# Patient Record
Sex: Female | Born: 1970 | Race: White | Hispanic: No | Smoking: Former smoker
Health system: Southern US, Community
[De-identification: ages and names within clinical notes are randomized; demographics above are authoritative.]

## PROBLEM LIST (undated history)

## (undated) DIAGNOSIS — E559 Vitamin D deficiency, unspecified: Secondary | ICD-10-CM

## (undated) DIAGNOSIS — L4050 Arthropathic psoriasis, unspecified: Secondary | ICD-10-CM

## (undated) DIAGNOSIS — M797 Fibromyalgia: Secondary | ICD-10-CM

## (undated) DIAGNOSIS — M7989 Other specified soft tissue disorders: Secondary | ICD-10-CM

## (undated) DIAGNOSIS — K449 Diaphragmatic hernia without obstruction or gangrene: Secondary | ICD-10-CM

## (undated) DIAGNOSIS — N809 Endometriosis, unspecified: Secondary | ICD-10-CM

## (undated) DIAGNOSIS — E785 Hyperlipidemia, unspecified: Secondary | ICD-10-CM

## (undated) DIAGNOSIS — I10 Essential (primary) hypertension: Secondary | ICD-10-CM

## (undated) DIAGNOSIS — K589 Irritable bowel syndrome without diarrhea: Secondary | ICD-10-CM

## (undated) DIAGNOSIS — M1990 Unspecified osteoarthritis, unspecified site: Secondary | ICD-10-CM

## (undated) DIAGNOSIS — K5190 Ulcerative colitis, unspecified, without complications: Secondary | ICD-10-CM

## (undated) DIAGNOSIS — M549 Dorsalgia, unspecified: Secondary | ICD-10-CM

## (undated) DIAGNOSIS — K219 Gastro-esophageal reflux disease without esophagitis: Secondary | ICD-10-CM

## (undated) DIAGNOSIS — J302 Other seasonal allergic rhinitis: Secondary | ICD-10-CM

## (undated) DIAGNOSIS — M2550 Pain in unspecified joint: Secondary | ICD-10-CM

## (undated) DIAGNOSIS — R112 Nausea with vomiting, unspecified: Secondary | ICD-10-CM

## (undated) DIAGNOSIS — M3500 Sicca syndrome, unspecified: Secondary | ICD-10-CM

## (undated) DIAGNOSIS — F4310 Post-traumatic stress disorder, unspecified: Secondary | ICD-10-CM

## (undated) DIAGNOSIS — H3530 Unspecified macular degeneration: Secondary | ICD-10-CM

## (undated) DIAGNOSIS — F329 Major depressive disorder, single episode, unspecified: Secondary | ICD-10-CM

## (undated) DIAGNOSIS — H269 Unspecified cataract: Secondary | ICD-10-CM

## (undated) DIAGNOSIS — M329 Systemic lupus erythematosus, unspecified: Secondary | ICD-10-CM

## (undated) DIAGNOSIS — F419 Anxiety disorder, unspecified: Secondary | ICD-10-CM

## (undated) DIAGNOSIS — T7840XA Allergy, unspecified, initial encounter: Secondary | ICD-10-CM

## (undated) DIAGNOSIS — F32A Depression, unspecified: Secondary | ICD-10-CM

## (undated) DIAGNOSIS — IMO0002 Reserved for concepts with insufficient information to code with codable children: Secondary | ICD-10-CM

## (undated) DIAGNOSIS — Z9889 Other specified postprocedural states: Secondary | ICD-10-CM

## (undated) DIAGNOSIS — D649 Anemia, unspecified: Secondary | ICD-10-CM

## (undated) DIAGNOSIS — K760 Fatty (change of) liver, not elsewhere classified: Secondary | ICD-10-CM

## (undated) HISTORY — DX: Depression, unspecified: F32.A

## (undated) HISTORY — DX: Anxiety disorder, unspecified: F41.9

## (undated) HISTORY — DX: Other specified soft tissue disorders: M79.89

## (undated) HISTORY — DX: Major depressive disorder, single episode, unspecified: F32.9

## (undated) HISTORY — DX: Other specified postprocedural states: Z98.890

## (undated) HISTORY — DX: Essential (primary) hypertension: I10

## (undated) HISTORY — DX: Fatty (change of) liver, not elsewhere classified: K76.0

## (undated) HISTORY — DX: Unspecified cataract: H26.9

## (undated) HISTORY — DX: Post-traumatic stress disorder, unspecified: F43.10

## (undated) HISTORY — DX: Vitamin D deficiency, unspecified: E55.9

## (undated) HISTORY — DX: Arthropathic psoriasis, unspecified: L40.50

## (undated) HISTORY — DX: Ulcerative colitis, unspecified, without complications: K51.90

## (undated) HISTORY — PX: MOLE REMOVAL: SHX2046

## (undated) HISTORY — DX: Other seasonal allergic rhinitis: J30.2

## (undated) HISTORY — DX: Hyperlipidemia, unspecified: E78.5

## (undated) HISTORY — DX: Dorsalgia, unspecified: M54.9

## (undated) HISTORY — DX: Diaphragmatic hernia without obstruction or gangrene: K44.9

## (undated) HISTORY — DX: Sjogren syndrome, unspecified: M35.00

## (undated) HISTORY — PX: ABDOMINAL HYSTERECTOMY: SHX81

## (undated) HISTORY — DX: Gastro-esophageal reflux disease without esophagitis: K21.9

## (undated) HISTORY — DX: Unspecified osteoarthritis, unspecified site: M19.90

## (undated) HISTORY — DX: Unspecified macular degeneration: H35.30

## (undated) HISTORY — DX: Fibromyalgia: M79.7

## (undated) HISTORY — DX: Anemia, unspecified: D64.9

## (undated) HISTORY — DX: Irritable bowel syndrome, unspecified: K58.9

## (undated) HISTORY — PX: OOPHORECTOMY: SHX86

## (undated) HISTORY — DX: Allergy, unspecified, initial encounter: T78.40XA

## (undated) HISTORY — DX: Pain in unspecified joint: M25.50

## (undated) HISTORY — DX: Nausea with vomiting, unspecified: R11.2

---

## 1998-02-17 ENCOUNTER — Ambulatory Visit (HOSPITAL_COMMUNITY): Admission: RE | Admit: 1998-02-17 | Discharge: 1998-02-17 | Payer: Self-pay | Admitting: *Deleted

## 1998-02-17 ENCOUNTER — Encounter: Payer: Self-pay | Admitting: *Deleted

## 1998-03-17 ENCOUNTER — Ambulatory Visit (HOSPITAL_COMMUNITY): Admission: RE | Admit: 1998-03-17 | Discharge: 1998-03-17 | Payer: Self-pay | Admitting: *Deleted

## 1998-03-17 ENCOUNTER — Encounter: Payer: Self-pay | Admitting: *Deleted

## 1998-05-18 ENCOUNTER — Other Ambulatory Visit: Admission: RE | Admit: 1998-05-18 | Discharge: 1998-05-18 | Payer: Self-pay | Admitting: Obstetrics and Gynecology

## 1999-06-21 ENCOUNTER — Other Ambulatory Visit: Admission: RE | Admit: 1999-06-21 | Discharge: 1999-06-21 | Payer: Self-pay | Admitting: Obstetrics and Gynecology

## 1999-08-19 ENCOUNTER — Encounter: Payer: Self-pay | Admitting: Internal Medicine

## 1999-08-19 ENCOUNTER — Emergency Department (HOSPITAL_COMMUNITY): Admission: EM | Admit: 1999-08-19 | Discharge: 1999-08-19 | Payer: Self-pay | Admitting: Emergency Medicine

## 1999-09-02 ENCOUNTER — Ambulatory Visit (HOSPITAL_COMMUNITY): Admission: RE | Admit: 1999-09-02 | Discharge: 1999-09-02 | Payer: Self-pay | Admitting: Family Medicine

## 1999-09-02 ENCOUNTER — Encounter: Payer: Self-pay | Admitting: Family Medicine

## 2000-01-22 ENCOUNTER — Other Ambulatory Visit: Admission: RE | Admit: 2000-01-22 | Discharge: 2000-01-22 | Payer: Self-pay | Admitting: Obstetrics and Gynecology

## 2000-06-30 ENCOUNTER — Emergency Department (HOSPITAL_COMMUNITY): Admission: EM | Admit: 2000-06-30 | Discharge: 2000-07-01 | Payer: Self-pay | Admitting: Internal Medicine

## 2000-11-17 ENCOUNTER — Other Ambulatory Visit: Admission: RE | Admit: 2000-11-17 | Discharge: 2000-11-17 | Payer: Self-pay | Admitting: Obstetrics and Gynecology

## 2001-12-18 ENCOUNTER — Other Ambulatory Visit: Admission: RE | Admit: 2001-12-18 | Discharge: 2001-12-18 | Payer: Self-pay | Admitting: Obstetrics and Gynecology

## 2002-12-24 ENCOUNTER — Other Ambulatory Visit: Admission: RE | Admit: 2002-12-24 | Discharge: 2002-12-24 | Payer: Self-pay | Admitting: Obstetrics and Gynecology

## 2004-01-17 ENCOUNTER — Other Ambulatory Visit: Admission: RE | Admit: 2004-01-17 | Discharge: 2004-01-17 | Payer: Self-pay | Admitting: Obstetrics and Gynecology

## 2004-11-23 ENCOUNTER — Other Ambulatory Visit: Admission: RE | Admit: 2004-11-23 | Discharge: 2004-11-23 | Payer: Self-pay | Admitting: Obstetrics and Gynecology

## 2005-05-17 ENCOUNTER — Other Ambulatory Visit: Admission: RE | Admit: 2005-05-17 | Discharge: 2005-05-17 | Payer: Self-pay | Admitting: Obstetrics and Gynecology

## 2007-04-16 HISTORY — PX: TONSILLECTOMY: SUR1361

## 2007-04-16 HISTORY — PX: TEMPOROMANDIBULAR JOINT SURGERY: SHX35

## 2008-04-14 ENCOUNTER — Ambulatory Visit (HOSPITAL_COMMUNITY): Admission: RE | Admit: 2008-04-14 | Discharge: 2008-04-14 | Payer: Self-pay | Admitting: Obstetrics and Gynecology

## 2008-04-14 ENCOUNTER — Encounter: Admission: RE | Admit: 2008-04-14 | Discharge: 2008-04-14 | Payer: Self-pay | Admitting: Obstetrics and Gynecology

## 2008-08-16 ENCOUNTER — Other Ambulatory Visit: Payer: Self-pay | Admitting: Obstetrics and Gynecology

## 2009-03-03 ENCOUNTER — Ambulatory Visit (HOSPITAL_COMMUNITY): Admission: RE | Admit: 2009-03-03 | Discharge: 2009-03-03 | Payer: Self-pay | Admitting: Internal Medicine

## 2009-07-14 HISTORY — PX: PARTIAL HYSTERECTOMY: SHX80

## 2009-07-25 ENCOUNTER — Encounter (INDEPENDENT_AMBULATORY_CARE_PROVIDER_SITE_OTHER): Payer: Self-pay | Admitting: Obstetrics and Gynecology

## 2009-07-25 ENCOUNTER — Ambulatory Visit (HOSPITAL_COMMUNITY): Admission: RE | Admit: 2009-07-25 | Discharge: 2009-07-26 | Payer: Self-pay | Admitting: Obstetrics and Gynecology

## 2009-08-12 ENCOUNTER — Observation Stay (HOSPITAL_COMMUNITY)
Admission: AD | Admit: 2009-08-12 | Discharge: 2009-08-13 | Payer: Self-pay | Source: Home / Self Care | Admitting: Obstetrics and Gynecology

## 2009-12-31 ENCOUNTER — Inpatient Hospital Stay (HOSPITAL_COMMUNITY)
Admission: AD | Admit: 2009-12-31 | Discharge: 2009-12-31 | Payer: Self-pay | Source: Home / Self Care | Admitting: Obstetrics and Gynecology

## 2010-04-13 ENCOUNTER — Encounter (INDEPENDENT_AMBULATORY_CARE_PROVIDER_SITE_OTHER): Payer: Self-pay | Admitting: Obstetrics and Gynecology

## 2010-04-13 ENCOUNTER — Ambulatory Visit (HOSPITAL_COMMUNITY)
Admission: RE | Admit: 2010-04-13 | Discharge: 2010-04-13 | Payer: Self-pay | Source: Home / Self Care | Attending: Obstetrics and Gynecology | Admitting: Obstetrics and Gynecology

## 2010-04-22 ENCOUNTER — Inpatient Hospital Stay (HOSPITAL_COMMUNITY)
Admission: AD | Admit: 2010-04-22 | Discharge: 2010-04-22 | Payer: Self-pay | Source: Home / Self Care | Attending: Obstetrics and Gynecology | Admitting: Obstetrics and Gynecology

## 2010-04-30 LAB — COMPREHENSIVE METABOLIC PANEL
ALT: 16 U/L (ref 0–35)
AST: 16 U/L (ref 0–37)
Albumin: 3.9 g/dL (ref 3.5–5.2)
Alkaline Phosphatase: 49 U/L (ref 39–117)
BUN: 8 mg/dL (ref 6–23)
CO2: 25 mEq/L (ref 19–32)
Calcium: 9.3 mg/dL (ref 8.4–10.5)
Chloride: 106 mEq/L (ref 96–112)
Creatinine, Ser: 0.5 mg/dL (ref 0.4–1.2)
GFR calc Af Amer: >60 mL/min (ref >60)
GFR calc non Af Amer: >60 mL/min (ref >60)
Glucose, Bld: 81 mg/dL (ref 70–99)
Potassium: 4.1 mEq/L (ref 3.5–5.1)
Sodium: 137 mEq/L (ref 135–145)
Total Bilirubin: 0.6 mg/dL (ref 0.3–1.2)
Total Protein: 6.5 g/dL (ref 6.0–8.3)

## 2010-04-30 LAB — URINE CULTURE
Colony Count: 15000
Culture  Setup Time: 201201082210

## 2010-04-30 LAB — URINALYSIS, ROUTINE W REFLEX MICROSCOPIC
Bilirubin Urine: NEGATIVE
Ketones, ur: NEGATIVE mg/dL
Nitrite: NEGATIVE
Protein, ur: NEGATIVE mg/dL
Specific Gravity, Urine: 1.025 (ref 1.005–1.030)
Urine Glucose, Fasting: NEGATIVE mg/dL
Urobilinogen, UA: 0.2 mg/dL (ref 0.0–1.0)
pH: 6 (ref 5.0–8.0)

## 2010-04-30 LAB — DIFFERENTIAL
Basophils Absolute: 0.2 10*3/uL — ABNORMAL HIGH (ref 0.0–0.1)
Basophils Relative: 2 % — ABNORMAL HIGH (ref 0–1)
Eosinophils Absolute: 0.3 10*3/uL (ref 0.0–0.7)
Eosinophils Relative: 3 % (ref 0–5)
Lymphocytes Relative: 22 % (ref 12–46)
Lymphs Abs: 2 10*3/uL (ref 0.7–4.0)
Monocytes Absolute: 0.5 10*3/uL (ref 0.1–1.0)
Monocytes Relative: 5 % (ref 3–12)
Neutro Abs: 6 10*3/uL (ref 1.7–7.7)
Neutrophils Relative %: 68 % (ref 43–77)

## 2010-04-30 LAB — CBC
HCT: 42.3 % (ref 36.0–46.0)
Hemoglobin: 14 g/dL (ref 12.0–15.0)
MCH: 27.5 pg (ref 26.0–34.0)
MCHC: 33.1 g/dL (ref 30.0–36.0)
MCV: 83.1 fL (ref 78.0–100.0)
Platelets: 174 10*3/uL (ref 150–400)
RBC: 5.09 MIL/uL (ref 3.87–5.11)
RDW: 13.6 % (ref 11.5–15.5)
WBC: 8.8 10*3/uL (ref 4.0–10.5)

## 2010-04-30 LAB — AMYLASE: Amylase: 89 U/L (ref 0–105)

## 2010-04-30 LAB — URINE MICROSCOPIC-ADD ON

## 2010-04-30 LAB — LIPASE, BLOOD: Lipase: 35 U/L (ref 11–59)

## 2010-06-22 ENCOUNTER — Encounter (HOSPITAL_COMMUNITY)
Admission: RE | Admit: 2010-06-22 | Discharge: 2010-06-22 | Disposition: A | Discharge status: Home / Self Care | Payer: PRIVATE HEALTH INSURANCE | Source: Ambulatory Visit | Attending: Obstetrics and Gynecology | Admitting: Obstetrics and Gynecology

## 2010-06-22 DIAGNOSIS — Z01812 Encounter for preprocedural laboratory examination: Secondary | ICD-10-CM | POA: Insufficient documentation

## 2010-06-22 LAB — BASIC METABOLIC PANEL
BUN: 10 mg/dL (ref 6–23)
CO2: 29 mEq/L (ref 19–32)
Calcium: 9.4 mg/dL (ref 8.4–10.5)
Chloride: 103 mEq/L (ref 96–112)
Creatinine, Ser: 0.72 mg/dL (ref 0.4–1.2)
GFR calc Af Amer: >60 mL/min (ref >60)
GFR calc non Af Amer: >60 mL/min (ref >60)
Glucose, Bld: 77 mg/dL (ref 70–99)
Potassium: 3.9 mEq/L (ref 3.5–5.1)
Sodium: 137 mEq/L (ref 135–145)

## 2010-06-22 LAB — CBC
HCT: 43.4 % (ref 36.0–46.0)
Hemoglobin: 14.2 g/dL (ref 12.0–15.0)
MCH: 27.7 pg (ref 26.0–34.0)
MCHC: 32.7 g/dL (ref 30.0–36.0)
MCV: 84.8 fL (ref 78.0–100.0)
Platelets: 129 10*3/uL — ABNORMAL LOW (ref 150–400)
RBC: 5.12 MIL/uL — ABNORMAL HIGH (ref 3.87–5.11)
RDW: 14 % (ref 11.5–15.5)
WBC: 6.6 10*3/uL (ref 4.0–10.5)

## 2010-06-22 LAB — SURGICAL PCR SCREEN
MRSA, PCR: NEGATIVE
Staphylococcus aureus: NEGATIVE

## 2010-06-25 ENCOUNTER — Ambulatory Visit (HOSPITAL_COMMUNITY)
Admission: RE | Admit: 2010-06-25 | Discharge: 2010-06-25 | Disposition: A | Discharge status: Home / Self Care | Payer: PRIVATE HEALTH INSURANCE | Source: Ambulatory Visit | Attending: Obstetrics and Gynecology | Admitting: Obstetrics and Gynecology

## 2010-06-25 ENCOUNTER — Other Ambulatory Visit: Payer: Self-pay | Admitting: Obstetrics and Gynecology

## 2010-06-25 DIAGNOSIS — N750 Cyst of Bartholin's gland: Secondary | ICD-10-CM | POA: Insufficient documentation

## 2010-06-25 DIAGNOSIS — Z90710 Acquired absence of both cervix and uterus: Secondary | ICD-10-CM | POA: Insufficient documentation

## 2010-06-25 DIAGNOSIS — N949 Unspecified condition associated with female genital organs and menstrual cycle: Secondary | ICD-10-CM | POA: Insufficient documentation

## 2010-06-25 DIAGNOSIS — N803 Endometriosis of pelvic peritoneum, unspecified: Secondary | ICD-10-CM | POA: Insufficient documentation

## 2010-06-25 DIAGNOSIS — N8300 Follicular cyst of ovary, unspecified side: Secondary | ICD-10-CM | POA: Insufficient documentation

## 2010-06-25 LAB — CBC
MCH: 27.2 pg (ref 26.0–34.0)
MCHC: 32.2 g/dL (ref 30.0–36.0)
Platelets: 149 10*3/uL — ABNORMAL LOW (ref 150–400)
RBC: 4.72 MIL/uL (ref 3.87–5.11)
RDW: 13.5 % (ref 11.5–15.5)
RDW: 13.6 % (ref 11.5–15.5)
WBC: 9.2 10*3/uL (ref 4.0–10.5)

## 2010-06-25 LAB — SURGICAL PCR SCREEN
MRSA, PCR: NEGATIVE
Staphylococcus aureus: NEGATIVE

## 2010-06-25 NOTE — H&P (Addendum)
NAME:  Melinda Porter, Melinda Porter                ACCOUNT NO.:  [id number]  MEDICAL RECORD NO.:  [id number]           PATIENT TYPE:  O  LOCATION:  [facility]                           FACILITY:  [facility]  PHYSICIAN:  Ralene Bathe. Matthew Saras, M.D.DATE OF BIRTH:  [birth year]  DATE OF ADMISSION:  06/22/2010 DATE OF DISCHARGE:  06/22/2010                             HISTORY & PHYSICAL   CHIEF COMPLAINT:  Cyst and chronic pelvic pain.  HISTORY OF PRESENT ILLNESS:  A 40 year old, G1, P1.  The patient underwent LAVH on July 25, 2009, for menorrhagia and dysmenorrhea with a history of fibroids.  She did well initially after surgery, but began to experience ongoing significant pelvic pain and followup evaluation by ultrasound in an attempt at pain management.  She underwent a repeat laparoscopic exam on April 13, 2010, that showed bilaterally adnexal incision, new pelvic endometriosis.  She underwent RSO and coagulation of circumferential vaginal cuff endometriosis.  Initially, she did make her improvement but had substantial narcotic requirements after that surgery and continued to have substantial pelvic pain.  We got her approved for Lupron.  During the course of the Lupron treatment, she continued to have substantial pelvic pain with inability to manage her pain with nonsteroidal medicines alone, again requiring significant narcotics for pain management.  Within this past month due to the severity of her pain, she underwent followup CT scan and has had normal findings.  We discussed several options with her including followup laparoscopy with removal of remaining ovary, continuing on with Lupron, referral to pain clinic for alternative pain management and to wean off narcotics.  She prefers to have removal of her remaining ovary, although she understands she may still require other treatments of pelvic pain if it persists other than narcotic management.  In the interim, she has persistent Bartholin cyst and wants  to have marsupialization of the Bartholin cyst at this time.  PAST MEDICAL HISTORY:  ALLERGIES:  CODEINE, SULFA, ASPIRIN, PENICILLIN, MORPHINE, PERCOCET.  CURRENT MEDICATIONS: 1. Tylox p.r.n. pain. 2. Motrin 800 mg 1 p.o. q.12 h. p.r.n. pain. 3. Wellbutrin. 4. Klonopin.  SURGICAL HISTORY:  Tonsillectomy, LAVH, RSO with laparoscopy and Cesarean section.  REVIEW OF SYSTEMS:  Significant for history of thyroid disease, IBS, and prior history of LEEPs.  FAMILY HISTORY:  Notable for hypertension, diabetes, arthritis, osteoarthrosis, anemia, hiatal hernia, asthma, heart disease, and migraine headache.  PHYSICAL EXAMINATION:  VITAL SIGNS:  Temperature 98.2, blood pressure 120/78. HEENT:  Unremarkable. NECK:  Supple without masses. LUNGS:  Clear. CARDIOVASCULAR:  Regular rate and rhythm without murmurs, rubs, gallops. No AFib. CHEST:  Without masses. ABDOMEN:  Soft, flat, nondistended. PELVIC:  Normal external genitalia.  Vaginal cuff clear.  On bimanual exam, no significant tenderness or masses noted. EXTREMITIES:  Unremarkable. NEUROLOGIC:  Unremarkable.  IMPRESSION: 1. Persistent pelvic pain, prior history of laparoscopic-assisted     vaginal hysterectomy and right salpingo-oophorectomy on separate     occasions with findings of endometriosis and adnexal adhesions. 2. Recurrent Bartholin cyst, on the right.  PLAN:  Marsupialization of right Bartholin cyst, laparoscopy with BSO, remaining with left ovary.  This procedure including risk  related to bleeding, infection, adjacent organ injury, the possible need to complete the surgery with open technique or persistence of pelvic pain necessitating other management strategies have all been reviewed with her.  All of her questions were answered.     Evani Shrider M. Matthew Saras, [name]     RMH/MEDQ  D:  06/23/2010  T:  06/23/2010  Job:  [id number]  Electronically Signed by Molli Posey [name] on 07/02/2010 10:16:16 PM

## 2010-06-28 LAB — URINE MICROSCOPIC-ADD ON

## 2010-06-28 LAB — URINALYSIS, ROUTINE W REFLEX MICROSCOPIC
Bilirubin Urine: NEGATIVE
Ketones, ur: NEGATIVE mg/dL
Nitrite: NEGATIVE
Specific Gravity, Urine: 1.025 (ref 1.005–1.030)
Urobilinogen, UA: 0.2 mg/dL (ref 0.0–1.0)
pH: 6 (ref 5.0–8.0)

## 2010-06-28 LAB — CBC
Hemoglobin: 13.2 g/dL (ref 12.0–15.0)
MCH: 29.3 pg (ref 26.0–34.0)
MCHC: 33.8 g/dL (ref 30.0–36.0)
MCV: 86.8 fL (ref 78.0–100.0)
RBC: 4.5 MIL/uL (ref 3.87–5.11)

## 2010-06-28 LAB — URINE CULTURE: Colony Count: 15000

## 2010-07-01 ENCOUNTER — Observation Stay (HOSPITAL_COMMUNITY)
Admission: AD | Admit: 2010-07-01 | Discharge: 2010-07-01 | Disposition: A | Discharge status: Home / Self Care | Payer: PRIVATE HEALTH INSURANCE | Source: Ambulatory Visit | Attending: Obstetrics and Gynecology | Admitting: Obstetrics and Gynecology

## 2010-07-01 ENCOUNTER — Inpatient Hospital Stay (HOSPITAL_COMMUNITY): Payer: PRIVATE HEALTH INSURANCE

## 2010-07-01 DIAGNOSIS — K5900 Constipation, unspecified: Secondary | ICD-10-CM

## 2010-07-01 DIAGNOSIS — Z90710 Acquired absence of both cervix and uterus: Secondary | ICD-10-CM | POA: Insufficient documentation

## 2010-07-01 DIAGNOSIS — R109 Unspecified abdominal pain: Secondary | ICD-10-CM

## 2010-07-01 DIAGNOSIS — N949 Unspecified condition associated with female genital organs and menstrual cycle: Secondary | ICD-10-CM

## 2010-07-01 LAB — URINE MICROSCOPIC-ADD ON

## 2010-07-01 LAB — COMPREHENSIVE METABOLIC PANEL
AST: 27 U/L (ref 0–37)
BUN: 17 mg/dL (ref 6–23)
CO2: 23 mEq/L (ref 19–32)
Calcium: 9.7 mg/dL (ref 8.4–10.5)
Chloride: 108 mEq/L (ref 96–112)
Creatinine, Ser: 0.62 mg/dL (ref 0.4–1.2)
GFR calc non Af Amer: >60 mL/min (ref >60)
Glucose, Bld: 137 mg/dL — ABNORMAL HIGH (ref 70–99)
Total Bilirubin: 0.7 mg/dL (ref 0.3–1.2)

## 2010-07-01 LAB — URINALYSIS, ROUTINE W REFLEX MICROSCOPIC
Nitrite: NEGATIVE
Protein, ur: NEGATIVE mg/dL
Specific Gravity, Urine: 1.025 (ref 1.005–1.030)
Urobilinogen, UA: 1 mg/dL (ref 0.0–1.0)

## 2010-07-01 LAB — CBC
Hemoglobin: 14.3 g/dL (ref 12.0–15.0)
MCHC: 33.3 g/dL (ref 30.0–36.0)
RBC: 5.09 MIL/uL (ref 3.87–5.11)
WBC: 13.2 10*3/uL — ABNORMAL HIGH (ref 4.0–10.5)

## 2010-07-02 NOTE — Op Note (Addendum)
NAME:  Melinda Porter, BUTTERBAUGH                ACCOUNT NO.:  [id number]  MEDICAL RECORD NO.:  [id number]           PATIENT TYPE:  O  LOCATION:  [facility]                           FACILITY:  [facility]  PHYSICIAN:  Ralene Bathe. Matthew Saras, M.D.DATE OF BIRTH:  1970/06/01  DATE OF PROCEDURE: DATE OF DISCHARGE:  06/22/2010                              OPERATIVE REPORT   PREOPERATIVE DIAGNOSES: 1. Chronic right Bartholin cyst/abscess. 2. Acute on chronic pelvic pain. 3. Pelvic endometriosis.  POSTOPERATIVE DIAGNOSES: 1. Chronic right Bartholin cyst/abscess. 2. Acute on chronic pelvic pain. 3. Pelvic endometriosis.  PROCEDURES: 1. Marsupialization, right Bartholin cyst/abscess. 2. Diagnostic laparoscopy with left salpingo-oophorectomy.  SURGEON:  Ralene Bathe. Matthew Saras, [name]  ANESTHESIA:  General endotracheal.  COMPLICATIONS:  None.  DRAINS:  Foley catheter.  BLOOD LOSS:  Minimal.  PROCEDURE AND FINDINGS:  The patient was taken to the operating room.After an adequate level of general endotracheal anesthesia was obtained with the patient's legs in stirrups, the abdomen, perineum, and vagina were prepped and draped in usual manner for laparoscopy.  The bladder was drained, UA carried out, and uterus surgically absent.  The right ovary was surgically absent.  I could not palpate the left ovary.  There was a 3-cm enlarged right Bartholin cyst noted.  The Bartholin cyst was infiltrated with Xylocaine.  A 11-blade was used to make an incision, drainage of mucopurulent material.  The cyst wall was explored.  There were no loculations.  Marsupialization was carried out then by suturing the cyst wall to the surrounding skin in a circumferential manner with 2- 0 Vicryl sutures.  Attention directed to the abdomen where the subumbilical area was infiltrated with 0.25% Marcaine plain.  A small incision was made, and the Veress needle was introduced without difficulty.  It's intra-abdominal position was verified by  pressure and water testing.  After a 3 L pneumoperitoneum was created, laparoscopic trocar and sleeve were then introduced without difficulty.  Three fingerbreadths above the symphysis in the midline, a 10/11 trocar was then inserted under direct visualization.  The patient was then placed in Trendelenburg and pelvic findings as follows.  Upper abdomen unremarkable.  The uterus and right ovary were surgically absent.  There were some brownish implants of endometriosis along the vaginal cuff area as noted on prior laparoscopy.  There was a clear walled cyst on the left side, but no other significant findings involving the left tube and ovary.  A grasper was then used to place the left tube and ovary on traction to the midline.  The course of the left pelvic ureter was noted to be well below.  The EnSeal device was then used to coagulate and divide the infundibulopelvic ligament close to the ovary until it was released.  This area was hemostatic.  This was morcellated into two to three pieces for removal through the lower incision.  The operative site was inspected and noted to be hemostatic. Prior to closure, sponge, needle, and instrument counts reported as correct x2.  The lower fascia was closed with 2-0 Vicryl running suture and a subcuticular 4-0 on the skin; 4-0 subcuticular on the subumbilical incision.  She tolerated this well, went to recovery room in good condition.     Katrece Roediger M. Matthew Saras, [name]     RMH/MEDQ  D:  06/25/2010  T:  06/26/2010  Job:  [id number]  Electronically Signed by Molli Posey [name] on 07/02/2010 10:16:22 PM

## 2010-07-03 LAB — CBC
HCT: 30.3 % — ABNORMAL LOW (ref 36.0–46.0)
Hemoglobin: 10.4 g/dL — ABNORMAL LOW (ref 12.0–15.0)
MCHC: 33.8 g/dL (ref 30.0–36.0)
MCV: 84 fL (ref 78.0–100.0)
MCV: 83.2 fL (ref 78.0–100.0)
Platelets: 266 10*3/uL (ref 150–400)
RBC: 3.69 MIL/uL — ABNORMAL LOW (ref 3.87–5.11)
RBC: 3.64 MIL/uL — ABNORMAL LOW (ref 3.87–5.11)
WBC: 8.4 10*3/uL (ref 4.0–10.5)
WBC: 8.5 10*3/uL (ref 4.0–10.5)

## 2010-07-04 LAB — COMPREHENSIVE METABOLIC PANEL
BUN: 9 mg/dL (ref 6–23)
Calcium: 9.6 mg/dL (ref 8.4–10.5)
Glucose, Bld: 85 mg/dL (ref 70–99)
Total Protein: 7.1 g/dL (ref 6.0–8.3)

## 2010-07-04 LAB — CBC
HCT: 44 % (ref 36.0–46.0)
Hemoglobin: 14.5 g/dL (ref 12.0–15.0)
MCHC: 33 g/dL (ref 30.0–36.0)
Platelets: 160 10*3/uL (ref 150–400)
RBC: 3.19 MIL/uL — ABNORMAL LOW (ref 3.87–5.11)
RDW: 13.9 % (ref 11.5–15.5)
WBC: 11.6 10*3/uL — ABNORMAL HIGH (ref 4.0–10.5)

## 2010-07-04 LAB — TYPE AND SCREEN
ABO/RH(D): O POS
Antibody Screen: NEGATIVE

## 2010-07-24 LAB — COMPREHENSIVE METABOLIC PANEL
ALT: 15 U/L (ref 0–35)
Alkaline Phosphatase: 43 U/L (ref 39–117)
CO2: 28 mEq/L (ref 19–32)
Glucose, Bld: 92 mg/dL (ref 70–99)
Potassium: 3.9 mEq/L (ref 3.5–5.1)
Sodium: 137 mEq/L (ref 135–145)
Total Protein: 6.3 g/dL (ref 6.0–8.3)

## 2010-07-24 LAB — CBC
Hemoglobin: 14.8 g/dL (ref 12.0–15.0)
RBC: 5.01 MIL/uL (ref 3.87–5.11)
RDW: 13.7 % (ref 11.5–15.5)
WBC: 7.6 10*3/uL (ref 4.0–10.5)

## 2010-07-24 LAB — ABO/RH: ABO/RH(D): O POS

## 2010-07-24 LAB — PREGNANCY, URINE: Preg Test, Ur: NEGATIVE

## 2010-07-24 LAB — TYPE AND SCREEN: ABO/RH(D): O POS

## 2010-08-31 NOTE — H&P (Signed)
NAME:  Melinda Porter, THAXTON                ACCOUNT NO.:  [id number]   MEDICAL RECORD NO.:  [id number]          PATIENT TYPE:  AMB   LOCATION:                                [facility]ILITY:  [facility]   PHYSICIAN:  Ralene Bathe. Matthew Saras, M.D.DATE OF BIRTH:  30-Sep-1970   DATE OF ADMISSION:  08/17/2008  DATE OF DISCHARGE:                              HISTORY & PHYSICAL   DATE OF SCHEDULED SURGERY:  Aug 17, 2008.   MEDICAL DOCTOR:  Emeline General. White, MD   CHIEF COMPLAINT:  Menorrhagia, dysmenorrhea.   HISTORY OF PRESENT ILLNESS:  A 40 year old G1, P1 using condoms for  contraception who has a 6-49-monthhistory of menorrhagia and worsening  dysmenorrhea.  Ultrasound evaluation in our office December 2009 showed  a fundal fibroid 3.5 x 4 x 3.5 cm.  Due to continued problems with pain  and bleeding, she presents at this time for LAVH.  This procedure  including risks related to bleeding, infection, adjacent organ injury,  the possible need for open additional surgery all reviewed with her  which she understands and accepts.   Last Pap April 2010 was normal.   PAST MEDICAL HISTORY:   ALLERGIES:  CODEINE, SULFA, ASPIRIN, PENICILLIN, MORPHINE, and PERCOCET.   CURRENT MEDICATIONS:  Klonopin 0.5 mg p.r.n.   PRIOR SURGERIES:  Tonsillectomy, TMJ surgery, cesarean section.   REVIEW OF SYSTEMS:  Significant for history of thyroid disease, IBS,  prior history of abnormal Pap smear treated by a LEEP in 2006.   FAMILY HISTORY:  Significant for history of hypertension, diabetes,  arthritis, osteoporosis, anemia, hiatal hernia, asthma, heart disease,  and migraine headache.   SOCIAL HISTORY:  Denies drug or cigarette use.  She does state a  positive alcohol use 1-2 drinks per week.   PHYSICAL EXAMINATION:  VITAL SIGNS:  Temp 98.2, BP 118/98.  HEENT:  Unremarkable.  NECK:  Supple without masses.  LUNGS:  Clear.  CARDIOVASCULAR:  Regular rate and rhythm without murmurs, rubs, or  gallops.  No AFib.  BREASTS:   Without masses.  ABDOMEN:  Soft, flat, and nontender.  PELVIC:  Normal external genitalia.  Vagina swabs clear.  Uterus  midposition, upper limit of normal size.  Adnexa negative.  EXTREMITIES:  Unremarkable.  NEUROLOGIC:  Unremarkable.   IMPRESSION:  1. Dysmenorrhea with menorrhagia.  2. Leiomyoma.  3. History of loop electrocautery excision procedure 2006, subsequent      normal Pap smears.   PLAN:  LAVH procedure and risks reviewed as above.      [name] [name]  Electronically Signed     RMH/MEDQ  D:  08/10/2008  T:  08/11/2008  Job:  [id number]

## 2011-01-15 ENCOUNTER — Other Ambulatory Visit: Payer: Self-pay | Admitting: Family Medicine

## 2011-01-15 DIAGNOSIS — R51 Headache: Secondary | ICD-10-CM

## 2011-01-17 ENCOUNTER — Other Ambulatory Visit: Payer: PRIVATE HEALTH INSURANCE

## 2011-09-06 ENCOUNTER — Other Ambulatory Visit: Payer: Self-pay | Admitting: Obstetrics and Gynecology

## 2011-09-06 DIAGNOSIS — N6320 Unspecified lump in the left breast, unspecified quadrant: Secondary | ICD-10-CM

## 2011-09-10 ENCOUNTER — Ambulatory Visit (INDEPENDENT_AMBULATORY_CARE_PROVIDER_SITE_OTHER): Payer: Self-pay | Admitting: *Deleted

## 2011-09-10 VITALS — BP 135/92 | HR 96 | Temp 97.0°F | Ht 64.0 in | Wt 211.5 lb

## 2011-09-10 DIAGNOSIS — Z1239 Encounter for other screening for malignant neoplasm of breast: Secondary | ICD-10-CM

## 2011-09-10 DIAGNOSIS — N63 Unspecified lump in unspecified breast: Secondary | ICD-10-CM

## 2011-09-10 DIAGNOSIS — N6325 Unspecified lump in the left breast, overlapping quadrants: Secondary | ICD-10-CM

## 2011-09-10 DIAGNOSIS — N6324 Unspecified lump in the left breast, lower inner quadrant: Secondary | ICD-10-CM

## 2011-09-10 NOTE — Patient Instructions (Signed)
Taught patient how to perform BSE. Patient did not need a Pap smear today due to last Pap smear was May 2012 per patient and since patient has a history of a hysterectomy. Let patient know she will not need any further Pap smears since has a history of a hysterectomy. Patient referred to the [facility] for Diagnostic Mammogram and possible left breast ultrasound. Appointment scheduled for Monday, September 16, 2011 at 1330.  Patient aware of appointment and will be there. Let patient know will follow up with her within the next couple weeks with results. Patient verbalized understanding.

## 2011-09-10 NOTE — Progress Notes (Signed)
Complaints of left breast lumps that are painful with touch x 2 months  Pap Smear:    Pap smear not performed today. Per patient last Pap smear was May 2012 and normal. Per patient she has had one abnormal Pap smear in 2006 that required a LEEP. Patient has a history of a partial hysterectomy in April 2011, removal of 1 ovary in December 2011 and removal of the other ovary April 2012 due to fibroids and endometriosis. Patient stated she has had normal Pap smears since LEEP. No Pap smear results in EPIC.  Physical exam: Breasts Right breast larger than left breast normal per patient normal for her. No skin abnormalities bilateral breasts. No nipple retraction bilateral breasts. No nipple discharge bilateral breasts. No lymphadenopathy. No lumps palpated right breast. Palpated two small moveable lumps in the left breast at 6 o'clock 3 cm from the areola and at 7 o'clock 3.5 cm from the areola. Patient complained of tenderness when palpated lumps. Patient referred to the [facility] for Diagnostic Mammogram and possible left breast ultrasound. Appointment scheduled for Monday, September 16, 2011 at 1330.        Pelvic/Bimanual No Pap smear completed today since last Pap smear was May 2012 and patient has a history of a complete hysterectomy. Pap smear not indicated per BCCCP guidelines.

## 2011-09-16 ENCOUNTER — Ambulatory Visit
Admission: RE | Admit: 2011-09-16 | Discharge: 2011-09-16 | Disposition: A | Discharge status: Home / Self Care | Payer: No Typology Code available for payment source | Source: Ambulatory Visit | Attending: Obstetrics and Gynecology | Admitting: Obstetrics and Gynecology

## 2011-09-16 DIAGNOSIS — N6320 Unspecified lump in the left breast, unspecified quadrant: Secondary | ICD-10-CM

## 2011-11-28 ENCOUNTER — Emergency Department (HOSPITAL_BASED_OUTPATIENT_CLINIC_OR_DEPARTMENT_OTHER): Payer: Self-pay

## 2011-11-28 ENCOUNTER — Encounter (HOSPITAL_BASED_OUTPATIENT_CLINIC_OR_DEPARTMENT_OTHER): Payer: Self-pay | Admitting: *Deleted

## 2011-11-28 ENCOUNTER — Emergency Department (HOSPITAL_BASED_OUTPATIENT_CLINIC_OR_DEPARTMENT_OTHER)
Admission: EM | Admit: 2011-11-28 | Discharge: 2011-11-28 | Disposition: A | Discharge status: Home / Self Care | Payer: Self-pay | Attending: Emergency Medicine | Admitting: Emergency Medicine

## 2011-11-28 DIAGNOSIS — F329 Major depressive disorder, single episode, unspecified: Secondary | ICD-10-CM | POA: Insufficient documentation

## 2011-11-28 DIAGNOSIS — Z79899 Other long term (current) drug therapy: Secondary | ICD-10-CM | POA: Insufficient documentation

## 2011-11-28 DIAGNOSIS — R109 Unspecified abdominal pain: Secondary | ICD-10-CM

## 2011-11-28 DIAGNOSIS — M549 Dorsalgia, unspecified: Secondary | ICD-10-CM | POA: Insufficient documentation

## 2011-11-28 DIAGNOSIS — F3289 Other specified depressive episodes: Secondary | ICD-10-CM | POA: Insufficient documentation

## 2011-11-28 DIAGNOSIS — I10 Essential (primary) hypertension: Secondary | ICD-10-CM | POA: Insufficient documentation

## 2011-11-28 DIAGNOSIS — K219 Gastro-esophageal reflux disease without esophagitis: Secondary | ICD-10-CM

## 2011-11-28 DIAGNOSIS — R079 Chest pain, unspecified: Secondary | ICD-10-CM | POA: Insufficient documentation

## 2011-11-28 HISTORY — DX: Endometriosis, unspecified: N80.9

## 2011-11-28 LAB — COMPREHENSIVE METABOLIC PANEL
AST: 15 U/L (ref 0–37)
BUN: 8 mg/dL (ref 6–23)
CO2: 28 mEq/L (ref 19–32)
Chloride: 103 mEq/L (ref 96–112)
Creatinine, Ser: 0.6 mg/dL (ref 0.50–1.10)
GFR calc Af Amer: >90 mL/min (ref >90)
GFR calc non Af Amer: >90 mL/min (ref >90)
Glucose, Bld: 92 mg/dL (ref 70–99)
Total Bilirubin: 0.4 mg/dL (ref 0.3–1.2)

## 2011-11-28 LAB — CBC WITH DIFFERENTIAL/PLATELET
Basophils Absolute: 0.2 10*3/uL — ABNORMAL HIGH (ref 0.0–0.1)
HCT: 37.8 % (ref 36.0–46.0)
Hemoglobin: 12.5 g/dL (ref 12.0–15.0)
Lymphocytes Relative: 22 % (ref 12–46)
Lymphs Abs: 2.3 10*3/uL (ref 0.7–4.0)
MCV: 79.9 fL (ref 78.0–100.0)
Monocytes Absolute: 0.6 10*3/uL (ref 0.1–1.0)
Monocytes Relative: 6 % (ref 3–12)
Neutro Abs: 6.8 10*3/uL (ref 1.7–7.7)
RBC: 4.73 MIL/uL (ref 3.87–5.11)
WBC: 10.1 10*3/uL (ref 4.0–10.5)

## 2011-11-28 LAB — URINALYSIS, ROUTINE W REFLEX MICROSCOPIC
Glucose, UA: NEGATIVE mg/dL
Hgb urine dipstick: NEGATIVE
Ketones, ur: NEGATIVE mg/dL
Leukocytes, UA: NEGATIVE
Protein, ur: NEGATIVE mg/dL

## 2011-11-28 MED ORDER — GI COCKTAIL ~~LOC~~
30.0000 mL | Freq: Once | ORAL | Status: AC
  Administered 2011-11-28: 30 mL via ORAL
  Filled 2011-11-28: qty 30

## 2011-11-28 MED ORDER — OXYCODONE-ACETAMINOPHEN 5-325 MG PO TABS
2.0000 | ORAL_TABLET | Freq: Once | ORAL | Status: AC
  Administered 2011-11-28: 2 via ORAL
  Filled 2011-11-28 (×2): qty 2

## 2011-11-28 MED ORDER — SODIUM CHLORIDE 0.9 % IV BOLUS (SEPSIS)
1000.0000 mL | Freq: Once | INTRAVENOUS | Status: AC
  Administered 2011-11-28: 1000 mL via INTRAVENOUS

## 2011-11-28 MED ORDER — ONDANSETRON 8 MG PO TBDP: 8.0000 mg | ORAL_TABLET | Freq: Three times a day (TID) | ORAL | Status: AC | PRN

## 2011-11-28 MED ORDER — OXYCODONE-ACETAMINOPHEN 5-325 MG PO TABS: ORAL_TABLET | ORAL | Status: DC

## 2011-11-28 MED ORDER — HYDROMORPHONE HCL PF 1 MG/ML IJ SOLN
1.0000 mg | Freq: Once | INTRAMUSCULAR | Status: AC
  Administered 2011-11-28: 1 mg via INTRAVENOUS
  Filled 2011-11-28: qty 1

## 2011-11-28 MED ORDER — ONDANSETRON HCL 4 MG/2ML IJ SOLN
4.0000 mg | Freq: Once | INTRAMUSCULAR | Status: AC
  Administered 2011-11-28: 4 mg via INTRAVENOUS
  Filled 2011-11-28: qty 2

## 2011-11-28 NOTE — ED Notes (Signed)
Patient states she has had gallbladder attack off and on since Aug 27, 3011.  States she has had an increase in weight, pain under her right breast radiating around chest, nausea and diarrhea.

## 2011-11-28 NOTE — ED Provider Notes (Signed)
History     CSN: [id number]  Arrival date & time 11/28/11  1249   First MD Initiated Contact with Patient 11/28/11 1301      Chief Complaint  Patient presents with  . Abdominal Pain    (Consider location/radiation/quality/duration/timing/severity/associated sxs/prior treatment) HPI Melinda Porter is a 41 year old female who presents today complaining of several months of pain in her right upper quadrant of the abdomen that now radiates all the way around her back. Patient reports that she has not seen a physician for this given that she has no insurance currently. She endorses nausea but denies any vomiting. Patient also endorses having loose bowel movements up to 8 or 9 times a day. She denies any blood in her stools. Patient has history of problems with her gallbladder in the past and reports that this feels similarly. Past surgical history has included cesarean sections as well as total abdominal hysterectomy. Patient has not had a cholecystectomy. She reports that following initial partial hysterectomy patient had oophorrectomy for increasing frequency of large ovarian cysts at that time was discovered to have signs of endometriosis. She reports that her pain is not consistent with prior episodes of endometriosis. Patient also has history of small bowel obstruction secondary to ileus her medications but this was managed medically. Patient denies any urinary symptoms or vaginal discharge at this time. She denies any back pain aside from radiation from the right upper quadrant. She reports she has history of elevated liver enzymes but denies known definite diagnosis of liver disease or pancreatitis. Patient also noted that she had had some chest pain this mainly appeared to be radiating from her epigastrium and burning in character. She did note occasional sharp sensation as well the. There are no other associated or modifying factors. Past Medical History  Diagnosis Date  . PTSD (post-traumatic  stress disorder)   . Hypertension   . Depression   . Hyperlipemia   . Endometriosis     Past Surgical History  Procedure Date  . Partial hysterectomy   . Tonsillectomy   . Cesarean section   . Temporomandibular joint surgery     right side  . Oophorectomy     bilateral     Family History  Problem Relation Age of Onset  . Diabetes Maternal Grandfather   . Heart disease Maternal Grandfather   . Hypertension Maternal Grandfather     History  Substance Use Topics  . Smoking status: Never Smoker   . Smokeless tobacco: Never Used  . Alcohol Use: No    OB History    Grav Para Term Preterm Abortions TAB SAB Ect Mult Living   2 1   1 1    1       Review of Systems  Constitutional: Positive for appetite change.  HENT: Negative.   Eyes: Negative.   Respiratory: Negative.   Cardiovascular: Positive for chest pain.  Gastrointestinal: Positive for nausea, abdominal pain and diarrhea.  Genitourinary: Negative.   Musculoskeletal: Negative.   Skin: Negative.   Neurological: Negative.   Hematological: Negative.   Psychiatric/Behavioral: Negative.   All other systems reviewed and are negative.    Allergies  Codeine; Morphine and related; and Sulfa antibiotics  Home Medications   Current Outpatient Rx  Name Route Sig Dispense Refill  . CLONAZEPAM 1 MG PO TABS Oral Take 1 mg by mouth 2 (two) times daily as needed.    . DULOXETINE HCL 60 MG PO CPEP Oral Take 60 mg by mouth daily.    [name]  ESTRADIOL 2 MG PO TABS Oral Take 2 mg by mouth 2 (two) times daily.    [name] LAMOTRIGINE 200 MG PO TABS Oral Take 200 mg by mouth 2 (two) times daily.    [name] OMEPRAZOLE 20 MG PO CPDR Oral Take 20 mg by mouth every morning.    [name] PROPRANOLOL HCL 10 MG PO TABS Oral Take 10 mg by mouth as needed.    [name] QUETIAPINE FUMARATE 50 MG PO TABS Oral Take 50 mg by mouth at bedtime.      BP 127/88  Pulse 74  Temp 98.7 F (37.1 C) (Oral)  Resp 18  Ht 5' 4"  (1.626 m)  Wt 225 lb (102.059 kg)  BMI 38.62  kg/m2  SpO2 97%  LMP 06/23/2009  Physical Exam  Nursing note and vitals reviewed. GEN: Well-developed, well-nourished female in no distress HEENT: Atraumatic, normocephalic. Oropharynx clear without erythema EYES: PERRLA BL, no scleral icterus. NECK: Trachea midline, no meningismus CV: regular rate and rhythm. No murmurs, rubs, or gallops PULM: No respiratory distress.  No crackles, wheezes, or rales. GI: soft, tender to palpation in the epigastrium and right upper quadrant. Patient is also tender to palpation the left lower quadrant but reports she is always tender here since her oophorectomy. No guarding, rebound. + bowel sounds  GU: deferred Neuro: cranial nerves grossly 2-12 intact, no abnormalities of strength or sensation, A and O x 3 MSK: Patient moves all 4 extremities symmetrically, no deformity, edema, or injury noted Skin: No rashes petechiae, purpura, or jaundice Psych: no abnormality of mood   ED Course  Procedures (including critical care time)  Indication: RUQ pain Please note this EKG was reviewed extemporaneously by myself.   Date: 11/28/2011  Rate: 67  Rhythm: normal sinus rhythm  QRS Axis: normal  Intervals: normal  ST/T Wave abnormalities: normal  Conduction Disutrbances:none  Narrative Interpretation:   Old EKG Reviewed: unchanged      Labs Reviewed  CBC WITH DIFFERENTIAL - Abnormal; Notable for the following:    RDW 15.6 (*)     Basophils Relative 2 (*)     Basophils Absolute 0.2 (*)     All other components within normal limits  COMPREHENSIVE METABOLIC PANEL - Abnormal; Notable for the following:    Albumin 3.2 (*)     All other components within normal limits  URINALYSIS, ROUTINE W REFLEX MICROSCOPIC - Abnormal; Notable for the following:    APPearance CLOUDY (*)     All other components within normal limits  LIPASE, BLOOD  LACTIC ACID, PLASMA  HELICOBACTER PYLORI ABS-IGG+IGA, BLD   Dg Chest 2 View  11/28/2011  *RADIOLOGY REPORT*   Clinical Data: 42 year old female with chest pain.  CHEST - 2 VIEW  Comparison: 03/03/2009 chest CT  Findings: The cardiomediastinal silhouette is unremarkable. The lungs are clear. There is no evidence of focal airspace disease, pulmonary edema, suspicious pulmonary nodule/mass, pleural effusion, or pneumothorax. No acute bony abnormalities are identified.  IMPRESSION: No evidence of active cardiopulmonary disease.  Original Report Authenticated By: Lura Em, [name]   US Abdomen Complete  11/28/2011  *RADIOLOGY REPORT*  Clinical Data:  Abdominal pain.  COMPLETE ABDOMINAL ULTRASOUND  Comparison:  CT abdomen and pelvis 04/22/2010.  Findings:  Gallbladder:  No gallstones, gallbladder wall thickening, or pericholecystic fluid.  Common bile duct:  Measures 0.2 cm.  Liver:  No focal lesion identified.  Within normal limits in parenchymal echogenicity.  IVC:  Appears normal.  Pancreas:  No focal abnormality seen.  Spleen:  Measures 7.2 cm and appears normal.  Right Kidney:  Measures 9.9 cm and appears normal without stone, mass or hydronephrosis.  Left Kidney:  Measures 11.0 cm and appears normal without stone, mass or hydronephrosis.  Abdominal aorta:  No aneurysm identified.  IMPRESSION: Negative abdominal ultrasound.  Original Report Authenticated By: Arvid Right. D'ALESSIO, [name]     1. Abdominal pain   2. GERD (gastroesophageal reflux disease)       MDM  Patient was evaluated for myself. She had complete laboratory workup for her, pain as well as appropriate workup for chest pain given her age and risk factors. Chest x-ray and EKG were both unremarkable. Patient had normal CBC, CMP, lipase, urinalysis, lactate. Patient also had a completely normal abdominal ultrasound with no evidence of gallbladder disease whatsoever. Workup today essentially ruled out causes of abdominal pain including cholecystitis, urinary tract infection, biliary colic, pancreatitis, and gastroenteritis. Gastritis versus peptic  ulcer disease still seem the likely cause of the patient's symptoms. H. pylori testing was sent today. Patient is already on medications for reflux on daily basis. Patient has not seen a gastroenterologist and was given referral information to the on-call physician. She was also told that since she is followed by [facility] she can contact them for follow-up as well. Patient was discharged with pain and nausea meds in good condition.         Chauncy Passy, MD 11/28/11 1606

## 2011-12-03 ENCOUNTER — Other Ambulatory Visit (HOSPITAL_COMMUNITY): Payer: Self-pay | Admitting: Family Medicine

## 2011-12-03 DIAGNOSIS — R1011 Right upper quadrant pain: Secondary | ICD-10-CM

## 2011-12-06 ENCOUNTER — Encounter (HOSPITAL_COMMUNITY)
Admission: RE | Admit: 2011-12-06 | Discharge: 2011-12-06 | Disposition: A | Discharge status: Home / Self Care | Payer: Self-pay | Source: Ambulatory Visit | Attending: Family Medicine | Admitting: Family Medicine

## 2011-12-06 ENCOUNTER — Encounter (HOSPITAL_COMMUNITY): Payer: Self-pay

## 2011-12-06 DIAGNOSIS — R1011 Right upper quadrant pain: Secondary | ICD-10-CM | POA: Insufficient documentation

## 2011-12-06 MED ORDER — TECHNETIUM TC 99M MEBROFENIN IV KIT
5.5000 | PACK | Freq: Once | INTRAVENOUS | Status: AC | PRN
Start: 2011-12-06 — End: 2011-12-06
  Administered 2011-12-06: 5.5 via INTRAVENOUS

## 2012-02-27 ENCOUNTER — Ambulatory Visit (HOSPITAL_COMMUNITY)
Admission: RE | Admit: 2012-02-27 | Discharge: 2012-02-27 | Disposition: A | Discharge status: Home / Self Care | Payer: Self-pay | Source: Ambulatory Visit | Attending: Family Medicine | Admitting: Family Medicine

## 2012-02-27 ENCOUNTER — Other Ambulatory Visit (HOSPITAL_COMMUNITY): Payer: Self-pay | Admitting: Family Medicine

## 2012-02-27 DIAGNOSIS — R609 Edema, unspecified: Secondary | ICD-10-CM

## 2012-02-27 DIAGNOSIS — R0602 Shortness of breath: Secondary | ICD-10-CM | POA: Insufficient documentation

## 2012-04-01 ENCOUNTER — Encounter (HOSPITAL_COMMUNITY): Payer: Self-pay | Admitting: Emergency Medicine

## 2012-04-01 ENCOUNTER — Emergency Department (HOSPITAL_COMMUNITY)
Admission: EM | Admit: 2012-04-01 | Discharge: 2012-04-01 | Disposition: A | Discharge status: Home / Self Care | Payer: Self-pay | Attending: Emergency Medicine | Admitting: Emergency Medicine

## 2012-04-01 DIAGNOSIS — Z79899 Other long term (current) drug therapy: Secondary | ICD-10-CM | POA: Insufficient documentation

## 2012-04-01 DIAGNOSIS — E785 Hyperlipidemia, unspecified: Secondary | ICD-10-CM | POA: Insufficient documentation

## 2012-04-01 DIAGNOSIS — I10 Essential (primary) hypertension: Secondary | ICD-10-CM | POA: Insufficient documentation

## 2012-04-01 DIAGNOSIS — G8929 Other chronic pain: Secondary | ICD-10-CM | POA: Insufficient documentation

## 2012-04-01 DIAGNOSIS — F3289 Other specified depressive episodes: Secondary | ICD-10-CM | POA: Insufficient documentation

## 2012-04-01 DIAGNOSIS — F329 Major depressive disorder, single episode, unspecified: Secondary | ICD-10-CM | POA: Insufficient documentation

## 2012-04-01 DIAGNOSIS — Z8742 Personal history of other diseases of the female genital tract: Secondary | ICD-10-CM | POA: Insufficient documentation

## 2012-04-01 DIAGNOSIS — Z8659 Personal history of other mental and behavioral disorders: Secondary | ICD-10-CM | POA: Insufficient documentation

## 2012-04-01 DIAGNOSIS — M79609 Pain in unspecified limb: Secondary | ICD-10-CM | POA: Insufficient documentation

## 2012-04-01 LAB — COMPREHENSIVE METABOLIC PANEL
ALT: 31 U/L (ref 0–35)
AST: 27 U/L (ref 0–37)
Albumin: 3.9 g/dL (ref 3.5–5.2)
Alkaline Phosphatase: 83 U/L (ref 39–117)
BUN: 15 mg/dL (ref 6–23)
CO2: 23 mEq/L (ref 19–32)
Calcium: 10.1 mg/dL (ref 8.4–10.5)
Chloride: 101 mEq/L (ref 96–112)
Creatinine, Ser: 0.69 mg/dL (ref 0.50–1.10)
GFR calc Af Amer: >90 mL/min (ref >90)
GFR calc non Af Amer: >90 mL/min (ref >90)
Glucose, Bld: 91 mg/dL (ref 70–99)
Potassium: 3.2 mEq/L — ABNORMAL LOW (ref 3.5–5.1)
Sodium: 138 mEq/L (ref 135–145)
Total Bilirubin: 0.3 mg/dL (ref 0.3–1.2)
Total Protein: 7 g/dL (ref 6.0–8.3)

## 2012-04-01 LAB — CBC
HCT: 38.4 % (ref 36.0–46.0)
Hemoglobin: 12.4 g/dL (ref 12.0–15.0)
MCH: 25.4 pg — ABNORMAL LOW (ref 26.0–34.0)
MCHC: 32.3 g/dL (ref 30.0–36.0)
MCV: 78.5 fL (ref 78.0–100.0)
Platelets: 185 10*3/uL (ref 150–400)
RBC: 4.89 MIL/uL (ref 3.87–5.11)
RDW: 15.3 % (ref 11.5–15.5)
WBC: 9.1 10*3/uL (ref 4.0–10.5)

## 2012-04-01 MED ORDER — HYDROCODONE-ACETAMINOPHEN 5-325 MG PO TABS
1.0000 | ORAL_TABLET | Freq: Once | ORAL | Status: AC
  Administered 2012-04-01: 1 via ORAL
  Filled 2012-04-01: qty 1

## 2012-04-01 MED ORDER — OXYCODONE-ACETAMINOPHEN 5-325 MG PO TABS: 1.0000 | ORAL_TABLET | Freq: Four times a day (QID) | ORAL | Status: DC | PRN

## 2012-04-01 MED ORDER — PREDNISONE 50 MG PO TABS: 50.0000 mg | ORAL_TABLET | Freq: Every day | ORAL | Status: DC

## 2012-04-01 NOTE — ED Notes (Signed)
Has been checked for rheumatod arthritis in nov it was neg she has seen a dr for all this and was sent to other dr

## 2012-04-01 NOTE — ED Provider Notes (Signed)
History     CSN: [id number]  Arrival date & time 04/01/12  1457   First MD Initiated Contact with Patient 04/01/12 2014      No chief complaint on file.   (Consider location/radiation/quality/duration/timing/severity/associated sxs/prior treatment) HPI The patient presents to the ED with chronic pain.  She reports bilateral LE and bilateral UE pain.  She denies trauma to the area.  Denies joint pain and states it is "from my toes to my hips".  She reports an appointment today with Dr. Lenna Gilford, a rheumatologist,  But her appointment was canceled due to her lack of insurance and the assumptions she did not want to pay for encounter and testing.  Her mother stated she wanted answers now and she brought her to the ED.[name]  Patient denies fevers, chest pain, shortness breath, nausea, vomiting, diarrhea, headache, weakness, or visual changes.  Patient does state that she does get some tingling in her hands and feet.  Past Medical History  Diagnosis Date  . PTSD (post-traumatic stress disorder)   . Hypertension   . Depression   . Hyperlipemia   . Endometriosis     Past Surgical History  Procedure Date  . Partial hysterectomy   . Tonsillectomy   . Cesarean section   . Temporomandibular joint surgery     right side  . Oophorectomy     bilateral     Family History  Problem Relation Age of Onset  . Diabetes Maternal Grandfather   . Heart disease Maternal Grandfather   . Hypertension Maternal Grandfather     History  Substance Use Topics  . Smoking status: Never Smoker   . Smokeless tobacco: Never Used  . Alcohol Use: No    OB History    Grav Para Term Preterm Abortions TAB SAB Ect Mult Living   2 1   1 1    1       Review of Systems All other systems negative except as documented in the HPI. All pertinent positives and negatives as reviewed in the HPI.   Allergies  Codeine; Morphine and related; and Sulfa antibiotics  Home Medications   Current Outpatient Rx  Name   Route  Sig  Dispense  Refill  . CLONAZEPAM 1 MG PO TABS   Oral   Take 1 mg by mouth 2 (two) times daily as needed.         . DULOXETINE HCL 60 MG PO CPEP   Oral   Take 60 mg by mouth daily.         [name] ESTRADIOL 2 MG PO TABS   Oral   Take 2 mg by mouth 2 (two) times daily.         [name] LAMOTRIGINE 200 MG PO TABS   Oral   Take 200 mg by mouth 2 (two) times daily.         [name] OMEPRAZOLE 20 MG PO CPDR   Oral   Take 20 mg by mouth every morning.         . OXYCODONE-ACETAMINOPHEN 5-325 MG PO TABS      Take 1-2 tabs by mouth every 6 hours when necessary pain.   30 tablet   0   . PROPRANOLOL HCL 10 MG PO TABS   Oral   Take 10 mg by mouth as needed.         [name] QUETIAPINE FUMARATE 50 MG PO TABS   Oral   Take 50 mg by mouth at bedtime.  BP 129/94  Pulse 94  Temp 98.1 F (36.7 C) (Oral)  SpO2 99%  LMP 06/23/2009  Physical Exam  Constitutional: She is oriented to person, place, and time. She appears well-developed and well-nourished. She is cooperative. She does not appear ill.  HENT:  Head: Normocephalic and atraumatic.  Mouth/Throat: Oropharynx is clear and moist.  Eyes: Pupils are equal, round, and reactive to light.  Neck: Normal range of motion. Neck supple.  Cardiovascular: Normal rate and regular rhythm.  Exam reveals distant heart sounds.   Pulmonary/Chest: Effort normal and breath sounds normal. She has no wheezes. She has no rhonchi. She has no rales.  Abdominal: Soft. Normal appearance and bowel sounds are normal. There is tenderness in the epigastric area.  Musculoskeletal:       No appreciable swelling.  No bony tenderness. Normal UE strength and LE strength.   Neurological: She is alert and oriented to person, place, and time. She has normal strength and normal reflexes. She displays normal reflexes. She exhibits normal muscle tone. Coordination normal.  Skin: Skin is warm and dry. No rash noted.     ED Course  Procedures (including  critical care time)   Patient, is advised of her testing results, and she's also advised to followup with her doctor for further recheck and evaluation.  I advised her to call her doctor about rescheduling the rheumatological appointment.  Patient, states that she agrees with the plan and voice understanding and apologized for the delay in her care due to laboratory error.   [place], PA-C 04/01/12 2257

## 2012-04-01 NOTE — ED Notes (Signed)
Bilateral leg pain and tingling x 2 months also has slurred speech for  2 months also  Had a dr appointment today but they changed it w/out telling her so she came here. Pt able to walk well into triage w/o assist

## 2012-04-02 NOTE — ED Provider Notes (Signed)
Medical screening examination/treatment/procedure(s) were performed by non-physician practitioner and as supervising physician I was immediately available for consultation/collaboration.   Charles B. Karle Starch, MD 04/02/12 1505

## 2012-10-25 ENCOUNTER — Emergency Department (HOSPITAL_COMMUNITY)
Admission: EM | Admit: 2012-10-25 | Discharge: 2012-10-25 | Disposition: A | Discharge status: Home / Self Care | Payer: No Typology Code available for payment source | Attending: Emergency Medicine | Admitting: Emergency Medicine

## 2012-10-25 ENCOUNTER — Emergency Department (HOSPITAL_COMMUNITY): Payer: No Typology Code available for payment source

## 2012-10-25 DIAGNOSIS — Y9269 Other specified industrial and construction area as the place of occurrence of the external cause: Secondary | ICD-10-CM | POA: Insufficient documentation

## 2012-10-25 DIAGNOSIS — Y939 Activity, unspecified: Secondary | ICD-10-CM | POA: Insufficient documentation

## 2012-10-25 DIAGNOSIS — M546 Pain in thoracic spine: Secondary | ICD-10-CM

## 2012-10-25 DIAGNOSIS — Z8742 Personal history of other diseases of the female genital tract: Secondary | ICD-10-CM | POA: Insufficient documentation

## 2012-10-25 DIAGNOSIS — Z8659 Personal history of other mental and behavioral disorders: Secondary | ICD-10-CM | POA: Insufficient documentation

## 2012-10-25 DIAGNOSIS — IMO0002 Reserved for concepts with insufficient information to code with codable children: Secondary | ICD-10-CM | POA: Insufficient documentation

## 2012-10-25 DIAGNOSIS — Z862 Personal history of diseases of the blood and blood-forming organs and certain disorders involving the immune mechanism: Secondary | ICD-10-CM | POA: Insufficient documentation

## 2012-10-25 DIAGNOSIS — Z79899 Other long term (current) drug therapy: Secondary | ICD-10-CM | POA: Insufficient documentation

## 2012-10-25 DIAGNOSIS — F3289 Other specified depressive episodes: Secondary | ICD-10-CM | POA: Insufficient documentation

## 2012-10-25 DIAGNOSIS — S0990XA Unspecified injury of head, initial encounter: Secondary | ICD-10-CM | POA: Insufficient documentation

## 2012-10-25 DIAGNOSIS — Z8639 Personal history of other endocrine, nutritional and metabolic disease: Secondary | ICD-10-CM | POA: Insufficient documentation

## 2012-10-25 DIAGNOSIS — M545 Low back pain, unspecified: Secondary | ICD-10-CM

## 2012-10-25 DIAGNOSIS — Z880 Allergy status to penicillin: Secondary | ICD-10-CM | POA: Insufficient documentation

## 2012-10-25 DIAGNOSIS — I10 Essential (primary) hypertension: Secondary | ICD-10-CM | POA: Insufficient documentation

## 2012-10-25 DIAGNOSIS — S298XXA Other specified injuries of thorax, initial encounter: Secondary | ICD-10-CM | POA: Insufficient documentation

## 2012-10-25 DIAGNOSIS — F329 Major depressive disorder, single episode, unspecified: Secondary | ICD-10-CM | POA: Insufficient documentation

## 2012-10-25 MED ORDER — HYDROMORPHONE HCL PF 1 MG/ML IJ SOLN
1.0000 mg | Freq: Once | INTRAMUSCULAR | Status: AC
  Administered 2012-10-25: 1 mg via INTRAVENOUS
  Filled 2012-10-25: qty 1

## 2012-10-25 MED ORDER — METAXALONE 800 MG PO TABS: 800.0000 mg | ORAL_TABLET | Freq: Three times a day (TID) | ORAL | Status: DC

## 2012-10-25 MED ORDER — SODIUM CHLORIDE 0.9 % IV SOLN: Freq: Once | INTRAVENOUS | Status: DC

## 2012-10-25 MED ORDER — ONDANSETRON HCL 4 MG/2ML IJ SOLN: 4.0000 mg | Freq: Once | INTRAMUSCULAR | Status: DC

## 2012-10-25 MED ORDER — KETOROLAC TROMETHAMINE 30 MG/ML IJ SOLN
30.0000 mg | Freq: Once | INTRAMUSCULAR | Status: AC
  Administered 2012-10-25: 30 mg via INTRAVENOUS
  Filled 2012-10-25: qty 1

## 2012-10-25 MED ORDER — OXYCODONE-ACETAMINOPHEN 5-325 MG PO TABS: 2.0000 | ORAL_TABLET | Freq: Four times a day (QID) | ORAL | Status: DC | PRN

## 2012-10-25 MED ORDER — ONDANSETRON 8 MG PO TBDP
8.0000 mg | ORAL_TABLET | Freq: Once | ORAL | Status: AC
  Administered 2012-10-25: 8 mg via ORAL
  Filled 2012-10-25: qty 1

## 2012-10-25 MED ORDER — DIAZEPAM 5 MG/ML IJ SOLN
5.0000 mg | Freq: Once | INTRAMUSCULAR | Status: AC
  Administered 2012-10-25: 5 mg via INTRAVENOUS
  Filled 2012-10-25: qty 2

## 2012-10-25 MED ORDER — OXYCODONE-ACETAMINOPHEN 5-325 MG PO TABS
2.0000 | ORAL_TABLET | Freq: Once | ORAL | Status: AC
  Administered 2012-10-25: 2 via ORAL
  Filled 2012-10-25: qty 2

## 2012-10-25 NOTE — ED Notes (Signed)
Pt is able to ambulate without difficulty. Pt states she is ready to go home
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
## 2012-10-25 NOTE — ED Notes (Signed)
Pt cleared from back board.

## 2012-10-25 NOTE — ED Notes (Signed)
Pt on phone
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
## 2012-10-25 NOTE — ED Notes (Signed)
Pt. Ambulated down hall and back to her room without difficulty. Pt. Gait steady on her feet while ambulating.Pt. States she is dizzy and her back hurts.

## 2012-10-28 NOTE — ED Provider Notes (Signed)
Medical screening examination/treatment/procedure(s) were performed by non-physician practitioner and as supervising physician I was immediately available for consultation/collaboration.   Wandra Arthurs, MD 10/28/12 [phone]

## 2014-02-14 ENCOUNTER — Encounter (HOSPITAL_COMMUNITY): Payer: Self-pay | Admitting: Emergency Medicine

## 2014-02-22 ENCOUNTER — Encounter: Payer: Self-pay | Admitting: Gastroenterology

## 2014-03-02 ENCOUNTER — Ambulatory Visit: Payer: Self-pay | Admitting: Gastroenterology

## 2014-03-08 ENCOUNTER — Ambulatory Visit (INDEPENDENT_AMBULATORY_CARE_PROVIDER_SITE_OTHER): Payer: Medicaid Other | Admitting: Gastroenterology

## 2014-03-08 ENCOUNTER — Encounter: Payer: Self-pay | Admitting: Gastroenterology

## 2014-03-08 VITALS — BP 120/80 | HR 104 | Ht 63.5 in | Wt 227.0 lb

## 2014-03-08 DIAGNOSIS — R142 Eructation: Secondary | ICD-10-CM

## 2014-03-08 DIAGNOSIS — R1013 Epigastric pain: Secondary | ICD-10-CM

## 2014-03-08 DIAGNOSIS — R110 Nausea: Secondary | ICD-10-CM

## 2014-03-08 NOTE — Progress Notes (Signed)
Agree with initial assessment and plans

## 2014-03-08 NOTE — Patient Instructions (Signed)
You have been scheduled for an endoscopy. Please follow written instructions given to you at your visit today. If you use inhalers (even only as needed), please bring them with you on the day of your procedure.

## 2014-03-08 NOTE — Progress Notes (Signed)
03/08/2014 Melinda Porter [id number] 10/14/1970   HISTORY OF PRESENT ILLNESS:  This is a 43 year old female who is new to our practice and was referred by her PCP for evaluation of epigastric abdominal pain and belching.  Patient says that these symptoms have been present for years.  Has pain in her epigastrium all the time, but says that when she bends over she feels like something "pops out" or "flips" in her upper abdomen and then she has to "push it back in".  She says that it hurts when this happens and scares her.  She is concerned about a hiatal hernia.  Also has nausea, belching, and upper abdominal bloating.  Has been on prilosec for years, but says that it wasn't helping any of her symptoms.  Was diagnosed with Hpylori by her PCP and is currently being treated with Prevpak, which she will complete tomorrow.  Says that she's had some diarrhea for the past couple of days, which she contributes to a side effect of the antibiotics.  She was given a prescription for Nexium to start taking once she finishes her Prevpak as well.  She admits to taking 2 Ibuprofen daily for several years.  Recent CBC and CMP unremarkable.     Past Medical History  Diagnosis Date  . PTSD (post-traumatic stress disorder)   . Hypertension   . Depression   . Hyperlipemia   . Endometriosis   . Anxiety   . DM (diabetes mellitus)    Past Surgical History  Procedure Laterality Date  . Partial hysterectomy  07/2009  . Tonsillectomy  2009  . Cesarean section  1997  . Temporomandibular joint surgery  2009    right side  . Oophorectomy Bilateral     reports that she quit smoking about 15 years ago. Her smoking use included Cigarettes. She smoked 0.00 packs per day. She has never used smokeless tobacco. She reports that she does not drink alcohol or use illicit drugs. family history includes Colon cancer in her maternal uncle; Colon polyps in her maternal grandmother; Diabetes in her maternal grandfather; Heart  disease in her maternal grandfather; Hypertension in her maternal grandfather; Liver disease in her father. Allergies  Allergen Reactions  . Penicillins   . Codeine Rash  . Morphine And Related Rash  . Sulfa Antibiotics Rash      Outpatient Encounter Prescriptions as of 03/08/2014  Medication Sig  . amoxicillin-clarithromycin-lansoprazole (PREVPAC) combo pack Take by mouth 2 (two) times daily. Follow package directions.  . clonazePAM (KLONOPIN) 1 MG tablet Take 1 mg by mouth 4 (four) times daily as needed. For anxiety  . escitalopram (LEXAPRO) 20 MG tablet Take 20 mg by mouth at bedtime.  . gabapentin (NEURONTIN) 100 MG capsule Take 100 mg by mouth 3 (three) times daily.  . hydrocortisone cream 1 % Apply 1 application topically 2 (two) times daily as needed. For itching  . ibuprofen (ADVIL,MOTRIN) 800 MG tablet Take 800 mg by mouth 2 (two) times daily.  [name] triamterene-hydrochlorothiazide (MAXZIDE-25) 37.5-25 MG per tablet Take 1 tablet by mouth daily.  . [DISCONTINUED] metaxalone (SKELAXIN) 800 MG tablet Take 1 tablet (800 mg total) by mouth 3 (three) times daily.  . [DISCONTINUED] naproxen sodium (ANAPROX) 220 MG tablet Take 220 mg by mouth 2 (two) times daily as needed. For pain  . [DISCONTINUED] oxyCODONE-acetaminophen (PERCOCET/ROXICET) 5-325 MG per tablet Take 2 tablets by mouth every 6 (six) hours as needed for pain.  . [DISCONTINUED] PROPRANOLOL HCL PO  Take by mouth.     REVIEW OF SYSTEMS  : All other systems reviewed and negative except where noted in the History of Present Illness.   PHYSICAL EXAM: BP 120/80 mmHg  Pulse 104  Ht 5' 3.5" (1.613 m)  Wt 227 lb (102.967 kg)  BMI 39.58 kg/m2  LMP 06/23/2009 General: Well developed white female in no acute distress Head: Normocephalic and atraumatic Eyes:  Sclerae anicteric, conjunctiva pink. Ears: Normal auditory acuity Lungs: Clear throughout to auscultation Heart: Regular rate and rhythm Abdomen: Soft, non-distended.   Normal bowel sounds.  Epigastric TTP without R/R/G. Musculoskeletal: Symmetrical with no gross deformities  Skin: No lesions on visible extremities Extremities: No edema  Neurological: Alert oriented x 4, grossly non-focal Psychological:  Alert and cooperative. Normal mood and affect  ASSESSMENT AND PLAN: -43 year old female with complaints of epigastric abdominal pain, belching, upper abdominal bloating, nausea, and recent diagnosis of Hpylori.  She will complete her Hpylori treatment of Prevpak, which she will finish tomorrow, 11/25.  Then she will start the Nexium that she was prescribed by her PCP.  We will schedule for EGD for further evaluation to rule out ulcer disease, etc.  She is at risk for ulcer disease with daily NSAID use and the Hpylori infection.  The risks, benefits, and alternatives were discussed with the patient and she consents to proceed.

## 2014-03-22 ENCOUNTER — Encounter: Payer: Self-pay | Admitting: Gastroenterology

## 2014-04-13 ENCOUNTER — Ambulatory Visit (INDEPENDENT_AMBULATORY_CARE_PROVIDER_SITE_OTHER): Payer: Medicaid Other | Admitting: Internal Medicine

## 2014-04-13 VITALS — BP 124/68 | HR 99 | Temp 98.2°F | Resp 12 | Ht 64.0 in | Wt 232.0 lb

## 2014-04-13 DIAGNOSIS — E162 Hypoglycemia, unspecified: Secondary | ICD-10-CM

## 2014-04-13 NOTE — Progress Notes (Signed)
Patient ID: Melinda Porter, female   DOB: Feb 13, 1971, 43 y.o.   MRN: [id number]  HPI: Melinda Porter is a 43 y.o.-year-old female, referred by Katheran Awe, NP, in consultation for hypoglycemia.  She has had hypoglycemic sxs for the last 15 years. They were happening during the night >> she tells me she now knows how to avoid them >> eats PB before bedtime. Avoids sodas. The episodes were happening every day, now she tells me she knows how to avoid them >> more seldom. - + vision - blurry - + tremors - + palpitations - + sensation of passing out - has HAs after the episodes - + anxiety  Lowest CBG reading: 20 (26 per PCP note)! 1 mo ago (in the evening). She could climb stairs to her kitchen to get something sweet after this reading. Other than this episode: CBG 40-60 lowest. In am: 60's lowest.  Has glucose tablets with her all the time >> usually took 1 tab >> she was told to take 4 at the time.  She now eats little meals, frequently.  No fasting hypoglycemia but starts to feel hypoglycemic <2h hours after she eats. Usually b/w B and L or right after lunch.  She was on Prednisone for a long time for Achilles tendonitis and bone spurs >> 2 steroid injections >> then Prednisone - all in 01/23/2014 >> gained 15 lbs since then.  Last HbA1c: 02/18/2014: HbA1c 5.9%  - all glucoses reviewed are normal: 01/14/2014: Glu 94 07/03/2013: Glu 97 Lab Results  Component Value Date   GLUCOSE 91 04/01/2012   GLUCOSE 92 11/28/2011   GLUCOSE 137* 07/01/2010   GLUCOSE 77 06/22/2010   GLUCOSE 81 04/22/2010   GLUCOSE 85 07/24/2009   GLUCOSE 92 08/16/2008   No meds with hypoglycemia side effects on her med list.  Pt's meals are: - Breakfast: 1 cup of coffee with creamer, no sugar; grits or boiled eggs. No juices. - Lunch: sandwich or leftovers - Dinner: lean meat with vegetables - Snacks: 2  - apples or PB  She has not seen nutrition in the past.  She used to exercise every day >> had a  smoothie right after this >> no lows.  - no CKD, last BUN/creatinine:  Lab Results  Component Value Date   BUN 15 04/01/2012   CREATININE 0.69 04/01/2012   Other labs: 01/14/2014: TSH 2.098; Lipids: 213/383/35/101; BUN/Cr 13/0.82; AST/ALT 19/18; Glu 94 03/15/2014,  4 pm: cortisol 3.9  I reviewed her chart and she also has a history of hysterectomy and BSO. She has HAs every day >> recent CT head negative. R eye vision poor >> saw ophthalmologist. 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 Testosterone was high in 06/2013.  + DM in GF.  She also has a h/o HTN, HL. She also  has fibromyalgia.   ROS: Constitutional: + weight gain, + fatigue, + subjective hyperthermia Eyes: + blurry vision, no xerophthalmia ENT: no sore throat, no nodules palpated in throat, no dysphagia/odynophagia, no hoarseness Cardiovascular: no CP/SOB/palpitations/+ hands and feet swelling Respiratory: no cough/SOB Gastrointestinal: no N/V/D/C/+ acid reflux Musculoskeletal: + both: muscle/joint aches Skin: + rash, easy bruising, stretch markes Neurological: no tremors/numbness/tingling/dizziness, + HA Psychiatric: + depression/+ anxiety + low libido  Past Medical History  Diagnosis Date  . PTSD (post-traumatic stress disorder)   . Hypertension   . Depression   . Hyperlipemia   . Endometriosis   .  Anxiety   . DM (diabetes mellitus)    Past Surgical History  Procedure Laterality Date  . Partial hysterectomy  07/2009  . Tonsillectomy  2009  . Cesarean section  1997  . Temporomandibular joint surgery  2009    right side  . Oophorectomy Bilateral    History   Social History  . Marital Status: Legally Separated    Spouse Name: N/A    Number of Children: 1   Occupational History  . work from home [employer] Topics  . Smoking status: Former Smoker    Types: Cigarettes    Quit date: 1999  . Smokeless  tobacco: Never Used  . Alcohol Use: No  . Drug Use: No  . Sexual Activity:    Partners: Male    Birth Control/ Protection: Surgical   Other Topics Concern  . Not on file   Social History Narrative   Current Outpatient Prescriptions on File Prior to Visit  Medication Sig Dispense Refill  . clonazePAM (KLONOPIN) 1 MG tablet Take 1 mg by mouth 4 (four) times daily as needed. For anxiety    . escitalopram (LEXAPRO) 20 MG tablet Take 20 mg by mouth at bedtime.    . gabapentin (NEURONTIN) 100 MG capsule Take 100 mg by mouth 3 (three) times daily.    . hydrocortisone cream 1 % Apply 1 application topically 2 (two) times daily as needed. For itching    . ibuprofen (ADVIL,MOTRIN) 800 MG tablet Take 800 mg by mouth 3 (three) times daily.     [name] triamterene-hydrochlorothiazide (MAXZIDE-25) 37.5-25 MG per tablet Take 1 tablet by mouth daily.    [name] amoxicillin-clarithromycin-lansoprazole (PREVPAC) combo pack Take by mouth 2 (two) times daily. Follow package directions.     No current facility-administered medications on file prior to visit.   Allergies  Allergen Reactions  . Penicillins   . Codeine Rash  . Morphine And Related Rash  . Sulfa Antibiotics Rash   Family History  Problem Relation Age of Onset  . Diabetes Maternal Grandfather   . Heart disease Maternal Grandfather   . Hypertension Maternal Grandfather   . Colon cancer Maternal Uncle     great uncle  . Colon polyps Maternal Grandmother   . Liver disease Father     agent orange   PE: BP 124/68 mmHg  Pulse 99  Temp(Src) 98.2 F (36.8 C) (Oral)  Resp 12  Ht 5' 4"  (1.626 m)  Wt 232 lb (105.235 kg)  BMI 39.80 kg/m2  SpO2 96%  LMP 06/23/2009 Wt Readings from Last 3 Encounters:  04/13/14 232 lb (105.235 kg)  03/08/14 227 lb (102.967 kg)  10/25/12 213 lb (96.616 kg)   Constitutional: obese, in NAD Eyes: PERRLA, EOMI, no exophthalmos ENT: moist mucous membranes, no thyromegaly, no cervical  lymphadenopathy Cardiovascular: RRR, No MRG Respiratory: CTA B Gastrointestinal: abdomen soft, NT, ND, BS+ Musculoskeletal: no deformities, strength intact in all 4 Skin: moist, warm, no rashes Neurological: no tremor with outstretched hands, DTR normal in all 4  ASSESSMENT: 1. Postprandial Hypoglycemia  PLAN:  1. Patient with history of what appears as postprandial) hypoglycemia, with a very low CBG (20 or 26! Per her meter read >> however, she was able to go up stairs and get something sweet to eat, which makes me think her actual sugar may have been higher) and CBG levels out of proportion compared with her symptoms. She describes hypoglycemic sxs even in the 60s. She has a sensation that  she would pass out, and also has anxiety, tremors, headaches associated with the episodes. She changed her diet and eats more frequently and smaller meals, omits sodas or tea; has glucose tablets with her; has fast carbs on her nightstand; so intuitively she is doing exactly what is recommended in these situations. - I had a long discussion with the patient about what reactive hypoglycemia means, and the fact that in patients without diabetes, the CBG threshold of 70 does not apply. Therefore, a sugar in the 60s is perfectly normal, and it can be even lower (40s) than that especially in young women and athletes.  - We discussed about what the notion of "sugar crash" means and that it can be a normal reaction to a high glycemic index food.  - In pts with insulin resistance, for example prediabetic pts (and her HbA1c was 5.9%, in the prediabetic range), there can be a mismatch between the sugar increase after a meal and pancreatic insulin production. Insulin is secreted in the circulation to late and in too large quantities >> hypoglycemia.  - we discussed at length about diet (including examples) and the importance of avoiding high Glycemic Index foods - given web link for reference.  - I advised her to continue  to work with nutrition to improve her diet to avoid reactive hypoglycemia; I will refer her to nutrition for further diet help - I also advised her to go to the nearest lab (have somebody drive her) if sugars are 50 or below with the following lab orders to be done if her sugars are low: Orders Placed This Encounter  . Glucose, Random  . Proinsulin/insulin ratio  . Insulin, random  . C-peptide  . Beta-hydroxybutyric acid  . Sulfonylurea Hypoglycemics Panel, blood  - since she was on steroids and recently stopped >> will check for adrenal insufficiency: cosyntropin stim test >> she will come back for this  - will see her back in 4 mo >> if she does not feel better after more changes in her diet >> may need Acarbose before b'fast and lunch, since these are the problematic meals.  - she will continue to check her sugars 3x a day until then  Patient Instructions   Please come back at 8 am, fasting, for a cortisol stimulation test >> plan to be here for 1 hour.  Please review the following website - stay with the lower glycemic load foods: Www.glycemicindex.com  Have 3 meals a day + 2-3 snacks.  Eat as much fruit and veggies as you can. Berries, pears, apples, peaches, apricots - are the best.  Do not drink liquids with a meal, separate them by at least 30 min.   Start the meal with protein and fat and end with carbs.  Continue not to drink Gatorade/regular sodas/regular tea as they have a lot of sugar.  Carry a snack with you everywhere. Best - to contain ~15 g of carbs.  I will refer you to nutrition.   Please schedule a return appt in 4 mo.  - time spent with the patient: 1 hour, of which >50% was spent in obtaining information about her symptoms, reviewing her previous labs, evaluations, and treatments, counseling her about her condition (please see the discussed topics above), and developing a plan to further investigate it. She had a number of questions which I  addressed.  Component     Latest Ref Rng 04/22/2014 04/22/2014         7:58 AM 10:04 AM  Cortisol, Plasma      9.8 26.5  No sign of adrenal insufficiency.

## 2014-04-13 NOTE — Patient Instructions (Addendum)
  Please come back at 8 am, fasting, for a cortisol stimulation test >> plan to be here for 1 hour.  Please review the following website - stay with the lower glycemic load foods: Www.glycemicindex.com  Have 3 meals a day + 2-3 snacks.  Eat as much fruit and veggies as you can. Berries, pears, apples, peaches, apricots - are the best.  Do not drink liquids with a meal, separate them by at least 30 min.   Start the meal with protein and fat and end with carbs.  Continue not to drink Gatorade/regular sodas/regular tea as they have a lot of sugar.  Carry a snack with you everywhere. Best - to contain ~15 g of carbs.  I will refer you to nutrition.   Please schedule a return appt in 4 mo

## 2014-04-15 DIAGNOSIS — I639 Cerebral infarction, unspecified: Secondary | ICD-10-CM

## 2014-04-15 HISTORY — DX: Cerebral infarction, unspecified: I63.9

## 2014-04-22 ENCOUNTER — Other Ambulatory Visit (INDEPENDENT_AMBULATORY_CARE_PROVIDER_SITE_OTHER): Payer: Medicaid Other

## 2014-04-22 ENCOUNTER — Other Ambulatory Visit (INDEPENDENT_AMBULATORY_CARE_PROVIDER_SITE_OTHER): Payer: Medicaid Other | Admitting: *Deleted

## 2014-04-22 DIAGNOSIS — E162 Hypoglycemia, unspecified: Secondary | ICD-10-CM

## 2014-04-22 LAB — CORTISOL
CORTISOL PLASMA: 9.8 ug/dL
Cortisol, Plasma: 26.5 ug/dL

## 2014-04-22 MED ORDER — COSYNTROPIN 0.25 MG IJ SOLR
0.2500 mg | Freq: Once | INTRAMUSCULAR | Status: AC
  Administered 2014-04-22: 0.25 mg via INTRAMUSCULAR

## 2014-04-25 ENCOUNTER — Encounter: Payer: Self-pay | Admitting: Internal Medicine

## 2014-05-10 ENCOUNTER — Other Ambulatory Visit: Payer: Self-pay

## 2014-05-10 ENCOUNTER — Telehealth: Payer: Self-pay | Admitting: Internal Medicine

## 2014-05-10 ENCOUNTER — Ambulatory Visit (AMBULATORY_SURGERY_CENTER): Payer: Medicaid Other | Admitting: Internal Medicine

## 2014-05-10 ENCOUNTER — Encounter: Payer: Self-pay | Admitting: Internal Medicine

## 2014-05-10 VITALS — BP 119/72 | HR 76 | Temp 97.6°F | Resp 14 | Ht 64.0 in | Wt 232.0 lb

## 2014-05-10 DIAGNOSIS — R1084 Generalized abdominal pain: Secondary | ICD-10-CM

## 2014-05-10 DIAGNOSIS — R1013 Epigastric pain: Secondary | ICD-10-CM

## 2014-05-10 MED ORDER — SODIUM CHLORIDE 0.9 % IV SOLN: 500.0000 mL | INTRAVENOUS | Status: DC

## 2014-05-10 NOTE — Progress Notes (Signed)
Called to room to assist during endoscopic procedure.  Patient ID and intended procedure confirmed with present staff. Received instructions for my participation in the procedure from the performing physician.  

## 2014-05-10 NOTE — Op Note (Signed)
[facility]  [address] [place], [address]   ENDOSCOPY PROCEDURE REPORT  PATIENT: Melinda Porter, Melinda Porter  MR#: [id number] BIRTHDATE: 1970/08/22 , 43  yrs. old GENDER: female ENDOSCOPIST: Eustace Quail, MD REFERRED BY:  Heide Scales , NP PROCEDURE DATE:  05/10/2014 PROCEDURE:  EGD w/ biopsy ASA CLASS:     Class II INDICATIONS:  abdominal pain. MEDICATIONS: Monitored anesthesia care and Propofol 100 mg IV TOPICAL ANESTHETIC: none  DESCRIPTION OF PROCEDURE: After the risks benefits and alternatives of the procedure were thoroughly explained, informed consent was obtained.  The LB [id number] endoscope was introduced through the mouth and advanced to the second portion of the duodenum , Without limitations.  The instrument was slowly withdrawn as the mucosa was fully examined.      EXAM: The esophagus and gastroesophageal junction were completely normal in appearance.  The stomach was entered and closely examined.The antrum, angularis, and lesser curvature were well visualized, including a retroflexed view of the cardia and fundus. The stomach wall was normally distensable.  The scope passed easily through the pylorus into the duodenum.  Retroflexed views revealed no abnormalities.     The scope was then withdrawn from the patient and the procedure completed.  COMPLICATIONS: There were no immediate complications.  ENDOSCOPIC IMPRESSION: 1. Normal EGD 2. Previous treatment for H. pylori. Status post CLO biopsy  RECOMMENDATIONS: 1. My office up an Abdominal Ultrasound   "abdominal discomfort upper abdomen, bloating. Evaluate"  REPEAT EXAM:  eSigned:  Eustace Quail, MD 05/10/2014 9:14 AM    CC:The Patient  ; Heide Scales, NP

## 2014-05-10 NOTE — Patient Instructions (Signed)
YOU HAD AN ENDOSCOPIC PROCEDURE TODAY AT [facility]: Refer to the procedure report that was given to you for any specific questions about what was found during the examination.  If the procedure report does not answer your questions, please call your gastroenterologist to clarify.  If you requested that your care partner not be given the details of your procedure findings, then the procedure report has been included in a sealed envelope for you to review at your convenience later.  YOU SHOULD EXPECT: Some feelings of bloating in the abdomen. Passage of more gas than usual.  Walking can help get rid of the air that was put into your GI tract during the procedure and reduce the bloating. If you had a lower endoscopy (such as a colonoscopy or flexible sigmoidoscopy) you may notice spotting of blood in your stool or on the toilet paper. If you underwent a bowel prep for your procedure, then you may not have a normal bowel movement for a few days.  DIET: Your first meal following the procedure should be a light meal and then it is ok to progress to your normal diet.  A half-sandwich or bowl of soup is an example of a good first meal.  Heavy or fried foods are harder to digest and may make you feel nauseous or bloated.  Likewise meals heavy in dairy and vegetables can cause extra gas to form and this can also increase the bloating.  Drink plenty of fluids but you should avoid alcoholic beverages for 24 hours.  ACTIVITY: Your care partner should take you home directly after the procedure.  You should plan to take it easy, moving slowly for the rest of the day.  You can resume normal activity the day after the procedure however you should NOT DRIVE or use heavy machinery for 24 hours (because of the sedation medicines used during the test).    SYMPTOMS TO REPORT IMMEDIATELY: A gastroenterologist can be reached at any hour.  During normal business hours, 8:30 AM to 5:00 PM Monday through Friday,  call [phone].  After hours and on weekends, please call the GI answering service at [phone] who will take a message and have the physician on call contact you.     Following upper endoscopy (EGD)  Vomiting of blood or coffee ground material  New chest pain or pain under the shoulder blades  Painful or persistently difficult swallowing  New shortness of breath  Fever of 100F or higher  Black, tarry-looking stools  FOLLOW UP: If any biopsies were taken you will be contacted by phone or by letter within the next 1-3 weeks.  Call your gastroenterologist if you have not heard about the biopsies in 3 weeks.  Our staff will call the home number listed on your records the next business day following your procedure to check on you and address any questions or concerns that you may have at that time regarding the information given to you following your procedure. This is a courtesy call and so if there is no answer at the home number and we have not heard from you through the emergency physician on call, we will assume that you have returned to your regular daily activities without incident.  Await biopsy results -H-pylori  Dr. Blanch Media office to schedule abdominal ultrasound- they will contact you  SIGNATURES/CONFIDENTIALITY: You and/or your care partner have signed paperwork which will be entered into your electronic medical record.  These signatures attest to the fact  that that the information above on your After Visit Summary has been reviewed and is understood.  Full responsibility of the confidentiality of this discharge information lies with you and/or your care-partner.

## 2014-05-10 NOTE — Progress Notes (Signed)
Patient awakening, vss report to rn

## 2014-05-11 ENCOUNTER — Telehealth: Payer: Self-pay

## 2014-05-11 ENCOUNTER — Telehealth: Payer: Self-pay | Admitting: *Deleted

## 2014-05-11 LAB — HELICOBACTER PYLORI SCREEN-BIOPSY: UREASE: NEGATIVE

## 2014-05-11 NOTE — Telephone Encounter (Signed)
  Follow up Call-  Call back number 05/10/2014  Post procedure Call Back phone  # [phone]  Permission to leave phone message Yes     Patient questions:  Do you have a fever, pain , or abdominal swelling? No. Pain Score  0 *  Have you tolerated food without any problems? Yes.    Have you been able to return to your normal activities? Yes.    Do you have any questions about your discharge instructions: Diet   No. Medications  No. Follow up visit  No.  Do you have questions or concerns about your Care? No.  Actions: * If pain score is 4 or above: No action needed, pain <4.

## 2014-05-11 NOTE — Telephone Encounter (Signed)
Pt scheduled for [place] of abdomen at [facility] 05/13/14@10am , pt to arrive there at 9:45am. Pt to be NPO after midnight. Pt aware of appt.

## 2014-05-13 ENCOUNTER — Ambulatory Visit (HOSPITAL_COMMUNITY): Payer: Medicaid Other

## 2014-05-13 ENCOUNTER — Ambulatory Visit: Payer: Medicaid Other | Admitting: Dietician

## 2014-05-18 NOTE — Telephone Encounter (Signed)
Work excuse faxed for [employer]

## 2014-05-20 ENCOUNTER — Ambulatory Visit (HOSPITAL_COMMUNITY)
Admission: RE | Admit: 2014-05-20 | Discharge: 2014-05-20 | Disposition: A | Discharge status: Home / Self Care | Payer: Medicaid Other | Source: Ambulatory Visit | Attending: Internal Medicine | Admitting: Internal Medicine

## 2014-05-20 DIAGNOSIS — E119 Type 2 diabetes mellitus without complications: Secondary | ICD-10-CM | POA: Diagnosis not present

## 2014-05-20 DIAGNOSIS — R1084 Generalized abdominal pain: Secondary | ICD-10-CM | POA: Diagnosis not present

## 2014-05-20 DIAGNOSIS — R140 Abdominal distension (gaseous): Secondary | ICD-10-CM | POA: Insufficient documentation

## 2014-07-26 ENCOUNTER — Other Ambulatory Visit: Payer: Self-pay

## 2014-07-26 DIAGNOSIS — Z1231 Encounter for screening mammogram for malignant neoplasm of breast: Secondary | ICD-10-CM

## 2014-07-27 ENCOUNTER — Ambulatory Visit
Admission: RE | Admit: 2014-07-27 | Discharge: 2014-07-27 | Disposition: A | Discharge status: Home / Self Care | Payer: 59 | Source: Ambulatory Visit

## 2014-07-27 DIAGNOSIS — Z1231 Encounter for screening mammogram for malignant neoplasm of breast: Secondary | ICD-10-CM

## 2014-08-12 ENCOUNTER — Ambulatory Visit: Payer: Medicaid Other | Admitting: Internal Medicine

## 2014-10-21 ENCOUNTER — Other Ambulatory Visit: Payer: Self-pay

## 2014-10-21 DIAGNOSIS — R609 Edema, unspecified: Secondary | ICD-10-CM

## 2014-11-04 ENCOUNTER — Inpatient Hospital Stay (HOSPITAL_COMMUNITY)
Admission: EM | Admit: 2014-11-04 | Discharge: 2014-11-06 | DRG: 866 | Disposition: A | Discharge status: Home / Self Care | Payer: 59 | Attending: Family Medicine | Admitting: Family Medicine

## 2014-11-04 ENCOUNTER — Encounter (HOSPITAL_COMMUNITY): Payer: Self-pay | Admitting: Emergency Medicine

## 2014-11-04 ENCOUNTER — Ambulatory Visit: Payer: 59 | Admitting: Internal Medicine

## 2014-11-04 ENCOUNTER — Emergency Department (HOSPITAL_COMMUNITY): Payer: 59

## 2014-11-04 DIAGNOSIS — R51 Headache: Secondary | ICD-10-CM | POA: Diagnosis present

## 2014-11-04 DIAGNOSIS — E119 Type 2 diabetes mellitus without complications: Secondary | ICD-10-CM

## 2014-11-04 DIAGNOSIS — R531 Weakness: Secondary | ICD-10-CM | POA: Diagnosis present

## 2014-11-04 DIAGNOSIS — Z87891 Personal history of nicotine dependence: Secondary | ICD-10-CM

## 2014-11-04 DIAGNOSIS — R519 Headache, unspecified: Secondary | ICD-10-CM

## 2014-11-04 DIAGNOSIS — L4050 Arthropathic psoriasis, unspecified: Secondary | ICD-10-CM | POA: Diagnosis present

## 2014-11-04 DIAGNOSIS — H538 Other visual disturbances: Secondary | ICD-10-CM | POA: Diagnosis present

## 2014-11-04 DIAGNOSIS — R4182 Altered mental status, unspecified: Secondary | ICD-10-CM

## 2014-11-04 DIAGNOSIS — Z79899 Other long term (current) drug therapy: Secondary | ICD-10-CM

## 2014-11-04 DIAGNOSIS — E669 Obesity, unspecified: Secondary | ICD-10-CM | POA: Diagnosis present

## 2014-11-04 DIAGNOSIS — F4310 Post-traumatic stress disorder, unspecified: Secondary | ICD-10-CM | POA: Diagnosis present

## 2014-11-04 DIAGNOSIS — Z6839 Body mass index (BMI) 39.0-39.9, adult: Secondary | ICD-10-CM

## 2014-11-04 DIAGNOSIS — R29810 Facial weakness: Secondary | ICD-10-CM | POA: Diagnosis present

## 2014-11-04 DIAGNOSIS — B349 Viral infection, unspecified: Principal | ICD-10-CM | POA: Diagnosis present

## 2014-11-04 DIAGNOSIS — E876 Hypokalemia: Secondary | ICD-10-CM | POA: Diagnosis present

## 2014-11-04 DIAGNOSIS — B359 Dermatophytosis, unspecified: Secondary | ICD-10-CM | POA: Diagnosis present

## 2014-11-04 DIAGNOSIS — Z801 Family history of malignant neoplasm of trachea, bronchus and lung: Secondary | ICD-10-CM

## 2014-11-04 DIAGNOSIS — I10 Essential (primary) hypertension: Secondary | ICD-10-CM | POA: Diagnosis present

## 2014-11-04 DIAGNOSIS — R55 Syncope and collapse: Secondary | ICD-10-CM | POA: Diagnosis present

## 2014-11-04 DIAGNOSIS — Z8249 Family history of ischemic heart disease and other diseases of the circulatory system: Secondary | ICD-10-CM

## 2014-11-04 DIAGNOSIS — G4459 Other complicated headache syndrome: Secondary | ICD-10-CM

## 2014-11-04 DIAGNOSIS — E785 Hyperlipidemia, unspecified: Secondary | ICD-10-CM | POA: Diagnosis present

## 2014-11-04 HISTORY — DX: Reserved for concepts with insufficient information to code with codable children: IMO0002

## 2014-11-04 HISTORY — DX: Systemic lupus erythematosus, unspecified: M32.9

## 2014-11-04 LAB — COMPREHENSIVE METABOLIC PANEL
ALK PHOS: 71 U/L (ref 38–126)
ALT: 53 U/L (ref 14–54)
ANION GAP: 13 (ref 5–15)
AST: 45 U/L — AB (ref 15–41)
Albumin: 4.3 g/dL (ref 3.5–5.0)
BUN: 19 mg/dL (ref 6–20)
CALCIUM: 9.5 mg/dL (ref 8.9–10.3)
CO2: 25 mmol/L (ref 22–32)
Chloride: 100 mmol/L — ABNORMAL LOW (ref 101–111)
Creatinine, Ser: 0.82 mg/dL (ref 0.44–1.00)
GFR calc Af Amer: >60 mL/min (ref >60)
GFR calc non Af Amer: >60 mL/min (ref >60)
GLUCOSE: 131 mg/dL — AB (ref 65–99)
Potassium: 3.4 mmol/L — ABNORMAL LOW (ref 3.5–5.1)
Sodium: 138 mmol/L (ref 135–145)
Total Bilirubin: 0.6 mg/dL (ref 0.3–1.2)
Total Protein: 7.4 g/dL (ref 6.5–8.1)

## 2014-11-04 LAB — CBC
HEMATOCRIT: 44.2 % (ref 36.0–46.0)
HEMOGLOBIN: 14.1 g/dL (ref 12.0–15.0)
MCH: 24.9 pg — ABNORMAL LOW (ref 26.0–34.0)
MCHC: 31.9 g/dL (ref 30.0–36.0)
MCV: 78.1 fL (ref 78.0–100.0)
Platelets: 137 10*3/uL — ABNORMAL LOW (ref 150–400)
RBC: 5.66 MIL/uL — AB (ref 3.87–5.11)
RDW: 15.9 % — ABNORMAL HIGH (ref 11.5–15.5)
WBC: 10.8 10*3/uL — ABNORMAL HIGH (ref 4.0–10.5)

## 2014-11-04 LAB — URINALYSIS, ROUTINE W REFLEX MICROSCOPIC
Bilirubin Urine: NEGATIVE
Glucose, UA: NEGATIVE mg/dL
Hgb urine dipstick: NEGATIVE
KETONES UR: NEGATIVE mg/dL
Nitrite: NEGATIVE
PH: 5.5 (ref 5.0–8.0)
Protein, ur: NEGATIVE mg/dL
Specific Gravity, Urine: 1.015 (ref 1.005–1.030)
Urobilinogen, UA: 0.2 mg/dL (ref 0.0–1.0)

## 2014-11-04 LAB — RAPID URINE DRUG SCREEN, HOSP PERFORMED
Amphetamines: NOT DETECTED
BARBITURATES: NOT DETECTED
BENZODIAZEPINES: NOT DETECTED
COCAINE: NOT DETECTED
Opiates: NOT DETECTED
TETRAHYDROCANNABINOL: NOT DETECTED

## 2014-11-04 LAB — SEDIMENTATION RATE: SED RATE: 3 mm/h (ref 0–22)

## 2014-11-04 LAB — URINE MICROSCOPIC-ADD ON

## 2014-11-04 LAB — C-REACTIVE PROTEIN: CRP: 2.3 mg/dL — AB (ref <1.0)

## 2014-11-04 LAB — PREGNANCY, URINE: PREG TEST UR: NEGATIVE

## 2014-11-04 LAB — CBG MONITORING, ED: Glucose-Capillary: 134 mg/dL — ABNORMAL HIGH (ref 65–99)

## 2014-11-04 MED ORDER — HYDROMORPHONE HCL 1 MG/ML IJ SOLN
1.0000 mg | Freq: Once | INTRAMUSCULAR | Status: AC
  Administered 2014-11-05: 1 mg via INTRAVENOUS
  Filled 2014-11-04 (×2): qty 1

## 2014-11-04 MED ORDER — DIPHENHYDRAMINE HCL 50 MG/ML IJ SOLN
25.0000 mg | INTRAMUSCULAR | Status: AC
  Administered 2014-11-04: 25 mg via INTRAVENOUS
  Filled 2014-11-04: qty 1

## 2014-11-04 MED ORDER — METOCLOPRAMIDE HCL 5 MG/ML IJ SOLN
10.0000 mg | INTRAMUSCULAR | Status: AC
  Administered 2014-11-04: 10 mg via INTRAVENOUS
  Filled 2014-11-04: qty 2

## 2014-11-04 MED ORDER — GADOBENATE DIMEGLUMINE 529 MG/ML IV SOLN
20.0000 mL | Freq: Once | INTRAVENOUS | Status: AC | PRN
  Administered 2014-11-04: 20 mL via INTRAVENOUS

## 2014-11-04 MED ORDER — SODIUM CHLORIDE 0.9 % IV BOLUS (SEPSIS)
1000.0000 mL | Freq: Once | INTRAVENOUS | Status: AC
  Administered 2014-11-04: 1000 mL via INTRAVENOUS

## 2014-11-04 NOTE — ED Provider Notes (Addendum)
CSN: [id number]     Arrival date & time 11/04/14  1553 History   First MD Initiated Contact with Patient 11/04/14 1559     Chief Complaint  Patient presents with  . Altered Mental Status  . Loss of Consciousness  . Loss of Vision     (Consider location/radiation/quality/duration/timing/severity/associated sxs/prior Treatment) HPI  Pt presenting with c/o feeling generalized weakness with headache over the past week. She states she has hx of lupus, psoriatic arthritis- has been on a prednisone taper for the past 12 days last dose today.  She was seen by her doctor today and there was concern for right eyelid drooping and she was referred to [facility].  She had a CT scan there this morning but then left because she states she didn't want to stay at that hospital.  She was driven here by a friend and in the car had a syncopal episode.  During my evaluation she is awake, alert, tired appearing.  No fever/chills.  Headache was gradual in onset and has been constant for the past week.  She states her vision is blurry and has been for one week.  She states after she left [place] her doctor called her and told her the CT scan of her head was normal but advised that she should still be seen for further testing.  No vomiting.  No changes in speech, no focal weakness.  She has been on prednisone taper due to her butterfly rash worsening.  Has chronic joint pain but denies any increase in pain.  Has chronic hand and feet and abodminal swelling, states her LFTs have been elevated and diagnosed with fatty liver but otherwise her doctors are unsure why LFTs are elevated, this is more chronic in nature.  There are no other associated systemic symptoms, there are no other alleviating or modifying factors.   Past Medical History  Diagnosis Date  . PTSD (post-traumatic stress disorder)   . Hypertension   . Depression   . Hyperlipemia   . Endometriosis   . Anxiety   . DM (diabetes mellitus)   . Arthritis    . Hiatal hernia   . Lupus    Past Surgical History  Procedure Laterality Date  . Partial hysterectomy  07/2009  . Tonsillectomy  2009  . Cesarean section  1997  . Temporomandibular joint surgery  2009    right side  . Oophorectomy Bilateral    Family History  Problem Relation Age of Onset  . Diabetes Maternal Grandfather   . Heart disease Maternal Grandfather   . Hypertension Maternal Grandfather   . Colon cancer Maternal Uncle     great uncle  . Colon polyps Maternal Grandmother   . Lung cancer Maternal Grandmother   . Liver disease Father     agent orange   History  Substance Use Topics  . Smoking status: Former Smoker    Types: Cigarettes    Quit date: 03/09/1999  . Smokeless tobacco: Never Used  . Alcohol Use: No   OB History    Gravida Para Term Preterm AB TAB SAB Ectopic Multiple Living   2 1   1 1    1      Review of Systems  ROS reviewed and all otherwise negative except for mentioned in HPI    Allergies  Nsaids; Penicillins; Codeine; Morphine and related; and Sulfa antibiotics  Home Medications   Prior to Admission medications   Medication Sig Start Date End Date Taking? Authorizing Provider  celecoxib (  CELEBREX) 200 MG capsule Take 200 mg by mouth daily. 10/07/14  Yes Historical Provider, MD  clonazePAM (KLONOPIN) 1 MG tablet Take 1 mg by mouth 4 (four) times daily as needed. For anxiety   Yes Historical Provider, MD  DULoxetine (CYMBALTA) 30 MG capsule Take 1 capsule by mouth daily. 08/05/14  Yes Historical Provider, MD  esomeprazole (NEXIUM) 40 MG capsule Take 40 mg by mouth daily at 12 noon.   Yes Historical Provider, MD  gabapentin (NEURONTIN) 100 MG capsule Take 100 mg by mouth 3 (three) times daily.   Yes Historical Provider, MD  hydrocortisone cream 1 % Apply 1 application topically 2 (two) times daily as needed. For itching   Yes Historical Provider, MD  triamterene-hydrochlorothiazide (MAXZIDE-25) 37.5-25 MG per tablet Take 1 tablet by mouth  daily.   Yes Historical Provider, MD  nystatin (MYCOSTATIN/NYSTOP) 100000 UNIT/GM POWD For use as recommended per package insert to affected areas (under breast) 11/06/14   Velvet Bathe, MD  traMADol (ULTRAM) 50 MG tablet Take 1 tablet (50 mg total) by mouth every 6 (six) hours as needed for moderate pain. 11/06/14   Velvet Bathe, MD   BP 134/81 mmHg  Pulse 101  Temp(Src) 98.9 F (37.2 C) (Oral)  Resp 18  Ht 5' 4"  (1.626 m)  Wt 231 lb 9.6 oz (105.053 kg)  BMI 39.73 kg/m2  SpO2 96%  LMP 06/23/2009  Vitals reviewed Physical Exam  Physical Examination: General appearance - alert, well appearing, and in no distress Mental status - alert, oriented to person, place, and time Eyes - pupils equal and reactive, extraocular eye movements intact Mouth - mucous membranes moist, pharynx normal without lesions Chest - clear to auscultation, no wheezes, rales or rhonchi, symmetric air entry Heart - normal rate, regular rhythm, normal S1, S2, no murmurs, rubs, clicks or gallops Abdomen - soft, nontender, nondistended, no masses or organomegaly Neurological - alert, oriented x 3, mild right sided facial droop- more apparent to family than to me on exam, otherwise cranial nerves intact, strength 5/5 in extremities x 4, sensation intact Extremities - peripheral pulses normal, no pedal edema, no clubbing or cyanosis Skin - normal coloration and turgor, no rashes  ED Course  LUMBAR PUNCTURE Date/Time: 11/08/2014 10:21 AM Performed by: Alfonzo Beers Authorized by: Alfonzo Beers Consent: The procedure was performed in an emergent situation. Verbal consent obtained. Risks and benefits: risks, benefits and alternatives were discussed Consent given by: patient Patient understanding: patient states understanding of the procedure being performed Patient consent: the patient's understanding of the procedure matches consent given Procedure consent: procedure consent matches procedure scheduled Relevant  documents: relevant documents present and verified Test results: test results available and properly labeled Imaging studies: imaging studies available Patient identity confirmed: verbally with patient and arm band Time out: Immediately prior to procedure a "time out" was called to verify the correct patient, procedure, equipment, support staff and site/side marked as required. Indications: evaluation for infection and evaluation for altered mental status Anesthesia: local infiltration Local anesthetic: lidocaine 1% with epinephrine Anesthetic total: 5 ml Patient sedated: no Preparation: Patient was prepped and draped in the usual sterile fashion. Lumbar space: L4-L5 interspace Patient's position: left lateral decubitus Needle gauge: 20 Needle type: spinal needle - Quincke tip Number of attempts: 1 Post-procedure: site cleaned and adhesive bandage applied Patient tolerance: Patient tolerated the procedure well with no immediate complications Comments: No fluid obtained   (including critical care time) Labs Review Labs Reviewed  COMPREHENSIVE METABOLIC PANEL - Abnormal; Notable  for the following:    Potassium 3.4 (*)    Chloride 100 (*)    Glucose, Bld 131 (*)    AST 45 (*)    All other components within normal limits  CBC - Abnormal; Notable for the following:    WBC 10.8 (*)    RBC 5.66 (*)    MCH 24.9 (*)    RDW 15.9 (*)    Platelets 137 (*)    All other components within normal limits  C-REACTIVE PROTEIN - Abnormal; Notable for the following:    CRP 2.3 (*)    All other components within normal limits  URINALYSIS, ROUTINE W REFLEX MICROSCOPIC (NOT AT [facility]) - Abnormal; Notable for the following:    APPearance CLOUDY (*)    Leukocytes, UA SMALL (*)    All other components within normal limits  URINE MICROSCOPIC-ADD ON - Abnormal; Notable for the following:    Bacteria, UA MANY (*)    All other components within normal limits  BASIC METABOLIC PANEL - Abnormal; Notable  for the following:    Potassium 3.3 (*)    BUN 21 (*)    All other components within normal limits  CBC - Abnormal; Notable for the following:    RBC 5.22 (*)    MCH 24.5 (*)    RDW 16.1 (*)    All other components within normal limits  PROTEIN, CSF - Abnormal; Notable for the following:    Total  Protein, CSF 50 (*)    All other components within normal limits  CSF CELL COUNT WITH DIFFERENTIAL - Abnormal; Notable for the following:    RBC Count, CSF 53 (*)    All other components within normal limits  CBG MONITORING, ED - Abnormal; Notable for the following:    Glucose-Capillary 134 (*)    All other components within normal limits  CSF CULTURE  CULTURE, FUNGUS WITHOUT SMEAR  FUNGAL STAIN  CSF CULTURE  AFB CULTURE WITH SMEAR  FUNGUS CULTURE W SMEAR  SEDIMENTATION RATE  PREGNANCY, URINE  URINE RAPID DRUG SCREEN, HOSP PERFORMED  MAGNESIUM  GLUCOSE, CSF  HERPES SIMPLEX VIRUS(HSV) DNA BY PCR    Imaging Review No results found.   EKG Interpretation   Date/Time:  Friday November 04 2014 15:57:39 EDT Ventricular Rate:  93 PR Interval:  177 QRS Duration: 79 QT Interval:  350 QTC Calculation: 435 R Axis:   72 Text Interpretation:  Sinus rhythm Low voltage, precordial leads  Borderline T abnormalities, anterior leads No significant change since  last tracing Confirmed by [facility]  MD, Danne Scardina [phone]) on 11/04/2014 5:09:04  PM      MDM   Final diagnoses:  Headache      7:15 PM d/w neurology, Dr. Janann Colonel, he recommends MR brain with and without contrast to further evaluate for lupus optic neuropathy, also MRV to further evaluate for venous sinus thrombosis.  These have been ordered.    8:09 PM neurology has seen patient, will touch base again once MRI has been obtained.   12:38 AM d/w neurology again, MR results reassuring.  He recommends admission to hospitalist service and LP.  I have attempted LP and was unable to obtain CSF. Dr. Janann Colonel states this is all right to be  done by IR tomorrow- does not need to be obtained emergently tonight.  He has ordered the CSF studies that he recommends.  Paging hosptialist service now.   Alfonzo Beers, MD 11/08/14 [place], MD 11/08/14 1022

## 2014-11-04 NOTE — ED Notes (Addendum)
Pt states she's been very sick over the last week. States she began having a horrible headache that started a couple days ago. Went to her Doctors office today, MD noticed right eye drooping, and was referred to get a scan of her brain. After getting that was called and told by her MD to go straight to the ER. Upon arrival to ER pt lost conciousness in the car. Arrousable, lethargic, pupils dilated to 4

## 2014-11-04 NOTE — ED Notes (Signed)
CBG 134 mg/dl

## 2014-11-04 NOTE — ED Notes (Signed)
Patient transported to CT 

## 2014-11-04 NOTE — Consult Note (Addendum)
Consult Reason for Consult: Referring Physician: Dr Bridgett Larsson ED  CC: headache and fatigue  HPI: Melinda Porter is an 44 y.o. female hx of lupus, HTN presenting with c/o feeling generalized weakness with headache over the past week. Describes as a generalized headache, pounding type pain. + associated nausea and phonophobia. + Blurred vision in her right eye. Family also notes question of droop in right eye. Notes weakness and heavy sensation in her RUE and some paresthesias/numbness in RUE. Family reports she seems more lethargic and confused today. Note some slurred speech. Had episode of loss of consciousness today while driving to the ED. No seizure activity/abnormal movements noted. Unclear of duration of unconsciousness.   Originally diagnosed with psoriatic arthritis, was started on Humira which was stopped around 1 month ago and her diagnosis was changed to Lupus. Recently given 12 day course of prednisone taper (finished today).   Had CT scan, imaging reviewed, which was unremarkable. Labs pertinent for elevated CRP, elevated AST and elevated WBC (though recently finished prednisone taper).    Past Medical History  Diagnosis Date  . PTSD (post-traumatic stress disorder)   . Hypertension   . Depression   . Hyperlipemia   . Endometriosis   . Anxiety   . DM (diabetes mellitus)   . Arthritis   . Hiatal hernia   . Lupus     Past Surgical History  Procedure Laterality Date  . Partial hysterectomy  07/2009  . Tonsillectomy  2009  . Cesarean section  1997  . Temporomandibular joint surgery  2009    right side  . Oophorectomy Bilateral     Family History  Problem Relation Age of Onset  . Diabetes Maternal Grandfather   . Heart disease Maternal Grandfather   . Hypertension Maternal Grandfather   . Colon cancer Maternal Uncle     great uncle  . Colon polyps Maternal Grandmother   . Lung cancer Maternal Grandmother   . Liver disease Father     agent orange    Social  History:  reports that she quit smoking about 15 years ago. Her smoking use included Cigarettes. She has never used smokeless tobacco. She reports that she does not drink alcohol or use illicit drugs.  Allergies  Allergen Reactions  . Nsaids Other (See Comments)    Liver problems- can't take tylenol, ibuprofen etc.  . Penicillins   . Codeine Rash  . Morphine And Related Rash  . Sulfa Antibiotics Rash    Medications: Scheduled:  ROS: Out of a complete 14 system review, the patient complains of only the following symptoms, and all other reviewed systems are negative. + headache, weakness  Physical Examination: Filed Vitals:   11/04/14 2005  BP: 108/65  Pulse: 83  Temp:   Resp: 19   Physical Exam  Constitutional: He appears well-developed and well-nourished.  Psych: Affect appropriate to situation Eyes: No scleral injection HENT: No OP obstrucion Head: Normocephalic.  Cardiovascular: Normal rate and regular rhythm.  Respiratory: Effort normal and breath sounds normal.  GI: Soft. Bowel sounds are normal. No distension. There is no tenderness.  Skin: WDI  Neurologic Examination Mental Status: Alert, oriented, thought content appropriate.  Speech fluent without evidence of aphasia. No dysarthria. Able to follow 3 step commands without difficulty. Cranial Nerves: II: unable to fully visualize optic discs due to patients light sensitivity, visual fields grossly normal, pupils equal, round, reactive to light and accommodation III,IV, VI: ptosis not present, extra-ocular motions intact bilaterally V,VII: decreased activation on  the right (though question full effort), facial light touch sensation decreased on the right (V1-V3) VIII: hearing normal bilaterally IX,X: gag reflex present XI: trapezius strength/neck flexion strength normal bilaterally XII: tongue strength normal  Motor: Right : Upper extremity    Left:     Upper extremity 5-/5 deltoid       5/5 deltoid 5-/5  biceps      5/5 biceps  5-/5 triceps      5/5 triceps 4+/5 hand grip      5/5 hand grip *appears to be pain related weakness  Lower extremity     Lower extremity 5/5 hip flexor      5/5 hip flexor 5/5 quadricep      5/5 quadriceps  5/5 hamstrings     5/5 hamstrings 5/5 plantar flexion       5/5 plantar flexion 5/5 plantar extension     5/5 plantar extension Tone and bulk:normal tone throughout; no atrophy noted Sensory: decreased PP and LT RUE and RLE Deep Tendon Reflexes: 2+ and symmetric throughout Plantars: Right: downgoing   Left: downgoing Cerebellar: normal finger-to-nose,  and normal heel-to-shin test Gait: deferred  Laboratory Studies:   Basic Metabolic Panel:  Recent Labs Lab 11/04/14 1632  NA 138  K 3.4*  CL 100*  CO2 25  GLUCOSE 131*  BUN 19  CREATININE 0.82  CALCIUM 9.5    Liver Function Tests:  Recent Labs Lab 11/04/14 1632  AST 45*  ALT 53  ALKPHOS 71  BILITOT 0.6  PROT 7.4  ALBUMIN 4.3   No results for input(s): LIPASE, AMYLASE in the last 168 hours. No results for input(s): AMMONIA in the last 168 hours.  CBC:  Recent Labs Lab 11/04/14 1632  WBC 10.8*  HGB 14.1  HCT 44.2  MCV 78.1  PLT 137*    Cardiac Enzymes: No results for input(s): CKTOTAL, CKMB, CKMBINDEX, TROPONINI in the last 168 hours.  BNP: Invalid input(s): POCBNP  CBG:  Recent Labs Lab 11/04/14 1628  GLUCAP 134*    Microbiology: Results for orders placed or performed in visit on 33/29/51  Helicobacter pylori screen-biopsy     Status: None   Collection Time: 05/10/14  9:03 AM  Result Value Ref Range Status   UREASE Negative Negative Final    Coagulation Studies: No results for input(s): LABPROT, INR in the last 72 hours.  Urinalysis:  Recent Labs Lab 11/04/14 1714  COLORURINE YELLOW  LABSPEC 1.015  PHURINE 5.5  GLUCOSEU NEGATIVE  HGBUR NEGATIVE  BILIRUBINUR NEGATIVE  KETONESUR NEGATIVE  PROTEINUR NEGATIVE  UROBILINOGEN 0.2  NITRITE NEGATIVE   LEUKOCYTESUR SMALL*    Lipid Panel:  No results found for: CHOL, TRIG, HDL, CHOLHDL, VLDL, LDLCALC  HgbA1C: No results found for: HGBA1C  Urine Drug Screen:     Component Value Date/Time   LABOPIA NONE DETECTED 11/04/2014 1714   COCAINSCRNUR NONE DETECTED 11/04/2014 1714   LABBENZ NONE DETECTED 11/04/2014 1714   AMPHETMU NONE DETECTED 11/04/2014 1714   THCU NONE DETECTED 11/04/2014 1714   LABBARB NONE DETECTED 11/04/2014 1714    Alcohol Level: No results for input(s): ETH in the last 168 hours.  Other results:  Imaging: Ct Head Wo Contrast  11/04/2014   CLINICAL DATA:  Headache, blurry vision  EXAM: CT HEAD WITHOUT CONTRAST  TECHNIQUE: Contiguous axial images were obtained from the base of the skull through the vertex without intravenous contrast.  COMPARISON:  [facility] head CT dated 11/04/2014 at 1429 hours.  FINDINGS: No evidence of parenchymal hemorrhage  or extra-axial fluid collection. No mass lesion, mass effect, or midline shift.  No CT evidence of acute infarction.  Cerebral volume is within normal limits.  No ventriculomegaly.  The visualized paranasal sinuses are essentially clear. The mastoid air cells are unopacified.  No evidence of calvarial fracture.  IMPRESSION: Normal head CT.  No interval change from head CT earlier today.   Electronically Signed   By: Julian Hy [name]   On: 11/04/2014 16:47     Assessment/Plan:  44y/o woman hx of HTN and Lupus presenting with one week of headache, blurred vision and right sided deficits. Broad differential includes lupus flare up/autoimmune disorder vs ischemic infarct vs dural sinus thrombosis. Mild leukocytosis (likely due to steroid usage) but afebrile and no nuchal rigidity making infectious etiology less likely.   -MRI brain with and without and MRV brain -depending on MRI results may need to consider lumbar puncture -suggest migraine cocktail for headache relief -further workup and plan pending above  results   Addendum: MRI and MRV reviewed and were unremarkable. Discussed findings with ED attending, Dr Canary Brim. Based on unclear nature of presentation would suggest lumbar puncture to rule out infectious etiology and check opening pressure for possible idiopathic intracranial hypertension. For LP will need to check opening pressure.   Jim Like, DO [facility] [phone]  If 7pm- 7am, please page neurology on call as listed in [place]. 11/04/2014, 8:27 PM

## 2014-11-05 ENCOUNTER — Observation Stay (HOSPITAL_COMMUNITY): Payer: 59

## 2014-11-05 DIAGNOSIS — E119 Type 2 diabetes mellitus without complications: Secondary | ICD-10-CM

## 2014-11-05 DIAGNOSIS — H538 Other visual disturbances: Secondary | ICD-10-CM | POA: Diagnosis present

## 2014-11-05 DIAGNOSIS — R55 Syncope and collapse: Secondary | ICD-10-CM | POA: Diagnosis present

## 2014-11-05 DIAGNOSIS — B349 Viral infection, unspecified: Secondary | ICD-10-CM | POA: Diagnosis present

## 2014-11-05 DIAGNOSIS — R51 Headache: Secondary | ICD-10-CM | POA: Diagnosis present

## 2014-11-05 DIAGNOSIS — E118 Type 2 diabetes mellitus with unspecified complications: Secondary | ICD-10-CM

## 2014-11-05 DIAGNOSIS — E876 Hypokalemia: Secondary | ICD-10-CM

## 2014-11-05 DIAGNOSIS — R531 Weakness: Secondary | ICD-10-CM | POA: Diagnosis present

## 2014-11-05 DIAGNOSIS — R29810 Facial weakness: Secondary | ICD-10-CM | POA: Diagnosis present

## 2014-11-05 DIAGNOSIS — Z79899 Other long term (current) drug therapy: Secondary | ICD-10-CM | POA: Diagnosis not present

## 2014-11-05 DIAGNOSIS — R519 Headache, unspecified: Secondary | ICD-10-CM | POA: Insufficient documentation

## 2014-11-05 DIAGNOSIS — B359 Dermatophytosis, unspecified: Secondary | ICD-10-CM | POA: Diagnosis present

## 2014-11-05 DIAGNOSIS — L4050 Arthropathic psoriasis, unspecified: Secondary | ICD-10-CM | POA: Diagnosis present

## 2014-11-05 DIAGNOSIS — Z8249 Family history of ischemic heart disease and other diseases of the circulatory system: Secondary | ICD-10-CM | POA: Diagnosis not present

## 2014-11-05 DIAGNOSIS — Z6839 Body mass index (BMI) 39.0-39.9, adult: Secondary | ICD-10-CM | POA: Diagnosis not present

## 2014-11-05 DIAGNOSIS — Z87891 Personal history of nicotine dependence: Secondary | ICD-10-CM | POA: Diagnosis not present

## 2014-11-05 DIAGNOSIS — I10 Essential (primary) hypertension: Secondary | ICD-10-CM | POA: Diagnosis present

## 2014-11-05 DIAGNOSIS — E785 Hyperlipidemia, unspecified: Secondary | ICD-10-CM

## 2014-11-05 DIAGNOSIS — E669 Obesity, unspecified: Secondary | ICD-10-CM | POA: Diagnosis present

## 2014-11-05 DIAGNOSIS — F4310 Post-traumatic stress disorder, unspecified: Secondary | ICD-10-CM | POA: Diagnosis present

## 2014-11-05 DIAGNOSIS — Z801 Family history of malignant neoplasm of trachea, bronchus and lung: Secondary | ICD-10-CM | POA: Diagnosis not present

## 2014-11-05 LAB — CSF CELL COUNT WITH DIFFERENTIAL
RBC Count, CSF: 53 /mm3 — ABNORMAL HIGH
Tube #: 4
WBC CSF: 0 /mm3 (ref 0–5)

## 2014-11-05 LAB — PROTEIN, CSF: TOTAL PROTEIN, CSF: 50 mg/dL — AB (ref 15–45)

## 2014-11-05 LAB — GLUCOSE, CSF: Glucose, CSF: 64 mg/dL (ref 40–70)

## 2014-11-05 LAB — BASIC METABOLIC PANEL
ANION GAP: 9 (ref 5–15)
BUN: 21 mg/dL — AB (ref 6–20)
CO2: 29 mmol/L (ref 22–32)
CREATININE: 0.77 mg/dL (ref 0.44–1.00)
Calcium: 8.9 mg/dL (ref 8.9–10.3)
Chloride: 104 mmol/L (ref 101–111)
GFR calc Af Amer: >60 mL/min (ref >60)
GFR calc non Af Amer: >60 mL/min (ref >60)
Glucose, Bld: 96 mg/dL (ref 65–99)
POTASSIUM: 3.3 mmol/L — AB (ref 3.5–5.1)
Sodium: 142 mmol/L (ref 135–145)

## 2014-11-05 LAB — CBC
HCT: 41.5 % (ref 36.0–46.0)
HEMOGLOBIN: 12.8 g/dL (ref 12.0–15.0)
MCH: 24.5 pg — ABNORMAL LOW (ref 26.0–34.0)
MCHC: 30.8 g/dL (ref 30.0–36.0)
MCV: 79.5 fL (ref 78.0–100.0)
PLATELETS: 158 10*3/uL (ref 150–400)
RBC: 5.22 MIL/uL — AB (ref 3.87–5.11)
RDW: 16.1 % — ABNORMAL HIGH (ref 11.5–15.5)
WBC: 9.6 10*3/uL (ref 4.0–10.5)

## 2014-11-05 LAB — MAGNESIUM: Magnesium: 2.2 mg/dL (ref 1.7–2.4)

## 2014-11-05 MED ORDER — ACETAMINOPHEN 650 MG RE SUPP: 650.0000 mg | Freq: Four times a day (QID) | RECTAL | Status: DC | PRN

## 2014-11-05 MED ORDER — PROCHLORPERAZINE EDISYLATE 5 MG/ML IJ SOLN
10.0000 mg | Freq: Once | INTRAMUSCULAR | Status: AC
  Administered 2014-11-05: 10 mg via INTRAVENOUS
  Filled 2014-11-05: qty 2

## 2014-11-05 MED ORDER — DULOXETINE HCL 30 MG PO CPEP
30.0000 mg | ORAL_CAPSULE | Freq: Every day | ORAL | Status: DC
  Administered 2014-11-05 – 2014-11-06 (×2): 30 mg via ORAL
  Filled 2014-11-05 (×2): qty 1

## 2014-11-05 MED ORDER — ONDANSETRON HCL 4 MG PO TABS: 4.0000 mg | ORAL_TABLET | Freq: Four times a day (QID) | ORAL | Status: DC | PRN

## 2014-11-05 MED ORDER — NYSTATIN 100000 UNIT/GM EX POWD
Freq: Three times a day (TID) | CUTANEOUS | Status: DC
  Administered 2014-11-05 – 2014-11-06 (×3): via TOPICAL
  Filled 2014-11-05: qty 15

## 2014-11-05 MED ORDER — SODIUM CHLORIDE 0.9 % IV SOLN
INTRAVENOUS | Status: DC
  Administered 2014-11-05 – 2014-11-06 (×3): via INTRAVENOUS

## 2014-11-05 MED ORDER — HYDROMORPHONE HCL 1 MG/ML IJ SOLN
0.5000 mg | INTRAMUSCULAR | Status: DC | PRN
  Administered 2014-11-05 – 2014-11-06 (×6): 1 mg via INTRAVENOUS
  Filled 2014-11-05 (×6): qty 1

## 2014-11-05 MED ORDER — ONDANSETRON HCL 4 MG/2ML IJ SOLN
4.0000 mg | Freq: Once | INTRAMUSCULAR | Status: AC
  Administered 2014-11-05: 4 mg via INTRAVENOUS
  Filled 2014-11-05: qty 2

## 2014-11-05 MED ORDER — MAGNESIUM SULFATE 2 GM/50ML IV SOLN
2.0000 g | Freq: Once | INTRAVENOUS | Status: AC
  Administered 2014-11-05: 2 g via INTRAVENOUS
  Filled 2014-11-05: qty 50

## 2014-11-05 MED ORDER — PANTOPRAZOLE SODIUM 40 MG PO TBEC
40.0000 mg | DELAYED_RELEASE_TABLET | Freq: Every day | ORAL | Status: DC
  Administered 2014-11-05 – 2014-11-06 (×2): 40 mg via ORAL
  Filled 2014-11-05 (×2): qty 1

## 2014-11-05 MED ORDER — PNEUMOCOCCAL VAC POLYVALENT 25 MCG/0.5ML IJ INJ
0.5000 mL | INJECTION | INTRAMUSCULAR | Status: DC
  Filled 2014-11-05 (×2): qty 0.5

## 2014-11-05 MED ORDER — CETYLPYRIDINIUM CHLORIDE 0.05 % MT LIQD
7.0000 mL | Freq: Two times a day (BID) | OROMUCOSAL | Status: DC
  Administered 2014-11-05 – 2014-11-06 (×3): 7 mL via OROMUCOSAL

## 2014-11-05 MED ORDER — ALUM & MAG HYDROXIDE-SIMETH 200-200-20 MG/5ML PO SUSP: 30.0000 mL | Freq: Four times a day (QID) | ORAL | Status: DC | PRN

## 2014-11-05 MED ORDER — LORCASERIN HCL 10 MG PO TABS: 10.0000 mg | ORAL_TABLET | Freq: Two times a day (BID) | ORAL | Status: DC

## 2014-11-05 MED ORDER — ACETAMINOPHEN 325 MG PO TABS: 650.0000 mg | ORAL_TABLET | Freq: Four times a day (QID) | ORAL | Status: DC | PRN

## 2014-11-05 MED ORDER — ONDANSETRON HCL 4 MG/2ML IJ SOLN
4.0000 mg | Freq: Four times a day (QID) | INTRAMUSCULAR | Status: DC | PRN
Start: 2014-11-05 — End: 2014-11-06

## 2014-11-05 MED ORDER — LORCASERIN HCL 10 MG PO TABS
10.0000 mg | ORAL_TABLET | Freq: Two times a day (BID) | ORAL | Status: DC
  Filled 2014-11-05 (×2): qty 1

## 2014-11-05 MED ORDER — CLONAZEPAM 1 MG PO TABS: 1.0000 mg | ORAL_TABLET | Freq: Four times a day (QID) | ORAL | Status: DC | PRN

## 2014-11-05 MED ORDER — PREDNISONE 5 MG PO TABS: 5.0000 mg | ORAL_TABLET | ORAL | Status: DC

## 2014-11-05 MED ORDER — GABAPENTIN 100 MG PO CAPS
100.0000 mg | ORAL_CAPSULE | Freq: Three times a day (TID) | ORAL | Status: DC
Start: 2014-11-05 — End: 2014-11-06
  Administered 2014-11-05 – 2014-11-06 (×4): 100 mg via ORAL
  Filled 2014-11-05 (×4): qty 1

## 2014-11-05 MED ORDER — POTASSIUM CHLORIDE 10 MEQ/100ML IV SOLN
10.0000 meq | INTRAVENOUS | Status: AC
  Administered 2014-11-05 (×3): 10 meq via INTRAVENOUS
  Filled 2014-11-05: qty 100

## 2014-11-05 MED ORDER — SODIUM CHLORIDE 0.9 % IJ SOLN
3.0000 mL | Freq: Two times a day (BID) | INTRAMUSCULAR | Status: DC
  Administered 2014-11-05: 3 mL via INTRAVENOUS

## 2014-11-05 NOTE — Progress Notes (Signed)
Patient seen and evaluated earlier this AM by my associate. Please refer to H and P for details regarding assessment and plan.  Neurology on board and LP performed. Currently awaiting work up results   General: patient in nad, alert and awake CV: no cyanosis or clubbing Pulm: breathing comfortably on room air  Will reassess next am.  Velvet Bathe

## 2014-11-05 NOTE — ED Notes (Signed)
Dr. Jenkins at bedside. 

## 2014-11-05 NOTE — H&P (Signed)
[facility] Admission History and Physical       Melinda Porter RKY:[id number] DOB: 20-Feb-1971 DOA: 11/04/2014  Referring physician:  PCP: Melinda Riches, NP  Specialists:   Chief Complaint:   HPI: Melinda Porter is a 44 y.o. 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   She states that she has felt sick all week.   She saw her PCP and was found to have the Right eye lid drooping and was referred to the [facility] ED where a CT scan of the head was performed and was negative for acute findings.  She did not want to stay at the [facility] and she had her friend drive her to the [place] ED where she reportedly had a syncopal episode on route.     She denies any history of Migraine headaches.   She reports having low grade fever.  An LP was attempted without success by the EDP.   Neurology was consulted and she was evaluated by Neurology Dr Janann Colonel, and an MRI of the brain was performed and was negative for acute findings.   A Lumbar Puncture by IR was recommended.     Review of Systems:  Constitutional: No Weight Loss, No Weight Gain, Night Sweats, +/-Fevers, Chills, Dizziness, Light Headedness, Fatigue, or Generalized Weakness HEENT: +Headaches, Difficulty Swallowing,Tooth/Dental Problems,Sore Throat,  No Sneezing, Rhinitis, Ear Ache, Nasal Congestion, or Post Nasal Drip,  Cardio-vascular:  No Chest pain, Orthopnea, PND, Edema in Lower Extremities, Anasarca, Dizziness, Palpitations  Resp: No Dyspnea, No DOE, No Productive Cough, No Non-Productive Cough, No Hemoptysis, No Wheezing.    GI: No Heartburn, Indigestion, Abdominal Pain, Nausea, Vomiting, Diarrhea, Constipation, Hematemesis, Hematochezia, Melena, Change in Bowel Habits,  Loss of Appetite    GU: No Dysuria, No Change in Color of Urine, No Urgency or Urinary Frequency, No Flank pain.  Musculoskeletal: No Joint Pain or Swelling, No Decreased Range of Motion, No Back Pain.  Neurologic: +Syncope, No Seizures, Muscle Weakness, Paresthesia, Vision Disturbance or Loss, No Diplopia, No Vertigo, No Difficulty Walking,  Skin: No Rash or Lesions. Psych: No Change in Mood or Affect, No Depression or Anxiety, No Memory loss, No Confusion, or Hallucinations   Past Medical History  Diagnosis Date  . PTSD (post-traumatic stress disorder)   . Hypertension   . Depression   . Hyperlipemia   . Endometriosis   . Anxiety   . DM (diabetes mellitus)   . Arthritis   . Hiatal hernia   . Lupus      Past Surgical History  Procedure Laterality Date  . Partial hysterectomy  07/2009  . Tonsillectomy  2009  . Cesarean section  1997  . Temporomandibular joint surgery  2009    right side  . Oophorectomy Bilateral       Prior to Admission medications   Medication Sig Start Date End Date Taking? Authorizing Provider  BELVIQ 10 MG TABS Take 1 tablet by mouth 2 (two) times daily. 10/07/14  Yes Historical Provider, MD  celecoxib (CELEBREX) 200 MG capsule Take 200 mg by mouth daily. 10/07/14  Yes Historical Provider, MD  clonazePAM (KLONOPIN) 1 MG tablet Take 1 mg by mouth 4 (four) times daily as needed. For anxiety   Yes Historical Provider, MD  DULoxetine (CYMBALTA) 30 MG  capsule Take 1 capsule by mouth daily. 08/05/14  Yes Historical Provider, MD  esomeprazole (NEXIUM) 40 MG capsule Take 40 mg by mouth daily at 12 noon.   Yes Historical Provider, MD  gabapentin (NEURONTIN) 100 MG capsule Take 100 mg by mouth 3 (three) times daily.   Yes Historical Provider, MD  hydrocortisone cream 1 % Apply 1 application topically 2 (two) times daily as needed. For itching   Yes Historical Provider, MD  sertraline (ZOLOFT) 100 MG tablet Take 1 tablet by mouth at bedtime. 11/04/14  Yes Historical Provider, MD   triamterene-hydrochlorothiazide (MAXZIDE-25) 37.5-25 MG per tablet Take 1 tablet by mouth daily.   Yes Historical Provider, MD  ciprofloxacin (CIPRO) 500 MG tablet Take 1 tablet by mouth 2 (two) times daily. 7 day course 11/04/14   Historical Provider, MD  predniSONE (DELTASONE) 5 MG tablet Take 1-4 tablets by mouth as directed. 12 day dose pack. Took last pill 11/04/14 10/25/14   Historical Provider, MD     Allergies  Allergen Reactions  . Nsaids Other (See Comments)    Liver problems- can't take tylenol, ibuprofen etc.  . Penicillins   . Codeine Rash  . Morphine And Related Rash  . Sulfa Antibiotics Rash    Social History:  reports that she quit smoking about 15 years ago. Her smoking use included Cigarettes. She has never used smokeless tobacco. She reports that she does not drink alcohol or use illicit drugs.    Family History  Problem Relation Age of Onset  . Diabetes Maternal Grandfather   . Heart disease Maternal Grandfather   . Hypertension Maternal Grandfather   . Colon cancer Maternal Uncle     great uncle  . Colon polyps Maternal Grandmother   . Lung cancer Maternal Grandmother   . Liver disease Father     agent orange       Physical Exam:  GEN:  Pleasant Obese  44 y.o. Caucasian female examined and in no acute distress; cooperative with exam Filed Vitals:   11/04/14 2303 11/05/14 0035 11/05/14 0255 11/05/14 0529  BP: 115/71 128/91 99/45 119/49  Pulse: 83 70 76 73  Temp:   98 F (36.7 C) 97.6 F (36.4 C)  TempSrc:   Oral Oral  Resp: 13 23 18 18   Height:   5' 4"  (1.626 m)   Weight:   105.053 kg (231 lb 9.6 oz)   SpO2: 96% 95% 96% 94%   Blood pressure 119/49, pulse 73, temperature 97.6 F (36.4 C), temperature source Oral, resp. rate 18, height 5' 4"  (1.626 m), weight 105.053 kg (231 lb 9.6 oz), last menstrual period 06/23/2009, SpO2 94 %. PSYCH: She is alert and oriented x4; does not appear anxious does not appear depressed; affect is normal HEENT:  Normocephalic and Atraumatic, Mucous membranes pink; PERRLA; EOM intact; Fundi:  Benign;  No scleral icterus, Nares: Patent, Oropharynx: Clear,  Fair Dentition,    Neck:  FROM, No Cervical Lymphadenopathy nor Thyromegaly or Carotid Bruit; No JVD; Breasts:: Not examined CHEST WALL: No tenderness CHEST: Normal respiration, clear to auscultation bilaterally HEART: Regular rate and rhythm; no murmurs rubs or gallops BACK: No kyphosis or scoliosis; No CVA tenderness ABDOMEN: Positive Bowel Sounds, Scaphoid, Obese, Soft Non-Tender, No Rebound or Guarding; No Masses, No Organomegaly, No Pannus; No Intertriginous candida. Rectal Exam: Not done EXTREMITIES: No  Cyanosis, Clubbing, or Edema; No Ulcerations. Genitalia: not examined PULSES: 2+ and symmetric SKIN: Normal hydration no rash or ulceration  CNS:  Alert and Oriented x  4, No Focal Deficits  Mental Status:  Alert, Oriented, Thought Content Appropriate. Speech Fluent without evidence of Aphasia. Able to follow 3 step commands without difficulty.  In No obvious pain.   Cranial Nerves:  II: Discs flat bilaterally; Visual fields Intact, No Decreased peripheral vision to the left or right. Pupils equal and reactive.    III,IV, VI: Extra-ocular motions intact bilaterally    V,VII: smile symmetric, facial light touch sensation normal bilaterally    VIII: hearing intact bilaterally    IX,X: gag reflex present    XI: bilateral shoulder shrug    XII: midline tongue extension   Motor:  Right:  Upper extremity 5-/5     Left:  Upper extremity 5/5     Right:  Lower extremity 5/5    Left:  Lower extremity 5/5     Tone and Bulk:  normal tone throughout; no atrophy noted   Sensory:  Pinprick and light touch intact throughout, bilaterally   Deep Tendon Reflexes: 2+ and symmetric throughout   Plantars/ Babinski:  Right: normal Left: normal    Cerebellar:  Finger to nose without difficulty.   Gait: deferred    Vascular: pulses palpable throughout     Labs on Admission:  Basic Metabolic Panel:  Recent Labs Lab 11/04/14 1632 11/05/14 0510  NA 138 142  K 3.4* 3.3*  CL 100* 104  CO2 25 29  GLUCOSE 131* 96  BUN 19 21*  CREATININE 0.82 0.77  CALCIUM 9.5 8.9   Liver Function Tests:  Recent Labs Lab 11/04/14 1632  AST 45*  ALT 53  ALKPHOS 71  BILITOT 0.6  PROT 7.4  ALBUMIN 4.3   No results for input(s): LIPASE, AMYLASE in the last 168 hours. No results for input(s): AMMONIA in the last 168 hours. CBC:  Recent Labs Lab 11/04/14 1632 11/05/14 0510  WBC 10.8* 9.6  HGB 14.1 12.8  HCT 44.2 41.5  MCV 78.1 79.5  PLT 137* 158   Cardiac Enzymes: No results for input(s): CKTOTAL, CKMB, CKMBINDEX, TROPONINI in the last 168 hours.  BNP (last 3 results) No results for input(s): BNP in the last 8760 hours.  ProBNP (last 3 results) No results for input(s): PROBNP in the last 8760 hours.  CBG:  Recent Labs Lab 11/04/14 1628  GLUCAP 134*    Radiological Exams on Admission: Ct Head Wo Contrast  11/04/2014   CLINICAL DATA:  Headache, blurry vision  EXAM: CT HEAD WITHOUT CONTRAST  TECHNIQUE: Contiguous axial images were obtained from the base of the skull through the vertex without intravenous contrast.  COMPARISON:  [place] head CT dated 11/04/2014 at 1429 hours.  FINDINGS: No evidence of parenchymal hemorrhage or extra-axial fluid collection. No mass lesion, mass effect, or midline shift.  No CT evidence of acute infarction.  Cerebral volume is within normal limits.  No ventriculomegaly.  The visualized paranasal sinuses are essentially clear. The mastoid air cells are unopacified.  No evidence of calvarial fracture.  IMPRESSION: Normal head CT.  No interval change from head CT earlier today.   Electronically Signed   By: Julian Hy [name]   On: 11/04/2014 16:47   Mr Jeri Cos YQ Contrast  11/04/2014   CLINICAL DATA:  Initial evaluation for headache. History of lupus, hypertension.  EXAM: MRI HEAD WITHOUT AND WITH  CONTRAST  MRV HEAD WITHOUT AND WITH CONTRAST  TECHNIQUE: Multiplanar, multiecho pulse sequences of the brain and surrounding structures were obtained without and with intravenous contrast. Angiographic images of the intracranial  venous structures were obtained using MRV technique without intravenous contrast.  CONTRAST:  51m MULTIHANCE GADOBENATE DIMEGLUMINE 529 MG/ML IV SOLN  COMPARISON:  Prior CT from earlier the same day.  FINDINGS: MRI HEAD FINDINGS:  Cerebral volume within normal limits for patient age. A few tiny scattered subcentimeter T2/FLAIR hyperintense foci noted within the periventricular and deep white matter of the right frontal lobe, nonspecific, but felt to be within normal limits for patient age.  No evidence for acute intracranial infarct. Gray-white matter differentiation is maintained. Normal intravascular flow voids are preserved. No acute or chronic intracranial hemorrhage.  No mass lesion, mass effect, or midline shift. No hydrocephalus. No extra-axial fluid collection. No abnormal enhancement.  Craniocervical junction within normal limits. Visualized upper cervical spine unremarkable. Pituitary gland within normal limits. No acute abnormality about the orbits.  Paranasal sinuses are well pneumatized and free of fluid. No mastoid effusion. Inner ear structures within normal limits.  Bone marrow signal intensity is normal. No scalp soft tissue abnormality.  MRV HEAD FINDINGS: Dedicated MRV images demonstrate no evidence for venous sinus thrombosis. The superior sagittal, transverse, and sigmoid sinuses are patent. Proximal internal jugular veins patent. No cortical vein thrombosis. Left transverse sinus is hypoplastic. Deep cerebral veins grossly patent.  IMPRESSION: Normal MRI/ MRV of the brain.  No acute finding.   Electronically Signed   By: BJeannine BogaM.D.   On: 11/04/2014 23:29   Mr Mrv HMiguel DibbleWo Cm  11/04/2014   CLINICAL DATA:  Initial evaluation for headache. History of  lupus, hypertension.  EXAM: MRI HEAD WITHOUT AND WITH CONTRAST  MRV HEAD WITHOUT AND WITH CONTRAST  TECHNIQUE: Multiplanar, multiecho pulse sequences of the brain and surrounding structures were obtained without and with intravenous contrast. Angiographic images of the intracranial venous structures were obtained using MRV technique without intravenous contrast.  CONTRAST:  244mMULTIHANCE GADOBENATE DIMEGLUMINE 529 MG/ML IV SOLN  COMPARISON:  Prior CT from earlier the same day.  FINDINGS: MRI HEAD FINDINGS:  Cerebral volume within normal limits for patient age. A few tiny scattered subcentimeter T2/FLAIR hyperintense foci noted within the periventricular and deep white matter of the right frontal lobe, nonspecific, but felt to be within normal limits for patient age.  No evidence for acute intracranial infarct. Gray-white matter differentiation is maintained. Normal intravascular flow voids are preserved. No acute or chronic intracranial hemorrhage.  No mass lesion, mass effect, or midline shift. No hydrocephalus. No extra-axial fluid collection. No abnormal enhancement.  Craniocervical junction within normal limits. Visualized upper cervical spine unremarkable. Pituitary gland within normal limits. No acute abnormality about the orbits.  Paranasal sinuses are well pneumatized and free of fluid. No mastoid effusion. Inner ear structures within normal limits.  Bone marrow signal intensity is normal. No scalp soft tissue abnormality.  MRV HEAD FINDINGS: Dedicated MRV images demonstrate no evidence for venous sinus thrombosis. The superior sagittal, transverse, and sigmoid sinuses are patent. Proximal internal jugular veins patent. No cortical vein thrombosis. Left transverse sinus is hypoplastic. Deep cerebral veins grossly patent.  IMPRESSION: Normal MRI/ MRV of the brain.  No acute finding.   Electronically Signed   By: BeJeannine Boga.D.   On: 11/04/2014 23:29     EKG: Independently reviewed. Normal  sinus Rhythm Rate = 93   Assessment/Plan:     [age].o. female with  Principal Problem:   1.    Persistent headaches   LP ordered to be done by Fluoroscopy or by Interventional Radiology  Active Problems:  2.    Facial droop- rule out Bells Palsy   LP today     3.    Syncope- Fainting, versus Hypoglycemic event   Neuro checks   Orthostatic vitals   Telemetry Monitoring     4.    Hypokalemia   Replace K+   Check Magnesium Level Replace PRN     5.    DM (diabetes mellitus)   SSI coverage PRN     6.    Hypertension   Monitor BPs     7.    Hyperlipemia   Not on meds currently       8.    Psoriatic arthritis   No Longer on Humira Rx due to development of Lupus    On Prednisone Rx    9.    DVT Prophylaxis   SCDs      Code Status:     FULL CODE       Family Communication:   Family at Bedside   Disposition Plan:    Inpatient Status        Time spent:  [facility] Pager [phone]   If [place] Please Contact the Day Rounding Team MD for [facility]  If 7PM-7AM, Please Contact Night-Floor Coverage  [email or web address] Password [email or web address] 11/05/2014, 7:17 AM     ADDENDUM:   Patient was seen and examined on 11/05/2014

## 2014-11-05 NOTE — ED Notes (Signed)
Spoke with Chole, RN on 4W who will confirm with unit charge nurse to determine if room assignment needs to be changed due to new order rec'd for Droplet precautions.

## 2014-11-05 NOTE — ED Notes (Signed)
Report called to [place] on 5E.

## 2014-11-05 NOTE — Progress Notes (Signed)
Subjective: Continues to have headache  Exam: Filed Vitals:   11/05/14 0529  BP: 119/49  Pulse: 73  Temp: 97.6 F (36.4 C)  Resp: 18   Gen: In bed, NAD Resp: non-labored breathing, no acute distress Abd: soft, nt  Has fever blister  Neuro: MS: Awake, alert TY:YPEJ Motor: MAEW  Pertinent Labs: OP 10 cm H2O  Impression: 44 yo with headache. LP results pending. One other possibility is that this is a side effect of belviq as this commonly causes headache. If her fever blisters are HSV2, this commonly causes a meningitis and therefore I have added HSV PCR to her CSF  Recommendations: 1) Await LP studies.  2) Agree with holding belviq 3) will try compazine and magnesium for headache.   Roland Rack, MD [facility] [phone]  If 7pm- 7am, please page neurology on call as listed in [place].

## 2014-11-05 NOTE — Procedures (Signed)
Successful FLUORO LP NO COMP STABLE Full report in PACS

## 2014-11-06 LAB — FUNGAL STAIN
Fungal Smear: NONE SEEN
Special Requests: NORMAL

## 2014-11-06 MED ORDER — NYSTATIN 100000 UNIT/GM EX POWD: CUTANEOUS | Status: DC

## 2014-11-06 MED ORDER — TRAMADOL HCL 50 MG PO TABS: 50.0000 mg | ORAL_TABLET | Freq: Four times a day (QID) | ORAL | Status: DC | PRN

## 2014-11-06 NOTE — Discharge Summary (Signed)
Physician Discharge Summary  Melinda Porter XBD:[id number] DOB: 21-Nov-1970 DOA: 11/04/2014  PCP: Imagene Riches, NP  Admit date: 11/04/2014 Discharge date: 11/06/2014  Time spent: > 35  minutes  Recommendations for Outpatient Follow-up:  1.  Please follow up with HSV PCR of CSF 2. Neurology recommended no further work up in house if symptoms persist please refer to Neurologist for further outpatient evaluation  Discharge Diagnoses:  Principal Problem:   Persistent headaches Active Problems:   Facial droop   DM (diabetes mellitus)   Hypokalemia   Syncope   Hypertension   Hyperlipemia   Psoriatic arthritis   Discharge Condition: stable  Diet recommendation: Carb modified diet  Filed Weights   11/05/14 0255  Weight: 105.053 kg (231 lb 9.6 oz)    History of present illness:  44 y.o. 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 Pt also complained of some right sided weakness and blurred vision  Hospital Course:  Headache - Neurology consulted and Recommending no further neurological work up. CSF reported no growth on CSF culture.  - MRI of head reported normal with no acute finding - CT Scan of head reported normal head CT - Neurologist currently suspect viral etiology which may have caused symptoms and recommends analgesics for HA. - Holding Belviq on d/c  Tinea Corpis - provide script for topical antifungal  DM - Carb modified diet. - no hypoglycemic agents on MAR  Procedures:  Please refer to EMR for details  LP  Consultations:  Neurology  Discharge Exam: Filed Vitals:   11/06/14 0154  BP: 134/81  Pulse: 101  Temp: 98.9 F (37.2 C)  Resp: 18    General: Pt in nad, alert and awake Cardiovascular: no cyanosis, no edema Respiratory: no increased wob, no wheezes, equal chest rise.  Discharge  Instructions   Discharge Instructions    Diet - low sodium heart healthy    Complete by:  As directed      Discharge instructions    Complete by:  As directed   Follow up with your pcp in 1-2 weeks or sooner should any new concerns arise.     Increase activity slowly    Complete by:  As directed           Current Discharge Medication List    START taking these medications   Details  nystatin (MYCOSTATIN/NYSTOP) 100000 UNIT/GM POWD For use as recommended per package insert to affected areas (under breast) Qty: 15 g, Refills: 0    traMADol (ULTRAM) 50 MG tablet Take 1 tablet (50 mg total) by mouth every 6 (six) hours as needed for moderate pain. Qty: 20 tablet, Refills: 0      CONTINUE these medications which have NOT CHANGED   Details  celecoxib (CELEBREX) 200 MG capsule Take 200 mg by mouth daily. Refills: 1    clonazePAM (KLONOPIN) 1 MG tablet Take 1 mg by mouth 4 (four) times daily as needed. For anxiety    DULoxetine (CYMBALTA) 30 MG capsule Take 1 capsule by mouth daily.    esomeprazole (NEXIUM) 40 MG capsule Take 40 mg by mouth daily at 12 noon.    gabapentin (NEURONTIN) 100 MG capsule Take 100 mg by mouth 3 (three) times daily.    hydrocortisone cream 1 % Apply 1 application topically 2 (two) times daily as needed. For itching    triamterene-hydrochlorothiazide (MAXZIDE-25) 37.5-25  MG per tablet Take 1 tablet by mouth daily.      STOP taking these medications     BELVIQ 10 MG TABS      sertraline (ZOLOFT) 100 MG tablet      ciprofloxacin (CIPRO) 500 MG tablet      predniSONE (DELTASONE) 5 MG tablet        Allergies  Allergen Reactions  . Nsaids Other (See Comments)    Liver problems- can't take tylenol, ibuprofen etc.  . Penicillins   . Codeine Rash  . Morphine And Related Rash  . Sulfa Antibiotics Rash      The results of significant diagnostics from this hospitalization (including imaging, microbiology, ancillary and laboratory) are listed  below for reference.    Significant Diagnostic Studies: Ct Head Wo Contrast  11/04/2014   CLINICAL DATA:  Headache, blurry vision  EXAM: CT HEAD WITHOUT CONTRAST  TECHNIQUE: Contiguous axial images were obtained from the base of the skull through the vertex without intravenous contrast.  COMPARISON:  [place] head CT dated 11/04/2014 at 1429 hours.  FINDINGS: No evidence of parenchymal hemorrhage or extra-axial fluid collection. No mass lesion, mass effect, or midline shift.  No CT evidence of acute infarction.  Cerebral volume is within normal limits.  No ventriculomegaly.  The visualized paranasal sinuses are essentially clear. The mastoid air cells are unopacified.  No evidence of calvarial fracture.  IMPRESSION: Normal head CT.  No interval change from head CT earlier today.   Electronically Signed   By: Julian Hy [name]   On: 11/04/2014 16:47   Mr Jeri Cos OF Contrast  11/04/2014   CLINICAL DATA:  Initial evaluation for headache. History of lupus, hypertension.  EXAM: MRI HEAD WITHOUT AND WITH CONTRAST  MRV HEAD WITHOUT AND WITH CONTRAST  TECHNIQUE: Multiplanar, multiecho pulse sequences of the brain and surrounding structures were obtained without and with intravenous contrast. Angiographic images of the intracranial venous structures were obtained using MRV technique without intravenous contrast.  CONTRAST:  70m MULTIHANCE GADOBENATE DIMEGLUMINE 529 MG/ML IV SOLN  COMPARISON:  Prior CT from earlier the same day.  FINDINGS: MRI HEAD FINDINGS:  Cerebral volume within normal limits for patient age. A few tiny scattered subcentimeter T2/FLAIR hyperintense foci noted within the periventricular and deep white matter of the right frontal lobe, nonspecific, but felt to be within normal limits for patient age.  No evidence for acute intracranial infarct. Gray-white matter differentiation is maintained. Normal intravascular flow voids are preserved. No acute or chronic intracranial hemorrhage.  No mass  lesion, mass effect, or midline shift. No hydrocephalus. No extra-axial fluid collection. No abnormal enhancement.  Craniocervical junction within normal limits. Visualized upper cervical spine unremarkable. Pituitary gland within normal limits. No acute abnormality about the orbits.  Paranasal sinuses are well pneumatized and free of fluid. No mastoid effusion. Inner ear structures within normal limits.  Bone marrow signal intensity is normal. No scalp soft tissue abnormality.  MRV HEAD FINDINGS: Dedicated MRV images demonstrate no evidence for venous sinus thrombosis. The superior sagittal, transverse, and sigmoid sinuses are patent. Proximal internal jugular veins patent. No cortical vein thrombosis. Left transverse sinus is hypoplastic. Deep cerebral veins grossly patent.  IMPRESSION: Normal MRI/ MRV of the brain.  No acute finding.   Electronically Signed   By: BJeannine BogaM.D.   On: 11/04/2014 23:29   Mr Mrv HMiguel DibbleWo Cm  11/04/2014   CLINICAL DATA:  Initial evaluation for headache. History of lupus, hypertension.  EXAM: MRI HEAD  WITHOUT AND WITH CONTRAST  MRV HEAD WITHOUT AND WITH CONTRAST  TECHNIQUE: Multiplanar, multiecho pulse sequences of the brain and surrounding structures were obtained without and with intravenous contrast. Angiographic images of the intracranial venous structures were obtained using MRV technique without intravenous contrast.  CONTRAST:  1m MULTIHANCE GADOBENATE DIMEGLUMINE 529 MG/ML IV SOLN  COMPARISON:  Prior CT from earlier the same day.  FINDINGS: MRI HEAD FINDINGS:  Cerebral volume within normal limits for patient age. A few tiny scattered subcentimeter T2/FLAIR hyperintense foci noted within the periventricular and deep white matter of the right frontal lobe, nonspecific, but felt to be within normal limits for patient age.  No evidence for acute intracranial infarct. Gray-white matter differentiation is maintained. Normal intravascular flow voids are preserved. No  acute or chronic intracranial hemorrhage.  No mass lesion, mass effect, or midline shift. No hydrocephalus. No extra-axial fluid collection. No abnormal enhancement.  Craniocervical junction within normal limits. Visualized upper cervical spine unremarkable. Pituitary gland within normal limits. No acute abnormality about the orbits.  Paranasal sinuses are well pneumatized and free of fluid. No mastoid effusion. Inner ear structures within normal limits.  Bone marrow signal intensity is normal. No scalp soft tissue abnormality.  MRV HEAD FINDINGS: Dedicated MRV images demonstrate no evidence for venous sinus thrombosis. The superior sagittal, transverse, and sigmoid sinuses are patent. Proximal internal jugular veins patent. No cortical vein thrombosis. Left transverse sinus is hypoplastic. Deep cerebral veins grossly patent.  IMPRESSION: Normal MRI/ MRV of the brain.  No acute finding.   Electronically Signed   By: BJeannine BogaM.D.   On: 11/04/2014 23:29   Dg Fluoro Guide Lumbar Puncture  11/05/2014   CLINICAL DATA:  Headaches, altered mental status  EXAM: DIAGNOSTIC LUMBAR PUNCTURE UNDER FLUOROSCOPIC GUIDANCE  FLUOROSCOPY TIME:  If the device does not provide the exposure index:  Fluoroscopy Time (in minutes and seconds):  12 seconds  Number of Acquired Images:  1.  PROCEDURE: Informed consent was obtained from the patient prior to the procedure, including potential complications of headache, allergy, and pain. With the patient prone, the lower back was prepped with Betadine. 1% Lidocaine was used for local anesthesia. Lumbar puncture was performed at the L5-S1 level using a 21 gauge needle with return of clear CSF with an opening pressure of 10 cm water. 10 Ml of CSF were obtained for laboratory studies. The patient tolerated the procedure well and there were no apparent complications.  IMPRESSION: Successful fluoroscopic L5-S1 lumbar puncture. Normal pressure. Clear CSF.   Electronically Signed   By:  MJerilynn Mages  Shick [name]   On: 11/05/2014 10:48    Microbiology: Recent Results (from the past 240 hour(s))  CSF culture     Status: None (Preliminary result)   Collection Time: 11/05/14 10:30 AM  Result Value Ref Range Status   Specimen Description CSF  Final   Special Requests NONE  Final   Gram Stain   Final    NO ORGANISMS SEEN NO WBC SEEN CYTOSPIN Gram Stain Report Called to,Read Back By and Verified With: T BLAINE AT 1144 ON 07.23.2016 BY NBROOKS    Culture   Final    NO GROWTH < 24 HOURS [facility]   Report Status PENDING  Incomplete     Labs: Basic Metabolic Panel:  Recent Labs Lab 11/04/14 1632 11/05/14 0510  NA 138 142  K 3.4* 3.3*  CL 100* 104  CO2 25 29  GLUCOSE 131* 96  BUN 19 21*  CREATININE 0.82 0.77  CALCIUM 9.5 8.9  MG  --  2.2   Liver Function Tests:  Recent Labs Lab 11/04/14 1632  AST 45*  ALT 53  ALKPHOS 71  BILITOT 0.6  PROT 7.4  ALBUMIN 4.3   No results for input(s): LIPASE, AMYLASE in the last 168 hours. No results for input(s): AMMONIA in the last 168 hours. CBC:  Recent Labs Lab 11/04/14 1632 11/05/14 0510  WBC 10.8* 9.6  HGB 14.1 12.8  HCT 44.2 41.5  MCV 78.1 79.5  PLT 137* 158   Cardiac Enzymes: No results for input(s): CKTOTAL, CKMB, CKMBINDEX, TROPONINI in the last 168 hours. BNP: BNP (last 3 results) No results for input(s): BNP in the last 8760 hours.  ProBNP (last 3 results) No results for input(s): PROBNP in the last 8760 hours.  CBG:  Recent Labs Lab 11/04/14 1628  GLUCAP 134*    Signed:  Velvet Bathe  [facility] 11/06/2014, 12:27 PM

## 2014-11-08 LAB — HERPES SIMPLEX VIRUS(HSV) DNA BY PCR
HSV 1 DNA: NEGATIVE
HSV 2 DNA: NEGATIVE

## 2014-11-08 LAB — CSF CULTURE W GRAM STAIN: Gram Stain: NONE SEEN

## 2014-11-08 LAB — CSF CULTURE: CULTURE: NO GROWTH

## 2014-12-01 ENCOUNTER — Encounter: Payer: Self-pay | Admitting: Vascular Surgery

## 2014-12-02 ENCOUNTER — Encounter: Payer: 59 | Admitting: Vascular Surgery

## 2014-12-02 ENCOUNTER — Encounter (HOSPITAL_COMMUNITY): Payer: 59

## 2014-12-02 LAB — CULTURE, FUNGUS WITHOUT SMEAR

## 2015-02-23 ENCOUNTER — Ambulatory Visit: Payer: 59 | Admitting: Physician Assistant

## 2015-07-27 ENCOUNTER — Other Ambulatory Visit: Payer: Self-pay

## 2015-07-27 DIAGNOSIS — Z1231 Encounter for screening mammogram for malignant neoplasm of breast: Secondary | ICD-10-CM

## 2015-08-04 ENCOUNTER — Other Ambulatory Visit (HOSPITAL_COMMUNITY): Payer: Self-pay | Admitting: Specialist

## 2015-08-04 ENCOUNTER — Ambulatory Visit (HOSPITAL_COMMUNITY)
Admission: RE | Admit: 2015-08-04 | Discharge: 2015-08-04 | Disposition: A | Discharge status: Home / Self Care | Payer: 59 | Source: Ambulatory Visit | Attending: Specialist | Admitting: Specialist

## 2015-08-04 DIAGNOSIS — R1084 Generalized abdominal pain: Secondary | ICD-10-CM | POA: Diagnosis present

## 2015-08-04 DIAGNOSIS — R938 Abnormal findings on diagnostic imaging of other specified body structures: Secondary | ICD-10-CM | POA: Diagnosis not present

## 2015-08-04 DIAGNOSIS — R195 Other fecal abnormalities: Secondary | ICD-10-CM | POA: Insufficient documentation

## 2015-08-11 ENCOUNTER — Emergency Department (HOSPITAL_COMMUNITY): Payer: 59

## 2015-08-11 ENCOUNTER — Emergency Department (HOSPITAL_COMMUNITY)
Admission: EM | Admit: 2015-08-11 | Discharge: 2015-08-11 | Disposition: A | Discharge status: Home / Self Care | Payer: 59 | Attending: Emergency Medicine | Admitting: Emergency Medicine

## 2015-08-11 ENCOUNTER — Encounter (HOSPITAL_COMMUNITY): Payer: Self-pay

## 2015-08-11 DIAGNOSIS — I10 Essential (primary) hypertension: Secondary | ICD-10-CM | POA: Insufficient documentation

## 2015-08-11 DIAGNOSIS — Z880 Allergy status to penicillin: Secondary | ICD-10-CM | POA: Insufficient documentation

## 2015-08-11 DIAGNOSIS — F4310 Post-traumatic stress disorder, unspecified: Secondary | ICD-10-CM | POA: Insufficient documentation

## 2015-08-11 DIAGNOSIS — E119 Type 2 diabetes mellitus without complications: Secondary | ICD-10-CM | POA: Insufficient documentation

## 2015-08-11 DIAGNOSIS — Z792 Long term (current) use of antibiotics: Secondary | ICD-10-CM | POA: Diagnosis not present

## 2015-08-11 DIAGNOSIS — Z9889 Other specified postprocedural states: Secondary | ICD-10-CM | POA: Diagnosis not present

## 2015-08-11 DIAGNOSIS — F419 Anxiety disorder, unspecified: Secondary | ICD-10-CM | POA: Diagnosis not present

## 2015-08-11 DIAGNOSIS — R1012 Left upper quadrant pain: Secondary | ICD-10-CM | POA: Diagnosis not present

## 2015-08-11 DIAGNOSIS — G8929 Other chronic pain: Secondary | ICD-10-CM | POA: Diagnosis not present

## 2015-08-11 DIAGNOSIS — Z90710 Acquired absence of both cervix and uterus: Secondary | ICD-10-CM | POA: Insufficient documentation

## 2015-08-11 DIAGNOSIS — M1990 Unspecified osteoarthritis, unspecified site: Secondary | ICD-10-CM | POA: Insufficient documentation

## 2015-08-11 DIAGNOSIS — F329 Major depressive disorder, single episode, unspecified: Secondary | ICD-10-CM | POA: Diagnosis not present

## 2015-08-11 DIAGNOSIS — Z79899 Other long term (current) drug therapy: Secondary | ICD-10-CM | POA: Diagnosis not present

## 2015-08-11 DIAGNOSIS — Z8742 Personal history of other diseases of the female genital tract: Secondary | ICD-10-CM | POA: Diagnosis not present

## 2015-08-11 DIAGNOSIS — Z87891 Personal history of nicotine dependence: Secondary | ICD-10-CM | POA: Insufficient documentation

## 2015-08-11 DIAGNOSIS — Z8719 Personal history of other diseases of the digestive system: Secondary | ICD-10-CM | POA: Insufficient documentation

## 2015-08-11 DIAGNOSIS — R110 Nausea: Secondary | ICD-10-CM | POA: Insufficient documentation

## 2015-08-11 LAB — LIPASE, BLOOD: Lipase: 50 U/L (ref 11–51)

## 2015-08-11 LAB — URINALYSIS, ROUTINE W REFLEX MICROSCOPIC
BILIRUBIN URINE: NEGATIVE
Glucose, UA: NEGATIVE mg/dL
Hgb urine dipstick: NEGATIVE
Ketones, ur: NEGATIVE mg/dL
LEUKOCYTES UA: NEGATIVE
NITRITE: NEGATIVE
PH: 5 (ref 5.0–8.0)
Protein, ur: NEGATIVE mg/dL
SPECIFIC GRAVITY, URINE: 1.022 (ref 1.005–1.030)

## 2015-08-11 LAB — COMPREHENSIVE METABOLIC PANEL
ALBUMIN: 4.1 g/dL (ref 3.5–5.0)
ALT: 29 U/L (ref 14–54)
ANION GAP: 13 (ref 5–15)
AST: 28 U/L (ref 15–41)
Alkaline Phosphatase: 70 U/L (ref 38–126)
BILIRUBIN TOTAL: 0.6 mg/dL (ref 0.3–1.2)
BUN: 17 mg/dL (ref 6–20)
CO2: 21 mmol/L — ABNORMAL LOW (ref 22–32)
Calcium: 9.3 mg/dL (ref 8.9–10.3)
Chloride: 106 mmol/L (ref 101–111)
Creatinine, Ser: 1.01 mg/dL — ABNORMAL HIGH (ref 0.44–1.00)
GFR calc non Af Amer: >60 mL/min (ref >60)
GLUCOSE: 102 mg/dL — AB (ref 65–99)
POTASSIUM: 3.1 mmol/L — AB (ref 3.5–5.1)
SODIUM: 140 mmol/L (ref 135–145)
TOTAL PROTEIN: 7 g/dL (ref 6.5–8.1)

## 2015-08-11 LAB — CBC
HEMATOCRIT: 39.2 % (ref 36.0–46.0)
HEMOGLOBIN: 12.4 g/dL (ref 12.0–15.0)
MCH: 24.9 pg — ABNORMAL LOW (ref 26.0–34.0)
MCHC: 31.6 g/dL (ref 30.0–36.0)
MCV: 78.7 fL (ref 78.0–100.0)
Platelets: 179 10*3/uL (ref 150–400)
RBC: 4.98 MIL/uL (ref 3.87–5.11)
RDW: 15.6 % — ABNORMAL HIGH (ref 11.5–15.5)
WBC: 8.8 10*3/uL (ref 4.0–10.5)

## 2015-08-11 MED ORDER — HYDROMORPHONE HCL 1 MG/ML IJ SOLN
1.0000 mg | Freq: Once | INTRAMUSCULAR | Status: AC
  Administered 2015-08-11: 1 mg via INTRAVENOUS
  Filled 2015-08-11: qty 1

## 2015-08-11 MED ORDER — ONDANSETRON HCL 4 MG/2ML IJ SOLN
4.0000 mg | Freq: Once | INTRAMUSCULAR | Status: AC
  Administered 2015-08-11: 4 mg via INTRAVENOUS
  Filled 2015-08-11: qty 2

## 2015-08-11 MED ORDER — SODIUM CHLORIDE 0.9 % IV BOLUS (SEPSIS)
1000.0000 mL | Freq: Once | INTRAVENOUS | Status: AC
  Administered 2015-08-11: 1000 mL via INTRAVENOUS

## 2015-08-11 MED ORDER — IOPAMIDOL (ISOVUE-300) INJECTION 61%
INTRAVENOUS | Status: AC
  Administered 2015-08-11: 100 mL
  Filled 2015-08-11: qty 100

## 2015-08-11 NOTE — ED Notes (Signed)
Pt had abdominal xray on 4/21 and sent here by PCP to have CT scan done.

## 2015-08-11 NOTE — Discharge Instructions (Signed)
You have been seen today for abdominal pain with suspected bowel obstruction. Your imaging and lab tests showed no acute abnormalities. Follow up with PCP as needed should symptoms continue. Return to ED should symptoms worsen.

## 2015-08-11 NOTE — ED Provider Notes (Signed)
CSN: [id number]     Arrival date & time 08/11/15  1554 History   First MD Initiated Contact with Patient 08/11/15 1706     Chief Complaint  Patient presents with  . Bowel Obstruction      (Consider location/radiation/quality/duration/timing/severity/associated sxs/prior Treatment) HPI   Melinda Porter is a 45 y.o. female, with a history of hypertension, anxiety, fibromyalgia, Sjogren's, lupus, and DM, presenting to the ED with acute on chronic left upper quadrant abdominal pain that increased over the last week. Patient was evaluated by her PCP on April 21, obtained an abdominal x-ray, and received the results today. Patient's PCP called her and told her to proceed to the ED because she had what looked to be a bowel obstruction. Patient rates the pain at 7 out of 10, gives a very vague description, nonradiating. Patient states, "I tend to have pain all the time everywhere. I have 6 autoimmune diseases." Patient states that her most recent bowel movement was today and there were no abnormalities with it. Patient is still readily passing gas. Patient denies fever/chills, N/V/C/D, abnormal vaginal discharge or bleeding, urinary issues, or any other complaints.    Past Medical History  Diagnosis Date  . PTSD (post-traumatic stress disorder)   . Hypertension   . Depression   . Hyperlipemia   . Endometriosis   . Anxiety   . DM (diabetes mellitus) ([place])   . Arthritis   . Hiatal hernia   . Lupus [facility])    Past Surgical History  Procedure Laterality Date  . Partial hysterectomy  07/2009  . Tonsillectomy  2009  . Cesarean section  1997  . Temporomandibular joint surgery  2009    right side  . Oophorectomy Bilateral    Family History  Problem Relation Age of Onset  . Diabetes Maternal Grandfather   . Heart disease Maternal Grandfather   . Hypertension Maternal Grandfather   . Colon cancer Maternal Uncle     great uncle  . Colon polyps Maternal Grandmother   . Lung cancer Maternal  Grandmother   . Liver disease Father     agent orange   Social History  Substance Use Topics  . Smoking status: Former Smoker    Types: Cigarettes    Quit date: 03/09/1999  . Smokeless tobacco: Never Used  . Alcohol Use: No   OB History    Gravida Para Term Preterm AB TAB SAB Ectopic Multiple Living   2 1   1 1    1      Review of Systems  Constitutional: Negative for fever, chills and diaphoresis.  Gastrointestinal: Positive for nausea and abdominal pain. Negative for vomiting, diarrhea, constipation and blood in stool.  Genitourinary: Positive for flank pain (left). Negative for dysuria and hematuria.  All other systems reviewed and are negative.     Allergies  Nsaids; Penicillins; Codeine; Morphine and related; and Sulfa antibiotics  Home Medications   Prior to Admission medications   Medication Sig Start Date End Date Taking? Authorizing Provider  amoxicillin-clavulanate (AUGMENTIN) 875-125 MG tablet Take 1 tablet by mouth 2 (two) times daily.   Yes Historical Provider, MD  Apremilast (OTEZLA) 30 MG TABS Take 1 tablet by mouth daily.   Yes Historical Provider, MD  clonazePAM (KLONOPIN) 1 MG tablet Take 1 mg by mouth 4 (four) times daily as needed. For anxiety   Yes Historical Provider, MD  esomeprazole (NEXIUM) 40 MG capsule Take 40 mg by mouth daily at 12 noon.   Yes Historical Provider,  MD  hydrocortisone cream 1 % Apply 1 application topically 2 (two) times daily as needed. For itching   Yes Historical Provider, MD  hydroxychloroquine (PLAQUENIL) 200 MG tablet Take 200 mg by mouth 2 (two) times daily.   Yes Historical Provider, MD  sertraline (ZOLOFT) 100 MG tablet Take 100 mg by mouth daily.   Yes Historical Provider, MD  topiramate (TOPAMAX) 50 MG tablet Take 50 mg by mouth 2 (two) times daily.   Yes Historical Provider, MD  triamterene-hydrochlorothiazide (MAXZIDE-25) 37.5-25 MG per tablet Take 1 tablet by mouth daily.   Yes Historical Provider, MD  Vitamin D,  Ergocalciferol, (DRISDOL) 50000 units CAPS capsule Take 50,000 Units by mouth every 7 (seven) days. Take on Mondays   Yes Historical Provider, MD  nystatin (MYCOSTATIN/NYSTOP) 100000 UNIT/GM POWD For use as recommended per package insert to affected areas (under breast) Patient not taking: Reported on 08/11/2015 11/06/14   Velvet Bathe, MD  traMADol (ULTRAM) 50 MG tablet Take 1 tablet (50 mg total) by mouth every 6 (six) hours as needed for moderate pain. Patient not taking: Reported on 08/11/2015 11/06/14   Velvet Bathe, MD   BP 101/57 mmHg  Pulse 75  Temp(Src) 98.6 F (37 C) (Oral)  Resp 22  Ht 5' 4"  (1.626 m)  Wt 102.513 kg  BMI 38.77 kg/m2  SpO2 90%  LMP 06/23/2009 Physical Exam  Constitutional: She appears well-developed and well-nourished. No distress.  HENT:  Head: Normocephalic and atraumatic.  Eyes: Conjunctivae are normal. Pupils are equal, round, and reactive to light.  Neck: Neck supple.  Cardiovascular: Normal rate, regular rhythm, normal heart sounds and intact distal pulses.   Pulmonary/Chest: Effort normal and breath sounds normal. No respiratory distress.  Abdominal: Soft. Normal appearance and bowel sounds are normal. There is tenderness in the left upper quadrant. There is no guarding and no CVA tenderness.  Musculoskeletal: She exhibits no edema or tenderness.  Lymphadenopathy:    She has no cervical adenopathy.  Neurological: She is alert.  Skin: Skin is warm and dry. She is not diaphoretic.  Psychiatric: She has a normal mood and affect. Her behavior is normal.  Nursing note and vitals reviewed.   ED Course  Procedures (including critical care time) Labs Review Labs Reviewed  COMPREHENSIVE METABOLIC PANEL - Abnormal; Notable for the following:    Potassium 3.1 (*)    CO2 21 (*)    Glucose, Bld 102 (*)    Creatinine, Ser 1.01 (*)    All other components within normal limits  CBC - Abnormal; Notable for the following:    MCH 24.9 (*)    RDW 15.6 (*)     All other components within normal limits  LIPASE, BLOOD  URINALYSIS, ROUTINE W REFLEX MICROSCOPIC (NOT AT [facility])    Imaging Review Ct Abdomen Pelvis W Contrast  08/11/2015  CLINICAL DATA:  Suspect bowel obstruction, left-sided abdominal pain for 1-1/2 weeks, history of C-section and partial hysterectomy EXAM: CT ABDOMEN AND PELVIS WITH CONTRAST TECHNIQUE: Multidetector CT imaging of the abdomen and pelvis was performed using the standard protocol following bolus administration of intravenous contrast. CONTRAST:  100 mL Isovue-300 COMPARISON:  April 22, 2010 CT, 08/04/2015 abdominal radiographs FINDINGS: Lower chest:  Negative Hepatobiliary: Significant diffuse hepatic steatosis. Gallbladder normal. Pancreas: Pancreas is normal. Spleen: Spleen is normal. Adrenals/Urinary Tract: Adrenal glands are normal. Kidneys are normal. No hydronephrosis. Bladder is normal. Stomach/Bowel: Stomach is normal. Small bowel is normal. The previously observed dilated loop of bowel on 08/04/2015 radiograph is  no longer present. Large bowel is normal. There is a nonobstructive bowel gas pattern. Vascular/Lymphatic: Mild atherosclerotic aortic calcification. No significant adenopathy. Reproductive: The uterus appears to be surgically absent. There are no pelvic masses. Other: There is no free fluid in the abdomen or pelvis. Musculoskeletal: There are no acute musculoskeletal findings. IMPRESSION: Significant hepatic steatosis. Nonobstructive bowel gas pattern. No acute findings. Electronically Signed   By: Skipper Cliche [name]   On: 08/11/2015 18:35   I have personally reviewed and evaluated these images and lab results as part of my medical decision-making.  Dg Abd Acute W/chest  08/04/2015  CLINICAL DATA:  Left lateral abdominal pain with nausea for 3 days. Hematuria. EXAM: DG ABDOMEN ACUTE W/ 1V CHEST COMPARISON:  04/11/2015 chest radiograph. 07/01/2010 abdominal radiographs. FINDINGS: Stable cardiomediastinal silhouette  with normal heart size. No pneumothorax. No pleural effusion. Lungs appear clear, with no acute consolidative airspace disease and no pulmonary edema. Mildly dilated small bowel loop in the central abdomen measuring 3.5 cm diameter. No appreciable air-fluid levels. Mild stool throughout the colon. No evidence of pneumatosis or pneumoperitoneum. No pathologic soft tissue calcifications. Mild degenerative changes in the visualized thoracolumbar spine. IMPRESSION: 1. No active disease in the chest. 2. Mildly dilated small bowel loop in the central abdomen without air-fluid levels. Mild stool throughout the colon. Bowel gas pattern is nonspecific, possibly a mild adynamic ileus, cannot entirely exclude a partial distal small bowel obstruction. Correlate with CT abdomen/pelvis with IV and oral contrast as clinically warranted. Electronically Signed   By: Ilona Sorrel [name]   On: 08/04/2015 14:10     EKG Interpretation None      MDM   Final diagnoses:  Left upper quadrant pain    Maylee S Robinette presents with left upper quadrant abdominal pain and nausea recurrent over the last week.  Findings and plan of care discussed with Elnora Morrison, MD. Dr. Reather Converse personally evaluated and examined this patient.  This patient was sent to the ED by her PCP for a CT scan due to the x-ray findings of dilated small bowel loops. The patient's bowel history does not support a complete obstruction, however, it does not prove that a partial obstruction does not exist. CT scan is clear from obstruction or any other acute abnormalities. Patient to follow up with PCP should symptoms continue. Return precautions discussed. Patient voices understanding of these instructions, accepts the plan, and is comfortable with discharge.   Filed Vitals:   08/11/15 1600 08/11/15 1930  BP: 118/87 101/57  Pulse: 94 75  Temp: 98.6 F (37 C)   TempSrc: Oral   Resp: 22   Height: 5' 4"  (1.626 m)   Weight: 102.513 kg   SpO2: 99% 90%      Lorayne Bender, PA-C 08/11/15 1940  Elnora Morrison, MD 08/12/15 0201

## 2015-08-11 NOTE — ED Notes (Signed)
Pt verbalized understanding of discharge instructions and follow-up care. Vital signs stable at discharge.

## 2015-08-11 NOTE — ED Notes (Signed)
Pt reports her PCP told her today she has an obstruction in her colon. She reports LLQ abdominal pain, onset last week. Denies V/D but reports nausea but states she always feels nauseous.

## 2015-08-18 ENCOUNTER — Ambulatory Visit
Admission: RE | Admit: 2015-08-18 | Discharge: 2015-08-18 | Disposition: A | Discharge status: Home / Self Care | Payer: 59 | Source: Ambulatory Visit

## 2015-08-18 DIAGNOSIS — Z1231 Encounter for screening mammogram for malignant neoplasm of breast: Secondary | ICD-10-CM

## 2015-11-27 ENCOUNTER — Encounter (HOSPITAL_COMMUNITY): Payer: Self-pay | Admitting: Emergency Medicine

## 2015-11-27 ENCOUNTER — Emergency Department (HOSPITAL_COMMUNITY)
Admission: EM | Admit: 2015-11-27 | Discharge: 2015-11-27 | Disposition: A | Discharge status: Home / Self Care | Payer: 59 | Attending: Emergency Medicine | Admitting: Emergency Medicine

## 2015-11-27 ENCOUNTER — Emergency Department (HOSPITAL_COMMUNITY): Payer: 59

## 2015-11-27 DIAGNOSIS — Z87891 Personal history of nicotine dependence: Secondary | ICD-10-CM | POA: Diagnosis not present

## 2015-11-27 DIAGNOSIS — I10 Essential (primary) hypertension: Secondary | ICD-10-CM | POA: Insufficient documentation

## 2015-11-27 DIAGNOSIS — E119 Type 2 diabetes mellitus without complications: Secondary | ICD-10-CM | POA: Insufficient documentation

## 2015-11-27 DIAGNOSIS — J069 Acute upper respiratory infection, unspecified: Secondary | ICD-10-CM | POA: Insufficient documentation

## 2015-11-27 DIAGNOSIS — R05 Cough: Secondary | ICD-10-CM | POA: Diagnosis present

## 2015-11-27 MED ORDER — ALBUTEROL SULFATE HFA 108 (90 BASE) MCG/ACT IN AERS: 2.0000 | INHALATION_SPRAY | RESPIRATORY_TRACT | 0 refills | Status: DC | PRN

## 2015-11-27 MED ORDER — BENZONATATE 100 MG PO CAPS: 100.0000 mg | ORAL_CAPSULE | Freq: Three times a day (TID) | ORAL | 0 refills | Status: DC | PRN

## 2015-11-27 NOTE — ED Notes (Signed)
Patient called in waiting room and sub waiting room x 2 with no answer.

## 2015-11-27 NOTE — ED Provider Notes (Signed)
[facility] DEPT Provider Note   CSN: [id number] Arrival date & time: 11/27/15  1517  By signing my name below, I, Royce Macadamia, attest that this documentation has been prepared under the direction and in the presence of  Gloriann Loan, PA-C. Electronically Signed: Royce Macadamia, ED Scribe. 11/27/15. 4:52 PM.    History   Chief Complaint Chief Complaint  Patient presents with  . Cough   The history is provided by the patient. No language interpreter was used.     HPI Comments:  Melinda Porter is a 45 y.o. female who presents to the Emergency Department complaining of gradually worsening chest congestion beginning 5 days ago.  Pt reports associated cough productive occasionally of pink sputum, wheezing, chills, and subjective fever.  She also notes sore throat, SOB when coughing, and chest pain described as burning due to coughing.  Pt was prescribed keflex, but states her cough got worse after taking it.  She is also taking alkaseltzer plus.  Pt currently has a UTI.  Pt denies chest pain and SOB independent of coughing episode.  No hx of DVT/PE.  Past Medical History:  Diagnosis Date  . Anxiety   . Arthritis   . Depression   . DM (diabetes mellitus) ([place])   . Endometriosis   . Hiatal hernia   . Hyperlipemia   . Hypertension   . Lupus ([place])   . PTSD (post-traumatic stress disorder)     Patient Active Problem List   Diagnosis Date Noted  . Headache 11/05/2014  . Persistent headaches 11/05/2014  . Facial droop 11/05/2014  . DM (diabetes mellitus) ([place]) 11/05/2014  . Hypokalemia 11/05/2014  . Syncope 11/05/2014  . Hypertension 11/05/2014  . Hyperlipemia 11/05/2014  . Psoriatic arthritis ([place]) 11/05/2014  . Hypoglycemia 04/13/2014  . Abdominal pain, epigastric 03/08/2014  . Belching 03/08/2014  . Nausea without vomiting 03/08/2014    Past Surgical History:  Procedure Laterality Date  . CESAREAN SECTION  1997  . OOPHORECTOMY Bilateral   . PARTIAL  HYSTERECTOMY  07/2009  . TEMPOROMANDIBULAR JOINT SURGERY  2009   right side  . TONSILLECTOMY  2009    OB History    Gravida Para Term Preterm AB Living   2 1     1 1    SAB TAB Ectopic Multiple Live Births     1             Home Medications    Prior to Admission medications   Medication Sig Start Date End Date Taking? Authorizing Provider  albuterol (PROVENTIL HFA;VENTOLIN HFA) 108 (90 Base) MCG/ACT inhaler Inhale 2 puffs into the lungs every 4 (four) hours as needed for wheezing or shortness of breath. 11/27/15   Gloriann Loan, PA-C  amoxicillin-clavulanate (AUGMENTIN) 875-125 MG tablet Take 1 tablet by mouth 2 (two) times daily.    Historical Provider, MD  Apremilast (OTEZLA) 30 MG TABS Take 1 tablet by mouth daily.    Historical Provider, MD  benzonatate (TESSALON) 100 MG capsule Take 1 capsule (100 mg total) by mouth 3 (three) times daily as needed for cough. 11/27/15   Gloriann Loan, PA-C  clonazePAM (KLONOPIN) 1 MG tablet Take 1 mg by mouth 4 (four) times daily as needed. For anxiety    Historical Provider, MD  esomeprazole (NEXIUM) 40 MG capsule Take 40 mg by mouth daily at 12 noon.    Historical Provider, MD  hydrocortisone cream 1 % Apply 1 application topically 2 (two) times daily as needed. For itching  Historical Provider, MD  hydroxychloroquine (PLAQUENIL) 200 MG tablet Take 200 mg by mouth 2 (two) times daily.    Historical Provider, MD  nystatin (MYCOSTATIN/NYSTOP) 100000 UNIT/GM POWD For use as recommended per package insert to affected areas (under breast) Patient not taking: Reported on 08/11/2015 11/06/14   Velvet Bathe, MD  sertraline (ZOLOFT) 100 MG tablet Take 100 mg by mouth daily.    Historical Provider, MD  topiramate (TOPAMAX) 50 MG tablet Take 50 mg by mouth 2 (two) times daily.    Historical Provider, MD  traMADol (ULTRAM) 50 MG tablet Take 1 tablet (50 mg total) by mouth every 6 (six) hours as needed for moderate pain. Patient not taking: Reported on 08/11/2015  11/06/14   Velvet Bathe, MD  triamterene-hydrochlorothiazide (MAXZIDE-25) 37.5-25 MG per tablet Take 1 tablet by mouth daily.    Historical Provider, MD  Vitamin D, Ergocalciferol, (DRISDOL) 50000 units CAPS capsule Take 50,000 Units by mouth every 7 (seven) days. Take on Mondays    Historical Provider, MD    Family History Family History  Problem Relation Age of Onset  . Colon polyps Maternal Grandmother   . Lung cancer Maternal Grandmother   . Liver disease Father     agent orange  . Diabetes Maternal Grandfather   . Heart disease Maternal Grandfather   . Hypertension Maternal Grandfather   . Colon cancer Maternal Uncle     great uncle    Social History Social History  Substance Use Topics  . Smoking status: Former Smoker    Types: Cigarettes    Quit date: 03/09/1999  . Smokeless tobacco: Never Used  . Alcohol use No     Allergies   Ciprofloxacin; Gabapentin; Nsaids; Penicillins; Codeine; Morphine and related; and Sulfa antibiotics   Review of Systems Review of Systems  Constitutional: Positive for fever (subjective).  HENT: Positive for congestion.   Respiratory: Positive for cough (productive of pink sputum ) and wheezing.      Physical Exam Updated Vital Signs BP 113/66 (BP Location: Left Arm)   Pulse 94   Temp 99.4 F (37.4 C) (Oral)   Resp 16   LMP 06/23/2009   SpO2 97%   Physical Exam  Constitutional: She is oriented to person, place, and time. She appears well-developed and well-nourished.  Non-toxic appearance. She does not have a sickly appearance. She does not appear ill.  HENT:  Head: Normocephalic and atraumatic.  Right Ear: External ear normal.  Left Ear: External ear normal.  Mouth/Throat: Oropharynx is clear and moist.  Eyes: Conjunctivae are normal. Pupils are equal, round, and reactive to light. No scleral icterus.  Neck: Normal range of motion. Neck supple. No tracheal deviation present.  Cardiovascular: Normal rate and regular rhythm.     No unilateral lower extremity edema.   Pulmonary/Chest: Effort normal and breath sounds normal. No accessory muscle usage or stridor. No respiratory distress. She has no wheezes. She has no rhonchi. She has no rales.  Abdominal: Soft. Bowel sounds are normal. She exhibits no distension. There is no tenderness.  Musculoskeletal: Normal range of motion.  Lymphadenopathy:    She has no cervical adenopathy.  Neurological: She is alert and oriented to person, place, and time.  Speech clear without dysarthria.  Skin: Skin is warm and dry.  Psychiatric: She has a normal mood and affect. Her behavior is normal.     ED Treatments / Results   DIAGNOSTIC STUDIES:  Oxygen Saturation is 97% on RA, normal by my interpretation.  COORDINATION OF CARE:  4:52 PM Pt was advised Discussed treatment plan with pt at bedside and pt agreed to plan.  Labs (all labs ordered are listed, but only abnormal results are displayed) Labs Reviewed - No data to display  EKG  EKG Interpretation None       Radiology Dg Chest 2 View  Result Date: 11/27/2015 CLINICAL DATA:  Cough, possible pneumonia, shortness of breath EXAM: CHEST  2 VIEW COMPARISON:  08/04/2015 FINDINGS: Cardiomediastinal silhouette is stable. No acute infiltrate or pleural effusion. No pulmonary edema. Bony thorax is unremarkable. IMPRESSION: No active cardiopulmonary disease. Electronically Signed   By: Lahoma Crocker [name]   On: 11/27/2015 16:07    Procedures Procedures (including critical care time)  Medications Ordered in ED Medications - No data to display   Initial Impression / Assessment and Plan / ED Course  I have reviewed the triage vital signs and the nursing notes.  Pertinent labs & imaging results that were available during my care of the patient were reviewed by me and considered in my medical decision making (see chart for details).  Clinical Course   Pt symptoms consistent with URI. CXR negative for acute infiltrate.  Low risk PE using Well's criteria, clinically presentation consistent with bronchitis.  Pt will be discharged with symptomatic treatment.  Discussed strict return precautions including worsenign shortness of breath or sudden worsening chest pain.  Pt is hemodynamically stable & in NAD prior to discharge.  Final Clinical Impressions(s) / ED Diagnoses   Final diagnoses:  URI (upper respiratory infection)    New Prescriptions New Prescriptions   ALBUTEROL (PROVENTIL HFA;VENTOLIN HFA) 108 (90 BASE) MCG/ACT INHALER    Inhale 2 puffs into the lungs every 4 (four) hours as needed for wheezing or shortness of breath.   BENZONATATE (TESSALON) 100 MG CAPSULE    Take 1 capsule (100 mg total) by mouth 3 (three) times daily as needed for cough.   I personally performed the services described in this documentation, which was scribed in my presence. The recorded information has been reviewed and is accurate.     Gloriann Loan, PA-C 11/27/15 [place], MD 11/28/15 [phone]

## 2015-11-27 NOTE — ED Triage Notes (Signed)
Cough since wed is no better , saw her dr and he wants her to have a Cxr

## 2015-11-27 NOTE — ED Notes (Signed)
Declined W/C at D/C and was escorted to lobby by RN. 

## 2016-03-01 ENCOUNTER — Other Ambulatory Visit (HOSPITAL_COMMUNITY): Payer: Self-pay | Admitting: Nurse Practitioner

## 2016-03-01 DIAGNOSIS — R1010 Upper abdominal pain, unspecified: Secondary | ICD-10-CM

## 2016-03-06 ENCOUNTER — Ambulatory Visit (HOSPITAL_COMMUNITY)
Admission: RE | Admit: 2016-03-06 | Discharge: 2016-03-06 | Disposition: A | Discharge status: Home / Self Care | Payer: 59 | Source: Ambulatory Visit | Attending: Nurse Practitioner | Admitting: Nurse Practitioner

## 2016-03-06 DIAGNOSIS — R1010 Upper abdominal pain, unspecified: Secondary | ICD-10-CM | POA: Diagnosis present

## 2016-03-06 DIAGNOSIS — K760 Fatty (change of) liver, not elsewhere classified: Secondary | ICD-10-CM | POA: Insufficient documentation

## 2016-03-14 ENCOUNTER — Ambulatory Visit: Payer: 59 | Admitting: Physician Assistant

## 2016-03-18 ENCOUNTER — Ambulatory Visit (HOSPITAL_COMMUNITY): Payer: 59

## 2016-04-13 ENCOUNTER — Emergency Department (HOSPITAL_COMMUNITY): Payer: 59

## 2016-04-13 ENCOUNTER — Encounter (HOSPITAL_COMMUNITY): Payer: Self-pay | Admitting: Emergency Medicine

## 2016-04-13 ENCOUNTER — Emergency Department (HOSPITAL_COMMUNITY)
Admission: EM | Admit: 2016-04-13 | Discharge: 2016-04-13 | Disposition: A | Discharge status: Home / Self Care | Payer: 59 | Attending: Emergency Medicine | Admitting: Emergency Medicine

## 2016-04-13 DIAGNOSIS — Z87891 Personal history of nicotine dependence: Secondary | ICD-10-CM | POA: Insufficient documentation

## 2016-04-13 DIAGNOSIS — I10 Essential (primary) hypertension: Secondary | ICD-10-CM | POA: Insufficient documentation

## 2016-04-13 DIAGNOSIS — Z79899 Other long term (current) drug therapy: Secondary | ICD-10-CM | POA: Diagnosis not present

## 2016-04-13 DIAGNOSIS — M546 Pain in thoracic spine: Secondary | ICD-10-CM | POA: Diagnosis not present

## 2016-04-13 DIAGNOSIS — E119 Type 2 diabetes mellitus without complications: Secondary | ICD-10-CM | POA: Diagnosis not present

## 2016-04-13 LAB — BASIC METABOLIC PANEL
ANION GAP: 10 (ref 5–15)
BUN: 15 mg/dL (ref 6–20)
CALCIUM: 9.4 mg/dL (ref 8.9–10.3)
CO2: 23 mmol/L (ref 22–32)
Chloride: 105 mmol/L (ref 101–111)
Creatinine, Ser: 0.72 mg/dL (ref 0.44–1.00)
GFR calc Af Amer: >60 mL/min (ref >60)
GLUCOSE: 103 mg/dL — AB (ref 65–99)
POTASSIUM: 3.1 mmol/L — AB (ref 3.5–5.1)
SODIUM: 138 mmol/L (ref 135–145)

## 2016-04-13 LAB — CBC WITH DIFFERENTIAL/PLATELET
BASOS ABS: 0.1 10*3/uL (ref 0.0–0.1)
BASOS PCT: 1 %
EOS PCT: 6 %
Eosinophils Absolute: 0.5 10*3/uL (ref 0.0–0.7)
HCT: 40.7 % (ref 36.0–46.0)
Hemoglobin: 13 g/dL (ref 12.0–15.0)
Lymphocytes Relative: 25 %
Lymphs Abs: 2.1 10*3/uL (ref 0.7–4.0)
MCH: 24.3 pg — ABNORMAL LOW (ref 26.0–34.0)
MCHC: 31.9 g/dL (ref 30.0–36.0)
MCV: 76.1 fL — ABNORMAL LOW (ref 78.0–100.0)
MONO ABS: 0.4 10*3/uL (ref 0.1–1.0)
Monocytes Relative: 5 %
Neutro Abs: 5.2 10*3/uL (ref 1.7–7.7)
Neutrophils Relative %: 63 %
PLATELETS: 159 10*3/uL (ref 150–400)
RBC: 5.35 MIL/uL — AB (ref 3.87–5.11)
RDW: 16.1 % — AB (ref 11.5–15.5)
WBC: 8.2 10*3/uL (ref 4.0–10.5)

## 2016-04-13 LAB — URINALYSIS, ROUTINE W REFLEX MICROSCOPIC
Bilirubin Urine: NEGATIVE
GLUCOSE, UA: NEGATIVE mg/dL
HGB URINE DIPSTICK: NEGATIVE
KETONES UR: NEGATIVE mg/dL
LEUKOCYTES UA: NEGATIVE
Nitrite: NEGATIVE
PROTEIN: NEGATIVE mg/dL
Specific Gravity, Urine: 1.016 (ref 1.005–1.030)
pH: 6 (ref 5.0–8.0)

## 2016-04-13 LAB — D-DIMER, QUANTITATIVE (NOT AT ARMC): D DIMER QUANT: 0.34 ug{FEU}/mL (ref 0.00–0.50)

## 2016-04-13 MED ORDER — DICLOFENAC 18 MG PO CAPS: 18.0000 mg | ORAL_CAPSULE | Freq: Two times a day (BID) | ORAL | 0 refills | Status: DC

## 2016-04-13 MED ORDER — CYCLOBENZAPRINE HCL 10 MG PO TABS
10.0000 mg | ORAL_TABLET | Freq: Once | ORAL | Status: AC
  Administered 2016-04-13: 10 mg via ORAL
  Filled 2016-04-13: qty 1

## 2016-04-13 MED ORDER — POTASSIUM CHLORIDE CRYS ER 20 MEQ PO TBCR
40.0000 meq | EXTENDED_RELEASE_TABLET | Freq: Once | ORAL | Status: AC
  Administered 2016-04-13: 40 meq via ORAL
  Filled 2016-04-13: qty 2

## 2016-04-13 MED ORDER — CYCLOBENZAPRINE HCL 10 MG PO TABS: 10.0000 mg | ORAL_TABLET | Freq: Two times a day (BID) | ORAL | 0 refills | Status: DC | PRN

## 2016-04-13 NOTE — ED Triage Notes (Signed)
Pt. Stated, My middle part of my back hurts for 2 weeks, no injury.

## 2016-04-13 NOTE — ED Provider Notes (Signed)
[facility] DEPT Provider Note   CSN: [id number] Arrival date & time: 04/13/16  1245  By signing my name below, I, Delton Prairie, attest that this documentation has been prepared under the direction and in the presence of  Gloriann Loan, PA-C. Electronically Signed: Delton Pra[place], ED Scribe. 04/13/16. 1:36 PM.  History   Chief Complaint Chief Complaint  Patient presents with  . Back Pain   The history is provided by the patient. No language interpreter was used.   HPI Comments:  Melinda Porter is a 45 y.o. female, with a hx of fibromyalgia and lupus, who presents to the Emergency Department complaining of sudden onset, worsening, constant right sided back pain x 2 weeks. She also reports muscle spasms, right rib pain and mild SOB. Her SOB is worse when laying flat and her pain is worse with movement. She is ambulatory. Pt has taken naproxen and robaxin with no relief. She also notes weakness in her bilateral lower extremities which is her baseline and that she currently has an UTI for which she is taking Keflex. Pt denies radiation of her pain, any injury to her back, fevers, any other associated symptoms and any other modifying factors at this time. No hx of DVT/PE.   Past Medical History:  Diagnosis Date  . Anxiety   . Arthritis   . Depression   . DM (diabetes mellitus) ([place])   . Endometriosis   . Hiatal hernia   . Hyperlipemia   . Hypertension   . Lupus   . PTSD (post-traumatic stress disorder)     Patient Active Problem List   Diagnosis Date Noted  . Headache 11/05/2014  . Persistent headaches 11/05/2014  . Facial droop 11/05/2014  . DM (diabetes mellitus) ([place]) 11/05/2014  . Hypokalemia 11/05/2014  . Syncope 11/05/2014  . Hypertension 11/05/2014  . Hyperlipemia 11/05/2014  . Psoriatic arthritis ([place]) 11/05/2014  . Hypoglycemia 04/13/2014  . Abdominal pain, epigastric 03/08/2014  . Belching 03/08/2014  . Nausea without vomiting 03/08/2014    Past Surgical History:   Procedure Laterality Date  . CESAREAN SECTION  1997  . OOPHORECTOMY Bilateral   . PARTIAL HYSTERECTOMY  07/2009  . TEMPOROMANDIBULAR JOINT SURGERY  2009   right side  . TONSILLECTOMY  2009    OB History    Gravida Para Term Preterm AB Living   2 1     1 1    SAB TAB Ectopic Multiple Live Births     1             Home Medications    Prior to Admission medications   Medication Sig Start Date End Date Taking? Authorizing Provider  albuterol (PROVENTIL HFA;VENTOLIN HFA) 108 (90 Base) MCG/ACT inhaler Inhale 2 puffs into the lungs every 4 (four) hours as needed for wheezing or shortness of breath. 11/27/15   Gloriann Loan, PA-C  amoxicillin-clavulanate (AUGMENTIN) 875-125 MG tablet Take 1 tablet by mouth 2 (two) times daily.    Historical Provider, MD  Apremilast (OTEZLA) 30 MG TABS Take 1 tablet by mouth daily.    Historical Provider, MD  benzonatate (TESSALON) 100 MG capsule Take 1 capsule (100 mg total) by mouth 3 (three) times daily as needed for cough. 11/27/15   Gloriann Loan, PA-C  clonazePAM (KLONOPIN) 1 MG tablet Take 1 mg by mouth 4 (four) times daily as needed. For anxiety    Historical Provider, MD  cyclobenzaprine (FLEXERIL) 10 MG tablet Take 1 tablet (10 mg total) by mouth 2 (two) times daily as  needed for muscle spasms. 04/13/16   Gloriann Loan, PA-C  Diclofenac 18 MG CAPS Take 18 mg by mouth 2 (two) times daily. 04/13/16   Gloriann Loan, PA-C  esomeprazole (NEXIUM) 40 MG capsule Take 40 mg by mouth daily at 12 noon.    Historical Provider, MD  hydrocortisone cream 1 % Apply 1 application topically 2 (two) times daily as needed. For itching    Historical Provider, MD  hydroxychloroquine (PLAQUENIL) 200 MG tablet Take 200 mg by mouth 2 (two) times daily.    Historical Provider, MD  nystatin (MYCOSTATIN/NYSTOP) 100000 UNIT/GM POWD For use as recommended per package insert to affected areas (under breast) Patient not taking: Reported on 08/11/2015 11/06/14   Velvet Bathe, MD  sertraline  (ZOLOFT) 100 MG tablet Take 100 mg by mouth daily.    Historical Provider, MD  topiramate (TOPAMAX) 50 MG tablet Take 50 mg by mouth 2 (two) times daily.    Historical Provider, MD  traMADol (ULTRAM) 50 MG tablet Take 1 tablet (50 mg total) by mouth every 6 (six) hours as needed for moderate pain. Patient not taking: Reported on 08/11/2015 11/06/14   Velvet Bathe, MD  triamterene-hydrochlorothiazide (MAXZIDE-25) 37.5-25 MG per tablet Take 1 tablet by mouth daily.    Historical Provider, MD  Vitamin D, Ergocalciferol, (DRISDOL) 50000 units CAPS capsule Take 50,000 Units by mouth every 7 (seven) days. Take on Mondays    Historical Provider, MD    Family History Family History  Problem Relation Age of Onset  . Colon polyps Maternal Grandmother   . Lung cancer Maternal Grandmother   . Liver disease Father     agent orange  . Diabetes Maternal Grandfather   . Heart disease Maternal Grandfather   . Hypertension Maternal Grandfather   . Colon cancer Maternal Uncle     great uncle    Social History Social History  Substance Use Topics  . Smoking status: Former Smoker    Types: Cigarettes    Quit date: 03/09/1999  . Smokeless tobacco: Never Used  . Alcohol use No     Allergies   Ciprofloxacin; Gabapentin; Nsaids; Penicillins; Codeine; Morphine and related; and Sulfa antibiotics   Review of Systems Review of Systems 10 systems reviewed and all are negative for acute change except as noted in the HPI.  Physical Exam Updated Vital Signs BP 134/93 (BP Location: Left Arm)   Pulse 106   Temp 98.8 F (37.1 C) (Oral)   Resp 17   Ht 5' 4"  (1.626 m)   Wt 109.5 kg   LMP 06/23/2009   SpO2 97%   BMI 41.42 kg/m   Physical Exam  Constitutional: She is oriented to person, place, and time. She appears well-developed and well-nourished.  HENT:  Head: Atraumatic.  Eyes: Conjunctivae are normal.  Cardiovascular: Normal rate, regular rhythm, normal heart sounds and intact distal pulses.     Pulses:      Dorsalis pedis pulses are 2+ on the right side, and 2+ on the left side.  Pulmonary/Chest: Effort normal and breath sounds normal. She exhibits tenderness.  Abdominal: Soft. Bowel sounds are normal. She exhibits no distension. There is no tenderness.  No CVA tenderness   Musculoskeletal: She exhibits tenderness.  No spinous process tenderness. No step offs. No crepitus. Right posterolateral lower ribs tender to palpation without crepitus. Mild thoracic midline tenderness.   Neurological: She is alert and oriented to person, place, and time.  No saddle anesthesia. Strength and sensation intact bilaterally throughout lower extremities.  Slow gait  Skin: Skin is warm and dry.  Psychiatric: She has a normal mood and affect. Her behavior is normal.     ED Treatments / Results  DIAGNOSTIC STUDIES:  Oxygen Saturation is 97% on RA, normal by my interpretation.    COORDINATION OF CARE:  1:23 PM Discussed treatment plan with pt at bedside and pt agreed to plan.  Labs (all labs ordered are listed, but only abnormal results are displayed) Labs Reviewed  BASIC METABOLIC PANEL - Abnormal; Notable for the following:       Result Value   Potassium 3.1 (*)    Glucose, Bld 103 (*)    All other components within normal limits  CBC WITH DIFFERENTIAL/PLATELET - Abnormal; Notable for the following:    RBC 5.35 (*)    MCV 76.1 (*)    MCH 24.3 (*)    RDW 16.1 (*)    All other components within normal limits  URINALYSIS, ROUTINE W REFLEX MICROSCOPIC  D-DIMER, QUANTITATIVE (NOT AT [facility])    EKG  EKG Interpretation None       Radiology Dg Chest 2 View  Result Date: 04/13/2016 CLINICAL DATA:  Right sided posterior tenderness and spasm, which has been worsening over the past 3 weeks; Hx of fibromyalgia, lupus, arthritic, lumbar spinal stenosis; No trauma EXAM: CHEST  2 VIEW COMPARISON:  11/27/2015 FINDINGS: The heart size and mediastinal contours are within normal limits. Both  lungs are clear. No pleural effusion or pneumothorax. The visualized skeletal structures are unremarkable. IMPRESSION: No active cardiopulmonary disease. Electronically Signed   By: Lajean Manes [name]   On: 04/13/2016 13:51    Procedures Procedures (including critical care time)  Medications Ordered in ED Medications  potassium chloride SA (K-DUR,KLOR-CON) CR tablet 40 mEq (not administered)  cyclobenzaprine (FLEXERIL) tablet 10 mg (10 mg Oral Given 04/13/16 1430)     Initial Impression / Assessment and Plan / ED Course  I have reviewed the triage vital signs and the nursing notes.  Pertinent labs & imaging results that were available during my care of the patient were reviewed by me and considered in my medical decision making (see chart for details).  Clinical Course    Patient with PMH of Lupus, PsA, and FM presents with right sided thoracic back pain.  Right posterolateral lower ribs TTP and reproduces pain.  No CVA tenderness.  Lungs CTAB.  Consider pyelonephritis with recent UTI and keflex, but UA without infection. Consider PE, but dimer negative.  CXR clear.  Labs without acute abnormalities aside from hypokalemia, which was repleted orally in the ED.  Likely musculoskeletal in that its reproducible, worse with movement, and workup reassuring.  Plan to discharge home with short course diclofenac and flexeril.  Discussed close PCP and/or rheumatology follow up.  Return precautions discussed.  Stable for discharge.   Case has been discussed with and seen by Dr. Ashok Cordia who agrees with the above plan for discharge.    Final Clinical Impressions(s) / ED Diagnoses   Final diagnoses:  Acute right-sided thoracic back pain    New Prescriptions New Prescriptions   CYCLOBENZAPRINE (FLEXERIL) 10 MG TABLET    Take 1 tablet (10 mg total) by mouth 2 (two) times daily as needed for muscle spasms.   DICLOFENAC 18 MG CAPS    Take 18 mg by mouth 2 (two) times daily.   I personally performed  the services described in this documentation, which was scribed in my presence. The recorded information has been reviewed and  is accurate.     Gloriann Loan, PA-C 04/13/16 1631    Lajean Saver, MD 04/14/16 0730

## 2016-04-13 NOTE — Discharge Instructions (Signed)
Start taking Dicofenac and Flexeril for pain.  Follow up with your rheumatologist or primary care doctor.  Return to the ED for any new or worsening symptoms.

## 2016-04-13 NOTE — ED Notes (Signed)
Tried to get blood from patient pt hard stick will call for somebody else to get blood work

## 2016-04-19 ENCOUNTER — Other Ambulatory Visit (HOSPITAL_COMMUNITY): Payer: Self-pay | Admitting: Nurse Practitioner

## 2016-04-19 DIAGNOSIS — G8929 Other chronic pain: Secondary | ICD-10-CM

## 2016-04-19 DIAGNOSIS — M545 Low back pain, unspecified: Secondary | ICD-10-CM

## 2016-04-24 ENCOUNTER — Ambulatory Visit (HOSPITAL_COMMUNITY)
Admission: RE | Admit: 2016-04-24 | Discharge: 2016-04-24 | Disposition: A | Discharge status: Home / Self Care | Payer: 59 | Source: Ambulatory Visit | Attending: Nurse Practitioner | Admitting: Nurse Practitioner

## 2016-04-24 ENCOUNTER — Encounter (HOSPITAL_COMMUNITY): Payer: Self-pay | Admitting: Radiology

## 2016-04-24 DIAGNOSIS — M5126 Other intervertebral disc displacement, lumbar region: Secondary | ICD-10-CM | POA: Insufficient documentation

## 2016-04-24 DIAGNOSIS — G8929 Other chronic pain: Secondary | ICD-10-CM

## 2016-04-24 DIAGNOSIS — M545 Low back pain, unspecified: Secondary | ICD-10-CM

## 2016-05-08 DIAGNOSIS — M47816 Spondylosis without myelopathy or radiculopathy, lumbar region: Secondary | ICD-10-CM | POA: Diagnosis not present

## 2016-05-13 ENCOUNTER — Ambulatory Visit: Payer: 59 | Admitting: Family Medicine

## 2016-05-17 ENCOUNTER — Ambulatory Visit: Payer: 59 | Admitting: Family Medicine

## 2016-05-24 DIAGNOSIS — L4050 Arthropathic psoriasis, unspecified: Secondary | ICD-10-CM | POA: Diagnosis not present

## 2016-05-24 DIAGNOSIS — M797 Fibromyalgia: Secondary | ICD-10-CM | POA: Diagnosis not present

## 2016-05-24 DIAGNOSIS — R768 Other specified abnormal immunological findings in serum: Secondary | ICD-10-CM | POA: Diagnosis not present

## 2016-05-24 LAB — CBC AND DIFFERENTIAL
HCT: 42 % (ref 36–46)
Hemoglobin: 13.7 g/dL (ref 12.0–16.0)
NEUTROS ABS: 68 /uL
PLATELETS: 164 10*3/uL (ref 150–399)
WBC: 8.3 10*3/mL

## 2016-05-24 LAB — ANA: ANA Direct: POSITIVE

## 2016-05-31 ENCOUNTER — Ambulatory Visit: Payer: 59 | Admitting: Family Medicine

## 2016-06-14 ENCOUNTER — Encounter: Payer: Self-pay | Admitting: Family Medicine

## 2016-06-14 ENCOUNTER — Ambulatory Visit (INDEPENDENT_AMBULATORY_CARE_PROVIDER_SITE_OTHER): Payer: 59 | Admitting: Family Medicine

## 2016-06-14 VITALS — BP 118/80 | HR 82 | Temp 98.1°F | Ht 61.61 in | Wt 238.8 lb

## 2016-06-14 DIAGNOSIS — Z7689 Persons encountering health services in other specified circumstances: Secondary | ICD-10-CM

## 2016-06-14 DIAGNOSIS — M797 Fibromyalgia: Secondary | ICD-10-CM | POA: Diagnosis not present

## 2016-06-14 DIAGNOSIS — L4050 Arthropathic psoriasis, unspecified: Secondary | ICD-10-CM | POA: Diagnosis not present

## 2016-06-14 DIAGNOSIS — M79672 Pain in left foot: Secondary | ICD-10-CM

## 2016-06-14 DIAGNOSIS — M79671 Pain in right foot: Secondary | ICD-10-CM | POA: Diagnosis not present

## 2016-06-14 DIAGNOSIS — F419 Anxiety disorder, unspecified: Secondary | ICD-10-CM

## 2016-06-14 DIAGNOSIS — I10 Essential (primary) hypertension: Secondary | ICD-10-CM | POA: Diagnosis not present

## 2016-06-14 MED ORDER — CLONAZEPAM 1 MG PO TABS: 1.0000 mg | ORAL_TABLET | Freq: Four times a day (QID) | ORAL | 1 refills | Status: DC | PRN

## 2016-06-14 MED ORDER — CYCLOBENZAPRINE HCL ER 15 MG PO CP24: 15.0000 mg | ORAL_CAPSULE | Freq: Every evening | ORAL | 1 refills | Status: DC | PRN

## 2016-06-14 NOTE — Progress Notes (Signed)
Pre visit review using our clinic review tool, if applicable. No additional management support is needed unless otherwise documented below in the visit note. 

## 2016-06-14 NOTE — Patient Instructions (Signed)
Please increase your sleep  Try to ice your feet at night before bed Follow up in 85month

## 2016-06-14 NOTE — Progress Notes (Signed)
Subjective:    Patient ID: Melinda Porter, female    DOB: Aug 16, 1970, 46 y.o.   MRN: [id number]  HPI This is a 46 yo female who presents today to establish care. Was seeing PCP in [place] but has moved to [place] and this is more convenient for her.   Was healthy until 2011 when she had a partial hysterectomy, ovarian cysts. She then developed endometriosis and had one ovary removed. She eventually had to have her other ovary removed.   She sees Dr Amil Amen (rheumatology) for lupus, psoriatic arthritis, sjogrens and fibromyaglgia. Last saw him 2 weeks ago, had labs, "normal" per patient.  Has been previously seen at rheumatology clinic in [facility], last visit in record 11/07/15 stated the following: " Polyarthralgia/history of Sjogren's: no clinical evidence of active inflammatory arthritis on exam or tenosynovitis. Diffuse pain is likely multifactorial, and recent [facility] ultrasound findings to evaluate for subclinical synovitis without evidence of active synovitis. 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 Her serologies are notable for +ANA and +SSA and she does have sicca symptoms suggesting possible sjogren's syndrome, however would not warrant escalation in DMARD therapy in the absence of inflammatory arthritis or other internal organ involvement. I believe that her polyarthralgia is mostly NON-inflammatory in nature (as discussed below). Additional support is that her pain has not been steroid responsive in the past." Patient reports  terrible stiffness, swelling and fatigue. Constant leg pain from knees to feet. Feet go numb, feels like muscle is being pulled from the bone. Has bone spurs on heels. Sleeps well but does not feel rested. Sees Dr. Amil Amen, currently out of work due to pain. Works from home, unable to sit in her chair for long periods of time. Has tried lyrica but had allergic reaction. Unable to tolerate gabapentin, "makes me out of my head."  Has two dogs that she  walks. Otherwise no exercise. Had PT for bone spurs in feet, did not help.  She is currently taking care of her grandmother who has end stage lung cancer and is under Hospice care, reports that she is incredibly stressed and pain is making her more anxious and short tempered. Has been on clonazepam for many years, is currently requiring 3-4 per day during this stressful time.  Has seen Dr. Letta Median previously for episodes of hypoglycemia, Hgba1C <6 %. Has learned how to eat to avoid drops in blood sugar.  Past Medical History:  Diagnosis Date  . Anxiety   . Arthritis   . Depression   . DM (diabetes mellitus) ([place])   . Endometriosis   . Hiatal hernia   . Hyperlipemia   . Hypertension   . Lupus   . PTSD (post-traumatic stress disorder)    Past Surgical History:  Procedure Laterality Date  . CESAREAN SECTION  1997  . OOPHORECTOMY Bilateral   . PARTIAL HYSTERECTOMY  07/2009  . TEMPOROMANDIBULAR JOINT SURGERY  2009   right side  . TONSILLECTOMY  2009   Family History  Problem Relation Age of Onset  . Colon polyps Maternal Grandmother   . Lung cancer Maternal Grandmother   . Liver disease Father     agent orange  . Diabetes Maternal Grandfather   . Heart disease Maternal Grandfather   . Hypertension Maternal Grandfather   . Colon cancer Maternal Uncle     great uncle   Social History  Substance Use Topics  . Smoking status: Former Smoker    Types: Cigarettes    Quit date:  03/09/1999  . Smokeless tobacco: Never Used  . Alcohol use No      Review of Systems Per HPI    Objective:   Physical Exam  Constitutional: She is oriented to person, place, and time. She appears well-developed and well-nourished. No distress.  Obese.   HENT:  Head: Normocephalic and atraumatic.  Right Ear: External ear normal.  Left Ear: External ear normal.  Nose: Nose normal.  Mouth/Throat: Oropharynx is clear and moist.  Eyes: Conjunctivae are normal.  Neck: Normal range of motion. Neck  supple.  Cardiovascular: Normal rate, regular rhythm and normal heart sounds.   Pulmonary/Chest: Effort normal and breath sounds normal.  Lymphadenopathy:    She has no cervical adenopathy.  Neurological: She is alert and oriented to person, place, and time.  Skin: Skin is warm and dry. Rash (scaling of scalp behind ears. ) noted. She is not diaphoretic.  Psychiatric: She has a normal mood and affect. Her behavior is normal. Judgment and thought content normal.  Vitals reviewed.    BP 118/80 (BP Location: Left Arm, Patient Position: Sitting, Cuff Size: Large)   Pulse 82   Temp 98.1 F (36.7 C) (Oral)   Ht 5' 1.61" (1.565 m)   Wt 238 lb 12.8 oz (108.3 kg)   LMP 06/23/2009   SpO2 97%   BMI 44.23 kg/m   Wt Readings from Last 3 Encounters:  04/13/16 241 lb 5 oz (109.5 kg)  08/11/15 226 lb (102.5 kg)  11/05/14 231 lb 9.6 oz (105.1 kg)       Assessment & Plan:  1. Encounter to establish care - Discussed and encouraged healthy lifestyle choices- adequate sleep, regular exercise, stress management and healthy food choices.  - she had labs done at rheumatology 2 weeks ago, will request records  2. Essential hypertension - well controlled on Maxide, continue  3. Psoriatic arthritis ([place]) - continue current meds and rheumatology follow up  4. Pain in both feet - will try long acting cyclobenzaprine for pain - cyclobenzaprine (AMRIX) 15 MG 24 hr capsule; Take 1 capsule (15 mg total) by mouth at bedtime as needed for muscle spasms.  Dispense: 30 capsule; Refill: 1 - suggested icing feet at end of day  5. Anxiety - discussed potential for dependence with benzos, she agreed to obtain controlled substances from single provider and urine drug screening as requested.  - increased stress and anxiety with grandmother's terminal illness, will work on decreasing clonazepam in near future - clonazePAM (KLONOPIN) 1 MG tablet; Take 1 tablet (1 mg total) by mouth 4 (four) times daily as  needed. For anxiety  Dispense: 120 tablet; Refill: 1  6. Fibromyalgia - encouraged increased sleep, increased walking and stretching as tolerated - cyclobenzaprine (AMRIX) 15 MG 24 hr capsule; Take 1 capsule (15 mg total) by mouth at bedtime as needed for muscle spasms.  Dispense: 30 capsule; Refill: 1  - 1 month follow up  Clarene Reamer, FNP-BC  [facility] [place]  06/16/2016 7:10 PM

## 2016-06-17 ENCOUNTER — Telehealth: Payer: Self-pay

## 2016-06-17 NOTE — Telephone Encounter (Signed)
Pt was seen on 06/14/16, RX for Amrix ER 69m capsule was prescribed with denial from insurance company. Spoke with pharmacy--pt has not picked up script--is there an alternate we can send in for patient?

## 2016-06-17 NOTE — Telephone Encounter (Signed)
error 

## 2016-06-21 ENCOUNTER — Encounter: Payer: Self-pay | Admitting: Family Medicine

## 2016-07-01 ENCOUNTER — Telehealth: Payer: Self-pay | Admitting: Family Medicine

## 2016-07-01 ENCOUNTER — Other Ambulatory Visit: Payer: Self-pay | Admitting: Family Medicine

## 2016-07-01 MED ORDER — TRIAMTERENE-HCTZ 37.5-25 MG PO TABS: 1.0000 | ORAL_TABLET | Freq: Every day | ORAL | 1 refills | Status: DC

## 2016-07-01 MED ORDER — SERTRALINE HCL 100 MG PO TABS: 100.0000 mg | ORAL_TABLET | Freq: Every day | ORAL | 1 refills | Status: DC

## 2016-07-01 NOTE — Telephone Encounter (Signed)
Okay to fill prescription.

## 2016-07-01 NOTE — Telephone Encounter (Signed)
Patient is requesting a prescription for her bp medicine and zoloft. She was prescribed these from prior PCP.  Also her Flexoril extended release is not covered by her insurance,  Thank you,  -LL

## 2016-07-01 NOTE — Telephone Encounter (Signed)
I have sent in prescriptions for Zoloft and maxzide to pharmacy. We will talk about additional treatments for her fibro at her follow up visit.

## 2016-07-02 NOTE — Telephone Encounter (Signed)
Called and spoke with pt informing her of her medications being called into pharmacy and Jackelyn Poling would discuss additional treatments at her follow up appointment. Pt verbalized understanding nothing further needed at this time.

## 2016-07-05 ENCOUNTER — Other Ambulatory Visit: Payer: Self-pay | Admitting: Family Medicine

## 2016-07-13 ENCOUNTER — Other Ambulatory Visit: Payer: Self-pay | Admitting: Family Medicine

## 2016-07-16 ENCOUNTER — Other Ambulatory Visit: Payer: Self-pay | Admitting: Family Medicine

## 2016-07-16 DIAGNOSIS — Z1231 Encounter for screening mammogram for malignant neoplasm of breast: Secondary | ICD-10-CM

## 2016-07-26 ENCOUNTER — Ambulatory Visit: Payer: 59 | Admitting: Family Medicine

## 2016-07-30 ENCOUNTER — Telehealth: Payer: Self-pay | Admitting: Family Medicine

## 2016-07-30 NOTE — Telephone Encounter (Signed)
ROI faxed to [facility] refaxed 07/30/2016.

## 2016-07-30 NOTE — Telephone Encounter (Signed)
ROI faxed to [facility] refaxed 07/30/2016.mwc

## 2016-08-02 ENCOUNTER — Ambulatory Visit (INDEPENDENT_AMBULATORY_CARE_PROVIDER_SITE_OTHER): Payer: 59 | Admitting: Family Medicine

## 2016-08-02 VITALS — BP 122/84 | HR 83 | Temp 97.5°F | Wt 240.2 lb

## 2016-08-02 DIAGNOSIS — R300 Dysuria: Secondary | ICD-10-CM | POA: Diagnosis not present

## 2016-08-02 DIAGNOSIS — N3090 Cystitis, unspecified without hematuria: Secondary | ICD-10-CM

## 2016-08-02 DIAGNOSIS — M797 Fibromyalgia: Secondary | ICD-10-CM | POA: Diagnosis not present

## 2016-08-02 LAB — POCT URINALYSIS DIPSTICK
Bilirubin, UA: NEGATIVE
Glucose, UA: NEGATIVE
KETONES UA: NEGATIVE
Nitrite, UA: NEGATIVE
PH UA: 6 (ref 5.0–8.0)
PROTEIN UA: NEGATIVE
RBC UA: NEGATIVE
Urobilinogen, UA: 0.2 E.U./dL

## 2016-08-02 MED ORDER — CYCLOBENZAPRINE HCL 10 MG PO TABS: 10.0000 mg | ORAL_TABLET | Freq: Three times a day (TID) | ORAL | 1 refills | Status: DC | PRN

## 2016-08-02 MED ORDER — CEPHALEXIN 500 MG PO CAPS: 500.0000 mg | ORAL_CAPSULE | Freq: Three times a day (TID) | ORAL | 0 refills | Status: DC

## 2016-08-02 NOTE — Patient Instructions (Signed)

## 2016-08-02 NOTE — Progress Notes (Signed)
Pre visit review using our clinic review tool, if applicable. No additional management support is needed unless otherwise documented below in the visit note. 

## 2016-08-02 NOTE — Progress Notes (Signed)
Subjective:    Patient ID: Melinda Porter, female    DOB: [birth year], 46 y.o.   MRN: [id number]  HPI This is a 46 yo female who presents today for follow up of fibromyalgia. Is having to go back to work May 1. 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 Her short term disability has been denied and she needs to get a paycheck. She continues to take care of her grandmother who is dying of lung cancer. Unable to take lyrica or gabapentin- allergic reaction. Was on duloxetine previously without relief. Was seen by me last month and prescribed Amrix to help with spasm/sleep. Was not covered by insurance and PA denied. She would like to resume cyclobenzaprine. She sees Dr. Amil Amen of rheumatology and has seen neurosurgeon in past. According to the patient, she has been told she is "in a bad flare of fibromyalgia." She had MRI lumbar spine 04/24/2016 which showed stable disc protrusions at L3-4, L4-5, L5-S1. She has been contemplating breast reduction surgery in the future.   Has had mild dysuria x 1 week. No hematuria, no fever, no frequency, no urgency, no nausea/vomiting, no increased back pain. Reports good water intake.    Past Medical History:  Diagnosis Date  . Anxiety   . Arthritis   . Depression   . DM (diabetes mellitus) ([place])   . Endometriosis   . Hiatal hernia   . Hyperlipemia   . Hypertension   . Lupus   . PTSD (post-traumatic stress disorder)    Past Surgical History:  Procedure Laterality Date  . CESAREAN SECTION  1997  . OOPHORECTOMY Bilateral   . PARTIAL HYSTERECTOMY  07/2009  . TEMPOROMANDIBULAR JOINT SURGERY  2009   right side  . TONSILLECTOMY  2009   Family History  Problem Relation Age of Onset  . Colon polyps Maternal Grandmother   . Lung cancer Maternal Grandmother   . Liver disease Father     agent orange  . Diabetes Maternal Grandfather   . Heart disease Maternal Grandfather   . Hypertension Maternal Grandfather   . Colon cancer Maternal  Uncle     great uncle   Social History  Substance Use Topics  . Smoking status: Former Smoker    Types: Cigarettes    Quit date: 03/09/1999  . Smokeless tobacco: Never Used  . Alcohol use No      Review of Systems Per HPI    Objective:   Physical Exam Physical Exam  Vitals reviewed. Constitutional: Oriented to person, place, and time. Appears well-developed and well-nourished.  HENT:  Head: Normocephalic and atraumatic.  Eyes: Conjunctivae are normal.  Cardiovascular: Normal rate.   Pulmonary/Chest: Effort normal.   Neurological: Alert and oriented to person, place, and time.  Skin: Skin is warm and dry.  Psychiatric: Normal mood and affect. Behavior is normal. Judgment and thought content normal.      BP 122/84 (BP Location: Right Arm, Patient Position: Sitting, Cuff Size: Normal)   Pulse 83   Temp 97.5 F (36.4 C) (Oral)   Wt 240 lb 3.2 oz (109 kg)   LMP 06/23/2009   SpO2 97%   BMI 44.49 kg/m  Wt Readings from Last 3 Encounters:  08/02/16 240 lb 3.2 oz (109 kg)  06/14/16 238 lb 12.8 oz (108.3 kg)  04/13/16 241 lb 5 oz (109.5 kg)   Results for orders placed or performed in visit on 08/02/16  Urine culture  Result Value Ref Range   Organism  ID, Bacteria      Multiple organisms present,each less than 10,000 CFU/mL. These organisms,commonly found on external and internal genitalia,are considered colonizers. No further testing performed.   POCT urinalysis dipstick  Result Value Ref Range   Color, UA Yellow    Clarity, UA Clear    Glucose, UA Neg    Bilirubin, UA Neg    Ketones, UA Neg    Spec Grav, UA >=1.030 (A) 1.010 - 1.025   Blood, UA Neg    pH, UA 6.0 5.0 - 8.0   Protein, UA Neg    Urobilinogen, UA 0.2 0.2 or 1.0 E.U./dL   Nitrite, UA Neg    Leukocytes, UA Trace (A) Negative       Assessment & Plan:  1. Dysuria - POCT urinalysis dipstick  2. Cystitis - Provided written and verbal information regarding diagnosis and treatment. -  cephALEXin (KEFLEX) 500 MG capsule; Take 1 capsule (500 mg total) by mouth 3 (three) times daily.  Dispense: 21 capsule; Refill: 0 - Urine culture  3. Fibromyalgia - unable to get long acting cyclobenzaprine, will go back to regular - cyclobenzaprine (FLEXERIL) 10 MG tablet; Take 1 tablet (10 mg total) by mouth 3 (three) times daily as needed for muscle spasms.  Dispense: 60 tablet; Refill: 1 - discussed exploring additional therapies including acupuncture, PT, encouraged increased activity as tolerated  - follow up in 3 months Melinda Reamer, FNP-BC  [facility] [place] [place]  08/06/2016 9:40 PM

## 2016-08-03 ENCOUNTER — Other Ambulatory Visit: Payer: Self-pay | Admitting: Family Medicine

## 2016-08-04 LAB — URINE CULTURE

## 2016-08-07 ENCOUNTER — Encounter: Payer: Self-pay | Admitting: Family Medicine

## 2016-08-13 ENCOUNTER — Encounter: Payer: Self-pay | Admitting: Family Medicine

## 2016-08-18 ENCOUNTER — Other Ambulatory Visit: Payer: Self-pay | Admitting: Family Medicine

## 2016-08-18 DIAGNOSIS — F419 Anxiety disorder, unspecified: Secondary | ICD-10-CM

## 2016-08-19 ENCOUNTER — Ambulatory Visit: Payer: 59

## 2016-08-21 ENCOUNTER — Encounter: Payer: Self-pay | Admitting: Family Medicine

## 2016-08-21 ENCOUNTER — Other Ambulatory Visit: Payer: Self-pay | Admitting: Family Medicine

## 2016-08-21 ENCOUNTER — Telehealth: Payer: Self-pay | Admitting: Family Medicine

## 2016-08-21 DIAGNOSIS — F419 Anxiety disorder, unspecified: Secondary | ICD-10-CM

## 2016-08-21 MED ORDER — CLONAZEPAM 1 MG PO TABS: 1.0000 mg | ORAL_TABLET | Freq: Three times a day (TID) | ORAL | 0 refills | Status: DC | PRN

## 2016-08-21 NOTE — Telephone Encounter (Signed)
Okay to refill? 

## 2016-08-21 NOTE — Telephone Encounter (Signed)
Ordered and routed to you.

## 2016-08-21 NOTE — Telephone Encounter (Signed)
Phoned into pharmacy.

## 2016-08-21 NOTE — Telephone Encounter (Signed)
**  Remind patient they can make refill requests via MyChart**  Medication refill request (Name & Dosage): clonazePAM (KLONOPIN) 1 MG tablet   Preferred pharmacy (Name & Address): [facility] [place] [address] [phone] (Phone) [phone] (Fax)       Other comments (if applicable):

## 2016-08-21 NOTE — Progress Notes (Signed)
Phoned into pharmacy.

## 2016-08-30 ENCOUNTER — Ambulatory Visit: Payer: 59 | Admitting: Physician Assistant

## 2016-09-27 ENCOUNTER — Ambulatory Visit (INDEPENDENT_AMBULATORY_CARE_PROVIDER_SITE_OTHER): Payer: 59 | Admitting: Family Medicine

## 2016-09-27 VITALS — BP 124/80 | HR 79 | Temp 98.1°F | Wt 236.8 lb

## 2016-09-27 DIAGNOSIS — F419 Anxiety disorder, unspecified: Secondary | ICD-10-CM

## 2016-09-27 DIAGNOSIS — R531 Weakness: Secondary | ICD-10-CM

## 2016-09-27 DIAGNOSIS — E118 Type 2 diabetes mellitus with unspecified complications: Secondary | ICD-10-CM | POA: Diagnosis not present

## 2016-09-27 DIAGNOSIS — R5383 Other fatigue: Secondary | ICD-10-CM

## 2016-09-27 DIAGNOSIS — L4050 Arthropathic psoriasis, unspecified: Secondary | ICD-10-CM

## 2016-09-27 LAB — COMPREHENSIVE METABOLIC PANEL
ALBUMIN: 4.5 g/dL (ref 3.6–5.1)
ALK PHOS: 79 U/L (ref 33–115)
ALT: 27 U/L (ref 6–29)
AST: 20 U/L (ref 10–30)
BILIRUBIN TOTAL: 0.4 mg/dL (ref 0.2–1.2)
BUN: 15 mg/dL (ref 7–25)
CALCIUM: 9.4 mg/dL (ref 8.6–10.2)
CO2: 26 mmol/L (ref 20–31)
Chloride: 105 mmol/L (ref 98–110)
Creat: 0.96 mg/dL (ref 0.50–1.10)
Glucose, Bld: 102 mg/dL — ABNORMAL HIGH (ref 65–99)
Potassium: 3.4 mmol/L — ABNORMAL LOW (ref 3.5–5.3)
Sodium: 143 mmol/L (ref 135–146)
Total Protein: 6.6 g/dL (ref 6.1–8.1)

## 2016-09-27 LAB — CBC WITH DIFFERENTIAL/PLATELET
BASOS PCT: 2 %
Basophils Absolute: 188 cells/uL (ref 0–200)
EOS ABS: 470 {cells}/uL (ref 15–500)
Eosinophils Relative: 5 %
HEMATOCRIT: 40.9 % (ref 35.0–45.0)
Hemoglobin: 12.9 g/dL (ref 11.7–15.5)
LYMPHS PCT: 26 %
Lymphs Abs: 2444 cells/uL (ref 850–3900)
MCH: 23.8 pg — ABNORMAL LOW (ref 27.0–33.0)
MCHC: 31.5 g/dL — ABNORMAL LOW (ref 32.0–36.0)
MCV: 75.5 fL — AB (ref 80.0–100.0)
MONO ABS: 470 {cells}/uL (ref 200–950)
MPV: 10.6 fL (ref 7.5–12.5)
Monocytes Relative: 5 %
Neutro Abs: 5828 cells/uL (ref 1500–7800)
Neutrophils Relative %: 62 %
Platelets: 198 10*3/uL (ref 140–400)
RBC: 5.42 MIL/uL — ABNORMAL HIGH (ref 3.80–5.10)
RDW: 17.7 % — ABNORMAL HIGH (ref 11.0–15.0)
WBC: 9.4 10*3/uL (ref 3.8–10.8)

## 2016-09-27 LAB — TSH: TSH: 2.06 m[IU]/L

## 2016-09-27 MED ORDER — CLONAZEPAM 1 MG PO TABS: 1.0000 mg | ORAL_TABLET | Freq: Two times a day (BID) | ORAL | 1 refills | Status: DC | PRN

## 2016-09-27 NOTE — Progress Notes (Signed)
Subjective:    Patient ID: Melinda Porter, female    DOB: 1970-04-30, 46 y.o.   MRN: [id number]  HPI This is a 46 yo female who presents today with fatigue x 1 month. She's been back to work since the beginning of 2022-09-26. Her grandmother passed away 2016-10-07. Patient helped directly in her care for months. She has had difficulty sleeping, falls asleep easily but awakens at [place] to go back to sleep, but is exhausted.   Feels like vision has gotten worse, has appointment in 2 weeks with Dr. Herbert Deaner. Felt weak, lightheaded last weekend during grandmother's service. This resolved spontaneously. Not sure if she is having a flare of her autoimmune disorder. No fevers, no rash. Has been using CBD oil with improvement of pain.   Past Medical History:  Diagnosis Date  . Anxiety   . Arthritis   . Depression   . DM (diabetes mellitus) ([place])   . Endometriosis   . Hiatal hernia   . Hyperlipemia   . Hypertension   . Lupus   . PTSD (post-traumatic stress disorder)    Past Surgical History:  Procedure Laterality Date  . CESAREAN SECTION  1997  . OOPHORECTOMY Bilateral   . PARTIAL HYSTERECTOMY  07/2009  . TEMPOROMANDIBULAR JOINT SURGERY  2009   right side  . TONSILLECTOMY  2009   Family History  Problem Relation Age of Onset  . Colon polyps Maternal Grandmother   . Lung cancer Maternal Grandmother   . Liver disease Father        agent orange  . Diabetes Maternal Grandfather   . Heart disease Maternal Grandfather   . Hypertension Maternal Grandfather   . Colon cancer Maternal Uncle        great uncle   Social History  Substance Use Topics  . Smoking status: Former Smoker    Types: Cigarettes    Quit date: 03/09/1999  . Smokeless tobacco: Never Used  . Alcohol use No      Review of Systems Per HPI    Objective:   Physical Exam  Constitutional: She is oriented to person, place, and time. She appears well-developed and well-nourished. No distress.  Obese.   HENT:  Head:  Normocephalic and atraumatic.  Eyes: Conjunctivae are normal.  Cardiovascular: Normal rate.   Pulmonary/Chest: Effort normal.  Neurological: She is alert and oriented to person, place, and time.  Skin: Skin is warm and dry. She is not diaphoretic.  Psychiatric: She has a normal mood and affect. Her behavior is normal. Judgment and thought content normal.  Vitals reviewed.     BP 124/80 (BP Location: Right Arm, Patient Position: Sitting, Cuff Size: Normal)   Pulse 79   Temp 98.1 F (36.7 C) (Oral)   Wt 236 lb 12.8 oz (107.4 kg)   LMP 06/23/2009   SpO2 97%   BMI 43.86 kg/m  Wt Readings from Last 3 Encounters:  09/27/16 236 lb 12.8 oz (107.4 kg)  08/02/16 240 lb 3.2 oz (109 kg)  06/14/16 238 lb 12.8 oz (108.3 kg)       Assessment & Plan:  1. Fatigue, unspecified type - likely related to many months of taking care of her recently deceased grandmother on top of her chronic autoimmune disorder.  - CBC with Differential/Platelet - Comprehensive metabolic panel - TSH  2. Weakness - CBC with Differential/Platelet - Comprehensive metabolic panel - TSH  3. Psoriatic arthritis (HCC) - Sedimentation rate  4. Type 2 diabetes mellitus with complication, without  long-term current use of insulin (HCC) - Hemoglobin A1c  5. Anxiety - had allowed her to increase usage during grandmother's illness will plan to decrease in future - clonazePAM (KLONOPIN) 1 MG tablet; Take 1 tablet (1 mg total) by mouth 2 (two) times daily as needed. For anxiety  Dispense: 60 tablet; Refill: [place], FNP-BC  [facility] [place]  10/01/2016 5:20 PM

## 2016-09-28 LAB — SEDIMENTATION RATE: Sed Rate: 6 mm/hr (ref 0–20)

## 2016-09-28 LAB — HEMOGLOBIN A1C
HEMOGLOBIN A1C: 5.6 % (ref <5.7)
MEAN PLASMA GLUCOSE: 114 mg/dL

## 2016-09-30 ENCOUNTER — Encounter: Payer: Self-pay | Admitting: Family Medicine

## 2016-10-10 ENCOUNTER — Encounter: Payer: Self-pay | Admitting: Physician Assistant

## 2016-10-10 ENCOUNTER — Ambulatory Visit (INDEPENDENT_AMBULATORY_CARE_PROVIDER_SITE_OTHER): Payer: 59 | Admitting: Physician Assistant

## 2016-10-10 VITALS — BP 110/80 | HR 91 | Temp 98.3°F | Ht 64.0 in | Wt 234.5 lb

## 2016-10-10 DIAGNOSIS — S30861A Insect bite (nonvenomous) of abdominal wall, initial encounter: Secondary | ICD-10-CM | POA: Diagnosis not present

## 2016-10-10 DIAGNOSIS — W57XXXA Bitten or stung by nonvenomous insect and other nonvenomous arthropods, initial encounter: Secondary | ICD-10-CM

## 2016-10-10 DIAGNOSIS — L03311 Cellulitis of abdominal wall: Secondary | ICD-10-CM

## 2016-10-10 MED ORDER — DOXYCYCLINE HYCLATE 100 MG PO TABS: 100.0000 mg | ORAL_TABLET | Freq: Two times a day (BID) | ORAL | 0 refills | Status: DC

## 2016-10-10 NOTE — Patient Instructions (Signed)
It was great meeting you!  Start Doxycycline.   Tick Bite Information, Adult Ticks are insects that draw blood for food. Most ticks live in shrubs and grassy areas. They climb onto people and animals that brush against the leaves and grasses that they rest on. Then they bite, attaching themselves to the skin. Most ticks are harmless, but some ticks carry germs that can spread to a person through a bite and cause a disease. To reduce your risk of getting a disease from a tick bite, it is important to take steps to prevent tick bites. It is also important to check for ticks after being outdoors. If you find that a tick has attached to you, watch for symptoms of disease. How can I prevent tick bites? Take these steps to help prevent tick bites when you are outdoors in an area where ticks are found:  Use insect repellent that has DEET (20% or higher), picaridin, or IR3535 in it. Use it on: ? Skin that is showing. ? The top of your boots. ? Your pant legs. ? Your sleeve cuffs.  For repellent products that contain permethrin, follow product instructions. Use these products on: ? Clothing. ? Gear. ? Boots. ? Tents.  Wear protective clothing. Long sleeves and long pants offer the best protection from ticks.  Wear light-colored clothing so you can see ticks more easily.  Tuck your pant legs into your socks.  If you go walking on a trail, stay in the middle of the trail so your skin, hair, and clothing do not touch the bushes.  Avoid walking through areas with long grass.  Check for ticks on your clothing, hair, and skin often while you are outside, and check again before you go inside. Make sure to check the places that ticks attach themselves most often. These places include the scalp, neck, armpits, waist, groin, and joint areas. Ticks that carry a disease called Lyme disease have to be attached to the skin for 24-48 hours. Checking for ticks every day will lessen your risk of this and  other diseases.  When you come indoors, wash your clothes and take a shower or a bath right away. Dry your clothes in a dryer on high heat for at least 60 minutes. This will kill any ticks in your clothes.  What is the proper way to remove a tick? If you find a tick on your body, remove it as soon as possible. Removing a tick sooner rather than later can prevent germs from passing from the tick to your body. To remove a tick that is crawling on your skin but has not bitten:  Go outdoors and brush the tick off.  Remove the tick with tape or a lint roller.  To remove a tick that is attached to your skin:  Wash your hands.  If you have latex gloves, put them on.  Use tweezers, curved forceps, or a tick-removal tool to gently grasp the tick as close to your skin and the tick's head as possible.  Gently pull with steady, upward pressure until the tick lets go. When removing the tick: ? Take care to keep the tick's head attached to its body. ? Do not twist or jerk the tick. This can make the tick's head or mouth break off. ? Do not squeeze or crush the tick's body. This could force disease-carrying fluids from the tick into your body.  Do not try to remove a tick with heat, alcohol, petroleum jelly, or fingernail polish.  Using these methods can cause the tick to salivate and regurgitate into your bloodstream, increasing your risk of getting a disease. What should I do after removing a tick?  Clean the bite area with soap and water, rubbing alcohol, or an iodine scrub.  If an antiseptic cream or ointment is available, apply a small amount to the bite site.  Wash and disinfect any instruments that you used to remove the tick. How should I dispose of a tick? To dispose of a live tick, use one of these methods:  Place it in rubbing alcohol.  Place it in a sealed bag or container.  Wrap it tightly in tape.  Flush it down the toilet.  Contact a health care provider if:  You have  symptoms of a disease after a tick bite. Symptoms of a tick-borne disease can occur from moments after the tick bites to up to 30 days after a tick is removed. Symptoms include: ? Muscle, joint, or bone pain. ? Difficulty walking or moving your legs. ? Numbness in the legs. ? Paralysis. ? Red rash around the tick bite area that is shaped like a target or a "bull's-eye." ? Redness and swelling in the area of the tick bite. ? Fever. ? Repeated vomiting. ? Diarrhea. ? Weight loss. ? Tender, swollen lymph glands. ? Shortness of breath. ? Cough. ? Pain in the abdomen. ? Headache. ? Abnormal tiredness. ? A change in your level of consciousness. ? Confusion. Get help right away if:  You are not able to remove a tick.  A part of a tick breaks off and gets stuck in your skin.  Your symptoms get worse. Summary  Ticks may carry germs that can spread to a person through a bite and cause disease.  Wear protective clothing and use insect repellent to prevent tick bites. Follow product instructions.  If you find a tick on your body, remove it as soon as possible. If the tick is attached, do not try to remove with heat, alcohol, petroleum jelly, or fingernail polish.  Remove the attached tick using tweezers, curved forceps, or a tick-removal tool. Gently pull with steady, upward pressure until the tick lets go. Do not twist or jerk the tick. Do not squeeze or crush the tick's body.  If you have symptoms after being bitten by a tick, contact a health care provider. This information is not intended to replace advice given to you by your health care provider. Make sure you discuss any questions you have with your health care provider. Document Released: 03/29/2000 Document Revised: 01/12/2016 Document Reviewed: 01/12/2016 Elsevier Interactive Patient Education  [employer].

## 2016-10-10 NOTE — Progress Notes (Signed)
Melinda Porter is a 46 y.o. female here for tick bite, tick is still present on mid abdomen area.  I acted as a Education administrator for [employer], PA-C Anselmo Pickler, LPN  History of Present Illness:   Chief Complaint  Patient presents with  . Tick Removal    mid abdomen area, found this morning    Pt presented to office today for tick removal. Pt found tick on mid abdomen area this morning. Pt has not attempted to remove. Headaches x 3 days. No rash present or fever or chills. She has no prior hx of tick-borne illness, but does have lupus. She denies neck stiffness, unusual fatigue or joint pain.    Past Medical History:  Diagnosis Date  . Anxiety   . Arthritis   . Depression   . DM (diabetes mellitus) ([place])   . Endometriosis   . Hiatal hernia   . Hyperlipemia   . Hypertension   . Lupus   . PTSD (post-traumatic stress disorder)      Social History   Social History  . Marital status: Divorced    Spouse name: N/A  . Number of children: 1  . Years of education: N/A   Occupational History  . work from home [place] Topics  . Smoking status: Former Smoker    Types: Cigarettes    Quit date: 03/09/1999  . Smokeless tobacco: Never Used  . Alcohol use No  . Drug use: No  . Sexual activity: Yes    Partners: Male    Birth control/ protection: Surgical   Other Topics Concern  . Not on file   Social History Narrative  . No narrative on file    Past Surgical History:  Procedure Laterality Date  . CESAREAN SECTION  1997  . OOPHORECTOMY Bilateral   . PARTIAL HYSTERECTOMY  07/2009  . TEMPOROMANDIBULAR JOINT SURGERY  2009   right side  . TONSILLECTOMY  2009    Family History  Problem Relation Age of Onset  . Colon polyps Maternal Grandmother   . Lung cancer Maternal Grandmother   . Liver disease Father        agent orange  . Diabetes Maternal Grandfather   . Heart disease Maternal Grandfather   . Hypertension Maternal Grandfather   . Colon  cancer Maternal Uncle        great uncle    Allergies  Allergen Reactions  . Lyrica [Pregabalin] Shortness Of Breath  . Sulfa Antibiotics Rash  . Ciprofloxacin   . Gabapentin   . Nsaids Other (See Comments)    Liver problems- can't take tylenol, ibuprofen etc.  . Penicillins     Has patient had a PCN reaction causing immediate rash, facial/tongue/throat swelling, SOB or lightheadedness with hypotension: YES Has patient had a PCN reaction causing severe rash involving mucus membranes or skin necrosis: NO Has patient had a PCN reaction that required hospitalization NO Has patient had a PCN reaction occurring within the last 10 years: NO If all of the above answers are "NO", then may proceed with Cephalosporin use.  . Tolmetin     Other reaction(s): Other (See Comments) Liver problems- can't take tylenol, ibuprofen etc.  . Codeine Rash  . Morphine And Related Rash    Current Medications:   Current Outpatient Prescriptions:  .  benzonatate (TESSALON) 100 MG capsule, Take 1 capsule (100 mg total) by mouth 3 (three) times daily as needed for cough., Disp: 21 capsule, Rfl: 0 .  Cholecalciferol (D3-50) 50000 units capsule, Take 1 capsule (50,000 Units total) by mouth every 14 (fourteen) days., Disp: 6 capsule, Rfl: 1 .  clonazePAM (KLONOPIN) 1 MG tablet, Take 1 tablet (1 mg total) by mouth 2 (two) times daily as needed. For anxiety, Disp: 60 tablet, Rfl: 1 .  cyclobenzaprine (FLEXERIL) 10 MG tablet, Take 1 tablet (10 mg total) by mouth 3 (three) times daily as needed for muscle spasms., Disp: 60 tablet, Rfl: 1 .  esomeprazole (NEXIUM) 40 MG capsule, TAKE ONE CAPSULE BY MOUTH EVERY DAY, Disp: 30 capsule, Rfl: 5 .  hydrocortisone cream 1 %, Apply 1 application topically 2 (two) times daily as needed. For itching, Disp: , Rfl:  .  hydroxychloroquine (PLAQUENIL) 200 MG tablet, Take 200 mg by mouth 2 (two) times daily., Disp: , Rfl:  .  nystatin (MYCOSTATIN/NYSTOP) 100000 UNIT/GM POWD, For use  as recommended per package insert to affected areas (under breast), Disp: 15 g, Rfl: 0 .  SECUKINUMAB [place], Inject 300 mg into the skin every 30 (thirty) days., Disp: , Rfl:  .  sertraline (ZOLOFT) 100 MG tablet, Take 1 tablet (100 mg total) by mouth daily., Disp: 90 tablet, Rfl: 1 .  topiramate (TOPAMAX) 50 MG tablet, Take 50 mg by mouth 2 (two) times daily., Disp: , Rfl:  .  triamterene-hydrochlorothiazide (MAXZIDE-25) 37.5-25 MG tablet, Take 1 tablet by mouth daily., Disp: 90 tablet, Rfl: 1 .  Vitamin D, Ergocalciferol, (DRISDOL) 50000 units CAPS capsule, Take 50,000 Units by mouth every 7 (seven) days. Take on Mondays, Disp: , Rfl:  .  cephALEXin (KEFLEX) 500 MG capsule, Take 1 capsule (500 mg total) by mouth 3 (three) times daily. (Patient not taking: Reported on 10/10/2016), Disp: 21 capsule, Rfl: 0 .  doxycycline (VIBRA-TABS) 100 MG tablet, Take 1 tablet (100 mg total) by mouth 2 (two) times daily., Disp: 20 tablet, Rfl: 0   Review of Systems:   Review of Systems  Constitutional: Negative for chills, fever, malaise/fatigue and weight loss.  Musculoskeletal: Negative for myalgias.  Skin:       Redness around tick attachment   Neurological: Positive for headaches.    Vitals:   Vitals:   10/10/16 0855  BP: 110/80  Pulse: 91  Temp: 98.3 F (36.8 C)  TempSrc: Oral  SpO2: 98%  Weight: 234 lb 8 oz (106.4 kg)  Height: 5' 4"  (1.626 m)     Body mass index is 40.25 kg/m.  Physical Exam:   Physical Exam  Constitutional: She appears well-developed. She is cooperative.  Non-toxic appearance. She does not have a sickly appearance. She does not appear ill. No distress.  Cardiovascular: Normal rate, regular rhythm, S1 normal, S2 normal, normal heart sounds and normal pulses.   No LE edema  Pulmonary/Chest: Effort normal and breath sounds normal.  Neurological: She is alert.  Skin: Skin is warm and dry.  Pre - tick removal: Engorged tick located at approximately 2 o'clock of navel, 57m  circumferential erythema surrounding tick attachment site. Post - tick removal: 279mcircumferential erythema surrounding tick attachment site, no discharge from wound, no evidence of tick remaining in wound, bandage placed to site.  Nursing note and vitals reviewed.   Tick site was prepared with alcohol pads. Area was cleaned and tick was pulled out of skin using metal forceps. Patient tolerated procedure well. No evidence of tick remaining in skin after procedure. Bacitracin ointment applied to area with bandage.   Assessment and Plan:    MeMahaylaas seen today for  tick removal.  Diagnoses and all orders for this visit:  Tick bite of abdomen, initial encounter Tick was removed without complications, patient tolerated procedure well. Provided list of return precautions for worsening symptoms. Patient verbalized understanding.  Cellulitis of abdominal wall Doxycycline prescription provided to patient. Advised that she monitor area and follow-up if worsening redness, any discharge from area, fevers, or other concerns.  Other orders -     doxycycline (VIBRA-TABS) 100 MG tablet; Take 1 tablet (100 mg total) by mouth 2 (two) times daily.   . Reviewed expectations re: course of current medical issues. . Discussed self-management of symptoms. . Outlined signs and symptoms indicating need for more acute intervention. . Patient verbalized understanding and all questions were answered. . See orders for this visit as documented in the electronic medical record. . Patient received an After-Visit Summary.  CMA or LPN served as scribe during this visit. History, Physical, and Plan performed by medical provider. Documentation and orders reviewed and attested to.  Inda Coke, PA-C

## 2016-10-11 ENCOUNTER — Other Ambulatory Visit: Payer: Self-pay | Admitting: Family Medicine

## 2016-10-11 DIAGNOSIS — I10 Essential (primary) hypertension: Secondary | ICD-10-CM

## 2016-10-11 DIAGNOSIS — R5382 Chronic fatigue, unspecified: Secondary | ICD-10-CM

## 2016-10-11 DIAGNOSIS — Z6841 Body Mass Index (BMI) 40.0 and over, adult: Secondary | ICD-10-CM

## 2016-10-15 DIAGNOSIS — L930 Discoid lupus erythematosus: Secondary | ICD-10-CM | POA: Diagnosis not present

## 2016-10-15 DIAGNOSIS — Z79899 Other long term (current) drug therapy: Secondary | ICD-10-CM | POA: Diagnosis not present

## 2016-10-15 DIAGNOSIS — H04123 Dry eye syndrome of bilateral lacrimal glands: Secondary | ICD-10-CM | POA: Diagnosis not present

## 2016-10-24 ENCOUNTER — Encounter: Payer: Self-pay | Admitting: Family Medicine

## 2016-10-25 ENCOUNTER — Other Ambulatory Visit: Payer: Self-pay | Admitting: Family Medicine

## 2016-10-25 ENCOUNTER — Telehealth: Payer: Self-pay | Admitting: Family Medicine

## 2016-10-25 DIAGNOSIS — F419 Anxiety disorder, unspecified: Secondary | ICD-10-CM

## 2016-10-25 MED ORDER — CLONAZEPAM 1 MG PO TABS: 1.0000 mg | ORAL_TABLET | Freq: Three times a day (TID) | ORAL | 1 refills | Status: DC | PRN

## 2016-10-25 NOTE — Telephone Encounter (Signed)
Please advise 

## 2016-10-25 NOTE — Telephone Encounter (Signed)
**  Remind patient they can make refill requests via MyChart**  Medication refill request (Name & Dosage): clonazePAM (KLONOPIN) 1 MG tablet   Preferred pharmacy (Name & Address):  [facility] [place], [place] - [address] [phone] (Phone) [phone] (Fax)   Other comments (if applicable):  Change quantity from 60 pills to 90 pills.

## 2016-10-25 NOTE — Progress Notes (Signed)
Called into pharmacy

## 2016-11-13 ENCOUNTER — Other Ambulatory Visit: Payer: Self-pay | Admitting: Radiology

## 2016-11-13 ENCOUNTER — Other Ambulatory Visit: Payer: Self-pay | Admitting: Family Medicine

## 2016-11-13 DIAGNOSIS — Z1231 Encounter for screening mammogram for malignant neoplasm of breast: Secondary | ICD-10-CM

## 2016-11-14 ENCOUNTER — Telehealth: Payer: Self-pay | Admitting: Family Medicine

## 2016-11-14 NOTE — Telephone Encounter (Signed)
[facility] called to advise that the patient cancelled her appointment due to having a new job and the patient would call back to reschedule.

## 2016-11-15 NOTE — Telephone Encounter (Signed)
Noted  

## 2016-11-18 ENCOUNTER — Institutional Professional Consult (permissible substitution): Payer: Self-pay | Admitting: Neurology

## 2016-11-19 ENCOUNTER — Ambulatory Visit
Admission: RE | Admit: 2016-11-19 | Discharge: 2016-11-19 | Disposition: A | Discharge status: Home / Self Care | Payer: 59 | Source: Ambulatory Visit | Attending: Family Medicine | Admitting: Family Medicine

## 2016-11-19 DIAGNOSIS — Z1231 Encounter for screening mammogram for malignant neoplasm of breast: Secondary | ICD-10-CM

## 2016-11-30 DIAGNOSIS — Z23 Encounter for immunization: Secondary | ICD-10-CM | POA: Diagnosis not present

## 2016-12-04 ENCOUNTER — Encounter: Payer: Self-pay | Admitting: Family Medicine

## 2016-12-04 ENCOUNTER — Telehealth: Payer: Self-pay | Admitting: Family Medicine

## 2016-12-04 ENCOUNTER — Ambulatory Visit (INDEPENDENT_AMBULATORY_CARE_PROVIDER_SITE_OTHER): Payer: 59 | Admitting: Family Medicine

## 2016-12-04 VITALS — BP 118/78 | HR 75 | Temp 97.8°F | Wt 229.0 lb

## 2016-12-04 DIAGNOSIS — E876 Hypokalemia: Secondary | ICD-10-CM | POA: Diagnosis not present

## 2016-12-04 DIAGNOSIS — M797 Fibromyalgia: Secondary | ICD-10-CM

## 2016-12-04 LAB — BASIC METABOLIC PANEL
BUN: 20 mg/dL (ref 6–23)
CO2: 25 meq/L (ref 19–32)
Calcium: 9.4 mg/dL (ref 8.4–10.5)
Chloride: 108 mEq/L (ref 96–112)
Creatinine, Ser: 0.92 mg/dL (ref 0.40–1.20)
GFR: 70.36 mL/min (ref >60.00)
GLUCOSE: 110 mg/dL — AB (ref 70–99)
POTASSIUM: 3.6 meq/L (ref 3.5–5.1)
SODIUM: 143 meq/L (ref 135–145)

## 2016-12-04 NOTE — Progress Notes (Signed)
Error on previous letter. Letter rewritten.

## 2016-12-04 NOTE — Patient Instructions (Addendum)
Keep up the good work with your diet and weight loss  Try to incorporated some stretching into your daily activities- Youtube- yoga with Adriane for beginner yoga  Ibuprofen 200 mg- 2 tablets twice a day

## 2016-12-04 NOTE — Telephone Encounter (Signed)
Letter sent via [facility] called and spoke with pt she is aware. Nothing further needed at this time.

## 2016-12-04 NOTE — Telephone Encounter (Signed)
Patient called in reference to e-mail she sent via [facility] chart. Patient stated the note for work Melinda Porter wrote had the wrong date. Patient stated letter was dated 06/06/16 instead of 06/06/17. Patient stated the updated letter can be sent to her e-mail [email or web address]. Please call patient with any questions.

## 2016-12-04 NOTE — Progress Notes (Signed)
Subjective:    Patient ID: Melinda Porter, female    DOB: [birth year], 46 y.o.   MRN: [id number]  HPI This is a 46 yo female who presents today with continued chronic joint pain, brain fog. Feels like concentration is off, she is very fatigued. More pain and swelling in left arm and hand. Has noticed more sensitivity to air and light touch sensation. Has been on Keto diet and according to patient, she has lost 25 pounds. Feels better with weight loss. All she wants to do is sleep. Sleeping 9-10 hours a night. She has exhausted her FMLA for the year and she feels that she is unable to continue to work full time. She works full time from home, job is stressful. She walks up and down driveway and takes her dogs out 5-6 times a day. Everything is exhausting her. She has an appointment with Dr. Amil Amen in a month. She has been seeing Dr. Amil Amen and other rheumatologists for several years, has diagnosis of Sjogren's, lupus, psoriasis and fibromyalgia. She takes cosentyx and plaquenil. She reports that physically feeling bad makes her feel more depressed. She is somewhat accustomed to flares and realizes they are self-limiting.   Past Medical History:  Diagnosis Date  . Anxiety   . Arthritis   . Depression   . DM (diabetes mellitus) ([place])   . Endometriosis   . Hiatal hernia   . Hyperlipemia   . Hypertension   . Lupus   . PTSD (post-traumatic stress disorder)    Past Surgical History:  Procedure Laterality Date  . CESAREAN SECTION  1997  . OOPHORECTOMY Bilateral   . PARTIAL HYSTERECTOMY  07/2009  . TEMPOROMANDIBULAR JOINT SURGERY  2009   right side  . TONSILLECTOMY  2009   Family History  Problem Relation Age of Onset  . Colon polyps Maternal Grandmother   . Lung cancer Maternal Grandmother   . Liver disease Father        agent orange  . Diabetes Maternal Grandfather   . Heart disease Maternal Grandfather   . Hypertension Maternal Grandfather   . Colon cancer Maternal Uncle    great uncle  . Breast cancer Neg Hx    Social History  Substance Use Topics  . Smoking status: Former Smoker    Types: Cigarettes    Quit date: 03/09/1999  . Smokeless tobacco: Never Used  . Alcohol use No      Review of Systems Per HPI    Objective:   Physical Exam  Constitutional: She is oriented to person, place, and time. She appears well-developed and well-nourished. No distress.  Obese.  HENT:  Head: Normocephalic and atraumatic.  Eyes: Conjunctivae are normal.  Cardiovascular: Normal rate.   Pulmonary/Chest: Effort normal.  Neurological: She is alert and oriented to person, place, and time.  Skin: Skin is warm and dry. She is not diaphoretic.  Psychiatric: She has a normal mood and affect. Her behavior is normal. Judgment and thought content normal.  Vitals reviewed.     BP 118/78 (BP Location: Right Arm, Patient Position: Sitting, Cuff Size: Normal)   Pulse 75   Temp 97.8 F (36.6 C) (Oral)   Wt 229 lb (103.9 kg)   LMP 06/23/2009   SpO2 98%   BMI 39.31 kg/m  Wt Readings from Last 3 Encounters:  12/04/16 229 lb (103.9 kg)  10/10/16 234 lb 8 oz (106.4 kg)  09/27/16 236 lb 12.8 oz (107.4 kg)       Assessment &  Plan:  1. Fibromyalgia - discussed increasing physical activity, adding stretching/yoga, continued weight loss - per patient request, letter of recommendation to decrease work hours provided - follow up with rheumatology as scheduled - follow up with me in about 4 months  2. Hypokalemia - Basic metabolic panel  Clarene Reamer, FNP-BC  [facility] [place], [place]  12/04/2016 10:07 AM

## 2016-12-25 ENCOUNTER — Telehealth: Payer: Self-pay | Admitting: Family Medicine

## 2016-12-25 NOTE — Telephone Encounter (Signed)
MEDICATION:  sertraline (ZOLOFT) 100 MG tablet clonazePAM (KLONOPIN) 1 MG tablet triamterene-hydrochlorothiazide (MAXZIDE-25) 37.5-25 MG tablet Vitamin D, Ergocalciferol, (DRISDOL) 50000 units CAPS capsule  PHARMACY:   [id number] [place] [place] - [address] [phone] (Phone) [phone] (Fax)     IS THIS A 90 DAY SUPPLY : Y on all   IS PATIENT OUT OF MEDICATION: N/A  IF NOT; HOW MUCH IS LEFT:   LAST APPOINTMENT DATE: 12/04/16  NEXT APPOINTMENT DATE: N/A  OTHER COMMENTS:    **Let patient know to contact pharmacy at the end of the day to make sure medication is ready. **  ** Please notify patient to allow 48-72 hours to process**  **Encourage patient to contact the pharmacy for refills or they can request refills through [facility]**

## 2016-12-26 ENCOUNTER — Encounter: Payer: Self-pay | Admitting: Family Medicine

## 2016-12-26 NOTE — Telephone Encounter (Signed)
Patient called in reference to prescriptions needed below. Patient would like to pick these up today. Please advise.

## 2016-12-27 ENCOUNTER — Other Ambulatory Visit: Payer: Self-pay | Admitting: Family Medicine

## 2016-12-27 DIAGNOSIS — F419 Anxiety disorder, unspecified: Secondary | ICD-10-CM

## 2016-12-27 MED ORDER — TRIAMTERENE-HCTZ 37.5-25 MG PO TABS: 1.0000 | ORAL_TABLET | Freq: Every day | ORAL | 1 refills | Status: DC

## 2016-12-27 MED ORDER — CLONAZEPAM 1 MG PO TABS: 1.0000 mg | ORAL_TABLET | Freq: Three times a day (TID) | ORAL | 0 refills | Status: DC | PRN

## 2016-12-27 MED ORDER — CHOLECALCIFEROL 25 MCG (1000 UT) PO CAPS: 1000.0000 [IU] | ORAL_CAPSULE | Freq: Every day | ORAL | 3 refills | Status: DC

## 2016-12-27 MED ORDER — SERTRALINE HCL 100 MG PO TABS: 100.0000 mg | ORAL_TABLET | Freq: Every day | ORAL | 1 refills | Status: DC

## 2016-12-27 NOTE — Progress Notes (Signed)
Medication sent and called into pharmacy. Patient is aware nothing further needed at this time.

## 2017-01-05 ENCOUNTER — Observation Stay (HOSPITAL_BASED_OUTPATIENT_CLINIC_OR_DEPARTMENT_OTHER)
Admission: EM | Admit: 2017-01-05 | Discharge: 2017-01-07 | DRG: 394 | Disposition: A | Discharge status: Home / Self Care | Payer: 59 | Attending: Internal Medicine | Admitting: Internal Medicine

## 2017-01-05 ENCOUNTER — Emergency Department (HOSPITAL_BASED_OUTPATIENT_CLINIC_OR_DEPARTMENT_OTHER): Payer: 59

## 2017-01-05 ENCOUNTER — Encounter (HOSPITAL_BASED_OUTPATIENT_CLINIC_OR_DEPARTMENT_OTHER): Payer: Self-pay

## 2017-01-05 DIAGNOSIS — F419 Anxiety disorder, unspecified: Secondary | ICD-10-CM | POA: Diagnosis not present

## 2017-01-05 DIAGNOSIS — I10 Essential (primary) hypertension: Secondary | ICD-10-CM | POA: Insufficient documentation

## 2017-01-05 DIAGNOSIS — K529 Noninfective gastroenteritis and colitis, unspecified: Principal | ICD-10-CM | POA: Insufficient documentation

## 2017-01-05 DIAGNOSIS — D696 Thrombocytopenia, unspecified: Secondary | ICD-10-CM | POA: Insufficient documentation

## 2017-01-05 DIAGNOSIS — R197 Diarrhea, unspecified: Secondary | ICD-10-CM | POA: Diagnosis not present

## 2017-01-05 DIAGNOSIS — E876 Hypokalemia: Secondary | ICD-10-CM | POA: Insufficient documentation

## 2017-01-05 DIAGNOSIS — M3500 Sicca syndrome, unspecified: Secondary | ICD-10-CM | POA: Insufficient documentation

## 2017-01-05 DIAGNOSIS — Z79899 Other long term (current) drug therapy: Secondary | ICD-10-CM | POA: Diagnosis not present

## 2017-01-05 DIAGNOSIS — E119 Type 2 diabetes mellitus without complications: Secondary | ICD-10-CM

## 2017-01-05 DIAGNOSIS — F329 Major depressive disorder, single episode, unspecified: Secondary | ICD-10-CM | POA: Diagnosis not present

## 2017-01-05 DIAGNOSIS — L4050 Arthropathic psoriasis, unspecified: Secondary | ICD-10-CM | POA: Diagnosis not present

## 2017-01-05 DIAGNOSIS — M329 Systemic lupus erythematosus, unspecified: Secondary | ICD-10-CM | POA: Diagnosis not present

## 2017-01-05 DIAGNOSIS — K5190 Ulcerative colitis, unspecified, without complications: Secondary | ICD-10-CM | POA: Diagnosis present

## 2017-01-05 DIAGNOSIS — K922 Gastrointestinal hemorrhage, unspecified: Secondary | ICD-10-CM | POA: Diagnosis not present

## 2017-01-05 DIAGNOSIS — Z6838 Body mass index (BMI) 38.0-38.9, adult: Secondary | ICD-10-CM | POA: Insufficient documentation

## 2017-01-05 DIAGNOSIS — R109 Unspecified abdominal pain: Secondary | ICD-10-CM | POA: Diagnosis not present

## 2017-01-05 DIAGNOSIS — E669 Obesity, unspecified: Secondary | ICD-10-CM | POA: Insufficient documentation

## 2017-01-05 DIAGNOSIS — Z87891 Personal history of nicotine dependence: Secondary | ICD-10-CM | POA: Diagnosis not present

## 2017-01-05 LAB — COMPREHENSIVE METABOLIC PANEL
ALBUMIN: 4.1 g/dL (ref 3.5–5.0)
ALK PHOS: 65 U/L (ref 38–126)
ALT: 18 U/L (ref 14–54)
ANION GAP: 10 (ref 5–15)
AST: 22 U/L (ref 15–41)
BILIRUBIN TOTAL: 0.5 mg/dL (ref 0.3–1.2)
BUN: 17 mg/dL (ref 6–20)
CALCIUM: 9.2 mg/dL (ref 8.9–10.3)
CO2: 24 mmol/L (ref 22–32)
Chloride: 106 mmol/L (ref 101–111)
Creatinine, Ser: 0.71 mg/dL (ref 0.44–1.00)
GFR calc non Af Amer: >60 mL/min (ref >60)
Glucose, Bld: 129 mg/dL — ABNORMAL HIGH (ref 65–99)
POTASSIUM: 2.7 mmol/L — AB (ref 3.5–5.1)
Sodium: 140 mmol/L (ref 135–145)
TOTAL PROTEIN: 6.8 g/dL (ref 6.5–8.1)

## 2017-01-05 LAB — URINALYSIS, ROUTINE W REFLEX MICROSCOPIC
BILIRUBIN URINE: NEGATIVE
Glucose, UA: NEGATIVE mg/dL
Hgb urine dipstick: NEGATIVE
Ketones, ur: NEGATIVE mg/dL
Leukocytes, UA: NEGATIVE
NITRITE: NEGATIVE
Protein, ur: NEGATIVE mg/dL
pH: 5.5 (ref 5.0–8.0)

## 2017-01-05 LAB — CBC
HEMATOCRIT: 37.4 % (ref 36.0–46.0)
Hemoglobin: 11.9 g/dL — ABNORMAL LOW (ref 12.0–15.0)
MCH: 24.2 pg — ABNORMAL LOW (ref 26.0–34.0)
MCHC: 31.8 g/dL (ref 30.0–36.0)
MCV: 76.2 fL — AB (ref 78.0–100.0)
Platelets: 120 10*3/uL — ABNORMAL LOW (ref 150–400)
RBC: 4.91 MIL/uL (ref 3.87–5.11)
RDW: 16.1 % — ABNORMAL HIGH (ref 11.5–15.5)
WBC: 7.5 10*3/uL (ref 4.0–10.5)

## 2017-01-05 LAB — CBC WITH DIFFERENTIAL/PLATELET
BASOS PCT: 1 %
Basophils Absolute: 0.1 10*3/uL (ref 0.0–0.1)
Eosinophils Absolute: 0.4 10*3/uL (ref 0.0–0.7)
Eosinophils Relative: 4 %
HEMATOCRIT: 38.9 % (ref 36.0–46.0)
Hemoglobin: 12.7 g/dL (ref 12.0–15.0)
LYMPHS ABS: 1.7 10*3/uL (ref 0.7–4.0)
Lymphocytes Relative: 20 %
MCH: 24.5 pg — AB (ref 26.0–34.0)
MCHC: 32.6 g/dL (ref 30.0–36.0)
MCV: 75.1 fL — ABNORMAL LOW (ref 78.0–100.0)
MONOS PCT: 5 %
Monocytes Absolute: 0.4 10*3/uL (ref 0.1–1.0)
NEUTROS ABS: 5.9 10*3/uL (ref 1.7–7.7)
NEUTROS PCT: 70 %
Platelets: 158 10*3/uL (ref 150–400)
RBC: 5.18 MIL/uL — AB (ref 3.87–5.11)
RDW: 16.5 % — ABNORMAL HIGH (ref 11.5–15.5)
WBC: 8.5 10*3/uL (ref 4.0–10.5)

## 2017-01-05 LAB — OCCULT BLOOD X 1 CARD TO LAB, STOOL: FECAL OCCULT BLD: POSITIVE — AB

## 2017-01-05 LAB — I-STAT CG4 LACTIC ACID, ED: LACTIC ACID, VENOUS: 0.6 mmol/L (ref 0.5–1.9)

## 2017-01-05 LAB — LIPASE, BLOOD: Lipase: 42 U/L (ref 11–51)

## 2017-01-05 LAB — TYPE AND SCREEN
ABO/RH(D): O POS
Antibody Screen: NEGATIVE

## 2017-01-05 LAB — LACTIC ACID, PLASMA: LACTIC ACID, VENOUS: 1.2 mmol/L (ref 0.5–1.9)

## 2017-01-05 LAB — GLUCOSE, CAPILLARY: Glucose-Capillary: 89 mg/dL (ref 65–99)

## 2017-01-05 MED ORDER — SODIUM CHLORIDE 0.9 % IV BOLUS (SEPSIS)
1000.0000 mL | Freq: Once | INTRAVENOUS | Status: AC
Start: 2017-01-05 — End: 2017-01-05
  Administered 2017-01-05: 1000 mL via INTRAVENOUS

## 2017-01-05 MED ORDER — GI COCKTAIL ~~LOC~~
30.0000 mL | Freq: Once | ORAL | Status: AC
  Administered 2017-01-05: 30 mL via ORAL
  Filled 2017-01-05: qty 30

## 2017-01-05 MED ORDER — ONDANSETRON HCL 4 MG/2ML IJ SOLN: 4.0000 mg | Freq: Four times a day (QID) | INTRAMUSCULAR | Status: DC | PRN

## 2017-01-05 MED ORDER — CLONAZEPAM 1 MG PO TABS: 1.0000 mg | ORAL_TABLET | Freq: Three times a day (TID) | ORAL | Status: DC | PRN

## 2017-01-05 MED ORDER — CIPROFLOXACIN IN D5W 400 MG/200ML IV SOLN
400.0000 mg | Freq: Once | INTRAVENOUS | Status: AC
  Administered 2017-01-05: 400 mg via INTRAVENOUS
  Filled 2017-01-05: qty 200

## 2017-01-05 MED ORDER — PANTOPRAZOLE SODIUM 40 MG IV SOLR
40.0000 mg | Freq: Once | INTRAVENOUS | Status: AC
  Administered 2017-01-05: 40 mg via INTRAVENOUS
  Filled 2017-01-05: qty 40

## 2017-01-05 MED ORDER — ACETAMINOPHEN 650 MG RE SUPP: 650.0000 mg | Freq: Four times a day (QID) | RECTAL | Status: DC | PRN

## 2017-01-05 MED ORDER — SODIUM CHLORIDE 0.9 % IV SOLN
INTRAVENOUS | Status: DC
  Administered 2017-01-05: 20:00:00 via INTRAVENOUS

## 2017-01-05 MED ORDER — SODIUM CHLORIDE 0.9 % IV BOLUS (SEPSIS)
1000.0000 mL | Freq: Once | INTRAVENOUS | Status: AC
  Administered 2017-01-05: 1000 mL via INTRAVENOUS

## 2017-01-05 MED ORDER — SERTRALINE HCL 100 MG PO TABS
100.0000 mg | ORAL_TABLET | Freq: Every day | ORAL | Status: DC
  Administered 2017-01-06 – 2017-01-07 (×2): 100 mg via ORAL
  Filled 2017-01-05 (×2): qty 1

## 2017-01-05 MED ORDER — METRONIDAZOLE IN NACL 5-0.79 MG/ML-% IV SOLN
500.0000 mg | Freq: Once | INTRAVENOUS | Status: AC
  Administered 2017-01-05: 500 mg via INTRAVENOUS

## 2017-01-05 MED ORDER — ACETAMINOPHEN 325 MG PO TABS: 650.0000 mg | ORAL_TABLET | Freq: Four times a day (QID) | ORAL | Status: DC | PRN

## 2017-01-05 MED ORDER — POTASSIUM CHLORIDE CRYS ER 20 MEQ PO TBCR
40.0000 meq | EXTENDED_RELEASE_TABLET | Freq: Once | ORAL | Status: AC
  Administered 2017-01-05: 40 meq via ORAL
  Filled 2017-01-05: qty 2

## 2017-01-05 MED ORDER — FAMOTIDINE IN NACL 20-0.9 MG/50ML-% IV SOLN
20.0000 mg | Freq: Once | INTRAVENOUS | Status: AC
  Administered 2017-01-05: 20 mg via INTRAVENOUS
  Filled 2017-01-05: qty 50

## 2017-01-05 MED ORDER — METOCLOPRAMIDE HCL 5 MG/ML IJ SOLN
10.0000 mg | Freq: Once | INTRAMUSCULAR | Status: AC
  Administered 2017-01-05: 10 mg via INTRAVENOUS
  Filled 2017-01-05: qty 2

## 2017-01-05 MED ORDER — METRONIDAZOLE IN NACL 5-0.79 MG/ML-% IV SOLN
500.0000 mg | Freq: Three times a day (TID) | INTRAVENOUS | Status: DC
  Administered 2017-01-06 – 2017-01-07 (×5): 500 mg via INTRAVENOUS
  Filled 2017-01-05 (×6): qty 100

## 2017-01-05 MED ORDER — HYDROXYCHLOROQUINE SULFATE 200 MG PO TABS
200.0000 mg | ORAL_TABLET | Freq: Two times a day (BID) | ORAL | Status: DC
  Administered 2017-01-06 – 2017-01-07 (×3): 200 mg via ORAL
  Filled 2017-01-05 (×3): qty 1

## 2017-01-05 MED ORDER — ONDANSETRON HCL 4 MG PO TABS: 4.0000 mg | ORAL_TABLET | Freq: Four times a day (QID) | ORAL | Status: DC | PRN

## 2017-01-05 MED ORDER — DICYCLOMINE HCL 10 MG/ML IM SOLN
20.0000 mg | Freq: Once | INTRAMUSCULAR | Status: AC
  Administered 2017-01-05: 20 mg via INTRAMUSCULAR
  Filled 2017-01-05: qty 2

## 2017-01-05 MED ORDER — TOPIRAMATE 25 MG PO TABS
50.0000 mg | ORAL_TABLET | Freq: Two times a day (BID) | ORAL | Status: DC
  Administered 2017-01-05 – 2017-01-07 (×4): 50 mg via ORAL
  Filled 2017-01-05 (×4): qty 2

## 2017-01-05 MED ORDER — IOPAMIDOL (ISOVUE-300) INJECTION 61%
100.0000 mL | Freq: Once | INTRAVENOUS | Status: AC | PRN
  Administered 2017-01-05: 100 mL via INTRAVENOUS

## 2017-01-05 MED ORDER — POTASSIUM CHLORIDE 10 MEQ/100ML IV SOLN
10.0000 meq | Freq: Once | INTRAVENOUS | Status: AC
  Administered 2017-01-05: 10 meq via INTRAVENOUS
  Filled 2017-01-05: qty 100

## 2017-01-05 MED ORDER — CIPROFLOXACIN IN D5W 400 MG/200ML IV SOLN: 400.0000 mg | Freq: Two times a day (BID) | INTRAVENOUS | Status: DC

## 2017-01-05 MED ORDER — CIPROFLOXACIN IN D5W 400 MG/200ML IV SOLN
400.0000 mg | Freq: Two times a day (BID) | INTRAVENOUS | Status: DC
  Administered 2017-01-06 – 2017-01-07 (×3): 400 mg via INTRAVENOUS
  Filled 2017-01-05 (×3): qty 200

## 2017-01-05 NOTE — ED Triage Notes (Signed)
Pt reports diffuse upper abdominal pain since yesterday. ALso sts rectal bleeding since this morning. REports nausea and no vomiting.

## 2017-01-05 NOTE — Progress Notes (Signed)
Called by EDPA  from  [facility] 46/F with DM, HTN, lupus, presented to ED with Abd pain, BRBPR since last pm, ate chikfila In ED, tenderness, hematochezia, hb 12.7, K 2.7, getting PO and IV Ct abd with acute colitis [facility] consulted by EDP-will see in am, he is pts gastroenterologist Getting IVF, Abx Vitals stable, accepted to Med Surg bed at [place], MD

## 2017-01-05 NOTE — ED Notes (Signed)
ED MD informed of K+ 2.7

## 2017-01-05 NOTE — ED Notes (Signed)
Report given to [facility]. ETA approximately 20 minutes.

## 2017-01-05 NOTE — ED Notes (Signed)
Supervised PA for rectal exam.

## 2017-01-05 NOTE — Progress Notes (Signed)
Pharmacy Antibiotic Note  Melinda Porter is a 46 y.o. female admitted on 01/05/2017 with suspected intra-abdominal infection, abdominal pain, and rectal bleeding after eating at [place] on 9/22.  Pharmacy has been consulted for Cipro dosing.  Noted cipro allergy, but only nausea per patient report.  OK for cipro per patient and MD. Tm 99.2 WBC 8.5 SCr 0.71  Plan: Cipro 430m IV q12h Metronidazole 5022mIV q8h  Height: 5' 4"  (162.6 cm) Weight: 224 lb (101.6 kg) IBW/kg (Calculated) : 54.7  Temp (24hrs), Avg:98.7 F (37.1 C), Min:98.1 F (36.7 C), Max:99.1 F (37.3 C)   Recent Labs Lab 01/05/17 1305 01/05/17 1709  WBC 8.5  --   CREATININE 0.71  --   LATICACIDVEN  --  0.60    Estimated Creatinine Clearance: 104.1 mL/min (by C-G formula based on SCr of 0.71 mg/dL).    Allergies  Allergen Reactions  . Lyrica [Pregabalin] Shortness Of Breath  . Sulfa Antibiotics Rash  . Ciprofloxacin Nausea Only  . Gabapentin     Stroke like symptoms  . Nsaids Other (See Comments)    Liver problems- can't take tylenol, ibuprofen etc.  . Tolmetin     Other reaction(s): Other (See Comments) Liver problems- can't take tylenol, ibuprofen etc.  . Codeine Rash  . Humira [Adalimumab] Rash    Worsening of Lupus rash.  . Morphine And Related Rash    "Wired up"  . Penicillins Rash    Has patient had a PCN reaction causing immediate rash, facial/tongue/throat swelling, SOB or lightheadedness with hypotension: NO Has patient had a PCN reaction causing severe rash involving mucus membranes or skin necrosis: NO Has patient had a PCN reaction that required hospitalization NO Has patient had a PCN reaction occurring within the last 10 years: NO If all of the above answers are "NO", then may proceed with Cephalosporin use.    Antimicrobials this admission: 9/23 Cipro >>  Dose adjustments this admission:   Microbiology results: 9/23 GI Panel: 9/23 Cdiff:   Thank you for allowing pharmacy  to be a part of this patient's care.  ChGretta ArabharmD, BCPS Pager [phone]/23/2018 7:43 PM

## 2017-01-05 NOTE — ED Notes (Signed)
Attempted to call for report 2 times but RN is not available.

## 2017-01-05 NOTE — H&P (Signed)
History and Physical    Clary Boulais Hansell XBJ:[id number] DOB: 08-05-1970 DOA: 01/05/2017  PCP: Elby Beck, FNP  Patient coming from: Home.  Chief Complaint: Abdominal pain with bloody diarrhea.  HPI: Melinda Porter is a 46 y.o. female with history of lupus, psoriatic arthritis, Sjogren's syndrome, hypertension presents to the ER after patient had bloody bowel movement. Patient states last night patient had multiple bouts of diarrhea a few hours after eating chicken fillet sandwich. This morning patient started experiencing bloody bowel movement. There were at least 3 episodes. Denies any vomiting. Patient did take antibiotics 2 months ago for tick bite.  ED Course: In the ER CT scan shows descending colitis. Hemoglobin mildly decreased from baseline. On-call gastroenterologist for [place] was consulted. Patient was started on Cipro and Flagyl and stool studies ordered admitted for further management. On exam patient has mild left lower quadrant tenderness. Patient states abdominal pain is mostly crampy in nature.  Review of Systems: As per HPI, rest all negative.   Past Medical History:  Diagnosis Date  . Anxiety   . Arthritis   . Depression   . Endometriosis   . Hiatal hernia   . Hyperlipemia   . Hypertension   . Lupus   . PTSD (post-traumatic stress disorder)     Past Surgical History:  Procedure Laterality Date  . CESAREAN SECTION  1997  . OOPHORECTOMY Bilateral   . PARTIAL HYSTERECTOMY  07/2009  . TEMPOROMANDIBULAR JOINT SURGERY  2009   right side  . TONSILLECTOMY  2009     reports that she quit smoking about 17 years ago. Her smoking use included Cigarettes. She has never used smokeless tobacco. She reports that she does not drink alcohol or use drugs.  Allergies  Allergen Reactions  . Lyrica [Pregabalin] Shortness Of Breath  . Sulfa Antibiotics Rash  . Ciprofloxacin Nausea Only  . Gabapentin     Stroke like symptoms  . Nsaids Other (See Comments)    Liver  problems- can't take tylenol, ibuprofen etc.  . Tolmetin     Other reaction(s): Other (See Comments) Liver problems- can't take tylenol, ibuprofen etc.  . Codeine Rash  . Humira [Adalimumab] Rash    Worsening of Lupus rash.  . Morphine And Related Rash    "Wired up"  . Penicillins Rash    Has patient had a PCN reaction causing immediate rash, facial/tongue/throat swelling, SOB or lightheadedness with hypotension: NO Has patient had a PCN reaction causing severe rash involving mucus membranes or skin necrosis: NO Has patient had a PCN reaction that required hospitalization NO Has patient had a PCN reaction occurring within the last 10 years: NO If all of the above answers are "NO", then may proceed with Cephalosporin use.    Family History  Problem Relation Age of Onset  . Colon polyps Maternal Grandmother   . Lung cancer Maternal Grandmother   . Liver disease Father        agent orange  . Diabetes Maternal Grandfather   . Heart disease Maternal Grandfather   . Hypertension Maternal Grandfather   . Colon cancer Maternal Uncle        great uncle  . Breast cancer Neg Hx     Prior to Admission medications   Medication Sig Start Date End Date Taking? Authorizing Provider  Cholecalciferol 1000 units capsule Take 1 capsule (1,000 Units total) by mouth daily. 12/27/16  Yes Elby Beck, FNP  clonazePAM (KLONOPIN) 1 MG tablet Take 1 tablet (1  mg total) by mouth 3 (three) times daily as needed. For anxiety 12/27/16  Yes Elby Beck, FNP  COSENTYX 300 DOSE 150 MG/ML SOSY  11/27/16  Yes [provider]  cyclobenzaprine (FLEXERIL) 10 MG tablet Take 1 tablet (10 mg total) by mouth 3 (three) times daily as needed for muscle spasms. 08/02/16  Yes Elby Beck, FNP  esomeprazole (NEXIUM) 40 MG capsule TAKE ONE CAPSULE BY MOUTH EVERY DAY 07/16/16  Yes Elby Beck, FNP  hydrocortisone cream 1 % Apply 1 application topically 2 (two) times daily as needed. For itching    Yes [provider]  hydroxychloroquine (PLAQUENIL) 200 MG tablet Take 200 mg by mouth 2 (two) times daily.   Yes [provider]  sertraline (ZOLOFT) 100 MG tablet Take 1 tablet (100 mg total) by mouth daily. 12/27/16  Yes Elby Beck, FNP  topiramate (TOPAMAX) 50 MG tablet Take 50 mg by mouth 2 (two) times daily.   Yes [provider]  triamterene-hydrochlorothiazide (MAXZIDE-25) 37.5-25 MG tablet Take 1 tablet by mouth daily. 12/27/16  Yes Elby Beck, FNP  doxycycline (VIBRA-TABS) 100 MG tablet Take 1 tablet (100 mg total) by mouth 2 (two) times daily. Patient not taking: Reported on 01/05/2017 10/10/16   Inda Coke, PA  nystatin (MYCOSTATIN/NYSTOP) 100000 UNIT/GM POWD For use as recommended per package insert to affected areas (under breast) Patient not taking: Reported on 01/05/2017 11/06/14   Velvet Bathe, MD    Physical Exam: Vitals:   01/05/17 1709 01/05/17 1712 01/05/17 1744 01/05/17 1842  BP: 112/83 112/83 114/83 120/78  Pulse: 74  71 69  Resp:   20 18  Temp:    98.8 F (37.1 C)  TempSrc:    Oral  SpO2: 99%  100% 100%  Weight:      Height:    5' 4"  (1.626 m)      Constitutional: Moderately built and nourished. Vitals:   01/05/17 1709 01/05/17 1712 01/05/17 1744 01/05/17 1842  BP: 112/83 112/83 114/83 120/78  Pulse: 74  71 69  Resp:   20 18  Temp:    98.8 F (37.1 C)  TempSrc:    Oral  SpO2: 99%  100% 100%  Weight:      Height:    5' 4"  (1.626 m)   Eyes: Anicteric no pallor. ENMT: No discharge from the ears eyes nose or mouth. Neck: No mass felt. No neck rigidity. Respiratory: No rhonchi or crepitations. Cardiovascular: S1 and S2 heard no murmurs appreciated. Abdomen: Soft mild left lower quadrant tenderness no guarding or rigidity. Musculoskeletal: No edema. No joint effusion. Skin: No rash. The skin appears warm. Neurologic: Alert awake oriented to time place and person. Moves all extremities. Psychiatric: Appears  normal. Normal affect.   Labs on Admission: I have personally reviewed following labs and imaging studies  CBC:  Recent Labs Lab 01/05/17 1305  WBC 8.5  NEUTROABS 5.9  HGB 12.7  HCT 38.9  MCV 75.1*  PLT 563   Basic Metabolic Panel:  Recent Labs Lab 01/05/17 1305  NA 140  K 2.7*  CL 106  CO2 24  GLUCOSE 129*  BUN 17  CREATININE 0.71  CALCIUM 9.2   GFR: Estimated Creatinine Clearance: 104.1 mL/min (by C-G formula based on SCr of 0.71 mg/dL). Liver Function Tests:  Recent Labs Lab 01/05/17 1305  AST 22  ALT 18  ALKPHOS 65  BILITOT 0.5  PROT 6.8  ALBUMIN 4.1    Recent Labs Lab 01/05/17 1305  LIPASE 42   No results for input(s): AMMONIA in the last 168 hours. Coagulation Profile: No results for input(s): INR, PROTIME in the last 168 hours. Cardiac Enzymes: No results for input(s): CKTOTAL, CKMB, CKMBINDEX, TROPONINI in the last 168 hours. BNP (last 3 results) No results for input(s): PROBNP in the last 8760 hours. HbA1C: No results for input(s): HGBA1C in the last 72 hours. CBG: No results for input(s): GLUCAP in the last 168 hours. Lipid Profile: No results for input(s): CHOL, HDL, LDLCALC, TRIG, CHOLHDL, LDLDIRECT in the last 72 hours. Thyroid Function Tests: No results for input(s): TSH, T4TOTAL, FREET4, T3FREE, THYROIDAB in the last 72 hours. Anemia Panel: No results for input(s): VITAMINB12, FOLATE, FERRITIN, TIBC, IRON, RETICCTPCT in the last 72 hours. Urine analysis:    Component Value Date/Time   COLORURINE YELLOW 01/05/2017 1450   APPEARANCEUR CLEAR 01/05/2017 1450   LABSPEC >1.030 (H) 01/05/2017 1450   PHURINE 5.5 01/05/2017 1450   GLUCOSEU NEGATIVE 01/05/2017 1450   HGBUR NEGATIVE 01/05/2017 1450   BILIRUBINUR NEGATIVE 01/05/2017 1450   BILIRUBINUR Neg 08/02/2016 1621   KETONESUR NEGATIVE 01/05/2017 1450   PROTEINUR NEGATIVE 01/05/2017 1450   UROBILINOGEN 0.2 08/02/2016 1621   UROBILINOGEN 0.2 11/04/2014 1714   NITRITE NEGATIVE  01/05/2017 1450   LEUKOCYTESUR NEGATIVE 01/05/2017 1450   Sepsis Labs: @LABRCNTIP (procalcitonin:4,lacticidven:4) )No results found for this or any previous visit (from the past 240 hour(s)).   Radiological Exams on Admission: Ct Abdomen Pelvis W Contrast  Result Date: 01/05/2017 CLINICAL DATA:  Diarrhea and bloody stools with abdominal pain. Some nausea. History of lupus. EXAM: CT ABDOMEN AND PELVIS WITH CONTRAST TECHNIQUE: Multidetector CT imaging of the abdomen and pelvis was performed using the standard protocol following bolus administration of intravenous contrast. CONTRAST:  161m ISOVUE-300 IOPAMIDOL (ISOVUE-300) INJECTION 61% COMPARISON:  08/11/2015 and 04/22/2010 FINDINGS: Lower chest: Minimal bibasilar atelectasis. Hepatobiliary: Within normal. Pancreas: Within normal. Spleen: Within normal. Adrenals/Urinary Tract: Adrenal glands are normal. Kidneys are normal in size without hydronephrosis or nephrolithiasis. Ureters and bladder are normal. Stomach/Bowel: Stomach and small bowel are normal. Appendix is normal. There is wall thickening with very minimal adjacent inflammatory change of the pericolonic fat involving the descending colon likely mild acute colitis. Minimal wall thickening of the hepatic flexure. Vascular/Lymphatic: Minimal calcified plaque over the distal abdominal aorta. No adenopathy. Reproductive: Previous hysterectomy. Other: No significant free fluid.  No free peritoneal air. Musculoskeletal: Minimal degenerate change of the spine. IMPRESSION: Mild wall thickening with subtle adjacent inflammatory change involving the descending colon compatible with a mild acute colitis likely of infectious or inflammatory nature. Electronically Signed   By: DMarin OlpM.D.   On: 01/05/2017 15:44     Assessment/Plan Principal Problem:   Colitis Active Problems:   DM (diabetes mellitus) (HCC)   Psoriatic arthritis (HCC)   Lower GI bleed    1. Colitis - differentials include  infectious versus inflammatory. Patient has been empirically placed on Cipro and Flagyl. Follow stool studies. Continue with gentle hydration and patient is Nothing by mouth in anticipation of GI procedure. [place] gastroenterologist has been consulted by the ER physician. 2. Rectal bleeding - could be from above reason. Patient antibiotics. Lactate levels are normal. GI has been consulted. 3. History of hypertension presently holding of diuretics and closely monitoring with IV fluids. 4. History of lupus psoriatic arthritis and Sjogren's - patient is on hydroxychloroquine and also receives Cosentyx every month. 5. Diabetes mellitus per the chart - closely follow CBGs.  I have reviewed patient's  old charts and labs.   DVT prophylaxis: SCDs. Code Status: Full code.  Family Communication: Discussed with patient.  Disposition Plan: Home.  Consults called: Copywriter, advertising.  Admission status: Inpatient.    Rise Patience MD [facility] Pager [phone].  If 7PM-7AM, please contact night-coverage [email or web address] Password [facility]  01/05/2017, 7:35 PM

## 2017-01-05 NOTE — ED Provider Notes (Signed)
[facility] Provider Note   CSN: [id number] Arrival date & time: 01/05/17  1151     History   Chief Complaint Chief Complaint  Patient presents with  . Abdominal Pain  . Rectal Bleeding    HPI Melinda Porter is a 46 y.o. female.  HPI 46 year old Caucasian female past medical history significant for lupus, hypertension, hyperlipidemia, diabetes, IBS that presents to the ED today with complaints of generalized abdominal pain and rectal bleeding. The patient states that her abdominal pain started after eating a grilled [employer] wrap yesterday. Patient states that the pain began cramping constantly and sharp intermittently. The patient states she has had 6-7 bright red blood per rectum over the past 24 hours. Patient reports loose stools. Denies any associated fever, urinary symptoms, vaginal symptoms. The patient states that she has not had a recent colonoscopy. Patient has not taking at home for her symptoms prior to arrival. Moving and palpation make the pain worse. Nothing makes the pain better. Patient reports some mild nausea but denies any emesis.  Pt denies any fever, chill, ha, vision changes, lightheadedness, dizziness, congestion, neck pain, cp, sob, cough, urinary symptoms, change in bowel habits, melena, lower extremity paresthesias.  Past Medical History:  Diagnosis Date  . Anxiety   . Arthritis   . Depression   . Endometriosis   . Hiatal hernia   . Hyperlipemia   . Hypertension   . Lupus   . PTSD (post-traumatic stress disorder)     Patient Active Problem List   Diagnosis Date Noted  . Colitis 01/05/2017  . Headache 11/05/2014  . Persistent headaches 11/05/2014  . Facial droop 11/05/2014  . DM (diabetes mellitus) ([place]) 11/05/2014  . Hypokalemia 11/05/2014  . Syncope 11/05/2014  . Hypertension 11/05/2014  . Hyperlipemia 11/05/2014  . Psoriatic arthritis ([place]) 11/05/2014  . Hypoglycemia 04/13/2014  . Abdominal pain, epigastric 03/08/2014  .  Belching 03/08/2014  . Nausea without vomiting 03/08/2014    Past Surgical History:  Procedure Laterality Date  . CESAREAN SECTION  1997  . OOPHORECTOMY Bilateral   . PARTIAL HYSTERECTOMY  07/2009  . TEMPOROMANDIBULAR JOINT SURGERY  2009   right side  . TONSILLECTOMY  2009    OB History    Gravida Para Term Preterm AB Living   2 1     1 1    SAB TAB Ectopic Multiple Live Births     1             Home Medications    Prior to Admission medications   Medication Sig Start Date End Date Taking? Authorizing Provider  Cholecalciferol 1000 units capsule Take 1 capsule (1,000 Units total) by mouth daily. 12/27/16   Elby Beck, FNP  clonazePAM (KLONOPIN) 1 MG tablet Take 1 tablet (1 mg total) by mouth 3 (three) times daily as needed. For anxiety 12/27/16   Elby Beck, FNP  COSENTYX 300 DOSE 150 MG/ML SOSY  11/27/16   [provider]  cyclobenzaprine (FLEXERIL) 10 MG tablet Take 1 tablet (10 mg total) by mouth 3 (three) times daily as needed for muscle spasms. 08/02/16   Elby Beck, FNP  doxycycline (VIBRA-TABS) 100 MG tablet Take 1 tablet (100 mg total) by mouth 2 (two) times daily. 10/10/16   Inda Coke, PA  esomeprazole (NEXIUM) 40 MG capsule TAKE ONE CAPSULE BY MOUTH EVERY DAY 07/16/16   Elby Beck, FNP  hydrocortisone cream 1 % Apply 1 application topically 2 (two) times daily as needed. For  itching    [provider]  hydroxychloroquine (PLAQUENIL) 200 MG tablet Take 200 mg by mouth 2 (two) times daily.    [provider]  LOTEMAX 0.5 % GEL  10/15/16   [provider]  nystatin (MYCOSTATIN/NYSTOP) 100000 UNIT/GM POWD For use as recommended per package insert to affected areas (under breast) 11/06/14   Velvet Bathe, MD  sertraline (ZOLOFT) 100 MG tablet Take 1 tablet (100 mg total) by mouth daily. 12/27/16   Elby Beck, FNP  topiramate (TOPAMAX) 50 MG tablet Take 50 mg by mouth 2 (two) times daily.    [provider]  triamterene-hydrochlorothiazide (MAXZIDE-25) 37.5-25 MG tablet Take 1 tablet by mouth daily. 12/27/16   Elby Beck, FNP    Family History Family History  Problem Relation Age of Onset  . Colon polyps Maternal Grandmother   . Lung cancer Maternal Grandmother   . Liver disease Father        agent orange  . Diabetes Maternal Grandfather   . Heart disease Maternal Grandfather   . Hypertension Maternal Grandfather   . Colon cancer Maternal Uncle        great uncle  . Breast cancer Neg Hx     Social History Social History  Substance Use Topics  . Smoking status: Former Smoker    Types: Cigarettes    Quit date: 03/09/1999  . Smokeless tobacco: Never Used  . Alcohol use No     Allergies   Lyrica [pregabalin]; Sulfa antibiotics; Ciprofloxacin; Gabapentin; Nsaids; Penicillins; Tolmetin; Codeine; and Morphine and related   Review of Systems Review of Systems  Constitutional: Negative for chills and fever.  HENT: Negative for congestion.   Eyes: Negative for visual disturbance.  Respiratory: Negative for cough and shortness of breath.   Cardiovascular: Negative for chest pain.  Gastrointestinal: Positive for abdominal pain, blood in stool, diarrhea and nausea. Negative for constipation and vomiting.  Genitourinary: Negative for dysuria, flank pain, frequency, hematuria, urgency, vaginal bleeding and vaginal discharge.  Musculoskeletal: Negative for arthralgias and myalgias.  Skin: Negative for rash.  Neurological: Negative for dizziness, syncope, weakness, light-headedness, numbness and headaches.  Psychiatric/Behavioral: Negative for sleep disturbance. The patient is not nervous/anxious.      Physical Exam Updated Vital Signs BP 112/83   Pulse 71   Temp 98.1 F (36.7 C)   Resp 18   Ht 5' 4"  (1.626 m)   Wt 101.6 kg (224 lb)   LMP 06/23/2009   SpO2 97%   BMI 38.45 kg/m   Physical Exam  Constitutional: She is oriented to person, place, and  time. She appears well-developed and well-nourished.  Non-toxic appearance. No distress.  HENT:  Head: Normocephalic and atraumatic.  Nose: Nose normal.  Mouth/Throat: Oropharynx is clear and moist.  Eyes: Pupils are equal, round, and reactive to light. Conjunctivae are normal. Right eye exhibits no discharge. Left eye exhibits no discharge.  Neck: Normal range of motion. Neck supple.  Cardiovascular: Normal rate, regular rhythm, normal heart sounds and intact distal pulses.  Exam reveals no gallop and no friction rub.   No murmur heard. Pulmonary/Chest: Effort normal and breath sounds normal. No respiratory distress. She has no wheezes. She has no rales. She exhibits no tenderness.  Abdominal: Soft. Bowel sounds are increased. There is generalized tenderness and tenderness in the epigastric area and left lower quadrant. There is no rigidity, no rebound, no guarding, no CVA tenderness, no tenderness at McBurney's point and negative Murphy's sign.  Genitourinary:  Genitourinary  Comments: Chaperone present for exam. Pt tolerated without difficulty. No external hemorrhoids or fissures noted. No pain with palpation of the rectal vault. No internal hemorrhoids noted. Hematochezia noted. No gross melena. Hemoccult positive.    Musculoskeletal: Normal range of motion. She exhibits no tenderness.  Lymphadenopathy:    She has no cervical adenopathy.  Neurological: She is alert and oriented to person, place, and time.  Skin: Skin is warm and dry. Capillary refill takes less than 2 seconds.  Psychiatric: Her behavior is normal. Judgment and thought content normal.  Nursing note and vitals reviewed.    ED Treatments / Results  Labs (all labs ordered are listed, but only abnormal results are displayed) Labs Reviewed  COMPREHENSIVE METABOLIC PANEL - Abnormal; Notable for the following:       Result Value   Potassium 2.7 (*)    Glucose, Bld 129 (*)    All other components within normal limits  CBC  WITH DIFFERENTIAL/PLATELET - Abnormal; Notable for the following:    RBC 5.18 (*)    MCV 75.1 (*)    MCH 24.5 (*)    RDW 16.5 (*)    All other components within normal limits  URINALYSIS, ROUTINE W REFLEX MICROSCOPIC - Abnormal; Notable for the following:    Specific Gravity, Urine >1.030 (*)    All other components within normal limits  OCCULT BLOOD X 1 CARD TO LAB, STOOL - Abnormal; Notable for the following:    Fecal Occult Bld POSITIVE (*)    All other components within normal limits  GASTROINTESTINAL PANEL BY PCR, STOOL (REPLACES STOOL CULTURE)  C DIFFICILE QUICK SCREEN W PCR REFLEX  LIPASE, BLOOD  I-STAT CG4 LACTIC ACID, ED    EKG  EKG Interpretation None       Radiology Ct Abdomen Pelvis W Contrast  Result Date: 01/05/2017 CLINICAL DATA:  Diarrhea and bloody stools with abdominal pain. Some nausea. History of lupus. EXAM: CT ABDOMEN AND PELVIS WITH CONTRAST TECHNIQUE: Multidetector CT imaging of the abdomen and pelvis was performed using the standard protocol following bolus administration of intravenous contrast. CONTRAST:  153m ISOVUE-300 IOPAMIDOL (ISOVUE-300) INJECTION 61% COMPARISON:  08/11/2015 and 04/22/2010 FINDINGS: Lower chest: Minimal bibasilar atelectasis. Hepatobiliary: Within normal. Pancreas: Within normal. Spleen: Within normal. Adrenals/Urinary Tract: Adrenal glands are normal. Kidneys are normal in size without hydronephrosis or nephrolithiasis. Ureters and bladder are normal. Stomach/Bowel: Stomach and small bowel are normal. Appendix is normal. There is wall thickening with very minimal adjacent inflammatory change of the pericolonic fat involving the descending colon likely mild acute colitis. Minimal wall thickening of the hepatic flexure. Vascular/Lymphatic: Minimal calcified plaque over the distal abdominal aorta. No adenopathy. Reproductive: Previous hysterectomy. Other: No significant free fluid.  No free peritoneal air. Musculoskeletal: Minimal  degenerate change of the spine. IMPRESSION: Mild wall thickening with subtle adjacent inflammatory change involving the descending colon compatible with a mild acute colitis likely of infectious or inflammatory nature. Electronically Signed   By: DMarin OlpM.D.   On: 01/05/2017 15:44    Procedures Procedures (including critical care time)  Medications Ordered in ED Medications  ciprofloxacin (CIPRO) IVPB 400 mg (not administered)  metroNIDAZOLE (FLAGYL) IVPB 500 mg (500 mg Intravenous New Bag/Given 01/05/17 1713)  sodium chloride 0.9 % bolus 1,000 mL (0 mLs Intravenous Stopped 01/05/17 1405)  gi cocktail (Maalox,Lidocaine,Donnatal) (30 mLs Oral Given 01/05/17 1324)  famotidine (PEPCID) IVPB 20 mg premix (0 mg Intravenous Stopped 01/05/17 1404)  pantoprazole (PROTONIX) injection 40 mg (40 mg Intravenous Given 01/05/17  1326)  dicyclomine (BENTYL) injection 20 mg (20 mg Intramuscular Given 01/05/17 1326)  metoCLOPramide (REGLAN) injection 10 mg (10 mg Intravenous Given 01/05/17 1326)  potassium chloride SA (K-DUR,KLOR-CON) CR tablet 40 mEq (40 mEq Oral Given 01/05/17 1502)  potassium chloride 10 mEq in 100 mL IVPB (0 mEq Intravenous Stopped 01/05/17 1620)  iopamidol (ISOVUE-300) 61 % injection 100 mL (100 mLs Intravenous Contrast Given 01/05/17 1514)  sodium chloride 0.9 % bolus 1,000 mL (1,000 mLs Intravenous New Bag/Given 01/05/17 1656)     Initial Impression / Assessment and Plan / ED Course  I have reviewed the triage vital signs and the nursing notes.  Pertinent labs & imaging results that were available during my care of the patient were reviewed by me and considered in my medical decision making (see chart for details).     Patient resents to the ED with generalized abdominal pain and rectal bleeding. Patient denies any associated fevers, urinary symptoms, emesis. Denies any recent travel or antibiotic use.  Vital signs are reassuring. Patient is afebrile. No tachycardia or hypotension  noted.  Patient with a generalized abdominal tenderness to palpation but no signs of rigidity. Lungs clear to auscultation bilaterally. Rectal exam reveals frank hematochezia.   Lab work reveals no leukocytosis. Does note a potassium of 2.7. This was replaced with oral and IV potassium. Hemoglobin is stable at 12.7. Normal creatinine and BUN. UA shows no signs of infection. Lactic acid is normal.  CT scan was obtained that shows changes consistent with acute colitis no other intra-abdominal pathology noted.  Spoke with Dr. Havery Moros with gastroenterology who recommends stool culture and C. difficile culture. He also recommends patient be admitted to the hospital service for trending hemoglobins will consultation in the a.m. for possible colonoscopy. Concern for possible ischemic colitis versus infectious colitis.  Doubt acute blood loss given normal vital signs and normal hemoglobin.  Ischemic colitis less likely given normal lactic acid. We'll start patient on IV antibiotics. We'll start patient on Cipro and Flagyl. Patient does have an intolerance to Cipro orally with nausea however no anaphylaxis we will go ahead with Cipro this time.  Spoke with Dr. Broadus John with hospital medicine at [facility] long who agrees to admission. Patient was transported by care link to [facility]. She was updated on plan of care. EMTALA was placed. Patient remains hemodynamically stable this time. She does not want any further pain medicine.     Final Clinical Impressions(s) / ED Diagnoses   Final diagnoses:  Colitis  Lower GI bleed    New Prescriptions New Prescriptions   No medications on file     Aaron Edelman 01/05/17 1727    Macarthur Critchley, MD 01/18/17 0002

## 2017-01-06 ENCOUNTER — Encounter (HOSPITAL_COMMUNITY): Payer: Self-pay | Admitting: *Deleted

## 2017-01-06 ENCOUNTER — Encounter (HOSPITAL_COMMUNITY)
Admission: EM | Disposition: A | Discharge status: Home / Self Care | Payer: Self-pay | Source: Home / Self Care | Attending: Physician Assistant

## 2017-01-06 DIAGNOSIS — K921 Melena: Secondary | ICD-10-CM | POA: Diagnosis not present

## 2017-01-06 DIAGNOSIS — L4050 Arthropathic psoriasis, unspecified: Secondary | ICD-10-CM | POA: Diagnosis not present

## 2017-01-06 DIAGNOSIS — K529 Noninfective gastroenteritis and colitis, unspecified: Secondary | ICD-10-CM | POA: Diagnosis not present

## 2017-01-06 DIAGNOSIS — K922 Gastrointestinal hemorrhage, unspecified: Secondary | ICD-10-CM | POA: Diagnosis not present

## 2017-01-06 DIAGNOSIS — E118 Type 2 diabetes mellitus with unspecified complications: Secondary | ICD-10-CM

## 2017-01-06 HISTORY — PX: FLEXIBLE SIGMOIDOSCOPY: SHX5431

## 2017-01-06 LAB — GLUCOSE, CAPILLARY
GLUCOSE-CAPILLARY: 84 mg/dL (ref 65–99)
Glucose-Capillary: 97 mg/dL (ref 65–99)

## 2017-01-06 LAB — CBC
HCT: 36.4 % (ref 36.0–46.0)
HEMATOCRIT: 35.4 % — AB (ref 36.0–46.0)
HEMOGLOBIN: 11.4 g/dL — AB (ref 12.0–15.0)
Hemoglobin: 11.5 g/dL — ABNORMAL LOW (ref 12.0–15.0)
MCH: 24.2 pg — AB (ref 26.0–34.0)
MCH: 24.6 pg — ABNORMAL LOW (ref 26.0–34.0)
MCHC: 31.6 g/dL (ref 30.0–36.0)
MCHC: 32.2 g/dL (ref 30.0–36.0)
MCV: 76.5 fL — AB (ref 78.0–100.0)
MCV: 76.5 fL — AB (ref 78.0–100.0)
PLATELETS: 123 10*3/uL — AB (ref 150–400)
Platelets: 122 10*3/uL — ABNORMAL LOW (ref 150–400)
RBC: 4.63 MIL/uL (ref 3.87–5.11)
RBC: 4.76 MIL/uL (ref 3.87–5.11)
RDW: 16.1 % — ABNORMAL HIGH (ref 11.5–15.5)
RDW: 16.2 % — ABNORMAL HIGH (ref 11.5–15.5)
WBC: 6.6 10*3/uL (ref 4.0–10.5)
WBC: 7.8 10*3/uL (ref 4.0–10.5)

## 2017-01-06 LAB — BASIC METABOLIC PANEL
ANION GAP: 7 (ref 5–15)
BUN: 10 mg/dL (ref 6–20)
CO2: 23 mmol/L (ref 22–32)
Calcium: 8 mg/dL — ABNORMAL LOW (ref 8.9–10.3)
Chloride: 113 mmol/L — ABNORMAL HIGH (ref 101–111)
Creatinine, Ser: 0.7 mg/dL (ref 0.44–1.00)
GFR calc Af Amer: >60 mL/min (ref >60)
GFR calc non Af Amer: >60 mL/min (ref >60)
Glucose, Bld: 88 mg/dL (ref 65–99)
POTASSIUM: 3.2 mmol/L — AB (ref 3.5–5.1)
Sodium: 143 mmol/L (ref 135–145)

## 2017-01-06 LAB — HIV ANTIBODY (ROUTINE TESTING W REFLEX): HIV Screen 4th Generation wRfx: NONREACTIVE

## 2017-01-06 LAB — ABO/RH: ABO/RH(D): O POS

## 2017-01-06 SURGERY — SIGMOIDOSCOPY, FLEXIBLE
Anesthesia: Moderate Sedation

## 2017-01-06 MED ORDER — MIDAZOLAM HCL 5 MG/ML IJ SOLN
INTRAMUSCULAR | Status: AC
  Filled 2017-01-06: qty 2

## 2017-01-06 MED ORDER — FENTANYL CITRATE (PF) 100 MCG/2ML IJ SOLN
INTRAMUSCULAR | Status: AC
  Filled 2017-01-06: qty 2

## 2017-01-06 MED ORDER — FENTANYL CITRATE (PF) 100 MCG/2ML IJ SOLN
INTRAMUSCULAR | Status: DC | PRN
  Administered 2017-01-06 (×4): 25 ug via INTRAVENOUS

## 2017-01-06 MED ORDER — POTASSIUM CHLORIDE IN NACL 40-0.9 MEQ/L-% IV SOLN
INTRAVENOUS | Status: DC
  Administered 2017-01-06: 125 mL/h via INTRAVENOUS
  Filled 2017-01-06 (×3): qty 1000

## 2017-01-06 MED ORDER — MIDAZOLAM HCL 10 MG/2ML IJ SOLN
INTRAMUSCULAR | Status: DC | PRN
  Administered 2017-01-06 (×5): 2 mg via INTRAVENOUS

## 2017-01-06 NOTE — H&P (View-Only) (Signed)
[facility] Consult: 11:08 AM 01/06/2017  LOS: 1 day    Referring Provider: Dr Rama  Primary Care Physician:  Elby Beck, FNP Primary Gastroenterologist:  Dr. Henrene Pastor    Reason for Consultation:  Bloody diarrhea.  Abdominal pain.  Descending colitis.     HPI: Melinda Porter is a 46 y.o. female.  PMH Htn.  Anxiety/depression, PTSD.  DM, on no meds.  Lupus. Sjogrens syndrome. Psoriatic arthritis, on biologic therapy and Plaquenil.  Endometriosis.  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  Fatty liver and contracted GB on ultrasound 02/2016   04/2014 EGD for epigastric abd pain, belching: Normal study. Clotest to assess for previously treated positive serum H Pylori (in 2013) was negative.   Pt suffers intermittently from nausea, does not vomit often, sxs present for a few years. For about 1 month having weekly, 1 day spells of diffuse abdominal cramping pain and watery, non-bloody diarrhea.  She has been following a keto type diet.  Also avoids gluten.  Stopped eating eggs in recent weeks as they seemed to be one cause of her sxs.  Saturday night the diarrhea and cramps were worst than usual.  Sunday AM she was having pain and passed blood diarrhea.  No nausea or vomiting.   CT scan shows colitis in descending colon.   WBCs not elevated.   Hgb 11.5 with microcytosis at 76.5.  Platelets low at 123.   Stool studies for C. Difficile and pathogens ordered but not collected.    In following the Keto diet, the patient has dropped from 240# down to 224#.  Family history positive for colon cancer in a great uncle.  Her father had cirrhosis from alcohol and possibly from agent orange exposure.  She works from home, doing Armed forces logistics/support/administrative officer.    Past Medical History:  Diagnosis Date  .  Anxiety   . Arthritis   . Depression   . Endometriosis   . Hiatal hernia   . Hyperlipemia   . Hypertension   . Lupus   . PTSD (post-traumatic stress disorder)     Past Surgical History:  Procedure Laterality Date  . CESAREAN SECTION  1997  . OOPHORECTOMY Bilateral   . PARTIAL HYSTERECTOMY  07/2009  . TEMPOROMANDIBULAR JOINT SURGERY  2009   right side  . TONSILLECTOMY  2009    Prior to Admission medications   Medication Sig Start Date End Date Taking? Authorizing Provider  Cholecalciferol 1000 units capsule Take 1 capsule (1,000 Units total) by mouth daily. 12/27/16  Yes Elby Beck, FNP  clonazePAM (KLONOPIN) 1 MG tablet Take 1 tablet (1 mg total) by mouth 3 (three) times daily as needed. For anxiety 12/27/16  Yes Elby Beck, FNP  COSENTYX 300 DOSE 150 MG/ML SOSY  11/27/16  Yes [provider]  cyclobenzaprine (FLEXERIL) 10 MG tablet Take 1 tablet (10 mg total) by mouth 3 (three) times daily as needed for muscle spasms. 08/02/16  Yes Elby Beck, FNP  esomeprazole (NEXIUM) 40 MG capsule TAKE ONE CAPSULE BY MOUTH  EVERY DAY 07/16/16  Yes Elby Beck, FNP  hydrocortisone cream 1 % Apply 1 application topically 2 (two) times daily as needed. For itching   Yes [provider]  hydroxychloroquine (PLAQUENIL) 200 MG tablet Take 200 mg by mouth 2 (two) times daily.   Yes [provider]  sertraline (ZOLOFT) 100 MG tablet Take 1 tablet (100 mg total) by mouth daily. 12/27/16  Yes Elby Beck, FNP  topiramate (TOPAMAX) 50 MG tablet Take 50 mg by mouth 2 (two) times daily.   Yes [provider]  triamterene-hydrochlorothiazide (MAXZIDE-25) 37.5-25 MG tablet Take 1 tablet by mouth daily. 12/27/16  Yes Elby Beck, FNP  doxycycline (VIBRA-TABS) 100 MG tablet Take 1 tablet (100 mg total) by mouth 2 (two) times daily. Patient not taking: Reported on 01/05/2017 10/10/16   Inda Coke, PA  nystatin (MYCOSTATIN/NYSTOP) 100000  UNIT/GM POWD For use as recommended per package insert to affected areas (under breast) Patient not taking: Reported on 01/05/2017 11/06/14   Velvet Bathe, MD    Scheduled Meds: . hydroxychloroquine  200 mg Oral BID  . sertraline  100 mg Oral Daily  . topiramate  50 mg Oral BID   Infusions: . 0.9 % NaCl with KCl 40 mEq / L    . ciprofloxacin Stopped (01/06/17 0618)  . metronidazole Stopped (01/06/17 1044)   PRN Meds: acetaminophen **OR** acetaminophen, clonazePAM, ondansetron **OR** ondansetron (ZOFRAN) IV   Allergies as of 01/05/2017 - Review Complete 01/05/2017  Allergen Reaction Noted  . Lyrica [pregabalin] Shortness Of Breath 06/14/2016  . Sulfa antibiotics Rash 11/28/2011  . Ciprofloxacin Nausea Only 11/27/2015  . Gabapentin  11/27/2015  . Nsaids Other (See Comments) 11/04/2014  . Tolmetin  07/18/2015  . Codeine Rash 11/28/2011  . Humira [adalimumab] Rash 01/05/2017  . Morphine and related Rash 11/28/2011  . Penicillins Rash 10/25/2012    Family History  Problem Relation Age of Onset  . Colon polyps Maternal Grandmother   . Lung cancer Maternal Grandmother   . Liver disease Father        agent orange  . Diabetes Maternal Grandfather   . Heart disease Maternal Grandfather   . Hypertension Maternal Grandfather   . Colon cancer Maternal Uncle        great uncle  . Breast cancer Neg Hx     Social History   Social History  . Marital status: Divorced    Spouse name: N/A  . Number of children: 1  . Years of education: N/A   Occupational History  . work from home [employer] Topics  . Smoking status: Former Smoker    Types: Cigarettes    Quit date: 03/09/1999  . Smokeless tobacco: Never Used  . Alcohol use No  . Drug use: No  . Sexual activity: Yes    Partners: Male    Birth control/ protection: Surgical   Other Topics Concern  . Not on file   Social History Narrative  . No narrative on file    REVIEW OF SYSTEMS: Constitutional:   Generally does not suffer from weakness but Saturday night did have malaise ENT:  Patient says her lupus causes ulcers in her nose which occasionally bleed and a mild-to-moderate manner. Pulm:  No shortness of breath or cough. CV:  No palpitations, no LE edema. No chest pain GU:  No hematuria, no frequency. GI:  Per HPI.  Denies dysphagia. Heme:  No unusual bleeding/bruising.   Transfusions:  No previous transfusions  Neuro:  No headaches, no peripheral tingling or numbness Derm:  No itching, no rash or sores.  Endocrine:  No sweats or chills.  No polyuria or dysuria Immunization: reviewed Travel:  None beyond local counties in last few months.    PHYSICAL EXAM: Vital signs in last 24 hours: Vitals:   01/05/17 1941 01/06/17 0601  BP: 114/77 117/69  Pulse: 64 67  Resp: 20 18  Temp: 99.2 F (37.3 C) 98.9 F (37.2 C)  SpO2: 97% 99%   Wt Readings from Last 3 Encounters:  01/05/17 101.6 kg (224 lb)  12/04/16 103.9 kg (229 lb)  10/10/16 106.4 kg (234 lb 8 oz)    General: pleasant, obese.  Nontoxic-appearing Head:  No facial asymmetry or swelling.  Eyes:  No scleral icterus, no conjunctival pallor.  EOMI Ears:  Not hard of hearing.  Nose:  No congestion or discharge Mouth:  Tongue midline.  Oral mucosa moist and clear.  Good dentition. Neck:  No adenopathy, no JVD, no TMJ.  No masses Lungs:  Clear to auscultation and percussion bilaterally.  No dyspnea or cough. Heart: RRR.  No MRG.  S1, S2 present Abdomen:  Soft.  Diffuse mild tenderness in all four quadrants.  No masses, no HSM, no hernias, no bruits..   Rectal: deferred.  Did see bloody, mucoid stool in the "hat" receptacle in the commode. Musc/Skeltl: no joint erythema, swelling or gross deformities. Extremities:  CC E.  Neurologic:  Oriented 3.  Good historian Skin:  No rashes, sores. Tattoos:  One on the upper trunk Nodes:  No cervical adenopathy   Psych:  Cooperative, pleasant.  Affect engaged  Intake/Output from  previous day: 09/23 0701 - 09/24 0700 In: 2195.8 [I.V.:1045.8; IV Piggyback:1150] Out: -  Intake/Output this shift: No intake/output data recorded.  LAB RESULTS:  Recent Labs  01/05/17 2005 01/05/17 2338 01/06/17 0249  WBC 7.5 7.8 6.6  HGB 11.9* 11.4* 11.5*  HCT 37.4 35.4* 36.4  PLT 120* 122* 123*   BMET Lab Results  Component Value Date   NA 143 01/06/2017   NA 140 01/05/2017   NA 143 12/04/2016   K 3.2 (L) 01/06/2017   K 2.7 (LL) 01/05/2017   K 3.6 12/04/2016   CL 113 (H) 01/06/2017   CL 106 01/05/2017   CL 108 12/04/2016   CO2 23 01/06/2017   CO2 24 01/05/2017   CO2 25 12/04/2016   GLUCOSE 88 01/06/2017   GLUCOSE 129 (H) 01/05/2017   GLUCOSE 110 (H) 12/04/2016   BUN 10 01/06/2017   BUN 17 01/05/2017   BUN 20 12/04/2016   CREATININE 0.70 01/06/2017   CREATININE 0.71 01/05/2017   CREATININE 0.92 12/04/2016   CALCIUM 8.0 (L) 01/06/2017   CALCIUM 9.2 01/05/2017   CALCIUM 9.4 12/04/2016   LFT  Recent Labs  01/05/17 1305  PROT 6.8  ALBUMIN 4.1  AST 22  ALT 18  ALKPHOS 65  BILITOT 0.5   PT/INR No results found for: INR, PROTIME Hepatitis Panel No results for input(s): HEPBSAG, HCVAB, HEPAIGM, HEPBIGM in the last 72 hours. C-Diff No components found for: CDIFF Lipase     Component Value Date/Time   LIPASE 42 01/05/2017 1305    Drugs of Abuse     Component Value Date/Time   LABOPIA NONE DETECTED 11/04/2014 1714   COCAINSCRNUR NONE DETECTED 11/04/2014 1714   LABBENZ NONE DETECTED 11/04/2014 1714   AMPHETMU NONE DETECTED 11/04/2014 1714   THCU NONE DETECTED 11/04/2014 1714   LABBARB NONE DETECTED 11/04/2014  [place]: Ct Abdomen Pelvis W Contrast  Result Date: 01/05/2017 CLINICAL DATA:  Diarrhea and bloody stools with abdominal pain. Some nausea. History of lupus. EXAM: CT ABDOMEN AND PELVIS WITH CONTRAST TECHNIQUE: Multidetector CT imaging of the abdomen and pelvis was performed using the standard protocol following bolus  administration of intravenous contrast. CONTRAST:  153m ISOVUE-300 IOPAMIDOL (ISOVUE-300) INJECTION 61% COMPARISON:  08/11/2015 and 04/22/2010 FINDINGS: Lower chest: Minimal bibasilar atelectasis. Hepatobiliary: Within normal. Pancreas: Within normal. Spleen: Within normal. Adrenals/Urinary Tract: Adrenal glands are normal. Kidneys are normal in size without hydronephrosis or nephrolithiasis. Ureters and bladder are normal. Stomach/Bowel: Stomach and small bowel are normal. Appendix is normal. There is wall thickening with very minimal adjacent inflammatory change of the pericolonic fat involving the descending colon likely mild acute colitis. Minimal wall thickening of the hepatic flexure. Vascular/Lymphatic: Minimal calcified plaque over the distal abdominal aorta. No adenopathy. Reproductive: Previous hysterectomy. Other: No significant free fluid.  No free peritoneal air. Musculoskeletal: Minimal degenerate change of the spine. IMPRESSION: Mild wall thickening with subtle adjacent inflammatory change involving the descending colon compatible with a mild acute colitis likely of infectious or inflammatory nature. Electronically Signed   By: DMarin OlpM.D.   On: 01/05/2017 15:44      IMPRESSION:   *  Descending colitis.  R/o ischemic colitis vs infectious.  Acutely having bloody diarrhea but in recent weeks having sub 24 hour episodes abdominal pain with watery diarrhea.  *  Lupus.  Sjogren's syndrome.  Psoriatic arthritis.  On biologic therapy along with plaque renal.  *  Thrombocytopenia.  Noncritical.  Issue dates back to 2010.    PLAN:     *  Flexible sigmoidoscopy this afternoon.  Tapwater enema beforehand.  Patient does not feel she would be able to tolerate a full bowel prep as would be required for colonoscopy.     SAzucena Freed 01/06/2017, 11:08 AM Pager: [phone]

## 2017-01-06 NOTE — Progress Notes (Signed)
Progress Note    Melinda Porter  AJO:[id number] DOB: [birth year]  DOA: 01/05/2017 PCP: Elby Beck, FNP    Brief Narrative:   Chief complaint: Follow-up bloody diarrhea  Medical records reviewed and are as summarized below:  Melinda Porter is an 46 y.o. female the PMH of lupus, psoriatic arthritis, Sjogren syndrome, and hypertension who was admitted 01/05/17 for evaluation of bloody stools. CT of the abdomen/pelvis showed descending colitis.  Assessment/Plan:   Principal Problem:   Colitis with lower GI bleed Continue Cipro/Flagyl and Bentyl. Follow-up stool studies. Gastroenterologist consulted and proceeded with flexible sigmoidoscopy which showed inflammation concerning for infectious versus inflammatory process. Hgb stable.  Active Problems:   Thrombocytopenia Mild, monitor.    Hypokalemia Add potassium to IV fluids.    Hypertension Continue to hold diuretics.    DM (diabetes mellitus) ([place]) Monitor CBGs.    Psoriatic arthritis ([facility])/lupus/Sjogren syndrome Managed with hydroxychloroquine and Cosentyx every month.    Obesity Body mass index is 38.45 kg/m.   Family Communication/Anticipated D/C date and plan/Code Status   DVT prophylaxis: SCDs ordered. Code Status: Full Code.  Family Communication: No family at the bedside. Disposition Plan: Home when cleared by GI. Awaiting stool studies.   Medical Consultants:    GI   Anti-Infectives:   Flagyl 01/05/17---> Cipro 01/05/17 --->  Subjective:   Reports bloody stool, no clots or fecal material per nurse, but 100-200 cc blood noted. Reports some crampy abdominal pain, nausea and anorexia as well.  Objective:    Vitals:   01/05/17 1744 01/05/17 1842 01/05/17 1941 01/06/17 0601  BP: 114/83 120/78 114/77 117/69  Pulse: 71 69 64 67  Resp: 20 18 20 18   Temp:  98.8 F (37.1 C) 99.2 F (37.3 C) 98.9 F (37.2 C)  TempSrc:  Oral Oral Oral  SpO2: 100% 100% 97% 99%  Weight:      Height:  5'  4" (1.626 m)      Intake/Output Summary (Last 24 hours) at 01/06/17 0735 Last data filed at 01/06/17 0400  Gross per 24 hour  Intake          2195.83 ml  Output                0 ml  Net          2195.83 ml   Filed Weights   01/05/17 1213  Weight: 101.6 kg (224 lb)    Exam: General: No acute distress. Cardiovascular: Heart sounds show a regular rate, and rhythm. No gallops or rubs. No murmurs. No JVD. Lungs: Clear to auscultation bilaterally with good air movement. No rales, rhonchi or wheezes. Abdomen: Soft, mildly tender to palpation in the lower quadrants, nondistended with normal active bowel sounds. No masses. No hepatosplenomegaly. Skin: Warm and dry. No rashes or lesions. Extremities: No clubbing or cyanosis. No edema. Pedal pulses 2+.  Data Reviewed:   I have personally reviewed following labs and imaging studies:  Labs: Labs show the following: Sodium 143, potassium 3.2, chloride 113, bicarbonate 23, BUN 10, creatinine 0.70, glucose 88. LFTs WNL. Lipase 42. WBC 6.6, hemoglobin 11.5, platelets 123.   Microbiology No results found for this or any previous visit (from the past 240 hour(s)).  Procedures and diagnostic studies:  Ct Abdomen Pelvis W Contrast  Result Date: 01/05/2017 CLINICAL DATA:  Diarrhea and bloody stools with abdominal pain. Some nausea. History of lupus. EXAM: CT ABDOMEN AND PELVIS WITH CONTRAST TECHNIQUE: Multidetector CT imaging of the abdomen and  pelvis was performed using the standard protocol following bolus administration of intravenous contrast. CONTRAST:  130m ISOVUE-300 IOPAMIDOL (ISOVUE-300) INJECTION 61% COMPARISON:  08/11/2015 and 04/22/2010 FINDINGS: Lower chest: Minimal bibasilar atelectasis. Hepatobiliary: Within normal. Pancreas: Within normal. Spleen: Within normal. Adrenals/Urinary Tract: Adrenal glands are normal. Kidneys are normal in size without hydronephrosis or nephrolithiasis. Ureters and bladder are normal. Stomach/Bowel:  Stomach and small bowel are normal. Appendix is normal. There is wall thickening with very minimal adjacent inflammatory change of the pericolonic fat involving the descending colon likely mild acute colitis. Minimal wall thickening of the hepatic flexure. Vascular/Lymphatic: Minimal calcified plaque over the distal abdominal aorta. No adenopathy. Reproductive: Previous hysterectomy. Other: No significant free fluid.  No free peritoneal air. Musculoskeletal: Minimal degenerate change of the spine. IMPRESSION: Mild wall thickening with subtle adjacent inflammatory change involving the descending colon compatible with a mild acute colitis likely of infectious or inflammatory nature. Electronically Signed   By: DMarin OlpM.D.   On: 01/05/2017 15:44    Medications:   . hydroxychloroquine  200 mg Oral BID  . sertraline  100 mg Oral Daily  . topiramate  50 mg Oral BID   Continuous Infusions: . sodium chloride 125 mL/hr at 01/05/17 1938  . ciprofloxacin Stopped (01/06/17 0618)  . metronidazole Stopped (01/06/17 0125)     LOS: 1 day   Lannah Koike  [facility] Pager [phone] If unable to reach me by pager, please call my cell phone at [phone]  *Please refer to [email or web address], password [email or web address] to get updated schedule on who will round on this patient, as hospitalists switch teams weekly. If 7PM-7AM, please contact night-coverage at [email or web address], password [email or web address] for any overnight needs.  01/06/2017, 7:35 AM

## 2017-01-06 NOTE — Interval H&P Note (Signed)
History and Physical Interval Note:  01/06/2017 3:00 PM  Melinda Porter  has presented today for surgery, with the diagnosis of descending colitis  The various methods of treatment have been discussed with the patient and family. After consideration of risks, benefits and other options for treatment, the patient has consented to  Procedure(s): FLEXIBLE SIGMOIDOSCOPY (N/A) as a surgical intervention .  The patient's history has been reviewed, patient examined, no change in status, stable for surgery.  I have reviewed the patient's chart and labs.  Questions were answered to the patient's satisfaction.     [place]

## 2017-01-06 NOTE — Consult Note (Signed)
[facility] Consult: 11:08 AM 01/06/2017  LOS: 1 day    Referring Provider: Dr Rama  Primary Care Physician:  Elby Beck, FNP Primary Gastroenterologist:  Dr. Henrene Pastor    Reason for Consultation:  Bloody diarrhea.  Abdominal pain.  Descending colitis.     HPI: Melinda Porter is a 46 y.o. female.  PMH Htn.  Anxiety/depression, PTSD.  DM, on no meds.  Lupus. Sjogrens syndrome. Psoriatic arthritis, on biologic therapy and Plaquenil.  Endometriosis.  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  Fatty liver and contracted GB on ultrasound 02/2016   04/2014 EGD for epigastric abd pain, belching: Normal study. Clotest to assess for previously treated positive serum H Pylori (in 2013) was negative.   Pt suffers intermittently from nausea, does not vomit often, sxs present for a few years. For about 1 month having weekly, 1 day spells of diffuse abdominal cramping pain and watery, non-bloody diarrhea.  She has been following a keto type diet.  Also avoids gluten.  Stopped eating eggs in recent weeks as they seemed to be one cause of her sxs.  Saturday night the diarrhea and cramps were worst than usual.  Sunday AM she was having pain and passed blood diarrhea.  No nausea or vomiting.   CT scan shows colitis in descending colon.   WBCs not elevated.   Hgb 11.5 with microcytosis at 76.5.  Platelets low at 123.   Stool studies for C. Difficile and pathogens ordered but not collected.    In following the Keto diet, the patient has dropped from 240# down to 224#.  Family history positive for colon cancer in a great uncle.  Her father had cirrhosis from alcohol and possibly from agent orange exposure.  She works from home, doing Armed forces logistics/support/administrative officer.    Past Medical History:  Diagnosis Date  .  Anxiety   . Arthritis   . Depression   . Endometriosis   . Hiatal hernia   . Hyperlipemia   . Hypertension   . Lupus   . PTSD (post-traumatic stress disorder)     Past Surgical History:  Procedure Laterality Date  . CESAREAN SECTION  1997  . OOPHORECTOMY Bilateral   . PARTIAL HYSTERECTOMY  07/2009  . TEMPOROMANDIBULAR JOINT SURGERY  2009   right side  . TONSILLECTOMY  2009    Prior to Admission medications   Medication Sig Start Date End Date Taking? Authorizing Provider  Cholecalciferol 1000 units capsule Take 1 capsule (1,000 Units total) by mouth daily. 12/27/16  Yes Elby Beck, FNP  clonazePAM (KLONOPIN) 1 MG tablet Take 1 tablet (1 mg total) by mouth 3 (three) times daily as needed. For anxiety 12/27/16  Yes Elby Beck, FNP  COSENTYX 300 DOSE 150 MG/ML SOSY  11/27/16  Yes [provider]  cyclobenzaprine (FLEXERIL) 10 MG tablet Take 1 tablet (10 mg total) by mouth 3 (three) times daily as needed for muscle spasms. 08/02/16  Yes Elby Beck, FNP  esomeprazole (NEXIUM) 40 MG capsule TAKE ONE CAPSULE BY MOUTH  EVERY DAY 07/16/16  Yes Elby Beck, FNP  hydrocortisone cream 1 % Apply 1 application topically 2 (two) times daily as needed. For itching   Yes [provider]  hydroxychloroquine (PLAQUENIL) 200 MG tablet Take 200 mg by mouth 2 (two) times daily.   Yes [provider]  sertraline (ZOLOFT) 100 MG tablet Take 1 tablet (100 mg total) by mouth daily. 12/27/16  Yes Elby Beck, FNP  topiramate (TOPAMAX) 50 MG tablet Take 50 mg by mouth 2 (two) times daily.   Yes [provider]  triamterene-hydrochlorothiazide (MAXZIDE-25) 37.5-25 MG tablet Take 1 tablet by mouth daily. 12/27/16  Yes Elby Beck, FNP  doxycycline (VIBRA-TABS) 100 MG tablet Take 1 tablet (100 mg total) by mouth 2 (two) times daily. Patient not taking: Reported on 01/05/2017 10/10/16   Inda Coke, PA  nystatin (MYCOSTATIN/NYSTOP) 100000  UNIT/GM POWD For use as recommended per package insert to affected areas (under breast) Patient not taking: Reported on 01/05/2017 11/06/14   Velvet Bathe, MD    Scheduled Meds: . hydroxychloroquine  200 mg Oral BID  . sertraline  100 mg Oral Daily  . topiramate  50 mg Oral BID   Infusions: . 0.9 % NaCl with KCl 40 mEq / L    . ciprofloxacin Stopped (01/06/17 0618)  . metronidazole Stopped (01/06/17 1044)   PRN Meds: acetaminophen **OR** acetaminophen, clonazePAM, ondansetron **OR** ondansetron (ZOFRAN) IV   Allergies as of 01/05/2017 - Review Complete 01/05/2017  Allergen Reaction Noted  . Lyrica [pregabalin] Shortness Of Breath 06/14/2016  . Sulfa antibiotics Rash 11/28/2011  . Ciprofloxacin Nausea Only 11/27/2015  . Gabapentin  11/27/2015  . Nsaids Other (See Comments) 11/04/2014  . Tolmetin  07/18/2015  . Codeine Rash 11/28/2011  . Humira [adalimumab] Rash 01/05/2017  . Morphine and related Rash 11/28/2011  . Penicillins Rash 10/25/2012    Family History  Problem Relation Age of Onset  . Colon polyps Maternal Grandmother   . Lung cancer Maternal Grandmother   . Liver disease Father        agent orange  . Diabetes Maternal Grandfather   . Heart disease Maternal Grandfather   . Hypertension Maternal Grandfather   . Colon cancer Maternal Uncle        great uncle  . Breast cancer Neg Hx     Social History   Social History  . Marital status: Divorced    Spouse name: N/A  . Number of children: 1  . Years of education: N/A   Occupational History  . work from home [place] Topics  . Smoking status: Former Smoker    Types: Cigarettes    Quit date: 03/09/1999  . Smokeless tobacco: Never Used  . Alcohol use No  . Drug use: No  . Sexual activity: Yes    Partners: Male    Birth control/ protection: Surgical   Other Topics Concern  . Not on file   Social History Narrative  . No narrative on file    REVIEW OF SYSTEMS: Constitutional:   Generally does not suffer from weakness but Saturday night did have malaise ENT:  Patient says her lupus causes ulcers in her nose which occasionally bleed and a mild-to-moderate manner. Pulm:  No shortness of breath or cough. CV:  No palpitations, no LE edema. No chest pain GU:  No hematuria, no frequency. GI:  Per HPI.  Denies dysphagia. Heme:  No unusual bleeding/bruising.   Transfusions:  No previous transfusions  Neuro:  No headaches, no peripheral tingling or numbness Derm:  No itching, no rash or sores.  Endocrine:  No sweats or chills.  No polyuria or dysuria Immunization: reviewed Travel:  None beyond local counties in last few months.    PHYSICAL EXAM: Vital signs in last 24 hours: Vitals:   01/05/17 1941 01/06/17 0601  BP: 114/77 117/69  Pulse: 64 67  Resp: 20 18  Temp: 99.2 F (37.3 C) 98.9 F (37.2 C)  SpO2: 97% 99%   Wt Readings from Last 3 Encounters:  01/05/17 101.6 kg (224 lb)  12/04/16 103.9 kg (229 lb)  10/10/16 106.4 kg (234 lb 8 oz)    General: pleasant, obese.  Nontoxic-appearing Head:  No facial asymmetry or swelling.  Eyes:  No scleral icterus, no conjunctival pallor.  EOMI Ears:  Not hard of hearing.  Nose:  No congestion or discharge Mouth:  Tongue midline.  Oral mucosa moist and clear.  Good dentition. Neck:  No adenopathy, no JVD, no TMJ.  No masses Lungs:  Clear to auscultation and percussion bilaterally.  No dyspnea or cough. Heart: RRR.  No MRG.  S1, S2 present Abdomen:  Soft.  Diffuse mild tenderness in all four quadrants.  No masses, no HSM, no hernias, no bruits..   Rectal: deferred.  Did see bloody, mucoid stool in the "hat" receptacle in the commode. Musc/Skeltl: no joint erythema, swelling or gross deformities. Extremities:  CC E.  Neurologic:  Oriented 3.  Good historian Skin:  No rashes, sores. Tattoos:  One on the upper trunk Nodes:  No cervical adenopathy   Psych:  Cooperative, pleasant.  Affect engaged  Intake/Output from  previous day: 09/23 0701 - 09/24 0700 In: 2195.8 [I.V.:1045.8; IV Piggyback:1150] Out: -  Intake/Output this shift: No intake/output data recorded.  LAB RESULTS:  Recent Labs  01/05/17 2005 01/05/17 2338 01/06/17 0249  WBC 7.5 7.8 6.6  HGB 11.9* 11.4* 11.5*  HCT 37.4 35.4* 36.4  PLT 120* 122* 123*   BMET Lab Results  Component Value Date   NA 143 01/06/2017   NA 140 01/05/2017   NA 143 12/04/2016   K 3.2 (L) 01/06/2017   K 2.7 (LL) 01/05/2017   K 3.6 12/04/2016   CL 113 (H) 01/06/2017   CL 106 01/05/2017   CL 108 12/04/2016   CO2 23 01/06/2017   CO2 24 01/05/2017   CO2 25 12/04/2016   GLUCOSE 88 01/06/2017   GLUCOSE 129 (H) 01/05/2017   GLUCOSE 110 (H) 12/04/2016   BUN 10 01/06/2017   BUN 17 01/05/2017   BUN 20 12/04/2016   CREATININE 0.70 01/06/2017   CREATININE 0.71 01/05/2017   CREATININE 0.92 12/04/2016   CALCIUM 8.0 (L) 01/06/2017   CALCIUM 9.2 01/05/2017   CALCIUM 9.4 12/04/2016   LFT  Recent Labs  01/05/17 1305  PROT 6.8  ALBUMIN 4.1  AST 22  ALT 18  ALKPHOS 65  BILITOT 0.5   PT/INR No results found for: INR, PROTIME Hepatitis Panel No results for input(s): HEPBSAG, HCVAB, HEPAIGM, HEPBIGM in the last 72 hours. C-Diff No components found for: CDIFF Lipase     Component Value Date/Time   LIPASE 42 01/05/2017 1305    Drugs of Abuse     Component Value Date/Time   LABOPIA NONE DETECTED 11/04/2014 1714   COCAINSCRNUR NONE DETECTED 11/04/2014 1714   LABBENZ NONE DETECTED 11/04/2014 1714   AMPHETMU NONE DETECTED 11/04/2014 1714   THCU NONE DETECTED 11/04/2014 1714   LABBARB NONE DETECTED 11/04/2014  [place]: Ct Abdomen Pelvis W Contrast  Result Date: 01/05/2017 CLINICAL DATA:  Diarrhea and bloody stools with abdominal pain. Some nausea. History of lupus. EXAM: CT ABDOMEN AND PELVIS WITH CONTRAST TECHNIQUE: Multidetector CT imaging of the abdomen and pelvis was performed using the standard protocol following bolus  administration of intravenous contrast. CONTRAST:  156m ISOVUE-300 IOPAMIDOL (ISOVUE-300) INJECTION 61% COMPARISON:  08/11/2015 and 04/22/2010 FINDINGS: Lower chest: Minimal bibasilar atelectasis. Hepatobiliary: Within normal. Pancreas: Within normal. Spleen: Within normal. Adrenals/Urinary Tract: Adrenal glands are normal. Kidneys are normal in size without hydronephrosis or nephrolithiasis. Ureters and bladder are normal. Stomach/Bowel: Stomach and small bowel are normal. Appendix is normal. There is wall thickening with very minimal adjacent inflammatory change of the pericolonic fat involving the descending colon likely mild acute colitis. Minimal wall thickening of the hepatic flexure. Vascular/Lymphatic: Minimal calcified plaque over the distal abdominal aorta. No adenopathy. Reproductive: Previous hysterectomy. Other: No significant free fluid.  No free peritoneal air. Musculoskeletal: Minimal degenerate change of the spine. IMPRESSION: Mild wall thickening with subtle adjacent inflammatory change involving the descending colon compatible with a mild acute colitis likely of infectious or inflammatory nature. Electronically Signed   By: DMarin OlpM.D.   On: 01/05/2017 15:44      IMPRESSION:   *  Descending colitis.  R/o ischemic colitis vs infectious.  Acutely having bloody diarrhea but in recent weeks having sub 24 hour episodes abdominal pain with watery diarrhea.  *  Lupus.  Sjogren's syndrome.  Psoriatic arthritis.  On biologic therapy along with plaque renal.  *  Thrombocytopenia.  Noncritical.  Issue dates back to 2010.    PLAN:     *  Flexible sigmoidoscopy this afternoon.  Tapwater enema beforehand.  Patient does not feel she would be able to tolerate a full bowel prep as would be required for colonoscopy.     SAzucena Freed 01/06/2017, 11:08 AM Pager: [phone]

## 2017-01-06 NOTE — Op Note (Signed)
[facility] Patient Name: Melinda Porter Procedure Date: 01/06/2017 MRN: [id number] Attending MD: Carlota Raspberry. Armbruster MD, MD Date of Birth: [birth year] CSN: [id number] Age: 46 Admit Type: Inpatient Procedure:                Flexible Sigmoidoscopy Indications:              Hematochezia, abdominal pain, abnormal CT of the GI                            tract with descending colitis Providers:                Remo Lipps P. Armbruster MD, MD, Burtis Junes, RN, Cletis [place], Technician Referring MD:              Medicines:                Fentanyl 100 micrograms IV, Midazolam 10 mg IV Complications:            No immediate complications. Estimated blood loss:                            Minimal. Estimated Blood Loss:     Estimated blood loss was minimal. Procedure:                Pre-Anesthesia Assessment:                           - Prior to the procedure, a History and Physical                            was performed, and patient medications and                            allergies were reviewed. The patient's tolerance of                            previous anesthesia was also reviewed. The risks                            and benefits of the procedure and the sedation                            options and risks were discussed with the patient.                            All questions were answered, and informed consent                            was obtained. Prior Anticoagulants: The patient has                            taken no previous anticoagulant or antiplatelet  agents. ASA Grade Assessment: II - A patient with                            mild systemic disease. After reviewing the risks                            and benefits, the patient was deemed in                            satisfactory condition to undergo the procedure.                           After obtaining informed consent, the scope was        passed under direct vision. The EC-[id number] ([id number])                            scope was introduced through the anus and advanced                            to the the left transverse colon. The flexible                            sigmoidoscopy was accomplished without difficulty.                            The patient tolerated the procedure well. The                            quality of the bowel preparation was adequate. Scope In: Scope Out: Findings:      The perianal and digital rectal examinations were normal.      Moderate inflammation characterized by congestion (edema), erythema,       friability and shallow ulcerations was found in the descending colon.       Biopsies were taken with a cold forceps for histology.      The exam was otherwise without abnormality. The descending colon was       traversed and the proximal transverse colon seemed normal. Impression:               - Moderate inflammation was found in the descending                            colon. Grossly consistent with infectious versus                            ischemic colitis, while Crohn's diseases seems less                            likely. Biopsied.                           - The examination was otherwise normal. Moderate Sedation:      Moderate (conscious) sedation was administered by the endoscopy nurse       and supervised by the endoscopist. The following parameters were  monitored: oxygen saturation, heart rate, blood pressure, and response       to care. Total physician intraservice time was 19 minutes. Recommendation:           - Return patient to hospital [facility] for ongoing care.                           - Advance diet as tolerated.                           - Continue present medications (Ciprofloxacin,                            Flagyl)                           - Await pathology results.                           - Please send stool to lab for GI pathogen panel if                             not already done                           - GI service will continue to follow Procedure Code(s):        --- Professional ---                           [phone], Sigmoidoscopy, flexible; with biopsy, single                            or multiple                           99152, Moderate sedation services provided by the                            same physician or other qualified health care                            professional performing the diagnostic or                            therapeutic service that the sedation supports,                            requiring the presence of an independent trained                            observer to assist in the monitoring of the                            patient's level of consciousness and physiological                            status;  initial 15 minutes of intraservice time,                            patient age 100 years or older Diagnosis Code(s):        --- Professional ---                           K52.9, Noninfective gastroenteritis and colitis,                            unspecified                           K92.1, Melena (includes Hematochezia)                           R93.3, Abnormal findings on diagnostic imaging of                            other parts of digestive tract CPT copyright 2016 American Medical Association. All rights reserved. The codes documented in this report are preliminary and upon coder review may  be revised to meet current compliance requirements. Remo Lipps P. Armbruster MD, MD 01/06/2017 3:32:50 PM This report has been signed electronically. Number of Addenda: 0

## 2017-01-07 ENCOUNTER — Other Ambulatory Visit: Payer: Self-pay | Admitting: Family Medicine

## 2017-01-07 ENCOUNTER — Encounter (HOSPITAL_COMMUNITY): Payer: Self-pay | Admitting: Gastroenterology

## 2017-01-07 DIAGNOSIS — K529 Noninfective gastroenteritis and colitis, unspecified: Secondary | ICD-10-CM | POA: Diagnosis not present

## 2017-01-07 DIAGNOSIS — K921 Melena: Secondary | ICD-10-CM | POA: Diagnosis not present

## 2017-01-07 LAB — CBC
HCT: 36.8 % (ref 36.0–46.0)
Hemoglobin: 11.6 g/dL — ABNORMAL LOW (ref 12.0–15.0)
MCH: 24.3 pg — ABNORMAL LOW (ref 26.0–34.0)
MCHC: 31.5 g/dL (ref 30.0–36.0)
MCV: 77.1 fL — ABNORMAL LOW (ref 78.0–100.0)
PLATELETS: 127 10*3/uL — AB (ref 150–400)
RBC: 4.77 MIL/uL (ref 3.87–5.11)
RDW: 16.2 % — AB (ref 11.5–15.5)
WBC: 7 10*3/uL (ref 4.0–10.5)

## 2017-01-07 LAB — GLUCOSE, CAPILLARY
GLUCOSE-CAPILLARY: 100 mg/dL — AB (ref 65–99)
GLUCOSE-CAPILLARY: 113 mg/dL — AB (ref 65–99)
GLUCOSE-CAPILLARY: 86 mg/dL (ref 65–99)

## 2017-01-07 MED ORDER — FAMOTIDINE 20 MG PO TABS: 20.0000 mg | ORAL_TABLET | Freq: Every day | ORAL | 1 refills | Status: DC | PRN

## 2017-01-07 MED ORDER — CIPROFLOXACIN HCL 500 MG PO TABS: 500.0000 mg | ORAL_TABLET | Freq: Two times a day (BID) | ORAL | 0 refills | Status: AC

## 2017-01-07 MED ORDER — METRONIDAZOLE 500 MG PO TABS: 500.0000 mg | ORAL_TABLET | Freq: Three times a day (TID) | ORAL | 0 refills | Status: AC

## 2017-01-07 NOTE — Progress Notes (Signed)
Progress Note   Subjective  Patient feeling much better today. No further bleeding, last episode of passing blood yesterday morning. Abdominal pain improved "80%" so far, overall doing much better.   Objective   Vital signs in last 24 hours: Temp:  [98.1 F (36.7 C)-98.5 F (36.9 C)] 98.3 F (36.8 C) (09/25 0438) Pulse Rate:  [67-83] 79 (09/25 0438) Resp:  [11-25] 18 (09/25 0438) BP: (110-138)/(63-97) 123/86 (09/25 0438) SpO2:  [94 %-99 %] 98 % (09/25 0438) Weight:  [224 lb 13.9 oz (102 kg)] 224 lb 13.9 oz (102 kg) (09/25 0500) Last BM Date: 01/05/17 General:    white female in NAD Heart:  Regular rate and rhythm; no murmurs Lungs: Respirations even and unlabored, lungs CTA bilaterally Abdomen:  Soft, mild LLQ TTP without peritoneal signs,  nondistended.  Extremities:  Without edema. Neurologic:  Alert and oriented,  grossly normal neurologically. Psych:  Cooperative. Normal mood and affect.  Intake/Output from previous day: 09/24 0701 - 09/25 0700 In: 754.6 [P.O.:240; I.V.:414.6; IV Piggyback:100] Out: -  Intake/Output this shift: No intake/output data recorded.  Lab Results:  Recent Labs  01/05/17 2338 01/06/17 0249 01/07/17 0356  WBC 7.8 6.6 7.0  HGB 11.4* 11.5* 11.6*  HCT 35.4* 36.4 36.8  PLT 122* 123* 127*   BMET  Recent Labs  01/05/17 1305 01/06/17 0424  NA 140 143  K 2.7* 3.2*  CL 106 113*  CO2 24 23  GLUCOSE 129* 88  BUN 17 10  CREATININE 0.71 0.70  CALCIUM 9.2 8.0*   LFT  Recent Labs  01/05/17 1305  PROT 6.8  ALBUMIN 4.1  AST 22  ALT 18  ALKPHOS 65  BILITOT 0.5   PT/INR No results for input(s): LABPROT, INR in the last 72 hours.  Studies/Results: Ct Abdomen Pelvis W Contrast  Result Date: 01/05/2017 CLINICAL DATA:  Diarrhea and bloody stools with abdominal pain. Some nausea. History of lupus. EXAM: CT ABDOMEN AND PELVIS WITH CONTRAST TECHNIQUE: Multidetector CT imaging of the abdomen and pelvis was performed using the  standard protocol following bolus administration of intravenous contrast. CONTRAST:  141m ISOVUE-300 IOPAMIDOL (ISOVUE-300) INJECTION 61% COMPARISON:  08/11/2015 and 04/22/2010 FINDINGS: Lower chest: Minimal bibasilar atelectasis. Hepatobiliary: Within normal. Pancreas: Within normal. Spleen: Within normal. Adrenals/Urinary Tract: Adrenal glands are normal. Kidneys are normal in size without hydronephrosis or nephrolithiasis. Ureters and bladder are normal. Stomach/Bowel: Stomach and small bowel are normal. Appendix is normal. There is wall thickening with very minimal adjacent inflammatory change of the pericolonic fat involving the descending colon likely mild acute colitis. Minimal wall thickening of the hepatic flexure. Vascular/Lymphatic: Minimal calcified plaque over the distal abdominal aorta. No adenopathy. Reproductive: Previous hysterectomy. Other: No significant free fluid.  No free peritoneal air. Musculoskeletal: Minimal degenerate change of the spine. IMPRESSION: Mild wall thickening with subtle adjacent inflammatory change involving the descending colon compatible with a mild acute colitis likely of infectious or inflammatory nature. Electronically Signed   By: DMarin OlpM.D.   On: 01/05/2017 15:44       Assessment / Plan:   46y/o female with history of SLE, psoriatic arthritis, Sjogren's, HTN, admitted with acute onset LLQ abdominal pain followed by bloody diarrhea. CT showed descending colitis. Flex sig yesterday confirmed colitis in this area - suspicious for ischemic colitis versus infectious colitis. Her history is more c/w ischemic colitis. On antibiotics empirically, stool studies pending for infectious etiology. Overall, she is significantly improved since admission, pain much better.  If  this does represent ischemic colitis, on review of her medication list, the diuretic she is on could put her at risk for this. I don't see any reports of ischemic colitis related to Cosentyx /  Plaquenil listed. She denies NSAID use.   Recommend the following: - advance diet to soft now, would continue bland diet for the next few days - okay for discharge later today if she tolerates a diet - continue antibiotic course (Cipro / Flagyl) on discharge for 7-10 days - await pathology results and stool testing - hold diuretic as outpatient for now - our office will coordinate follow up with her in the next few weeks  Call with questions.  [place], MD [facility] Pager [phone]

## 2017-01-07 NOTE — Progress Notes (Signed)
Pt discharged home with mother in stable condition. Released from unit via wheelchair. Discharge instructions given. Script sent to pharmacy of choice. Pt verbalized understanding. No immediate questions or concerns at this time.

## 2017-01-13 NOTE — Discharge Summary (Signed)
[facility] Discharge Summary   Patient: Melinda Porter XLK:[id number]   PCP: Elby Beck, FNP DOB: 31-Dec-1970   Date of admission: 01/05/2017   Date of discharge: 01/07/2017    Discharge Diagnoses:  Principal Problem:   Colitis Active Problems:   DM (diabetes mellitus) ([place])   Psoriatic arthritis ([place])   Lower GI bleed   Admitted From: [facility] Disposition:  home  Recommendations for Outpatient Follow-up:  1.  please follow-up with PCP in 1-2 weeks 2. Continue to follow up with gastroenterology  Follow-up Information    Elby Beck, FNP. Schedule an appointment as soon as possible for a visit in 1 week(s).   Specialties:  Nurse Practitioner, Family Medicine Contact information: [place] [address] [phone]        Yetta Flock, MD Follow up.   Specialty:  Gastroenterology Why:  office will call you for appointment.  Contact information: [address] [place] [address] [phone]          Diet recommendation: soft diet  Activity: The patient is advised to gradually reintroduce usual activities.  Discharge Condition: good  Code Status: full code  History of present illness: As per the H and P dictated on admission, "Melinda Porter is a 46 y.o. female with history of lupus, psoriatic arthritis, Sjogren's syndrome, hypertension presents to the ER after patient had bloody bowel movement. Patient states last night patient had multiple bouts of diarrhea a few hours after eating chicken fillet sandwich. This morning patient started experiencing bloody bowel movement. There were at least 3 episodes. Denies any vomiting. Patient did take antibiotics 2 months ago for tick bite."  Hospital Course:  Summary of her active problems in the hospital is as following. Principal Problem:   Colitis with lower GI bleed Continue Cipro/Flagyl and Pepcid. Follow-up stool studies. Gastroenterologist consulted and  proceeded with flexible sigmoidoscopy which showed inflammation concerning for infectious versus inflammatory process. Hgb stable. Patient was able to tolerate oral diet without any abdominal pain.  Active Problems:   Thrombocytopenia Mild, monitor.    Hypokalemia replaced    Hypertension Continue to hold diuretics.    DM (diabetes mellitus) ([place]) Monitor CBGs.    Psoriatic arthritis (HCC)/lupus/Sjogren syndrome Managed with hydroxychloroquine and Cosentyxevery month. Recommend to hold immunosuppressant until seen by GI as well as PCP.    Obesity Body mass index is 38.45 kg/m.  All other chronic medical condition were stable during the hospitalization.  Patient was ambulatory without any assistance. On the day of the discharge the patient's vitals were stable, and no other acute medical condition were reported by patient. the patient was felt safe to be discharge at home with family.  Procedures and Results:  Flexible sigmoidoscopy and biopsy   Consultations:  Gastroenterology   DISCHARGE MEDICATION: Discharge Medication List as of 01/07/2017 12:14 PM    START taking these medications   Details  ciprofloxacin (CIPRO) 500 MG tablet Take 1 tablet (500 mg total) by mouth 2 (two) times daily., Starting Tue 01/07/2017, Until Mon 01/13/2017, Normal    famotidine (PEPCID) 20 MG tablet Take 1 tablet (20 mg total) by mouth daily as needed for heartburn or indigestion., Starting Tue 01/07/2017, Until Wed 01/07/2018, Normal    metroNIDAZOLE (FLAGYL) 500 MG tablet Take 1 tablet (500 mg total) by mouth 3 (three) times daily., Starting Tue 01/07/2017, Until Mon 01/13/2017, Normal      CONTINUE these medications which have NOT CHANGED   Details  Cholecalciferol 1000 units capsule Take 1 capsule (1,000 Units total) by mouth daily., Starting Fri 12/27/2016, Normal    clonazePAM (KLONOPIN) 1 MG tablet Take 1 tablet (1 mg total) by mouth 3 (three) times daily as needed. For anxiety,  Starting Fri 12/27/2016, Phone In    cyclobenzaprine (FLEXERIL) 10 MG tablet Take 1 tablet (10 mg total) by mouth 3 (three) times daily as needed for muscle spasms., Starting Fri 08/02/2016, Normal    hydrocortisone cream 1 % Apply 1 application topically 2 (two) times daily as needed. For itching, Until Discontinued, Historical Med    hydroxychloroquine (PLAQUENIL) 200 MG tablet Take 200 mg by mouth 2 (two) times daily., Until Discontinued, Historical Med    nystatin (MYCOSTATIN/NYSTOP) 100000 UNIT/GM POWD For use as recommended per package insert to affected areas (under breast), Print    sertraline (ZOLOFT) 100 MG tablet Take 1 tablet (100 mg total) by mouth daily., Starting Fri 12/27/2016, Normal    topiramate (TOPAMAX) 50 MG tablet Take 50 mg by mouth 2 (two) times daily., Until Discontinued, Historical Med      STOP taking these medications     COSENTYX 300 DOSE 150 MG/ML SOSY      doxycycline (VIBRA-TABS) 100 MG tablet      esomeprazole (NEXIUM) 40 MG capsule      triamterene-hydrochlorothiazide (MAXZIDE-25) 37.5-25 MG tablet        Allergies  Allergen Reactions  . Lyrica [Pregabalin] Shortness Of Breath  . Sulfa Antibiotics Rash  . Ciprofloxacin Nausea Only  . Gabapentin     Stroke like symptoms  . Nsaids Other (See Comments)    Liver problems- can't take tylenol, ibuprofen etc.  . Tolmetin     Other reaction(s): Other (See Comments) Liver problems- can't take tylenol, ibuprofen etc.  . Codeine Rash  . Humira [Adalimumab] Rash    Worsening of Lupus rash.  . Morphine And Related Rash    "Wired up"  . Penicillins Rash    Has patient had a PCN reaction causing immediate rash, facial/tongue/throat swelling, SOB or lightheadedness with hypotension: NO Has patient had a PCN reaction causing severe rash involving mucus membranes or skin necrosis: NO Has patient had a PCN reaction that required hospitalization NO Has patient had a PCN reaction occurring within the last 10  years: NO If all of the above answers are "NO", then may proceed with Cephalosporin use.   Discharge Instructions    Diet - low sodium heart healthy    Complete by:  As directed    Discharge instructions    Complete by:  As directed    It is important that you read following instructions as well as go over your medication list with RN to help you understand your care after this hospitalization.  Discharge Instructions: Please follow-up with PCP in one week  Please request your primary care physician to go over all Hospital Tests and Procedure/Radiological results at the follow up,  Please get all Hospital records sent to your PCP by signing hospital release before you go home.   Do not take more than prescribed Pain, Sleep and Anxiety Medications. You were cared for by a hospitalist during your hospital stay. If you have any questions about your discharge medications or the care you received while you were in the hospital after you are discharged, you can call the unit and ask to speak with the hospitalist on call if the hospitalist that took care of you is not available.  Once you are discharged,  your primary care physician will handle any further medical issues. Please note that NO REFILLS for any discharge medications will be authorized once you are discharged, as it is imperative that you return to your primary care physician (or establish a relationship with a primary care physician if you do not have one) for your aftercare needs so that they can reassess your need for medications and monitor your lab values. You Must read complete instructions/literature along with all the possible adverse reactions/side effects for all the Medicines you take and that have been prescribed to you. Take any new Medicines after you have completely understood and accept all the possible adverse reactions/side effects. Wear Seat belts while driving. If you have smoked or chewed Tobacco in the last 2 yrs  please stop smoking and/or stop any Recreational drug use.   Increase activity slowly    Complete by:  As directed      Discharge Exam: Filed Weights   01/05/17 1213 01/07/17 0500  Weight: 101.6 kg (224 lb) 102 kg (224 lb 13.9 oz)   Vitals:   01/06/17 2048 01/07/17 0438  BP: 125/76 123/86  Pulse: 67 79  Resp: 18 18  Temp: 98.4 F (36.9 C) 98.3 F (36.8 C)  SpO2: 99% 98%   General: Appear in no distress, no Rash; Oral Mucosa moist. Cardiovascular: S1 and S2 Present, no Murmur, no JVD Respiratory: Bilateral Air entry present and Clear to Auscultation, no Crackles, no wheezes Abdomen: Bowel Sound present, Soft and no tenderness Extremities: no Pedal edema, no calf tenderness Neurology: Grossly no focal neuro deficit.  The results of significant diagnostics from this hospitalization (including imaging, microbiology, ancillary and laboratory) are listed below for reference.    Significant Diagnostic Studies: Ct Abdomen Pelvis W Contrast  Result Date: 01/05/2017 CLINICAL DATA:  Diarrhea and bloody stools with abdominal pain. Some nausea. History of lupus. EXAM: CT ABDOMEN AND PELVIS WITH CONTRAST TECHNIQUE: Multidetector CT imaging of the abdomen and pelvis was performed using the standard protocol following bolus administration of intravenous contrast. CONTRAST:  129m ISOVUE-300 IOPAMIDOL (ISOVUE-300) INJECTION 61% COMPARISON:  08/11/2015 and 04/22/2010 FINDINGS: Lower chest: Minimal bibasilar atelectasis. Hepatobiliary: Within normal. Pancreas: Within normal. Spleen: Within normal. Adrenals/Urinary Tract: Adrenal glands are normal. Kidneys are normal in size without hydronephrosis or nephrolithiasis. Ureters and bladder are normal. Stomach/Bowel: Stomach and small bowel are normal. Appendix is normal. There is wall thickening with very minimal adjacent inflammatory change of the pericolonic fat involving the descending colon likely mild acute colitis. Minimal wall thickening of the  hepatic flexure. Vascular/Lymphatic: Minimal calcified plaque over the distal abdominal aorta. No adenopathy. Reproductive: Previous hysterectomy. Other: No significant free fluid.  No free peritoneal air. Musculoskeletal: Minimal degenerate change of the spine. IMPRESSION: Mild wall thickening with subtle adjacent inflammatory change involving the descending colon compatible with a mild acute colitis likely of infectious or inflammatory nature. Electronically Signed   By: DMarin OlpM.D.   On: 01/05/2017 15:44    Microbiology: No results found for this or any previous visit (from the past 240 hour(s)).   Labs: CBC:  Recent Labs Lab 01/07/17 0356  WBC 7.0  HGB 11.6*  HCT 36.8  MCV 77.1*  PLT 127*   CBG:  Recent Labs Lab 01/06/17 1206 01/07/17 0023 01/07/17 0543 01/07/17 1155  GLUCAP 84 86 113* 100*   Time spent: 35 minutes  Signed:  Natally Ribera  [facility] 01/07/2017 , 6:55 AM

## 2017-01-17 ENCOUNTER — Encounter: Payer: Self-pay | Admitting: Physician Assistant

## 2017-01-17 DIAGNOSIS — L4050 Arthropathic psoriasis, unspecified: Secondary | ICD-10-CM | POA: Diagnosis not present

## 2017-01-17 DIAGNOSIS — R768 Other specified abnormal immunological findings in serum: Secondary | ICD-10-CM | POA: Diagnosis not present

## 2017-01-22 ENCOUNTER — Ambulatory Visit: Payer: 59 | Admitting: Physician Assistant

## 2017-02-01 ENCOUNTER — Encounter: Payer: Self-pay | Admitting: Family Medicine

## 2017-02-03 NOTE — Telephone Encounter (Signed)
Copied from [place] #[id number]. Topic: Inquiry >> Feb 03, 2017  2:59 PM Melinda Porter I, NT wrote: Reason for CRM: Rx refills

## 2017-02-04 NOTE — Telephone Encounter (Signed)
Copied from [place] #[id number]. Topic: Quick Communication - See Telephone Encounter >> Feb 04, 2017  9:24 AM Ahmed Prima L wrote: CRM for notification. See Telephone encounter for:  02/04/17. Needs refill on clonazepam & esomeprazole

## 2017-02-05 ENCOUNTER — Other Ambulatory Visit: Payer: Self-pay | Admitting: Family Medicine

## 2017-02-05 ENCOUNTER — Encounter: Payer: Self-pay | Admitting: Family Medicine

## 2017-02-05 ENCOUNTER — Telehealth: Payer: Self-pay | Admitting: *Deleted

## 2017-02-05 DIAGNOSIS — F419 Anxiety disorder, unspecified: Secondary | ICD-10-CM

## 2017-02-05 MED ORDER — ESOMEPRAZOLE MAGNESIUM 40 MG PO CPDR: 40.0000 mg | DELAYED_RELEASE_CAPSULE | Freq: Every day | ORAL | 3 refills | Status: DC

## 2017-02-05 MED ORDER — CLONAZEPAM 1 MG PO TABS: 1.0000 mg | ORAL_TABLET | Freq: Three times a day (TID) | ORAL | 0 refills | Status: DC | PRN

## 2017-02-05 NOTE — Telephone Encounter (Signed)
Prescription Faxed to Pharmacy.

## 2017-02-05 NOTE — Telephone Encounter (Signed)
Sent esomeprazole to her pharmacy.

## 2017-02-05 NOTE — Telephone Encounter (Signed)
I notified pt that Klonopin was renewed.  We did not see where she was taking Nexium in her record so clarification was obtained.  Pt said she was taking it while in the hospital.   The dose is 47m.   Is it ok to order this?

## 2017-02-26 ENCOUNTER — Ambulatory Visit: Payer: 59 | Admitting: Family Medicine

## 2017-02-26 VITALS — BP 126/68 | HR 78 | Temp 98.0°F | Wt 231.5 lb

## 2017-02-26 DIAGNOSIS — F419 Anxiety disorder, unspecified: Secondary | ICD-10-CM | POA: Diagnosis not present

## 2017-02-26 DIAGNOSIS — R7309 Other abnormal glucose: Secondary | ICD-10-CM

## 2017-02-26 DIAGNOSIS — R319 Hematuria, unspecified: Secondary | ICD-10-CM

## 2017-02-26 DIAGNOSIS — R300 Dysuria: Secondary | ICD-10-CM

## 2017-02-26 LAB — URINALYSIS, MICROSCOPIC ONLY

## 2017-02-26 LAB — POCT URINALYSIS DIPSTICK
BILIRUBIN UA: NEGATIVE
GLUCOSE UA: NEGATIVE
Ketones, UA: NEGATIVE
Nitrite, UA: NEGATIVE
Protein, UA: NEGATIVE
RBC UA: NEGATIVE
UROBILINOGEN UA: 0.2 U/dL
pH, UA: 6 (ref 5.0–8.0)

## 2017-02-26 LAB — HEMOGLOBIN A1C: HEMOGLOBIN A1C: 5.5 % (ref 4.6–6.5)

## 2017-02-26 MED ORDER — CLONAZEPAM 1 MG PO TABS: 1.0000 mg | ORAL_TABLET | Freq: Three times a day (TID) | ORAL | 1 refills | Status: DC | PRN

## 2017-02-26 MED ORDER — SERTRALINE HCL 100 MG PO TABS: 150.0000 mg | ORAL_TABLET | Freq: Every day | ORAL | 1 refills | Status: DC
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
## 2017-02-26 NOTE — Progress Notes (Signed)
Yell

## 2017-02-26 NOTE — Patient Instructions (Addendum)
Follow up in 6 months  Please increase your zoloft (sertraline) to 1 1/2 tablets nightly

## 2017-02-27 LAB — URINE CULTURE
MICRO NUMBER: 81285097
SPECIMEN QUALITY: ADEQUATE

## 2017-03-02 ENCOUNTER — Encounter: Payer: Self-pay | Admitting: Family Medicine

## 2017-03-04 ENCOUNTER — Encounter: Payer: Self-pay | Admitting: Family Medicine

## 2017-03-17 ENCOUNTER — Ambulatory Visit: Payer: 59 | Admitting: Internal Medicine

## 2017-03-17 ENCOUNTER — Encounter (HOSPITAL_COMMUNITY): Payer: Self-pay

## 2017-03-17 ENCOUNTER — Encounter: Payer: Self-pay | Admitting: Internal Medicine

## 2017-03-17 VITALS — BP 130/90 | HR 80 | Ht 64.0 in | Wt 231.4 lb

## 2017-03-17 DIAGNOSIS — R935 Abnormal findings on diagnostic imaging of other abdominal regions, including retroperitoneum: Secondary | ICD-10-CM | POA: Diagnosis not present

## 2017-03-17 DIAGNOSIS — K760 Fatty (change of) liver, not elsewhere classified: Secondary | ICD-10-CM | POA: Diagnosis not present

## 2017-03-17 DIAGNOSIS — K581 Irritable bowel syndrome with constipation: Secondary | ICD-10-CM

## 2017-03-17 DIAGNOSIS — K5010 Crohn's disease of large intestine without complications: Secondary | ICD-10-CM

## 2017-03-17 NOTE — Progress Notes (Signed)
HISTORY OF PRESENT ILLNESS:  Melinda Porter is a 46 y.o. female with hypertension, hyperlipidemia, lupus, arthritis, anxiety, depression, and PTSD. The patient is sent today following a hospitalization for ischemic colitis. I saw the patient back in January 2016 when she underwent upper endoscopy to evaluate abdominal pain. The examination was normal. Testing for Helicobacter pylori was negative. Subsequent abdominal ultrasound revealed hepatic steatosis. No other abnormalities. Recent history is that of abdominal pain with bloody diarrhea for which she presented to the hospital the latter part of September. CT scan revealed mild segmental colitis of the descending colon consistent with ischemia or infection. Flexible sigmoidoscopy was performed by Dr. Havery Porter. This revealed segmental colitis. Biopsies were more consistent with ischemia. Patient presents today for follow-up. She has been off her blood pressure pill is recommended. Her hemoglobin was very stable throughout her hospitalization. Discharged 11.6 hemoglobin. Patient tells me that she has had no further bleeding. No severe pain as previous. She tells me that she carries the diagnosis of irritable bowel syndrome with a tendency toward constipation. Different types of foods tend to upset her stomach. She's been less constipated since leaving the hospital. Recent problems with nonspecific nausea. She also has a history of fibromyalgia  REVIEW OF SYSTEMS:  All non-GI ROS negative except for anxiety, arthritis, back pain, depression, fatigue, hematuria, fevers, headaches, ankle swelling  Past Medical History:  Diagnosis Date  . Anxiety   . Arthritis   . Depression   . Endometriosis   . Hiatal hernia   . Hyperlipemia   . Hypertension   . Lupus   . PTSD (post-traumatic stress disorder)     Past Surgical History:  Procedure Laterality Date  . CESAREAN SECTION  1997  . FLEXIBLE SIGMOIDOSCOPY N/A 01/06/2017   Procedure: FLEXIBLE  SIGMOIDOSCOPY;  Surgeon: Melinda Flock, MD;  Location: [facility];  Service: Gastroenterology;  Laterality: N/A;  . OOPHORECTOMY Bilateral   . PARTIAL HYSTERECTOMY  07/2009  . TEMPOROMANDIBULAR JOINT SURGERY  2009   right side  . TONSILLECTOMY  2009    Social History Melinda Porter  reports that she quit smoking about 18 years ago. Her smoking use included cigarettes. she has never used smokeless tobacco. She reports that she does not drink alcohol or use drugs.  family history includes Colon cancer in her maternal uncle; Colon polyps in her maternal grandmother; Diabetes in her maternal grandfather; Heart disease in her maternal grandfather; Hypertension in her maternal grandfather; Liver disease in her father; Lung cancer in her maternal grandmother.  Allergies  Allergen Reactions  . Lyrica [Pregabalin] Shortness Of Breath  . Sulfa Antibiotics Rash  . Ciprofloxacin Nausea Only  . Gabapentin     Stroke like symptoms  . Nsaids Other (See Comments)    Liver problems- can't take tylenol, ibuprofen etc.  . Tolmetin     Other reaction(s): Other (See Comments) Liver problems- can't take tylenol, ibuprofen etc.  . Codeine Rash  . Humira [Adalimumab] Rash    Worsening of Lupus rash.  . Morphine And Related Rash    "Wired up"  . Penicillins Rash    Has patient had a PCN reaction causing immediate rash, facial/tongue/throat swelling, SOB or lightheadedness with hypotension: NO Has patient had a PCN reaction causing severe rash involving mucus membranes or skin necrosis: NO Has patient had a PCN reaction that required hospitalization NO Has patient had a PCN reaction occurring within the last 10 years: NO If all of the above answers are "NO", then may  proceed with Cephalosporin use.       PHYSICAL EXAMINATION: Vital signs: BP 130/90   Pulse 80   Ht 5' 4"  (1.626 m)   Wt 231 lb 6 oz (105 kg)   LMP 06/23/2009   BMI 39.72 kg/m   Constitutional: Obese, generally  well-appearing, no acute distress Psychiatric: alert and oriented x3, cooperative Eyes: extraocular movements intact, anicteric, conjunctiva pink Mouth: oral pharynx moist, no lesions Neck: supple no lymphadenopathy Cardiovascular: heart regular rate and rhythm, no murmur Lungs: clear to auscultation bilaterally Abdomen: soft, nontender, nondistended, no obvious ascites, no peritoneal signs, normal bowel sounds, no organomegaly Rectal: Omitted Extremities:. no clubbing, cyanosis, or lower extremity edema bilaterally Skin: no lesions on visible extremities except for tattoos Neuro: No focal deficits. Cranial nerves intact  ASSESSMENT:  #1. Ischemic colitis. Resolved #2. Self-reported history of constipation dominant IBS #3. Multiple medical problems #4. Fatty liver on imaging  PLAN:  #1. Discussion on ischemic colitis #2. Expectant management. I have asked her to contact the office should she have recurrent or new problems with her abdomen #3. Routine screening colonoscopy around age 20 #85. Exercise and weight loss

## 2017-03-17 NOTE — Patient Instructions (Signed)
Please follow up as needed 

## 2017-03-26 ENCOUNTER — Ambulatory Visit: Payer: 59 | Admitting: Internal Medicine

## 2017-03-28 ENCOUNTER — Ambulatory Visit: Payer: 59 | Admitting: Family Medicine

## 2017-03-28 ENCOUNTER — Encounter: Payer: Self-pay | Admitting: Family Medicine

## 2017-03-28 VITALS — BP 144/90 | HR 80 | Temp 98.5°F | Wt 232.5 lb

## 2017-03-28 DIAGNOSIS — K1121 Acute sialoadenitis: Secondary | ICD-10-CM

## 2017-03-28 MED ORDER — CLINDAMYCIN HCL 300 MG PO CAPS: 300.0000 mg | ORAL_CAPSULE | Freq: Three times a day (TID) | ORAL | 0 refills | Status: DC

## 2017-03-28 NOTE — Progress Notes (Signed)
Subjective:    Patient ID: Melinda Porter, female    DOB: 11/15/1970, 46 y.o.   MRN: [id number]  HPI This is a 46 yo female who presents today with oral pain and swelling x 6 days. Noticed white area on inside of left cheek. Has been applying warm compresses. No fever or chills.  Has had month long flare of her lupus- has been to rheumatology and has follow up appointment next week. She is on hydroxychloroquine and ustekinumab. 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   Past Medical History:  Diagnosis Date  . Anxiety   . Arthritis   . Depression   . Endometriosis   . Hiatal hernia   . Hyperlipemia   . Hypertension   . Lupus   . PTSD (post-traumatic stress disorder)    Past Surgical History:  Procedure Laterality Date  . CESAREAN SECTION  1997  . FLEXIBLE SIGMOIDOSCOPY N/A 01/06/2017   Procedure: FLEXIBLE SIGMOIDOSCOPY;  Surgeon: Yetta Flock, MD;  Location: [facility];  Service: Gastroenterology;  Laterality: N/A;  . OOPHORECTOMY Bilateral   . PARTIAL HYSTERECTOMY  07/2009  . TEMPOROMANDIBULAR JOINT SURGERY  2009   right side  . TONSILLECTOMY  2009   Family History  Problem Relation Age of Onset  . Colon polyps Maternal Grandmother   . Lung cancer Maternal Grandmother   . Liver disease Father        agent orange  . Diabetes Maternal Grandfather   . Heart disease Maternal Grandfather   . Hypertension Maternal Grandfather   . Colon cancer Maternal Uncle        great uncle  . Breast cancer Neg Hx    Social History   Tobacco Use  . Smoking status: Former Smoker    Types: Cigarettes    Last attempt to quit: 03/09/1999    Years since quitting: 18.0  . Smokeless tobacco: Never Used  Substance Use Topics  . Alcohol use: No    Alcohol/week: 0.0 oz  . Drug use: No      Review of Systems Per HPI    Objective:   Physical Exam  Constitutional: She is oriented to person, place, and time. She appears well-developed and well-nourished. No  distress.  HENT:  Mouth/Throat:    Mild swelling of left side of face.   Eyes: Conjunctivae are normal.  Cardiovascular: Normal rate.  Pulmonary/Chest: Effort normal.  Neurological: She is alert and oriented to person, place, and time.  Skin: Skin is warm and dry. She is not diaphoretic.  Psychiatric: She has a normal mood and affect. Her behavior is normal. Judgment and thought content normal.  Vitals reviewed.     BP (!) 144/90 (BP Location: Right Arm, Patient Position: Sitting, Cuff Size: Large)   Pulse 80   Temp 98.5 F (36.9 C) (Oral)   Wt 232 lb 8 oz (105.5 kg)   LMP 06/23/2009   SpO2 97%   BMI 39.91 kg/m  Wt Readings from Last 3 Encounters:  03/28/17 232 lb 8 oz (105.5 kg)  03/17/17 231 lb 6 oz (105 kg)  02/26/17 231 lb 8 oz (105 kg)       Assessment & Plan:  1. Parotitis, acute - discussed with Dr. Diona Browner - Provided written and verbal information regarding diagnosis and treatment. - she does not look toxic in office, will go ahead and cover her with antibiotic due to her immunocompromised state - RTC/ER instructions reviewed - clindamycin (CLEOCIN) 300 MG capsule; Take  1 capsule (300 mg total) by mouth 3 (three) times daily.  Dispense: 21 capsule; Refill: 0   Clarene Reamer, FNP-BC  [facility]  03/30/2017 12:42 PM

## 2017-03-28 NOTE — Patient Instructions (Signed)
Parotitis Parotitis is irritation and swelling (inflammation) of one or both of your parotid glands. These glands produce saliva. They are found on each side of your face, below and in front of your earlobes. The saliva that they produce comes out of tiny openings (ducts) inside your cheeks. Parotitis may cause sudden swelling and pain (acute parotitis). It can also cause repeated episodes of swelling and pain or continued swelling that may or may not be painful (chronic parotitis). What are the causes? Causes of this condition include:  Bacterial infections.  Viral infections, such as mumps and HIV.  Blockage (obstruction) of saliva flow through the parotid glands. This can be from a stone, scar tissue, or tumor.  Diseases that cause your body's defense system to attack salivary glands and cause inflammation (autoimmune diseases).  What increases the risk? This condition is more likely to develop in:  People who are 77 or older.  People who do not drink enough fluids (dehydration).  People who drink too much alcohol.  People who have a dry mouth.  People who have poor dental hygiene.  People who have diabetes.  People who have gout.  People who have a long-term illness.  People who have had X-ray treatments to the head and neck.  People who take certain medicines.  What are the signs or symptoms? Symptoms of this condition depend on the cause. Symptoms may include:  Swelling under and in front of the ear. This may get worse after eating.  Redness of the skin over the parotid gland.  Pain and tenderness over the parotid gland. This may get worse after eating.  Fever or chills.  Pus coming from the ducts inside the mouth.  Dry mouth.  A bad taste in the mouth.  How is this diagnosed? This condition is diagnosed with a medical history and physical exam. You may also have tests to find the cause of parotitis. These tests may include:  Doing blood tests to check  for autoimmune disease or a viral infection.  Taking a sample of fluid from the parotid gland to test for infection.  Injecting the ducts of the gland with a dye before taking X-rays (sialogram).  Having other imaging studies of the gland, including X-rays, ultrasound, MRI, or CT scan.  Checking the opening of the gland for a stone or obstruction.  Placing a needle into the gland to remove tissue for a biopsy (fine needle aspiration).  How is this treated? Treatment for this condition depends on the cause. Treatment may include:  Antibiotic medicine for a bacterial infection.  Drinking more fluids.  Removing a stone or obstruction.  Treating an underlying disease that is causing parotitis.  Surgery to drain an infection, remove a growth, or remove the whole gland (parotidectomy).  Treatment may not be needed if parotid swelling goes away with home care. Follow these instructions at home: Medicines  Take over-the-counter and prescription medicines only as told by your health care provider.  If you were prescribed an antibiotic, take it as told by your health care provider. Do not stop taking the antibiotic even if you start to feel better. Managing pain and swelling  Apply warm compresses to the affected area as told by your health care provider.  Gently massage the parotid glands as told by your health care provider. General instructions   Drink enough fluid to keep your urine clear or pale yellow.  Try sucking on sour candy. This may help to make your mouth less dry and  may stimulate the flow of saliva.  Keep your mouth clean and moist. Gargle with a salt-water mixture 3-4 times per day, or as needed. To make a salt-water mixture, completely dissolve -1 tsp of salt in 1 cup of warm water.  Maintain good oral health. ? Brush your teeth at least two times per day. ? Floss your teeth every day. ? See your dentist regularly.  Do not use tobacco products, including  cigarettes, chewing tobacco, or e-cigarettes. If you need help quitting, ask your health care provider.  Keep all follow-up visits as told by your health care provider. This is important. Contact a health care provider if:  You have a fever or chills.  You have new symptoms.  Your symptoms get worse.  Your symptoms do not improve with treatment. This information is not intended to replace advice given to you by your health care provider. Make sure you discuss any questions you have with your health care provider. Document Released: 09/21/2001 Document Revised: 09/07/2015 Document Reviewed: 08/25/2014 Elsevier Interactive Patient Education  [employer].

## 2017-03-30 ENCOUNTER — Encounter: Payer: Self-pay | Admitting: Family Medicine

## 2017-03-31 ENCOUNTER — Telehealth: Payer: Self-pay | Admitting: Family Medicine

## 2017-03-31 NOTE — Telephone Encounter (Signed)
Called patient to see how she was feeling. No answer. Left her a message to either call the office tomorrow or send me a Mychart message to let me know how she is doing.

## 2017-04-01 ENCOUNTER — Encounter: Payer: Self-pay | Admitting: Family Medicine

## 2017-04-02 ENCOUNTER — Encounter: Payer: Self-pay | Admitting: Family Medicine

## 2017-04-03 ENCOUNTER — Other Ambulatory Visit: Payer: Self-pay | Admitting: Family Medicine

## 2017-04-03 NOTE — Telephone Encounter (Signed)
Request for controlled substance

## 2017-04-03 NOTE — Telephone Encounter (Signed)
Copied from [place] [id number]. Topic: Quick Communication - Rx Refill/Question >> Apr 03, 2017 10:24 AM Yvette Rack wrote: Has the patient contacted their pharmacy? No.   (Agent: If no, request that the patient contact the pharmacy for the refill.)clonazePAM (KLONOPIN) 1 MG tablet    Preferred Pharmacy (with phone number or street name): [facility] [place], [address] [phone] (Phone) [phone] (Fax)     Agent: Please be advised that RX refills may take up to 3 business days. We ask that you follow-up with your pharmacy.

## 2017-04-03 NOTE — Telephone Encounter (Signed)
Rx is too soon

## 2017-04-04 ENCOUNTER — Encounter: Payer: Self-pay | Admitting: Family Medicine

## 2017-04-04 NOTE — Progress Notes (Signed)
Patient requests letter to have mailbox located in her house instead of across the street due to her mobility difficulties.  Letter written and she will be able to access it through my chart.

## 2017-04-04 NOTE — Telephone Encounter (Signed)
Letter was created and patient was notified via my chart.  Copied from [place] [id number]. Topic: General - Other >> Apr 03, 2017 10:26 AM Yvette Rack wrote:  Reason for CRM: patient states that she had sent an e-mail to [facility] about writing a letter for her mailbox to be moved closer to her home

## 2017-04-04 NOTE — Telephone Encounter (Signed)
Noted  

## 2017-04-07 NOTE — Telephone Encounter (Signed)
Letter sent via mychart nothing further needed at this time.

## 2017-04-09 ENCOUNTER — Ambulatory Visit: Payer: Self-pay

## 2017-04-09 ENCOUNTER — Other Ambulatory Visit: Payer: Self-pay | Admitting: Family Medicine

## 2017-04-09 ENCOUNTER — Ambulatory Visit: Payer: 59 | Admitting: Nurse Practitioner

## 2017-04-09 ENCOUNTER — Ambulatory Visit (INDEPENDENT_AMBULATORY_CARE_PROVIDER_SITE_OTHER): Payer: 59

## 2017-04-09 ENCOUNTER — Encounter: Payer: Self-pay | Admitting: Nurse Practitioner

## 2017-04-09 VITALS — BP 142/92 | HR 72 | Temp 98.0°F | Ht 64.0 in | Wt 237.0 lb

## 2017-04-09 DIAGNOSIS — I10 Essential (primary) hypertension: Secondary | ICD-10-CM | POA: Diagnosis not present

## 2017-04-09 DIAGNOSIS — R0602 Shortness of breath: Secondary | ICD-10-CM

## 2017-04-09 DIAGNOSIS — R079 Chest pain, unspecified: Secondary | ICD-10-CM

## 2017-04-09 DIAGNOSIS — F419 Anxiety disorder, unspecified: Secondary | ICD-10-CM

## 2017-04-09 DIAGNOSIS — R002 Palpitations: Secondary | ICD-10-CM

## 2017-04-09 MED ORDER — CLONAZEPAM 1 MG PO TABS: 1.0000 mg | ORAL_TABLET | Freq: Three times a day (TID) | ORAL | 1 refills | Status: DC | PRN

## 2017-04-09 NOTE — Telephone Encounter (Signed)
Pt is here in the office.

## 2017-04-09 NOTE — Telephone Encounter (Signed)
Phone call from pt. With c/o heart palpitations over past 1.5 hrs.  Described the palpitations as a "strong flutter." Pt. checked pulse during Triage call, and reported 76 bpm; stated "it felt like there was double beat at times."  Denies chest pain, dizziness, sweating, or shortness of breath at this time.  Stated she had not checked her pulse before she called today, but felt it had been beating faster.  Stated she works from home, and has been working on her laptop in the recliner all day.  In Triage assessment, questioned about any previous episodes; reported she has hx. of Sinus Tachycardia over past 2 years.  Stated she has had chest pain during episodes of ST.  Reported she had episode of left sided chest and left arm  Pain last week.  Reported it occurred early one morning from approx. 5:00 AM- 6:45 AM.  Reported she was dizzy and sweaty with that episode.  Reported she took 4 ASA at that time.  Per protocol for heart palpitations, sched. Pt. for appt. today at 3:30 PM @ [facility], as PCP office has no avail. Appts.  Agreed with plan.             Reason for Disposition . [1] Skipped or extra beat(s) AND [2] occurs 4 or more times per minute  Answer Assessment - Initial Assessment Questions 1. DESCRIPTION: "Please describe your heart rate or heart beat that you are having" (e.g., fast/slow, regular/irregular, skipped or extra beats, "palpitations")     Strong flutter 2. ONSET: "When did it start?" (Minutes, hours or days)      1.5 hrs. Ago. 3. DURATION: "How long does it last" (e.g., seconds, minutes, hours)     Coming and going freq. over past 1.5 hrs.  4. PATTERN "Does it come and go, or has it been constant since it started?"  "Does it get worse with exertion?"   "Are you feeling it now?"     In a recliner with laptop, so not active right now.   5. TAP: "Using your hand, can you tap out what you are feeling on a chair or table in front of you, so that I can hear?" (Note: not all  patients can do this)       Pt. able to check the pulse; "occasional double beat."  6. HEART RATE: "Can you tell me your heart rate?" "How many beats in 15 seconds?"  (Note: not all patients can do this)       Pulse rate of 76 during triage call 7. RECURRENT SYMPTOM: "Have you ever had this before?" If so, ask: "When was the last time?" and "What happened that time?"     Related hx of Sinus Tachycardia.  Reported episode of chest hurting and left arm hurting last week from approx. 5:00 AM-6:45 AM 8. CAUSE: "What do you think is causing the palpitations?"    Unsure 9. CARDIAC HISTORY: "Do you have any history of heart disease?" (e.g., heart attack, angina, bypass surgery, angioplasty, arrhythmia)      Denies heart attack or heart disease;  Related sinus tachy. X 2 years. 10. OTHER SYMPTOMS: "Do you have any other symptoms?" (e.g., dizziness, chest pain, sweating, difficulty breathing)       Recent episode of chest pain and left arm pain with sweating.  Reported has dizziness sometimes with episodes of Tachycardia.  Reported she has intermittent sweating for about 1 mo.  11. PREGNANCY: "Is there any chance you are pregnant?" "  When was your last menstrual period?"       No; hx of hysterectomy  Protocols used: [place]

## 2017-04-09 NOTE — Progress Notes (Signed)
Subjective:  Patient ID: Melinda Porter, female    DOB: 1970/07/31  Age: 46 y.o. MRN: [id number]  CC: Palpitations (heart palpitation 2 hrs,SOB,night sweat/ had chest pain 1 wk ago took aspirin didnt go to ER/ took her off BP med in Sep 2018/ )   Chest Pain   This is a new problem. The current episode started 1 to 4 weeks ago. The onset quality is gradual. The problem occurs intermittently. The problem has been waxing and waning. The pain is present in the substernal region. The quality of the pain is described as pressure. The pain does not radiate. Associated symptoms include malaise/fatigue, orthopnea, palpitations and shortness of breath. Pertinent negatives include no back pain, cough, dizziness, exertional chest pressure, fever, headaches, irregular heartbeat, lower extremity edema, nausea, near-syncope, numbness, PND, sputum production, syncope or weakness. The pain is aggravated by exertion. She has tried nothing for the symptoms. Risk factors include obesity and sedentary lifestyle.  Her past medical history is significant for anxiety/panic attacks.  one episode of palpitation today.  Outpatient Medications Prior to Visit  Medication Sig Dispense Refill  . Cholecalciferol 1000 units capsule Take 1 capsule (1,000 Units total) by mouth daily. 90 capsule 3  . clindamycin (CLEOCIN) 300 MG capsule Take 1 capsule (300 mg total) by mouth 3 (three) times daily. 21 capsule 0  . clonazePAM (KLONOPIN) 1 MG tablet Take 1 tablet (1 mg total) by mouth 3 (three) times daily as needed. For anxiety 90 tablet 1  . cyclobenzaprine (FLEXERIL) 10 MG tablet Take 1 tablet (10 mg total) by mouth 3 (three) times daily as needed for muscle spasms. 60 tablet 1  . esomeprazole (NEXIUM) 40 MG capsule Take 1 capsule (40 mg total) by mouth daily. 90 capsule 3  . hydrocortisone cream 1 % Apply 1 application topically 2 (two) times daily as needed. For itching    . hydroxychloroquine (PLAQUENIL) 200 MG tablet Take 200  mg by mouth 2 (two) times daily.    [name] nystatin (MYCOSTATIN/NYSTOP) 100000 UNIT/GM POWD For use as recommended per package insert to affected areas (under breast) 15 g 0  . sertraline (ZOLOFT) 100 MG tablet Take 1.5 tablets (150 mg total) daily by mouth. 135 tablet 1  . topiramate (TOPAMAX) 50 MG tablet Take 50 mg by mouth 2 (two) times daily.    . ustekinumab (STELARA) 90 MG/ML SOSY injection One injection every 28 days (starter pak), then advance to every 12 weeks    . triamterene-hydrochlorothiazide (MAXZIDE-25) 37.5-25 MG tablet Take 1 tablet daily by mouth.  1   No facility-administered medications prior to visit.     ROS See HPI  Objective:  BP (!) 142/92   Pulse 72   Temp 98 F (36.7 C)   Ht 5' 4"  (1.626 m)   Wt 237 lb (107.5 kg)   LMP 06/23/2009   SpO2 97%   BMI 40.68 kg/m   BP Readings from Last 3 Encounters:  04/09/17 (!) 142/92  03/28/17 (!) 144/90  03/17/17 130/90    Wt Readings from Last 3 Encounters:  04/09/17 237 lb (107.5 kg)  03/28/17 232 lb 8 oz (105.5 kg)  03/17/17 231 lb 6 oz (105 kg)    Physical Exam  Constitutional: She is oriented to person, place, and time. No distress.  Neck: Normal range of motion.  Cardiovascular: Normal rate, regular rhythm and normal heart sounds.  Pulmonary/Chest: Effort normal and breath sounds normal. No respiratory distress. She exhibits no tenderness.  Musculoskeletal: Normal range  of motion.  Neurological: She is alert and oriented to person, place, and time.  Skin: Skin is warm and dry.  Vitals reviewed.   Lab Results  Component Value Date   WBC 7.2 04/09/2017   HGB 12.2 04/09/2017   HCT 37.1 04/09/2017   PLT 157 04/09/2017   GLUCOSE 87 04/09/2017   ALT 18 01/05/2017   AST 22 01/05/2017   NA 142 04/09/2017   K 3.8 04/09/2017   CL 111 (H) 04/09/2017   CREATININE 0.70 04/09/2017   BUN 14 04/09/2017   CO2 20 04/09/2017   TSH 2.06 09/27/2016   HGBA1C 5.5 02/26/2017    Ct Abdomen Pelvis W  Contrast  Result Date: 01/05/2017 CLINICAL DATA:  Diarrhea and bloody stools with abdominal pain. Some nausea. History of lupus. EXAM: CT ABDOMEN AND PELVIS WITH CONTRAST TECHNIQUE: Multidetector CT imaging of the abdomen and pelvis was performed using the standard protocol following bolus administration of intravenous contrast. CONTRAST:  139m ISOVUE-300 IOPAMIDOL (ISOVUE-300) INJECTION 61% COMPARISON:  08/11/2015 and 04/22/2010 FINDINGS: Lower chest: Minimal bibasilar atelectasis. Hepatobiliary: Within normal. Pancreas: Within normal. Spleen: Within normal. Adrenals/Urinary Tract: Adrenal glands are normal. Kidneys are normal in size without hydronephrosis or nephrolithiasis. Ureters and bladder are normal. Stomach/Bowel: Stomach and small bowel are normal. Appendix is normal. There is wall thickening with very minimal adjacent inflammatory change of the pericolonic fat involving the descending colon likely mild acute colitis. Minimal wall thickening of the hepatic flexure. Vascular/Lymphatic: Minimal calcified plaque over the distal abdominal aorta. No adenopathy. Reproductive: Previous hysterectomy. Other: No significant free fluid.  No free peritoneal air. Musculoskeletal: Minimal degenerate change of the spine. IMPRESSION: Mild wall thickening with subtle adjacent inflammatory change involving the descending colon compatible with a mild acute colitis likely of infectious or inflammatory nature. Electronically Signed   By: DMarin OlpM.D.   On: 01/05/2017 15:44   ECG:  NSR, No change compared to 01/05/2017.  Assessment & Plan:   MWenonahwas seen today for palpitations.  Diagnoses and all orders for this visit:  Intermittent palpitations -     EKG 12-Lead -     Basic metabolic panel -     Magnesium -     CBC w/Diff -     DG Chest 2 View; Future -     DG Chest 2 View -     Ambulatory referral to Cardiology  SOB (shortness of breath) -     EKG 12-Lead -     Basic metabolic panel -      Magnesium -     CBC w/Diff -     DG Chest 2 View; Future -     DG Chest 2 View -     Ambulatory referral to Cardiology  Chest pain, unspecified type -     EKG 12-Lead -     Basic metabolic panel -     Magnesium -     CBC w/Diff -     DG Chest 2 View; Future -     DG Chest 2 View -     Ambulatory referral to Cardiology  Essential hypertension   I am having Melinda Porter maintain her hydrocortisone cream, nystatin, hydroxychloroquine, topiramate, cyclobenzaprine, Cholecalciferol, esomeprazole, ustekinumab, sertraline, triamterene-hydrochlorothiazide, clindamycin, and clonazePAM.  No orders of the defined types were placed in this encounter.   Follow-up: Return in about 4 weeks (around 05/07/2017) for HTN with DTor Netters  CWilfred Lacy NP

## 2017-04-09 NOTE — Telephone Encounter (Signed)
FYI

## 2017-04-09 NOTE — Patient Instructions (Addendum)
Normal lab results. Normal CXR. Entered referral to cardiology for further evaluation.  Go to hospital if symptoms worsen.  Chest Wall Pain Chest wall pain is pain in or around the bones and muscles of your chest. Sometimes, an injury causes this pain. Sometimes, the cause may not be known. This pain may take several weeks or longer to get better. Follow these instructions at home: Pay attention to any changes in your symptoms. Take these actions to help with your pain:  Rest as told by your health care provider.  Avoid activities that cause pain. These include any activities that use your chest muscles or your abdominal and side muscles to lift heavy items.  If directed, apply ice to the painful area: ? Put ice in a plastic bag. ? Place a towel between your skin and the bag. ? Leave the ice on for 20 minutes, 2-3 times per day.  Take over-the-counter and prescription medicines only as told by your health care provider.  Do not use tobacco products, including cigarettes, chewing tobacco, and e-cigarettes. If you need help quitting, ask your health care provider.  Keep all follow-up visits as told by your health care provider. This is important.  Contact a health care provider if:  You have a fever.  Your chest pain becomes worse.  You have new symptoms. Get help right away if:  You have nausea or vomiting.  You feel sweaty or light-headed.  You have a cough with phlegm (sputum) or you cough up blood.  You develop shortness of breath. This information is not intended to replace advice given to you by your health care provider. Make sure you discuss any questions you have with your health care provider. Document Released: 04/01/2005 Document Revised: 08/10/2015 Document Reviewed: 06/27/2014 Elsevier Interactive Patient Education  [employer].

## 2017-04-10 LAB — CBC WITH DIFFERENTIAL/PLATELET
BASOS PCT: 2.9 %
Basophils Absolute: 209 cells/uL — ABNORMAL HIGH (ref 0–200)
Eosinophils Absolute: 482 cells/uL (ref 15–500)
Eosinophils Relative: 6.7 %
HCT: 37.1 % (ref 35.0–45.0)
HEMOGLOBIN: 12.2 g/dL (ref 11.7–15.5)
Lymphs Abs: 2117 cells/uL (ref 850–3900)
MCH: 24.3 pg — ABNORMAL LOW (ref 27.0–33.0)
MCHC: 32.9 g/dL (ref 32.0–36.0)
MCV: 73.9 fL — AB (ref 80.0–100.0)
MPV: 11.4 fL (ref 7.5–12.5)
Monocytes Relative: 6.4 %
Neutro Abs: 3931 cells/uL (ref 1500–7800)
Neutrophils Relative %: 54.6 %
PLATELETS: 157 10*3/uL (ref 140–400)
RBC: 5.02 10*6/uL (ref 3.80–5.10)
RDW: 16.1 % — ABNORMAL HIGH (ref 11.0–15.0)
TOTAL LYMPHOCYTE: 29.4 %
WBC: 7.2 10*3/uL (ref 3.8–10.8)
WBCMIX: 461 {cells}/uL (ref 200–950)

## 2017-04-10 LAB — BASIC METABOLIC PANEL
BUN: 14 mg/dL (ref 7–25)
CO2: 20 mmol/L (ref 20–32)
Calcium: 9.1 mg/dL (ref 8.6–10.2)
Chloride: 111 mmol/L — ABNORMAL HIGH (ref 98–110)
Creat: 0.7 mg/dL (ref 0.50–1.10)
Glucose, Bld: 87 mg/dL (ref 65–99)
Potassium: 3.8 mmol/L (ref 3.5–5.3)
SODIUM: 142 mmol/L (ref 135–146)

## 2017-04-10 LAB — MAGNESIUM: Magnesium: 2 mg/dL (ref 1.5–2.5)

## 2017-04-13 ENCOUNTER — Encounter: Payer: Self-pay | Admitting: Family Medicine

## 2017-04-14 ENCOUNTER — Encounter: Payer: Self-pay | Admitting: Nurse Practitioner

## 2017-04-16 DIAGNOSIS — L4050 Arthropathic psoriasis, unspecified: Secondary | ICD-10-CM | POA: Diagnosis not present

## 2017-05-17 ENCOUNTER — Ambulatory Visit: Payer: 59 | Admitting: Family Medicine

## 2017-05-17 DIAGNOSIS — S60212A Contusion of left wrist, initial encounter: Secondary | ICD-10-CM | POA: Diagnosis not present

## 2017-05-19 ENCOUNTER — Telehealth: Payer: Self-pay | Admitting: Family Medicine

## 2017-05-19 NOTE — Telephone Encounter (Signed)
"  Caller states she may have broken her wrist and would like to know if there are any available appointments."   "Caller states she had her dog on a leash last night and she was walking out the door and the dog took off and slammed the pt's left wrist into the door. Pt has some swelling in her wrist. Pt is able to move her wrist.  Triage completed   Per triage

## 2017-05-19 NOTE — Telephone Encounter (Signed)
Called and spoke with patient. She went to [facility] and had xrays. She was diagnosed with soft tissue swelling and put in splint. Getting better.

## 2017-06-06 ENCOUNTER — Ambulatory Visit: Payer: 59 | Admitting: Internal Medicine

## 2017-06-13 DIAGNOSIS — R768 Other specified abnormal immunological findings in serum: Secondary | ICD-10-CM | POA: Diagnosis not present

## 2017-06-13 DIAGNOSIS — L4050 Arthropathic psoriasis, unspecified: Secondary | ICD-10-CM | POA: Diagnosis not present

## 2017-06-13 DIAGNOSIS — M797 Fibromyalgia: Secondary | ICD-10-CM | POA: Diagnosis not present

## 2017-06-18 ENCOUNTER — Ambulatory Visit: Payer: 59 | Admitting: Family Medicine

## 2017-06-18 ENCOUNTER — Telehealth: Payer: Self-pay

## 2017-06-18 DIAGNOSIS — Z0289 Encounter for other administrative examinations: Secondary | ICD-10-CM

## 2017-06-18 NOTE — Telephone Encounter (Signed)
Pt cancelled 06/18/17 2:45 appt and rescheduled on 06/20/17 at 3:15 with D Carlean Purl FNP; does a late cancellation fee need to be charged?

## 2017-06-18 NOTE — Telephone Encounter (Signed)
No charge necessary.

## 2017-06-18 NOTE — Telephone Encounter (Signed)
Copied from [place]. Topic: Quick Communication - Appointment Cancellation >> Jun 18, 2017  1:23 PM Yvette Rack wrote: Patient called to cancel appointment scheduled for 06-18-17. Patient has rescheduled their appointment.    Route to department's PEC pool.

## 2017-06-20 ENCOUNTER — Ambulatory Visit: Payer: 59 | Admitting: Family Medicine

## 2017-06-20 DIAGNOSIS — Z0289 Encounter for other administrative examinations: Secondary | ICD-10-CM

## 2017-06-30 DIAGNOSIS — L4050 Arthropathic psoriasis, unspecified: Secondary | ICD-10-CM | POA: Diagnosis not present

## 2017-07-09 ENCOUNTER — Telehealth: Payer: Self-pay

## 2017-07-09 ENCOUNTER — Ambulatory Visit (INDEPENDENT_AMBULATORY_CARE_PROVIDER_SITE_OTHER)
Admission: RE | Admit: 2017-07-09 | Discharge: 2017-07-09 | Disposition: A | Discharge status: Home / Self Care | Payer: 59 | Source: Ambulatory Visit | Attending: Family Medicine | Admitting: Family Medicine

## 2017-07-09 ENCOUNTER — Encounter: Payer: Self-pay | Admitting: Family Medicine

## 2017-07-09 ENCOUNTER — Ambulatory Visit: Payer: 59 | Admitting: Family Medicine

## 2017-07-09 VITALS — BP 152/94 | HR 81 | Temp 98.4°F | Wt 243.2 lb

## 2017-07-09 DIAGNOSIS — R197 Diarrhea, unspecified: Secondary | ICD-10-CM

## 2017-07-09 DIAGNOSIS — R350 Frequency of micturition: Secondary | ICD-10-CM | POA: Diagnosis not present

## 2017-07-09 DIAGNOSIS — S6992XS Unspecified injury of left wrist, hand and finger(s), sequela: Secondary | ICD-10-CM

## 2017-07-09 DIAGNOSIS — R300 Dysuria: Secondary | ICD-10-CM | POA: Diagnosis not present

## 2017-07-09 DIAGNOSIS — R1084 Generalized abdominal pain: Secondary | ICD-10-CM

## 2017-07-09 DIAGNOSIS — M25532 Pain in left wrist: Secondary | ICD-10-CM

## 2017-07-09 DIAGNOSIS — S6992XA Unspecified injury of left wrist, hand and finger(s), initial encounter: Secondary | ICD-10-CM | POA: Diagnosis not present

## 2017-07-09 LAB — POC URINALSYSI DIPSTICK (AUTOMATED)
Bilirubin, UA: NEGATIVE
Glucose, UA: NEGATIVE
Ketones, UA: NEGATIVE
Leukocytes, UA: NEGATIVE
Nitrite, UA: NEGATIVE
Protein, UA: POSITIVE
Spec Grav, UA: >=1.03 — AB
Urobilinogen, UA: 0.2 U/dL
pH, UA: 6

## 2017-07-09 NOTE — Telephone Encounter (Signed)
Please call patient and let her know that her wrist xray looks ok, I still want her to see orthopedics.

## 2017-07-09 NOTE — Progress Notes (Signed)
Subjective:    Patient ID: Melinda [facility], female    DOB: 1970-06-05, 47 y.o.   MRN: [id number]  HPI This is a 47 yo female who presents today with frequent, loose bowel movements and left wrist pain.   Has been seen at rheumatology frequently lately and has been switched from [place] to [place]. Has had 2 doses and feels like joint swelling has gone down. Labs from Dr. Traniyah Noon office 06/18/17- will get records.   Left wrist pain x several months, had injury went to UC. Injured Feb 2, slammed left lateral wrist/forearm into door frame.Negative xray per patient. Continued pain. Was given a removable splint but when she wears it, it causes increased swelling.     Diarrhea x 2 months. BMs orange and liquid most of the time with some small pieces of formed matter.Constant abdominal pain/cramping not relieved with food or BM. Takes nexium daily. Occasional nausea, no vomiting. Wasn't able to work yesterday. Headaches, no acid reflux.   Has had urinary frequency and burning. This is an intermittent problem, rarely UTI.   Past Medical History:  Diagnosis Date  . Anxiety   . Arthritis   . Depression   . Endometriosis   . Hiatal hernia   . Hyperlipemia   . Hypertension   . Lupus   . PTSD (post-traumatic stress disorder)    Past Surgical History:  Procedure Laterality Date  . CESAREAN SECTION  1997  . FLEXIBLE SIGMOIDOSCOPY N/A 01/06/2017   Procedure: FLEXIBLE SIGMOIDOSCOPY;  Surgeon: Yetta Flock, MD;  Location: [facility];  Service: Gastroenterology;  Laterality: N/A;  . OOPHORECTOMY Bilateral   . PARTIAL HYSTERECTOMY  07/2009  . TEMPOROMANDIBULAR JOINT SURGERY  2009   right side  . TONSILLECTOMY  2009   Family History  Problem Relation Age of Onset  . Colon polyps Maternal Grandmother   . Lung cancer Maternal Grandmother   . Liver disease Father        agent orange  . Diabetes Maternal Grandfather   . Heart disease Maternal Grandfather   . Hypertension Maternal  Grandfather   . Colon cancer Maternal Uncle        great uncle  . Breast cancer Neg Hx    Social History   Tobacco Use  . Smoking status: Former Smoker    Types: Cigarettes    Last attempt to quit: 03/09/1999    Years since quitting: 18.3  . Smokeless tobacco: Never Used  Substance Use Topics  . Alcohol use: No    Alcohol/week: 0.0 oz  . Drug use: No      Review of Systems Per HPI    Objective:   Physical Exam  Constitutional: She is oriented to person, place, and time. She appears well-developed and well-nourished. No distress.  HENT:  Head: Normocephalic and atraumatic.  Eyes: Conjunctivae are normal.  Neck: Normal range of motion. Neck supple.  Cardiovascular: Normal rate, regular rhythm and normal heart sounds.  Pulmonary/Chest: Effort normal and breath sounds normal.  Abdominal: Soft. She exhibits no distension. There is tenderness. There is no rebound and no guarding.  Musculoskeletal:       Left wrist: She exhibits decreased range of motion, tenderness, bony tenderness and swelling. She exhibits no effusion and no crepitus.  Left dorsal hand and fingers with mild edema. Decreased ROM with finger flexion, wrist flexion and extension. Dorsum of hand and distal lateral forearm tender to palpation. No erythema or warmth.   Neurological: She is alert and oriented to  person, place, and time.  Skin: Skin is warm and dry. She is not diaphoretic.  Psychiatric: She has a normal mood and affect. Her behavior is normal. Judgment and thought content normal.  Vitals reviewed.     BP (!) 152/94   Pulse 81   Temp 98.4 F (36.9 C) (Oral)   Wt 243 lb 4 oz (110.3 kg)   LMP 06/23/2009   SpO2 98%   BMI 41.75 kg/m  Wt Readings from Last 3 Encounters:  07/09/17 243 lb 4 oz (110.3 kg)  04/09/17 237 lb (107.5 kg)  03/28/17 232 lb 8 oz (105.5 kg)   BP Readings from Last 3 Encounters:  07/09/17 (!) 152/94  04/09/17 (!) 142/92  03/28/17 (!) 144/90   Results for orders  placed or performed in visit on 07/09/17  POCT Urinalysis Dipstick (Automated)  Result Value Ref Range   Color, UA yellow    Clarity, UA clear    Glucose, UA neg    Bilirubin, UA neg    Ketones, UA neg    Spec Grav, UA >=1.030 (A) 1.010 - 1.025   Blood, UA 2+    pH, UA 6.0 5.0 - 8.0   Protein, UA pos    Urobilinogen, UA 0.2 0.2 or 1.0 E.U./dL   Nitrite, UA neg    Leukocytes, UA Negative Negative       Assessment & Plan:  1. Generalized abdominal pain - POCT Urinalysis Dipstick (Automated) - US Abdomen Complete; Future  2. Left wrist pain - DG Wrist Complete Left; Future - Ambulatory referral to Hand Surgery  3. Diarrhea, unspecified type - US Abdomen Complete; Future - C. difficile GDH and Toxin A/B; Future  4. Urinary frequency - POCT Urinalysis Dipstick (Automated)  5. Dysuria - POCT Urinalysis Dipstick (Automated)  6. Injury of left wrist, sequela - DG Wrist Complete Left; Future - Ambulatory referral to Hand Surgery   Clarene Reamer, FNP-BC  [facility]  07/09/2017 8:40 PM

## 2017-07-09 NOTE — Telephone Encounter (Signed)
Patient is asking about her Xray results. Please advise. Thank Edrick Kins, RMA

## 2017-07-09 NOTE — Patient Instructions (Addendum)
Good to see you today  Please see the referrals coordinator on your way out to schedule orthopedic visit and abdominal ultrasound  I will notify you of lab results- urine, stool study and xray report

## 2017-07-10 ENCOUNTER — Encounter (HOSPITAL_COMMUNITY): Payer: Self-pay | Admitting: Emergency Medicine

## 2017-07-10 ENCOUNTER — Other Ambulatory Visit: Payer: Self-pay

## 2017-07-10 ENCOUNTER — Emergency Department (HOSPITAL_COMMUNITY)
Admission: EM | Admit: 2017-07-10 | Discharge: 2017-07-10 | Disposition: A | Discharge status: Home / Self Care | Payer: 59 | Attending: Emergency Medicine | Admitting: Emergency Medicine

## 2017-07-10 ENCOUNTER — Emergency Department (HOSPITAL_COMMUNITY): Payer: 59

## 2017-07-10 DIAGNOSIS — Z87891 Personal history of nicotine dependence: Secondary | ICD-10-CM | POA: Insufficient documentation

## 2017-07-10 DIAGNOSIS — E119 Type 2 diabetes mellitus without complications: Secondary | ICD-10-CM | POA: Diagnosis not present

## 2017-07-10 DIAGNOSIS — I10 Essential (primary) hypertension: Secondary | ICD-10-CM | POA: Insufficient documentation

## 2017-07-10 DIAGNOSIS — N200 Calculus of kidney: Secondary | ICD-10-CM

## 2017-07-10 DIAGNOSIS — N368 Other specified disorders of urethra: Secondary | ICD-10-CM | POA: Diagnosis not present

## 2017-07-10 DIAGNOSIS — Z79899 Other long term (current) drug therapy: Secondary | ICD-10-CM | POA: Insufficient documentation

## 2017-07-10 DIAGNOSIS — N23 Unspecified renal colic: Secondary | ICD-10-CM | POA: Insufficient documentation

## 2017-07-10 DIAGNOSIS — R109 Unspecified abdominal pain: Secondary | ICD-10-CM | POA: Diagnosis present

## 2017-07-10 LAB — COMPREHENSIVE METABOLIC PANEL
ALBUMIN: 4.4 g/dL (ref 3.5–5.0)
ALT: 15 U/L (ref 14–54)
AST: 22 U/L (ref 15–41)
Alkaline Phosphatase: 71 U/L (ref 38–126)
Anion gap: 11 (ref 5–15)
BUN: 13 mg/dL (ref 6–20)
CHLORIDE: 112 mmol/L — AB (ref 101–111)
CO2: 24 mmol/L (ref 22–32)
CREATININE: 1.1 mg/dL — AB (ref 0.44–1.00)
Calcium: 8.9 mg/dL (ref 8.9–10.3)
GFR calc non Af Amer: >60 mL/min (ref >60)
GLUCOSE: 161 mg/dL — AB (ref 65–99)
Potassium: 4.2 mmol/L (ref 3.5–5.1)
SODIUM: 147 mmol/L — AB (ref 135–145)
Total Bilirubin: 0.2 mg/dL — ABNORMAL LOW (ref 0.3–1.2)
Total Protein: 7.4 g/dL (ref 6.5–8.1)

## 2017-07-10 LAB — URINALYSIS, ROUTINE W REFLEX MICROSCOPIC
Bilirubin Urine: NEGATIVE
Glucose, UA: NEGATIVE mg/dL
Ketones, ur: NEGATIVE mg/dL
Nitrite: NEGATIVE
PROTEIN: NEGATIVE mg/dL
Specific Gravity, Urine: 1.016 (ref 1.005–1.030)
WBC, UA: NONE SEEN WBC/hpf (ref 0–5)
pH: 8 (ref 5.0–8.0)

## 2017-07-10 LAB — CBC
HCT: 39.9 % (ref 36.0–46.0)
Hemoglobin: 12.4 g/dL (ref 12.0–15.0)
MCH: 24.1 pg — AB (ref 26.0–34.0)
MCHC: 31.1 g/dL (ref 30.0–36.0)
MCV: 77.6 fL — ABNORMAL LOW (ref 78.0–100.0)
PLATELETS: 190 10*3/uL (ref 150–400)
RBC: 5.14 MIL/uL — AB (ref 3.87–5.11)
RDW: 16.4 % — ABNORMAL HIGH (ref 11.5–15.5)
WBC: 9.6 10*3/uL (ref 4.0–10.5)

## 2017-07-10 LAB — LIPASE, BLOOD: LIPASE: 36 U/L (ref 11–51)

## 2017-07-10 MED ORDER — IOPAMIDOL (ISOVUE-300) INJECTION 61%
100.0000 mL | Freq: Once | INTRAVENOUS | Status: AC | PRN
  Administered 2017-07-10: 100 mL via INTRAVENOUS

## 2017-07-10 MED ORDER — HYDROCODONE-ACETAMINOPHEN 5-325 MG PO TABS: 1.0000 | ORAL_TABLET | Freq: Four times a day (QID) | ORAL | 0 refills | Status: DC | PRN

## 2017-07-10 MED ORDER — KETOROLAC TROMETHAMINE 15 MG/ML IJ SOLN
15.0000 mg | Freq: Once | INTRAMUSCULAR | Status: AC
  Administered 2017-07-10: 15 mg via INTRAVENOUS
  Filled 2017-07-10: qty 1

## 2017-07-10 MED ORDER — IOPAMIDOL (ISOVUE-300) INJECTION 61%
INTRAVENOUS | Status: AC
  Filled 2017-07-10: qty 100

## 2017-07-10 MED ORDER — ONDANSETRON 4 MG PO TBDP: 4.0000 mg | ORAL_TABLET | Freq: Three times a day (TID) | ORAL | 0 refills | Status: DC | PRN

## 2017-07-10 MED ORDER — HYDROMORPHONE HCL 1 MG/ML IJ SOLN
1.0000 mg | Freq: Once | INTRAMUSCULAR | Status: AC
  Administered 2017-07-10: 1 mg via INTRAVENOUS
  Filled 2017-07-10: qty 1

## 2017-07-10 MED ORDER — IBUPROFEN 600 MG PO TABS: 600.0000 mg | ORAL_TABLET | Freq: Four times a day (QID) | ORAL | 0 refills | Status: DC | PRN

## 2017-07-10 MED ORDER — ONDANSETRON HCL 4 MG/2ML IJ SOLN
4.0000 mg | Freq: Once | INTRAMUSCULAR | Status: AC
  Administered 2017-07-10: 4 mg via INTRAVENOUS
  Filled 2017-07-10: qty 2

## 2017-07-10 NOTE — Telephone Encounter (Signed)
Spoke to pt and advised per [employer]. Pt states that she was seen at the ED last night for severe abdominal pain.  A CT was completed and she was informed that she has kidney stones. Pt is wanting to know if she should proceed with u/s scheduled for 4/8. pls advise

## 2017-07-10 NOTE — Telephone Encounter (Signed)
Spoke to pt and advised; states she will contact office and cancel u/s

## 2017-07-10 NOTE — Discharge Instructions (Signed)
We saw you in the ER for the abdominal pain. Our results indicate that you have a kidney stone. We were able to get your pain is relative control, and we can safely send you home.  Take the meds prescribed. Set up an appointment with the Urologist. If the pain is unbearable, you start having fevers, chills, and are unable to keep any meds down - then return to the ER.

## 2017-07-10 NOTE — Telephone Encounter (Signed)
Please call her and tell her that I was concerned for gallbladder problem, but the CT showed that her gallbladder looked good. She does not need the abdominal ultrasound. I hope she is feeling better today.

## 2017-07-10 NOTE — ED Notes (Signed)
Unable to obtain labs on pt.

## 2017-07-10 NOTE — ED Provider Notes (Signed)
[facility] DEPT Provider Note   CSN: [id number] Arrival date & time: 07/10/17  0148     History   Chief Complaint Chief Complaint  Patient presents with  . Abdominal Pain    HPI Melinda Porter is a 47 y.o. female.  HPI  47 year old female with history of lupus, endometriosis comes in with chief complaint of abdominal pain.  Patient started having right-sided abdominal pain about 2 days ago, and over time its become more constant and severe.  Patient is status post hysterectomy and oophorectomy.  She denies any associated nausea, vomiting, fevers.  Review of system is negative for any new dysuria, hematuria.  Patient's pain is sharp and does radiate to the flank region.  Past Medical History:  Diagnosis Date  . Anxiety   . Arthritis   . Depression   . Endometriosis   . Hiatal hernia   . Hyperlipemia   . Hypertension   . Lupus   . PTSD (post-traumatic stress disorder)     Patient Active Problem List   Diagnosis Date Noted  . Colitis 01/05/2017  . Lower GI bleed 01/05/2017  . Headache 11/05/2014  . Persistent headaches 11/05/2014  . Facial droop 11/05/2014  . DM (diabetes mellitus) ([place]) 11/05/2014  . Hypokalemia 11/05/2014  . Syncope 11/05/2014  . Hypertension 11/05/2014  . Hyperlipemia 11/05/2014  . Psoriatic arthritis ([place]) 11/05/2014  . Hypoglycemia 04/13/2014  . Abdominal pain, epigastric 03/08/2014  . Belching 03/08/2014  . Nausea without vomiting 03/08/2014    Past Surgical History:  Procedure Laterality Date  . CESAREAN SECTION  1997  . FLEXIBLE SIGMOIDOSCOPY N/A 01/06/2017   Procedure: FLEXIBLE SIGMOIDOSCOPY;  Surgeon: Yetta Flock, MD;  Location: [facility];  Service: Gastroenterology;  Laterality: N/A;  . OOPHORECTOMY Bilateral   . PARTIAL HYSTERECTOMY  07/2009  . TEMPOROMANDIBULAR JOINT SURGERY  2009   right side  . TONSILLECTOMY  2009     OB History    Gravida  2   Para  1   Term      Preterm      AB  1   Living  1     SAB      TAB  1   Ectopic      Multiple      Live Births               Home Medications    Prior to Admission medications   Medication Sig Start Date End Date Taking? Authorizing Provider  abatacept (ORENCIA) 250 MG injection Inject into the vein.   Yes [provider]  Cholecalciferol 1000 units capsule Take 1 capsule (1,000 Units total) by mouth daily. 12/27/16  Yes Elby Beck, FNP  clonazePAM (KLONOPIN) 1 MG tablet Take 1 tablet (1 mg total) by mouth 3 (three) times daily as needed. For anxiety 04/09/17  Yes Elby Beck, FNP  cyclobenzaprine (FLEXERIL) 10 MG tablet Take 1 tablet (10 mg total) by mouth 3 (three) times daily as needed for muscle spasms. 08/02/16  Yes Elby Beck, FNP  esomeprazole (NEXIUM) 40 MG capsule Take 1 capsule (40 mg total) by mouth daily. 02/05/17  Yes Elby Beck, FNP  hydroxychloroquine (PLAQUENIL) 200 MG tablet Take 200 mg by mouth 2 (two) times daily.   Yes [provider]  sertraline (ZOLOFT) 100 MG tablet Take 1.5 tablets (150 mg total) daily by mouth. 02/26/17  Yes Elby Beck, FNP  topiramate (TOPAMAX) 50 MG tablet Take 50 mg  by mouth 2 (two) times daily.   Yes [provider]  HYDROcodone-acetaminophen (NORCO/VICODIN) 5-325 MG tablet Take 1 tablet by mouth every 6 (six) hours as needed. 07/10/17   Varney Biles, MD  ibuprofen (ADVIL,MOTRIN) 600 MG tablet Take 1 tablet (600 mg total) by mouth every 6 (six) hours as needed. 07/10/17   Varney Biles, MD  nystatin (MYCOSTATIN/NYSTOP) 100000 UNIT/GM POWD For use as recommended per package insert to affected areas (under breast) Patient not taking: Reported on 07/10/2017 11/06/14   Velvet Bathe, MD  ondansetron (ZOFRAN ODT) 4 MG disintegrating tablet Take 1 tablet (4 mg total) by mouth every 8 (eight) hours as needed for nausea or vomiting. 07/10/17   Varney Biles, MD    Family History Family History    Problem Relation Age of Onset  . Colon polyps Maternal Grandmother   . Lung cancer Maternal Grandmother   . Liver disease Father        agent orange  . Diabetes Maternal Grandfather   . Heart disease Maternal Grandfather   . Hypertension Maternal Grandfather   . Colon cancer Maternal Uncle        great uncle  . Breast cancer Neg Hx     Social History Social History   Tobacco Use  . Smoking status: Former Smoker    Types: Cigarettes    Last attempt to quit: 03/09/1999    Years since quitting: 18.3  . Smokeless tobacco: Never Used  Substance Use Topics  . Alcohol use: No    Alcohol/week: 0.0 oz  . Drug use: No     Allergies   Lyrica [pregabalin]; Sulfa antibiotics; Ciprofloxacin; Gabapentin; Nsaids; Tolmetin; Codeine; Humira [adalimumab]; Morphine and related; and Penicillins   Review of Systems Review of Systems  Constitutional: Positive for activity change.  Gastrointestinal: Positive for abdominal distention, nausea and vomiting.  Genitourinary: Positive for flank pain.  Allergic/Immunologic: Positive for immunocompromised state.     Physical Exam Updated Vital Signs BP 132/88   Pulse 92   Temp 98.3 F (36.8 C) (Oral)   Resp 14   LMP 06/23/2009   SpO2 98%   Physical Exam  Constitutional: She is oriented to person, place, and time. She appears well-developed.  HENT:  Head: Normocephalic and atraumatic.  Eyes: EOM are normal.  Neck: Normal range of motion. Neck supple.  Cardiovascular: Normal rate.  Pulmonary/Chest: Effort normal.  Abdominal: Bowel sounds are normal. There is tenderness in the right lower quadrant. There is guarding and tenderness at McBurney's point.  Neurological: She is alert and oriented to person, place, and time.  Skin: Skin is warm and dry.  Nursing note and vitals reviewed.    ED Treatments / Results  Labs (all labs ordered are listed, but only abnormal results are displayed) Labs Reviewed  COMPREHENSIVE METABOLIC PANEL  - Abnormal; Notable for the following components:      Result Value   Sodium 147 (*)    Chloride 112 (*)    Glucose, Bld 161 (*)    Creatinine, Ser 1.10 (*)    Total Bilirubin 0.2 (*)    All other components within normal limits  CBC - Abnormal; Notable for the following components:   RBC 5.14 (*)    MCV 77.6 (*)    MCH 24.1 (*)    RDW 16.4 (*)    All other components within normal limits  URINALYSIS, ROUTINE W REFLEX MICROSCOPIC - Abnormal; Notable for the following components:   APPearance CLOUDY (*)    Hgb  urine dipstick SMALL (*)    Leukocytes, UA TRACE (*)    Bacteria, UA RARE (*)    Squamous Epithelial / LPF 0-5 (*)    All other components within normal limits  LIPASE, BLOOD    EKG None  Radiology Dg Wrist Complete Left  Result Date: 07/09/2017 CLINICAL DATA:  Left wrist injury following a fall 2 months ago. EXAM: LEFT WRIST - COMPLETE 3+ VIEW COMPARISON:  No recent studies in [facility] FINDINGS: The bones are subjectively adequately mineralized. There is no acute or healing fracture. There is subtle irregularity of the lateral aspect of the ulnar styloid but no fracture line is observed. The joint spaces are well maintained. The soft tissues exhibit no acute abnormalities. IMPRESSION: There is no acute or significant chronic bony abnormality of the left wrist. Electronically Signed   By: David  Martinique [name]   On: 07/09/2017 14:13   Ct Abdomen Pelvis W Contrast  Result Date: 07/10/2017 CLINICAL DATA:  47 year old female with right lower quadrant abdominal pain. EXAM: CT ABDOMEN AND PELVIS WITH CONTRAST TECHNIQUE: Multidetector CT imaging of the abdomen and pelvis was performed using the standard protocol following bolus administration of intravenous contrast. CONTRAST:  168m ISOVUE-300 IOPAMIDOL (ISOVUE-300) INJECTION 61% COMPARISON:  Abdominal CT dated 01/05/2017 FINDINGS: Lower chest: Minimal bibasilar atelectatic changes. The visualized lung bases are otherwise clear. No  intra-abdominal free air or free fluid. Hepatobiliary: Fatty infiltration of the liver. No intrahepatic biliary ductal dilatation. The gallbladder is unremarkable. Pancreas: Unremarkable. No pancreatic ductal dilatation or surrounding inflammatory changes. Spleen: Normal in size without focal abnormality. Adrenals/Urinary Tract: The adrenal glands are unremarkable. There is a 2 mm stone along the posterior wall of the urinary bladder in the region of the ureterovesical junction which may represent a recently passed right renal calculus versus a right UVJ stone. There is mild right hydronephroureter. There is delayed enhancement and excretion of contrast by the right kidney. The left kidney is unremarkable. The urinary bladder is predominantly collapsed. There is apparent diffuse thickening of the bladder wall which may be partly related to underdistention. Cystitis is not excluded. Correlation with urinalysis recommended. Stomach/Bowel: There is no bowel obstruction or active inflammation. The appendix is normal. Vascular/Lymphatic: Mild aortoiliac atherosclerotic disease. The abdominal aorta and IVC are otherwise unremarkable. No portal venous gas. There is no adenopathy. Reproductive: Hysterectomy.  No pelvic mass. Other: Small fat containing umbilical hernia. Musculoskeletal: No acute or significant osseous findings. IMPRESSION: 1. A 2 mm recently passed right renal calculus within the urinary bladder versus a right UVJ stone. There is mild right hydronephroureter. 2. Mild fatty liver. 3. No bowel obstruction or active inflammation.  Normal appendix. Electronically Signed   By: AAnner CreteM.D.   On: 07/10/2017 05:36    Procedures Procedures (including critical care time)  Medications Ordered in ED Medications  iopamidol (ISOVUE-300) 61 % injection (has no administration in time range)  ondansetron (ZOFRAN) injection 4 mg (4 mg Intravenous Given 07/10/17 0500)  HYDROmorphone (DILAUDID) injection 1  mg (1 mg Intravenous Given 07/10/17 0500)  iopamidol (ISOVUE-300) 61 % injection 100 mL (100 mLs Intravenous Contrast Given 07/10/17 0519)  ketorolac (TORADOL) 15 MG/ML injection 15 mg (15 mg Intravenous Given 07/10/17 0557)     Initial Impression / Assessment and Plan / ED Course  I have reviewed the triage vital signs and the nursing notes.  Pertinent labs & imaging results that were available during my care of the patient were reviewed by me and considered in my medical  decision making (see chart for details).  Clinical Course as of Jul 10 717  Thu Jul 10, 2017  0715 Results from the ER workup discussed with the patient face to face and all questions answered to the best of my ability.  Patient is pain-free after the dose of Toradol. Strict ER return precautions have been discussed, and patient is agreeing with the plan and is comfortable with the workup done and the recommendations from the ER.   CT ABDOMEN PELVIS W CONTRAST [AN]    Clinical Course User Index [AN] Varney Biles, MD   47 year old female comes in with chief complaint of abdominal pain.  Patient has history of lupus and she is status post total hysterectomy and oophorectomy.  On my exam patient has right lower quadrant tenderness.  She has guarding.  Differential diagnosis includes appendicitis, pyelonephritis, cystitis, ureteral colic.  CT scan results show that patient is having a ureteral colic. There is some evidence of inflammation of the bladder, however patient states that she has no new dysuria or hematuria, and that her symptoms symptoms of urinary discomfort are chronic in nature, and normally do not respond to antibiotics.  Patient will be treated as a ureteral colic.  Strict ER return precautions have been discussed.  Final Clinical Impressions(s) / ED Diagnoses   Final diagnoses:  Ureteral colic  Nephrolithiasis    ED Discharge Orders        Ordered    ibuprofen (ADVIL,MOTRIN) 600 MG tablet   Every 6 hours PRN     07/10/17 0715    HYDROcodone-acetaminophen (NORCO/VICODIN) 5-325 MG tablet  Every 6 hours PRN     07/10/17 0715    ondansetron (ZOFRAN ODT) 4 MG disintegrating tablet  Every 8 hours PRN     07/10/17 0715       Varney Biles, MD 07/10/17 [phone]

## 2017-07-10 NOTE — ED Triage Notes (Signed)
Pt is c/o right lower abd pain   Pt states she has had abd pain x 2 days  Pt was seen by her PCP on Wednesday and they did an ultrasound   Pt states she went out to dinner last night and then the pain increased, she had a fever, and started vomiting  Pt states the pain is now in her RLQ  Pt took aleve around 8pm

## 2017-07-14 ENCOUNTER — Ambulatory Visit (INDEPENDENT_AMBULATORY_CARE_PROVIDER_SITE_OTHER): Payer: Self-pay | Admitting: Orthopaedic Surgery

## 2017-07-14 DIAGNOSIS — L4050 Arthropathic psoriasis, unspecified: Secondary | ICD-10-CM | POA: Diagnosis not present

## 2017-07-15 ENCOUNTER — Encounter: Payer: Self-pay | Admitting: Family Medicine

## 2017-07-16 ENCOUNTER — Other Ambulatory Visit: Payer: Self-pay | Admitting: Family Medicine

## 2017-07-16 DIAGNOSIS — F419 Anxiety disorder, unspecified: Secondary | ICD-10-CM

## 2017-07-16 MED ORDER — CLONAZEPAM 1 MG PO TABS: 1.0000 mg | ORAL_TABLET | Freq: Three times a day (TID) | ORAL | 1 refills | Status: DC | PRN

## 2017-07-16 MED ORDER — TOPIRAMATE 50 MG PO TABS: 50.0000 mg | ORAL_TABLET | Freq: Two times a day (BID) | ORAL | 2 refills | Status: DC

## 2017-07-16 MED ORDER — ESOMEPRAZOLE MAGNESIUM 40 MG PO CPDR: 40.0000 mg | DELAYED_RELEASE_CAPSULE | Freq: Every day | ORAL | 3 refills | Status: DC

## 2017-07-18 ENCOUNTER — Telehealth: Payer: Self-pay | Admitting: Family Medicine

## 2017-07-18 NOTE — Telephone Encounter (Signed)
[employer] faxed paperwork In debbie's in box For review and signature

## 2017-07-18 NOTE — Telephone Encounter (Signed)
Completed and returned to your desk.

## 2017-07-21 ENCOUNTER — Other Ambulatory Visit: Payer: 59

## 2017-07-21 ENCOUNTER — Encounter (INDEPENDENT_AMBULATORY_CARE_PROVIDER_SITE_OTHER): Payer: Self-pay

## 2017-07-21 NOTE — Telephone Encounter (Signed)
Paperwork faxed Sent pt my chart message letter her know  Copy for scan Copy for pt

## 2017-07-22 ENCOUNTER — Encounter: Payer: Self-pay | Admitting: Internal Medicine

## 2017-07-22 ENCOUNTER — Telehealth: Payer: Self-pay | Admitting: Internal Medicine

## 2017-07-22 ENCOUNTER — Ambulatory Visit (INDEPENDENT_AMBULATORY_CARE_PROVIDER_SITE_OTHER): Payer: 59 | Admitting: Internal Medicine

## 2017-07-22 ENCOUNTER — Other Ambulatory Visit: Payer: 59

## 2017-07-22 VITALS — BP 130/92 | HR 88 | Ht 64.0 in | Wt 243.0 lb

## 2017-07-22 DIAGNOSIS — R1030 Lower abdominal pain, unspecified: Secondary | ICD-10-CM | POA: Diagnosis not present

## 2017-07-22 DIAGNOSIS — R197 Diarrhea, unspecified: Secondary | ICD-10-CM

## 2017-07-22 MED ORDER — PEG-KCL-NACL-NASULF-NA ASC-C 140 G PO SOLR: 1.0000 | Freq: Once | ORAL | 0 refills | Status: AC

## 2017-07-22 MED ORDER — METOCLOPRAMIDE HCL 10 MG PO TABS: ORAL_TABLET | ORAL | 0 refills | Status: DC

## 2017-07-22 NOTE — Patient Instructions (Signed)
We have sent the following medications to your pharmacy for you to pick up at your convenience:  [facility] provider has requested that you go to the basement level for lab work before leaving today. Press "B" on the elevator. The lab is located at the first door on the left as you exit the elevator.  You have been scheduled for a colonoscopy. Please follow written instructions given to you at your visit today.  Please pick up your prep supplies at the pharmacy within the next 1-3 days. If you use inhalers (even only as needed), please bring them with you on the day of your procedure. Your physician has requested that you go to www.startemmi.com and enter the access code given to you at your visit today. This web site gives a general overview about your procedure. However, you should still follow specific instructions given to you by our office regarding your preparation for the procedure.

## 2017-07-23 NOTE — Telephone Encounter (Signed)
Spoke to patient and let her know I would leave a Plenvu sample up front to be picked up.  Patient agreed.

## 2017-07-24 LAB — GASTROINTESTINAL PATHOGEN PANEL PCR
C. difficile Tox A/B, PCR: NOT DETECTED
Campylobacter, PCR: NOT DETECTED
Cryptosporidium, PCR: NOT DETECTED
E COLI (STEC) STX1/STX2, PCR: NOT DETECTED
E coli (ETEC) LT/ST PCR: NOT DETECTED
E coli 0157, PCR: NOT DETECTED
Giardia lamblia, PCR: NOT DETECTED
NOROVIRUS, PCR: NOT DETECTED
Rotavirus A, PCR: NOT DETECTED
SALMONELLA, PCR: NOT DETECTED
Shigella, PCR: NOT DETECTED

## 2017-07-25 ENCOUNTER — Encounter: Payer: Self-pay | Admitting: Internal Medicine

## 2017-07-25 NOTE — Progress Notes (Signed)
HISTORY OF PRESENT ILLNESS:  Melinda Porter is a 47 y.o. female with hypertension, hyperlipidemia, lupus, arthritis associated with lupus, anxiety, depression, obesity, and PTSD. I saw the patient in the office12/06/2016 after she was hospitalized with ischemic colitis. The examination by Dr. Havery Moros revealed segmental colitis in the descending portion. Biopsies revealed active colitis consistent with ischemic injury. At time of her office visit the patient was doing well. Bowel habits tended toward constipation.She is known to have fatty liver. The patient tells me that in late January she began having problems with diarrhea. This has persisted. She describes 5-8 loose bowel movements per day. She denies antibiotics prior. She was placed on Orencia but this occurred after symptoms began. No other change in medications. For her arthritis she was on prednisone for weeks. She did not notice that this had any impact on her bowel habits. Other complaints include nausea and bloating. She subsequently has developed problems with lower abdominal pain. For this she was evaluated by [facility] 07/09/2017. I have reviewed that encounter. The following day she presented to the emergency room. CT scan revealed evidence for right-sided nephrolithiasis suggesting recently passed stone with mild hydroureter. No other abnormalities. No inflammation of the bowel noted. Blood work at that time revealed hemoglobin 12.4. White blood cell count 9.6. Normal liver tests. No weight loss role of this. Defecation seems to help the discomfort somewhat. She does report nocturnal symptoms. She has noticed some blood in the stool intermittently over the past few weeks..... Should be noted that the patient had normal upper endoscopy in 2016 and ultrasound in 2017 showing fatty liver only.  REVIEW OF SYSTEMS:  All non-GI ROS negative unless otherwise stated in the history of present illness except for arthritis,  headaches  Past Medical History:  Diagnosis Date  . Anxiety   . Arthritis   . Depression   . Endometriosis   . Hiatal hernia   . Hyperlipemia   . Hypertension   . Lupus ([place])   . PTSD (post-traumatic stress disorder)     Past Surgical History:  Procedure Laterality Date  . CESAREAN SECTION  1997  . FLEXIBLE SIGMOIDOSCOPY N/A 01/06/2017   Procedure: FLEXIBLE SIGMOIDOSCOPY;  Surgeon: Yetta Flock, MD;  Location: [facility];  Service: Gastroenterology;  Laterality: N/A;  . OOPHORECTOMY Bilateral   . PARTIAL HYSTERECTOMY  07/2009  . TEMPOROMANDIBULAR JOINT SURGERY  2009   right side  . TONSILLECTOMY  2009    Social History Melinda Porter  reports that she quit smoking about 18 years ago. Her smoking use included cigarettes. She has never used smokeless tobacco. She reports that she does not drink alcohol or use drugs.  family history includes Colon cancer in her maternal uncle; Colon polyps in her maternal grandmother; Diabetes in her maternal grandfather; Heart disease in her maternal grandfather; Hypertension in her maternal grandfather; Liver disease in her father; Lung cancer in her maternal grandmother.  Allergies  Allergen Reactions  . Lyrica [Pregabalin] Shortness Of Breath  . Sulfa Antibiotics Rash  . Ciprofloxacin Nausea Only  . Gabapentin     Stroke like symptoms  . Nsaids Other (See Comments)    Liver problems- can't take tylenol, ibuprofen etc.  . Tolmetin     Other reaction(s): Other (See Comments) Liver problems- can't take tylenol, ibuprofen etc.  . Codeine Rash  . Humira [Adalimumab] Rash    Worsening of Lupus rash.  . Morphine And Related Rash    "Wired up"  . Penicillins  Rash    Has patient had a PCN reaction causing immediate rash, facial/tongue/throat swelling, SOB or lightheadedness with hypotension: NO Has patient had a PCN reaction causing severe rash involving mucus membranes or skin necrosis: NO Has patient had a PCN reaction that  required hospitalization NO Has patient had a PCN reaction occurring within the last 10 years: NO If all of the above answers are "NO", then may proceed with Cephalosporin use.       PHYSICAL EXAMINATION: Vital signs: BP (!) 130/92   Pulse 88   Ht 5' 4"  (1.626 m)   Wt 243 lb (110.2 kg)   LMP 06/23/2009   BMI 41.71 kg/m   Constitutional: generally well-appearing, no acute distress Psychiatric: alert and oriented x3, cooperative Eyes: extraocular movements intact, anicteric, conjunctiva pink Mouth: oral pharynx moist, no lesions Neck: supple no lymphadenopathy Cardiovascular: heart regular rate and rhythm, no murmur Lungs: clear to auscultation bilaterally Abdomen: soft,obese, complains of tenderness with palpation throughout the lower half, nondistended, no obvious ascites, no peritoneal signs, normal bowel sounds, no organomegaly Rectal:deferred until colonoscopy Extremities: no clubbing, cyanosis, or lower extremity edema bilaterally. Hands are slightly swollen and tender consistent with arthritis Skin: no lesions on visible extremities Neuro: No focal deficits. No asterixis. Cranial nerves intact  ASSESSMENT:  #1. Persistent diarrhea. Rule out infection. Rule out macroscopic colitis. Rule out microscopic colitis #2. Abdominal pain.Negative recent CT save evidence for kidney stone on the right #3. Recent problem with kidney stone on the right #4. History of ischemic colitis late last year #5. Multiple medical problems and morbid obesity with BMI 42 6. Fatty liver  On imaging with normal liver tests  PLAN:  #1.GI pathogen panel to include testing for Clostridium difficile #2. If GI pathogen panel negative, then colonoscopy with biopsies. The patient is high-risk given her comorbidities and body habitus.The nature of the procedure, as well as the risks, benefits, and alternatives were carefully and thoroughly reviewed with the patient. Ample time for discussion and questions  allowed. The patient understood, was satisfied, and agreed to proceed. #3. Further recommendations after the above results available

## 2017-07-28 ENCOUNTER — Encounter: Payer: Self-pay | Admitting: Internal Medicine

## 2017-07-28 ENCOUNTER — Other Ambulatory Visit: Payer: Self-pay

## 2017-07-28 ENCOUNTER — Encounter: Payer: Self-pay | Admitting: Family Medicine

## 2017-07-28 ENCOUNTER — Ambulatory Visit (AMBULATORY_SURGERY_CENTER): Payer: 59 | Admitting: Internal Medicine

## 2017-07-28 VITALS — BP 149/90 | HR 80 | Temp 99.5°F | Resp 10 | Ht 64.0 in | Wt 243.0 lb

## 2017-07-28 DIAGNOSIS — D124 Benign neoplasm of descending colon: Secondary | ICD-10-CM

## 2017-07-28 DIAGNOSIS — R197 Diarrhea, unspecified: Secondary | ICD-10-CM | POA: Diagnosis present

## 2017-07-28 DIAGNOSIS — K599 Functional intestinal disorder, unspecified: Secondary | ICD-10-CM | POA: Diagnosis not present

## 2017-07-28 DIAGNOSIS — K635 Polyp of colon: Secondary | ICD-10-CM | POA: Diagnosis not present

## 2017-07-28 MED ORDER — SODIUM CHLORIDE 0.9 % IV SOLN: 500.0000 mL | Freq: Once | INTRAVENOUS | Status: DC

## 2017-07-28 MED ORDER — DIPHENOXYLATE-ATROPINE 2.5-0.025 MG PO TABS: 1.0000 | ORAL_TABLET | Freq: Three times a day (TID) | ORAL | 2 refills | Status: DC | PRN

## 2017-07-28 NOTE — Op Note (Signed)
[facility] Patient Name: Melinda Porter Procedure Date: 07/28/2017 1:26 PM MRN: [id number] Endoscopist: Docia Chuck. Henrene Pastor , MD Age: 47 Referring MD:  Date of Birth: 1970/09/08 Gender: Female Account #: [id number] Procedure:                Colonoscopy, with cold snare x 1 polypectomy; with                            biopsies Indications:              Clinically significant diarrhea of unexplained                            origin Medicines:                Monitored Anesthesia Care Procedure:                Pre-Anesthesia Assessment:                           - Prior to the procedure, a History and Physical                            was performed, and patient medications and                            allergies were reviewed. The patient's tolerance of                            previous anesthesia was also reviewed. The risks                            and benefits of the procedure and the sedation                            options and risks were discussed with the patient.                            All questions were answered, and informed consent                            was obtained. Prior Anticoagulants: The patient has                            taken no previous anticoagulant or antiplatelet                            agents. ASA Grade Assessment: II - A patient with                            mild systemic disease. After reviewing the risks                            and benefits, the patient was deemed in  satisfactory condition to undergo the procedure.                           After obtaining informed consent, the colonoscope                            was passed under direct vision. Throughout the                            procedure, the patient's blood pressure, pulse, and                            oxygen saturations were monitored continuously. The                            Colonoscope was introduced through the anus and                  advanced to the the cecum, identified by                            appendiceal orifice and ileocecal valve. The                            terminal ileum, ileocecal valve, appendiceal                            orifice, and rectum were photographed. The quality                            of the bowel preparation was excellent. The                            colonoscopy was performed without difficulty. The                            patient tolerated the procedure well. The bowel                            preparation used was SUPREP. Scope In: 1:30:24 PM Scope Out: 1:48:03 PM Scope Withdrawal Time: 0 hours 14 minutes 38 seconds  Total Procedure Duration: 0 hours 17 minutes 39 seconds  Findings:                 The terminal ileum appeared normal.                           A 3 mm polyp was found in the descending colon. The                            polyp was sessile. The polyp was removed with a                            cold snare. Resection and retrieval were complete.  There was scarring in the segmental distribution in                            the left colon consistent with prior history of                            ischemic colitis. No active inflammation.The entire                            examined colon appeared otherwise normal on direct                            and retroflexion views. Biopsies for histology were                            taken with a cold forceps from the entire colon for                            evaluation of microscopic colitis Small internal                            hemorrhoids present. Complications:            No immediate complications. Estimated blood loss:                            None. Estimated Blood Loss:     Estimated blood loss: none. Impression:               - The examined portion of the ileum was normal.                           - One 3 mm polyp in the descending colon, removed                             with a cold snare. Resected and retrieved.                           - Signal scarring from prior history of ischemic                            colitis. No active inflammation.The entire examined                            colon is otherwise normal on direct and                            retroflexion views. Recommendation:           - Repeat colonoscopy in 5-10 years for surveillance.                           - Patient has a contact number available for  emergencies. The signs and symptoms of potential                            delayed complications were discussed with the                            patient. Return to normal activities tomorrow.                            Written discharge instructions were provided to the                            patient.                           - Resume previous diet.                           - Continue present medications.                           - Await pathology results. We will send you a                            letter with the results and further recommendations                            if indicated.                           - Prescribed Lomotil once to 3 times daily as                            needed for diarrhea; #60; 2 refills                           - Office follow-up with Dr. Henrene Pastor in 6 weeks Docia Chuck. Henrene Pastor, MD 07/28/2017 1:55:12 PM This report has been signed electronically.

## 2017-07-28 NOTE — Patient Instructions (Signed)
Discharge instructions given. Biopsies taken. Handout on polyps. Resume previous medications. YOU HAD AN ENDOSCOPIC PROCEDURE TODAY AT [facility]:   Refer to the procedure report that was given to you for any specific questions about what was found during the examination.  If the procedure report does not answer your questions, please call your gastroenterologist to clarify.  If you requested that your care partner not be given the details of your procedure findings, then the procedure report has been included in a sealed envelope for you to review at your convenience later.  YOU SHOULD EXPECT: Some feelings of bloating in the abdomen. Passage of more gas than usual.  Walking can help get rid of the air that was put into your GI tract during the procedure and reduce the bloating. If you had a lower endoscopy (such as a colonoscopy or flexible sigmoidoscopy) you may notice spotting of blood in your stool or on the toilet paper. If you underwent a bowel prep for your procedure, you may not have a normal bowel movement for a few days.  Please Note:  You might notice some irritation and congestion in your nose or some drainage.  This is from the oxygen used during your procedure.  There is no need for concern and it should clear up in a day or so.  SYMPTOMS TO REPORT IMMEDIATELY:   Following lower endoscopy (colonoscopy or flexible sigmoidoscopy):  Excessive amounts of blood in the stool  Significant tenderness or worsening of abdominal pains  Swelling of the abdomen that is new, acute  Fever of 100F or higher   For urgent or emergent issues, a gastroenterologist can be reached at any hour by calling [phone].   DIET:  We do recommend a small meal at first, but then you may proceed to your regular diet.  Drink plenty of fluids but you should avoid alcoholic beverages for 24 hours.  ACTIVITY:  You should plan to take it easy for the rest of today and you should NOT DRIVE  or use heavy machinery until tomorrow (because of the sedation medicines used during the test).    FOLLOW UP: Our staff will call the number listed on your records the next business day following your procedure to check on you and address any questions or concerns that you may have regarding the information given to you following your procedure. If we do not reach you, we will leave a message.  However, if you are feeling well and you are not experiencing any problems, there is no need to return our call.  We will assume that you have returned to your regular daily activities without incident.  If any biopsies were taken you will be contacted by phone or by letter within the next 1-3 weeks.  Please call us at [phone] if you have not heard about the biopsies in 3 weeks.    SIGNATURES/CONFIDENTIALITY: You and/or your care partner have signed paperwork which will be entered into your electronic medical record.  These signatures attest to the fact that that the information above on your After Visit Summary has been reviewed and is understood.  Full responsibility of the confidentiality of this discharge information lies with you and/or your care-partner.

## 2017-07-28 NOTE — Progress Notes (Signed)
Called to room to assist during endoscopic procedure.  Patient ID and intended procedure confirmed with present staff. Received instructions for my participation in the procedure from the performing physician.  

## 2017-07-28 NOTE — Progress Notes (Signed)
Report to PACU, RN, vss, BBS= Clear.  

## 2017-07-29 ENCOUNTER — Telehealth: Payer: Self-pay

## 2017-07-29 NOTE — Telephone Encounter (Signed)
  Follow up Call-  Call back number 07/28/2017  Post procedure Call Back phone  # [phone]  Permission to leave phone message Yes  Some recent data might be hidden     Patient questions:  Do you have a fever, pain , or abdominal swelling? No. Pain Score  0 *  Have you tolerated food without any problems? Yes.    Have you been able to return to your normal activities? Yes.    Do you have any questions about your discharge instructions: Diet   No. Medications  No. Follow up visit  No.  Do you have questions or concerns about your Care? No.  Actions: * If pain score is 4 or above: No action needed, pain <4.

## 2017-07-30 DIAGNOSIS — L4050 Arthropathic psoriasis, unspecified: Secondary | ICD-10-CM | POA: Diagnosis not present

## 2017-07-31 ENCOUNTER — Ambulatory Visit: Payer: 59 | Admitting: Physician Assistant

## 2017-08-05 ENCOUNTER — Other Ambulatory Visit: Payer: Self-pay | Admitting: Family Medicine

## 2017-08-05 ENCOUNTER — Encounter: Payer: Self-pay | Admitting: Internal Medicine

## 2017-08-13 ENCOUNTER — Encounter: Payer: Self-pay | Admitting: Family Medicine

## 2017-08-14 ENCOUNTER — Encounter: Payer: 59 | Admitting: Internal Medicine

## 2017-08-20 DIAGNOSIS — R0602 Shortness of breath: Secondary | ICD-10-CM | POA: Diagnosis not present

## 2017-08-20 DIAGNOSIS — Z79899 Other long term (current) drug therapy: Secondary | ICD-10-CM | POA: Diagnosis not present

## 2017-08-20 DIAGNOSIS — R635 Abnormal weight gain: Secondary | ICD-10-CM | POA: Diagnosis not present

## 2017-08-20 DIAGNOSIS — R5383 Other fatigue: Secondary | ICD-10-CM | POA: Diagnosis not present

## 2017-08-22 ENCOUNTER — Encounter: Payer: Self-pay | Admitting: Family Medicine

## 2017-08-27 ENCOUNTER — Encounter: Payer: 59 | Admitting: Family Medicine

## 2017-08-27 DIAGNOSIS — L4050 Arthropathic psoriasis, unspecified: Secondary | ICD-10-CM | POA: Diagnosis not present

## 2017-09-12 ENCOUNTER — Ambulatory Visit: Payer: 59 | Admitting: Family Medicine

## 2017-09-12 ENCOUNTER — Encounter: Payer: Self-pay | Admitting: Family Medicine

## 2017-09-12 VITALS — BP 112/80 | HR 74 | Temp 97.9°F

## 2017-09-12 DIAGNOSIS — N3944 Nocturnal enuresis: Secondary | ICD-10-CM

## 2017-09-12 DIAGNOSIS — I10 Essential (primary) hypertension: Secondary | ICD-10-CM | POA: Diagnosis not present

## 2017-09-12 DIAGNOSIS — R350 Frequency of micturition: Secondary | ICD-10-CM | POA: Diagnosis not present

## 2017-09-12 DIAGNOSIS — F419 Anxiety disorder, unspecified: Secondary | ICD-10-CM | POA: Diagnosis not present

## 2017-09-12 DIAGNOSIS — E162 Hypoglycemia, unspecified: Secondary | ICD-10-CM

## 2017-09-12 LAB — POC URINALSYSI DIPSTICK (AUTOMATED)
BILIRUBIN UA: NEGATIVE
GLUCOSE UA: POSITIVE — AB
Ketones, UA: NEGATIVE
Leukocytes, UA: NEGATIVE
NITRITE UA: NEGATIVE
PH UA: 6 (ref 5.0–8.0)
Protein, UA: NEGATIVE
RBC UA: NEGATIVE
UROBILINOGEN UA: 0.2 U/dL

## 2017-09-12 LAB — COMPREHENSIVE METABOLIC PANEL
ALBUMIN: 4.6 g/dL (ref 3.5–5.2)
ALT: 14 U/L (ref 0–35)
AST: 13 U/L (ref 0–37)
Alkaline Phosphatase: 63 U/L (ref 39–117)
BUN: 9 mg/dL (ref 6–23)
CALCIUM: 9.4 mg/dL (ref 8.4–10.5)
CO2: 23 meq/L (ref 19–32)
Chloride: 111 mEq/L (ref 96–112)
Creatinine, Ser: 0.8 mg/dL (ref 0.40–1.20)
GFR: 82.38 mL/min (ref >60.00)
Glucose, Bld: 103 mg/dL — ABNORMAL HIGH (ref 70–99)
POTASSIUM: 4.1 meq/L (ref 3.5–5.1)
Sodium: 143 mEq/L (ref 135–145)
Total Bilirubin: 0.4 mg/dL (ref 0.2–1.2)
Total Protein: 7.1 g/dL (ref 6.0–8.3)

## 2017-09-12 LAB — TSH: TSH: 3.49 u[IU]/mL (ref 0.35–4.50)

## 2017-09-12 LAB — HEMOGLOBIN A1C: HEMOGLOBIN A1C: 5.6 % (ref 4.6–6.5)

## 2017-09-12 MED ORDER — CLONAZEPAM 1 MG PO TABS: 1.0000 mg | ORAL_TABLET | Freq: Three times a day (TID) | ORAL | 1 refills | Status: DC | PRN

## 2017-09-12 NOTE — Patient Instructions (Signed)
Good to see you today  I will notify you of labs   Do Kegels regularly to help with pelvic floor muscles   Kegel Exercises Kegel exercises help strengthen the muscles that support the rectum, vagina, small intestine, bladder, and uterus. Doing Kegel exercises can help:  Improve bladder and bowel control.  Improve sexual response.  Reduce problems and discomfort during pregnancy.  Kegel exercises involve squeezing your pelvic floor muscles, which are the same muscles you squeeze when you try to stop the flow of urine. The exercises can be done while sitting, standing, or lying down, but it is best to vary your position. Phase 1 exercises 1. Squeeze your pelvic floor muscles tight. You should feel a tight lift in your rectal area. If you are a female, you should also feel a tightness in your vaginal area. Keep your stomach, buttocks, and legs relaxed. 2. Hold the muscles tight for up to 10 seconds. 3. Relax your muscles. Repeat this exercise 50 times a day or as many times as told by your health care provider. Continue to do this exercise for at least 4-6 weeks or for as long as told by your health care provider. This information is not intended to replace advice given to you by your health care provider. Make sure you discuss any questions you have with your health care provider. Document Released: 03/18/2012 Document Revised: 11/25/2015 Document Reviewed: 02/19/2015 Elsevier Interactive Patient Education  [employer].
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
## 2017-09-15 ENCOUNTER — Other Ambulatory Visit: Payer: Self-pay | Admitting: Family Medicine

## 2017-09-16 ENCOUNTER — Encounter: Payer: Self-pay | Admitting: Family Medicine

## 2017-09-16 DIAGNOSIS — R5383 Other fatigue: Secondary | ICD-10-CM | POA: Diagnosis not present

## 2017-09-16 DIAGNOSIS — E785 Hyperlipidemia, unspecified: Secondary | ICD-10-CM | POA: Diagnosis not present

## 2017-09-17 ENCOUNTER — Ambulatory Visit: Payer: 59 | Admitting: Internal Medicine

## 2017-09-17 NOTE — Telephone Encounter (Signed)
Order states take 1 tab

## 2017-09-24 DIAGNOSIS — L4050 Arthropathic psoriasis, unspecified: Secondary | ICD-10-CM | POA: Diagnosis not present

## 2017-09-30 ENCOUNTER — Encounter: Payer: Self-pay | Admitting: Family Medicine

## 2017-10-02 ENCOUNTER — Other Ambulatory Visit: Payer: Self-pay | Admitting: Family Medicine

## 2017-10-02 ENCOUNTER — Other Ambulatory Visit (INDEPENDENT_AMBULATORY_CARE_PROVIDER_SITE_OTHER): Payer: 59

## 2017-10-02 DIAGNOSIS — R5383 Other fatigue: Secondary | ICD-10-CM

## 2017-10-02 DIAGNOSIS — N3090 Cystitis, unspecified without hematuria: Secondary | ICD-10-CM

## 2017-10-02 DIAGNOSIS — R350 Frequency of micturition: Secondary | ICD-10-CM

## 2017-10-02 LAB — URINALYSIS, ROUTINE W REFLEX MICROSCOPIC
Bilirubin Urine: NEGATIVE
HGB URINE DIPSTICK: NEGATIVE
Ketones, ur: NEGATIVE
NITRITE: POSITIVE — AB
Specific Gravity, Urine: >=1.03 — AB (ref 1.000–1.030)
TOTAL PROTEIN, URINE-UPE24: NEGATIVE
URINE GLUCOSE: NEGATIVE
Urobilinogen, UA: 0.2 (ref 0.0–1.0)
pH: 6.5 (ref 5.0–8.0)

## 2017-10-02 LAB — CBC WITH DIFFERENTIAL/PLATELET
BASOS ABS: 0.2 10*3/uL — AB (ref 0.0–0.1)
Basophils Relative: 2.6 % (ref 0.0–3.0)
EOS ABS: 0.3 10*3/uL (ref 0.0–0.7)
Eosinophils Relative: 3.3 % (ref 0.0–5.0)
HEMATOCRIT: 40.5 % (ref 36.0–46.0)
Hemoglobin: 13.3 g/dL (ref 12.0–15.0)
LYMPHS ABS: 2.1 10*3/uL (ref 0.7–4.0)
LYMPHS PCT: 26.1 % (ref 12.0–46.0)
MCHC: 32.8 g/dL (ref 30.0–36.0)
MCV: 73.8 fl — ABNORMAL LOW (ref 78.0–100.0)
Monocytes Absolute: 0.4 10*3/uL (ref 0.1–1.0)
Monocytes Relative: 4.7 % (ref 3.0–12.0)
NEUTROS PCT: 63.3 % (ref 43.0–77.0)
Neutro Abs: 5 10*3/uL (ref 1.4–7.7)
Platelets: 165 10*3/uL (ref 150.0–400.0)
RBC: 5.48 Mil/uL — ABNORMAL HIGH (ref 3.87–5.11)
RDW: 17 % — ABNORMAL HIGH (ref 11.5–15.5)
WBC: 7.9 10*3/uL (ref 4.0–10.5)

## 2017-10-02 LAB — BASIC METABOLIC PANEL
BUN: 12 mg/dL (ref 6–23)
CALCIUM: 9.2 mg/dL (ref 8.4–10.5)
CO2: 24 mEq/L (ref 19–32)
CREATININE: 0.9 mg/dL (ref 0.40–1.20)
Chloride: 110 mEq/L (ref 96–112)
GFR: 71.89 mL/min (ref >60.00)
Glucose, Bld: 103 mg/dL — ABNORMAL HIGH (ref 70–99)
Potassium: 3.7 mEq/L (ref 3.5–5.1)
Sodium: 141 mEq/L (ref 135–145)

## 2017-10-03 ENCOUNTER — Telehealth: Payer: Self-pay

## 2017-10-03 MED ORDER — NITROFURANTOIN MONOHYD MACRO 100 MG PO CAPS: 100.0000 mg | ORAL_CAPSULE | Freq: Two times a day (BID) | ORAL | 0 refills | Status: DC

## 2017-10-03 NOTE — Addendum Note (Signed)
Addended by: Clarene Reamer B on: 10/03/2017 01:19 PM   Modules accepted: Orders

## 2017-10-03 NOTE — Telephone Encounter (Signed)
Mychart message sent to patient.

## 2017-10-03 NOTE — Telephone Encounter (Signed)
Copied from [place] [id number]. Topic: Quick Communication - Lab Results >> Oct 03, 2017 11:47 AM Melinda Porter wrote: Pt called in for her lab results.  CB: [phone]

## 2017-10-06 ENCOUNTER — Other Ambulatory Visit: Payer: Self-pay | Admitting: Family Medicine

## 2017-10-06 DIAGNOSIS — Z1231 Encounter for screening mammogram for malignant neoplasm of breast: Secondary | ICD-10-CM

## 2017-10-13 ENCOUNTER — Encounter: Payer: Self-pay | Admitting: Family Medicine

## 2017-10-22 DIAGNOSIS — L4050 Arthropathic psoriasis, unspecified: Secondary | ICD-10-CM | POA: Diagnosis not present

## 2017-11-04 DIAGNOSIS — L4050 Arthropathic psoriasis, unspecified: Secondary | ICD-10-CM | POA: Diagnosis not present

## 2017-11-04 DIAGNOSIS — M797 Fibromyalgia: Secondary | ICD-10-CM | POA: Diagnosis not present

## 2017-11-04 DIAGNOSIS — R768 Other specified abnormal immunological findings in serum: Secondary | ICD-10-CM | POA: Diagnosis not present

## 2017-11-10 ENCOUNTER — Other Ambulatory Visit: Payer: Self-pay | Admitting: Family Medicine

## 2017-11-10 DIAGNOSIS — F419 Anxiety disorder, unspecified: Secondary | ICD-10-CM

## 2017-11-13 ENCOUNTER — Encounter: Payer: Self-pay | Admitting: Family Medicine

## 2017-11-13 NOTE — Telephone Encounter (Signed)
Pt following up on refill request clonazePAM (KLONOPIN) 1 MG tablet  Pt will be out by the weekend and Hilda Blades is out until Monday   [id number] - [place], [place] [phone] (Phone) [phone] (Fax)

## 2017-11-13 NOTE — Telephone Encounter (Signed)
clonazepam refill Last Refill:09/12/17 # 90 1 RF Last OV: 09/12/17 PCP: Melinda Reamer FNP [facility]acy[facility] [phone] [facility]

## 2017-11-14 NOTE — Telephone Encounter (Signed)
Sent. Thanks.   

## 2017-11-14 NOTE — Telephone Encounter (Signed)
Will forward to Dr. Damita Dunnings to see if he will refill medication since patient will be out before Jackelyn Poling returns on Monday.

## 2017-11-20 ENCOUNTER — Ambulatory Visit: Payer: 59

## 2017-11-20 DIAGNOSIS — L4050 Arthropathic psoriasis, unspecified: Secondary | ICD-10-CM | POA: Diagnosis not present

## 2017-11-22 DIAGNOSIS — Z23 Encounter for immunization: Secondary | ICD-10-CM | POA: Diagnosis not present

## 2017-12-03 ENCOUNTER — Ambulatory Visit: Payer: 59 | Admitting: Internal Medicine

## 2017-12-03 ENCOUNTER — Encounter: Payer: Self-pay | Admitting: Internal Medicine

## 2017-12-03 ENCOUNTER — Telehealth: Payer: Self-pay | Admitting: Family Medicine

## 2017-12-03 VITALS — BP 126/82 | HR 97 | Temp 98.2°F | Wt 220.0 lb

## 2017-12-03 DIAGNOSIS — R1084 Generalized abdominal pain: Secondary | ICD-10-CM | POA: Diagnosis not present

## 2017-12-03 DIAGNOSIS — R197 Diarrhea, unspecified: Secondary | ICD-10-CM | POA: Diagnosis not present

## 2017-12-03 DIAGNOSIS — R059 Cough, unspecified: Secondary | ICD-10-CM

## 2017-12-03 DIAGNOSIS — R140 Abdominal distension (gaseous): Secondary | ICD-10-CM | POA: Diagnosis not present

## 2017-12-03 DIAGNOSIS — R110 Nausea: Secondary | ICD-10-CM

## 2017-12-03 DIAGNOSIS — R0982 Postnasal drip: Secondary | ICD-10-CM

## 2017-12-03 DIAGNOSIS — R05 Cough: Secondary | ICD-10-CM

## 2017-12-03 NOTE — Telephone Encounter (Signed)
Copied from [place] [id number]. Topic: General - Other >> Dec 03, 2017  1:47 PM Lennox Solders wrote: Reason for CRM: [facility] [address] is calling. Vivien Rota is calling to clarify, the patient stated she is taking topamax 50 mg four times a day instead of twice a day  if so pharm will  needs new rx

## 2017-12-03 NOTE — Patient Instructions (Signed)
Diarrhea, Adult Diarrhea is when you have loose and water poop (stool) often. Diarrhea can make you feel weak and cause you to get dehydrated. Dehydration can make you tired and thirsty, make you have a dry mouth, and make it so you pee (urinate) less often. Diarrhea often lasts 2-3 days. However, it can last longer if it is a sign of something more serious. It is important to treat your diarrhea as told by your doctor. Follow these instructions at home: Eating and drinking  Follow these recommendations as told by your doctor:  Take an oral rehydration solution (ORS). This is a drink that is sold at pharmacies and stores.  Drink clear fluids, such as: ? Water. ? Ice chips. ? Diluted fruit juice. ? Low-calorie sports drinks.  Eat bland, easy-to-digest foods in small amounts as you are able. These foods include: ? Bananas. ? Applesauce. ? Rice. ? Low-fat (lean) meats. ? Toast. ? Crackers.  Avoid drinking fluids that have a lot of sugar or caffeine in them.  Avoid alcohol.  Avoid spicy or fatty foods.  General instructions   Drink enough fluid to keep your pee (urine) clear or pale yellow.  Wash your hands often. If you cannot use soap and water, use hand sanitizer.  Make sure that all people in your home wash their hands well and often.  Take over-the-counter and prescription medicines only as told by your doctor.  Rest at home while you get better.  Watch your condition for any changes.  Take a warm bath to help with any burning or pain from having diarrhea.  Keep all follow-up visits as told by your doctor. This is important. Contact a doctor if:  You have a fever.  Your diarrhea gets worse.  You have new symptoms.  You cannot keep fluids down.  You feel light-headed or dizzy.  You have a headache.  You have muscle cramps. Get help right away if:  You have chest pain.  You feel very weak or you pass out (faint).  You have bloody or black poop or  poop that look like tar.  You have very bad pain, cramping, or bloating in your belly (abdomen).  You have trouble breathing or you are breathing very quickly.  Your heart is beating very quickly.  Your skin feels cold and clammy.  You feel confused.  You have signs of dehydration, such as: ? Dark pee, hardly any pee, or no pee. ? Cracked lips. ? Dry mouth. ? Sunken eyes. ? Sleepiness. ? Weakness. This information is not intended to replace advice given to you by your health care provider. Make sure you discuss any questions you have with your health care provider. Document Released: 09/18/2007 Document Revised: 10/20/2015 Document Reviewed: 12/06/2014 Elsevier Interactive Patient Education  2018 [employer].

## 2017-12-03 NOTE — Progress Notes (Signed)
Subjective:    Patient ID: Melinda Porter, female    DOB: 1970/05/21, 47 y.o.   MRN: [id number]  HPI  Pt presents to the clinic today with c/o abdominal pain, nausea and diarrhea. She reports this started 2 weeks ago but has been an intermittent issue for months. Her stools are loose at times, but she denies vomiting or blood in her stool. She reports anything she eats makes her have loose stools, nausea, bloating. She denies, fever, chills or body aches. She denies recent changes in diet or medications. She denies recent travel. She has not had sick contacts with similar symptoms. 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 She is taking Esomeprazole for hiatal hernia. CT scan from 06/2017 reviewed.  She also reports cough. This started 4 days ago. The cough is non productive. She denies runny nose, nasal congestion, ear pain or sore throat. She has not taken anything OTC for this.  Review of Systems      Past Medical History:  Diagnosis Date  . Anxiety   . Arthritis   . Depression   . Endometriosis   . Hiatal hernia   . Hyperlipemia   . Hypertension   . Lupus ([place])   . PTSD (post-traumatic stress disorder)     Current Outpatient Medications  Medication Sig Dispense Refill  . abatacept (ORENCIA) 250 MG injection Inject into the vein.    . Cholecalciferol 1000 units capsule Take 1 capsule (1,000 Units total) by mouth daily. 90 capsule 3  . clonazePAM (KLONOPIN) 1 MG tablet TAKE 1 TABLET BY MOUTH 3 (THREE) TIMES DAILY AS NEEDED. FOR ANXIETY 90 tablet 0  . cyclobenzaprine (FLEXERIL) 10 MG tablet Take 1 tablet (10 mg total) by mouth 3 (three) times daily as needed for muscle spasms. 60 tablet 1  . diphenoxylate-atropine (LOMOTIL) 2.5-0.025 MG tablet Take 1 tablet by mouth 3 (three) times daily as needed for diarrhea or loose stools. 60 tablet 2  . esomeprazole (NEXIUM) 40 MG capsule Take 1 capsule (40 mg total) by mouth  daily. 90 capsule 3  . hydroxychloroquine (PLAQUENIL) 200 MG tablet Take 200 mg by mouth 2 (two) times daily.    [name] ibuprofen (ADVIL,MOTRIN) 600 MG tablet Take 1 tablet (600 mg total) by mouth every 6 (six) hours as needed. 20 tablet 0  . metoCLOPramide (REGLAN) 10 MG tablet Take one tablet 20 minutes before drinking each prep 2 tablet 0  . nystatin (MYCOSTATIN/NYSTOP) 100000 UNIT/GM POWD For use as recommended per package insert to affected areas (under breast) 15 g 0  . ondansetron (ZOFRAN ODT) 4 MG disintegrating tablet Take 1 tablet (4 mg total) by mouth every 8 (eight) hours as needed for nausea or vomiting. 20 tablet 0  . sertraline (ZOLOFT) 100 MG tablet TAKE 1.5 TABLETS (150 MG TOTAL) DAILY BY MOUTH. (Patient taking differently: Take 200 mg by mouth daily. ) 135 tablet 1  . topiramate (TOPAMAX) 50 MG tablet Take 1 tablet (50 mg total) by mouth 2 (two) times daily. (Patient taking differently: Take 100 mg by mouth 2 (two) times daily. ) 180 tablet 2   Current Facility-Administered Medications  Medication Dose Route Frequency Provider Last Rate Last Dose  . 0.9 %  sodium chloride infusion  500 mL Intravenous Once Irene Shipper, MD        Allergies  Allergen Reactions  . Lyrica [Pregabalin] Shortness Of Breath  . Sulfa Antibiotics Rash  . Ciprofloxacin  Nausea Only  . Gabapentin     Stroke like symptoms  . Nsaids Other (See Comments)    Liver problems- can't take tylenol, ibuprofen etc.  . Tolmetin     Other reaction(s): Other (See Comments) Liver problems- can't take tylenol, ibuprofen etc.  . Codeine Rash  . Humira [Adalimumab] Rash    Worsening of Lupus rash.  . Morphine And Related Rash    "Wired up"  . Penicillins Rash    Has patient had a PCN reaction causing immediate rash, facial/tongue/throat swelling, SOB or lightheadedness with hypotension: NO Has patient had a PCN reaction causing severe rash involving mucus membranes or skin necrosis: NO Has patient had a PCN  reaction that required hospitalization NO Has patient had a PCN reaction occurring within the last 10 years: NO If all of the above answers are "NO", then may proceed with Cephalosporin use.    Family History  Problem Relation Age of Onset  . Colon polyps Maternal Grandmother   . Lung cancer Maternal Grandmother   . Liver disease Father        agent orange  . Diabetes Maternal Grandfather   . Heart disease Maternal Grandfather   . Hypertension Maternal Grandfather   . Colon cancer Maternal Uncle        great uncle  . Breast cancer Neg Hx     Social History   Socioeconomic History  . Marital status: Divorced    Spouse name: Not on file  . Number of children: 1  . Years of education: Not on file  . Highest education level: Not on file  Occupational History  . 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 Financial resource strain: Not on file  . Food insecurity:    Worry: Not on file    Inability: Not on file  . Transportation needs:    Medical: Not on file    Non-medical: Not on file  Tobacco Use  . Smoking status: Former Smoker    Types: Cigarettes    Last attempt to quit: 03/09/1999    Years since quitting: 18.7  . Smokeless tobacco: Never Used  Substance and Sexual Activity  . Alcohol use: No    Alcohol/week: 0.0 standard drinks  . Drug use: No  . Sexual activity: Yes    Partners: Male    Birth control/protection: Surgical  Lifestyle  . Physical activity:    Days per week: Not on file    Minutes per session: Not on file  . Stress: Not on file  Relationships  . Social connections:    Talks on phone: Not on file    Gets together: Not on file    Attends religious service: Not on file    Active member of club or organization: Not on file    Attends meetings of clubs or organizations: Not on file    Relationship status: Not on file  . Intimate partner violence:    Fear of current or ex partner: Not on file    Emotionally abused: Not on file     Physically abused: Not on file    Forced sexual activity: Not on file  Other Topics Concern  . Not on file  Social History Narrative  . Not on file     Constitutional: Denies fever, malaise, fatigue, headache or abrupt weight changes.  HEENT: Denies eye pain, eye redness, ear pain, ringing in the ears, wax buildup, runny nose, nasal congestion, bloody nose, or sore throat. Respiratory:  Pt reports cough. Denies difficulty breathing, shortness of breath, or sputum production.   Cardiovascular: Denies chest pain, chest tightness, palpitations or swelling in the hands or feet.  Gastrointestinal: Pt reports abdominal pain, diarrhea and nausea. Denies bloating, constipation, or blood in the stool.  GU: Denies urgency, frequency, pain with urination, burning sensation, blood in urine, odor or discharge.  No other specific complaints in a complete review of systems (except as listed in HPI above).  Objective:   Physical Exam   BP 126/82   Pulse 97   Temp 98.2 F (36.8 C) (Oral)   Wt 220 lb (99.8 kg)   LMP 06/23/2009   SpO2 98%   BMI 37.76 kg/m  Wt Readings from Last 3 Encounters:  12/03/17 220 lb (99.8 kg)  07/28/17 243 lb (110.2 kg)  07/22/17 243 lb (110.2 kg)    General: Appears her stated age, obese in NAD. HEENT: Throat/Mouth: Teeth present, mucosa pink and moist, + PND, no exudate, lesions or ulcerations noted.  Neck:  No adenopathy noted. Pulmonary/Chest: Normal effort and positive vesicular breath sounds. No respiratory distress. No wheezes, rales or ronchi noted.  Abdomen: Soft and nontender. Normal bowel sounds. No distention or masses noted.  BMET    Component Value Date/Time   NA 141 10/02/2017 1227   K 3.7 10/02/2017 1227   CL 110 10/02/2017 1227   CO2 24 10/02/2017 1227   GLUCOSE 103 (H) 10/02/2017 1227   BUN 12 10/02/2017 1227   CREATININE 0.90 10/02/2017 1227   CREATININE 0.70 04/09/2017 1619   CALCIUM 9.2 10/02/2017 1227   GFRNONAA >60 07/10/2017 0210    GFRAA >60 07/10/2017 0210    Lipid Panel  No results found for: CHOL, TRIG, HDL, CHOLHDL, VLDL, LDLCALC  CBC    Component Value Date/Time   WBC 7.9 10/02/2017 1227   RBC 5.48 (H) 10/02/2017 1227   HGB 13.3 10/02/2017 1227   HCT 40.5 10/02/2017 1227   PLT 165.0 10/02/2017 1227   MCV 73.8 (L) 10/02/2017 1227   MCH 24.1 (L) 07/10/2017 0210   MCHC 32.8 10/02/2017 1227   RDW 17.0 (H) 10/02/2017 1227   LYMPHSABS 2.1 10/02/2017 1227   MONOABS 0.4 10/02/2017 1227   EOSABS 0.3 10/02/2017 1227   BASOSABS 0.2 (H) 10/02/2017 1227    Hgb A1C Lab Results  Component Value Date   HGBA1C 5.6 09/12/2017           Assessment & Plan:   Abdominal Pain, Nausea, Bloating, Diarrhea:  Will check GI pathogen panel, Ova and Parasite, and H Pylori ? Side effect from [place] Continue Esomeprazole If symptoms persist, call and schedule follow up with GI  Cough secondary to PND:  Start Zyrtec OTC  Return precautions discussed Webb Silversmith, NP

## 2017-12-04 NOTE — Telephone Encounter (Signed)
Looks like she is taking 100 mg twice a day (2 x 50 mg BID). Please call and clarify with patient. If she is taking twice a day, ask if she would like to take the 100 mg dose twice a day instead of two of the 50 mg.

## 2017-12-08 ENCOUNTER — Encounter: Payer: Self-pay | Admitting: Family Medicine

## 2017-12-08 NOTE — Telephone Encounter (Signed)
Pt checking status on seeing what she can take for her cold.

## 2017-12-09 ENCOUNTER — Other Ambulatory Visit: Payer: Self-pay | Admitting: Family Medicine

## 2017-12-09 MED ORDER — BENZONATATE 100 MG PO CAPS: 100.0000 mg | ORAL_CAPSULE | Freq: Three times a day (TID) | ORAL | 0 refills | Status: DC | PRN

## 2017-12-09 MED ORDER — TOPIRAMATE 100 MG PO TABS: 100.0000 mg | ORAL_TABLET | Freq: Two times a day (BID) | ORAL | 0 refills | Status: DC

## 2017-12-09 NOTE — Telephone Encounter (Signed)
PEC called tessalon perle was sent to [facility] but topamax was not; pt is out of topamax. Need to clarify instructions. Is the topamax prescription supposed to be Topamax 100 mg taking one tab bid? Glenda Chroman FNP out of office today and pt is out of medication. Pt needs cb when [facility] [place] [facility] [address].

## 2017-12-09 NOTE — Telephone Encounter (Signed)
Copied from [place] [id number]. Topic: Quick Communication - Rx Refill/Question >> Dec 09, 2017 10:52 AM Melinda Porter wrote: Pt states the medication was supposed to be called in as well today  Medication: topiramate (TOPAMAX) 50 MG tablet [[id number]]   Has the patient contacted their pharmacy? Yes.   (Agent: If no, request that the patient contact the pharmacy for the refill.) (Agent: If yes, when and what did the pharmacy advise?)  Preferred Pharm[facility] (with phone number [facility] [place]  Agent: Please be advised that RX refills may take up to 3 business days. We ask that you follow-up with your pharmacy.

## 2017-12-09 NOTE — Progress Notes (Unsigned)
Please check with patient. See mychart messages.  She has 2 different sigs listed for topamax.  Verify her total daily dose/total milligrams and let me know so I can send the rx.   Thanks.  Melinda Porter  Patient was taking two (2) 50 mg tabs twice a day and Debbie switched her to a 100 mg tab twice daily.  Mike Craze, CMA  12/09/2017.  topamax rx sent.   Thanks.  Melinda Porter 5:08 PM

## 2017-12-10 ENCOUNTER — Encounter: Payer: Self-pay | Admitting: Family Medicine

## 2017-12-12 ENCOUNTER — Other Ambulatory Visit: Payer: Self-pay | Admitting: Family Medicine

## 2017-12-12 ENCOUNTER — Encounter: Payer: Self-pay | Admitting: Family Medicine

## 2017-12-12 DIAGNOSIS — F419 Anxiety disorder, unspecified: Secondary | ICD-10-CM

## 2017-12-12 MED ORDER — CLONAZEPAM 1 MG PO TABS: ORAL_TABLET | ORAL | 0 refills | Status: DC

## 2017-12-19 DIAGNOSIS — Z79899 Other long term (current) drug therapy: Secondary | ICD-10-CM | POA: Diagnosis not present

## 2017-12-19 DIAGNOSIS — R635 Abnormal weight gain: Secondary | ICD-10-CM | POA: Diagnosis not present

## 2017-12-19 DIAGNOSIS — Z6837 Body mass index (BMI) 37.0-37.9, adult: Secondary | ICD-10-CM | POA: Diagnosis not present

## 2017-12-19 DIAGNOSIS — L4050 Arthropathic psoriasis, unspecified: Secondary | ICD-10-CM | POA: Diagnosis not present

## 2017-12-19 LAB — CBC AND DIFFERENTIAL
HCT: 42 (ref 36–46)
Hemoglobin: 12.8 (ref 12.0–16.0)
Platelets: 159 (ref 150–399)
WBC: 7.3

## 2017-12-19 LAB — HEPATIC FUNCTION PANEL
ALT: 14 (ref 7–35)
AST: 17 (ref 13–35)
Alkaline Phosphatase: 82 (ref 25–125)
Bilirubin, Total: 0.3

## 2017-12-19 LAB — BASIC METABOLIC PANEL
BUN: 11 (ref 4–21)
CREATININE: 0.9 (ref 0.5–1.1)
Glucose: 84
POTASSIUM: 4.1 (ref 3.4–5.3)
Sodium: 143 (ref 137–147)

## 2017-12-22 ENCOUNTER — Ambulatory Visit: Payer: 59

## 2017-12-24 ENCOUNTER — Encounter: Payer: Self-pay | Admitting: Family Medicine

## 2017-12-26 ENCOUNTER — Encounter: Payer: Self-pay | Admitting: Family Medicine

## 2017-12-28 ENCOUNTER — Encounter: Payer: Self-pay | Admitting: Family Medicine

## 2017-12-29 ENCOUNTER — Encounter: Payer: Self-pay | Admitting: Family Medicine

## 2017-12-29 ENCOUNTER — Ambulatory Visit: Payer: 59 | Admitting: Family Medicine

## 2017-12-29 ENCOUNTER — Encounter: Payer: Self-pay | Admitting: Emergency Medicine

## 2017-12-29 VITALS — BP 102/76 | HR 82 | Temp 98.2°F | Ht 64.0 in | Wt 217.0 lb

## 2017-12-29 DIAGNOSIS — Z789 Other specified health status: Secondary | ICD-10-CM | POA: Diagnosis not present

## 2017-12-29 DIAGNOSIS — Z23 Encounter for immunization: Secondary | ICD-10-CM | POA: Diagnosis not present

## 2017-12-29 DIAGNOSIS — R51 Headache: Secondary | ICD-10-CM | POA: Diagnosis not present

## 2017-12-29 DIAGNOSIS — B029 Zoster without complications: Secondary | ICD-10-CM

## 2017-12-29 DIAGNOSIS — R519 Headache, unspecified: Secondary | ICD-10-CM

## 2017-12-29 MED ORDER — VALACYCLOVIR HCL 1 G PO TABS: 1000.0000 mg | ORAL_TABLET | Freq: Three times a day (TID) | ORAL | 0 refills | Status: DC

## 2017-12-29 NOTE — Patient Instructions (Signed)
Pain may be from shingles, if you develop any visual change or rash around your eye- you will need to see your eye doctor immediately- call if we need to facilitate this  If you develop different symptoms, please let me know    Shingles Shingles, which is also known as herpes zoster, is an infection that causes a painful skin rash and fluid-filled blisters. Shingles is not related to genital herpes, which is a sexually transmitted infection. Shingles only develops in people who:  Have had chickenpox.  Have received the chickenpox vaccine. (This is rare.)  What are the causes? Shingles is caused by varicella-zoster virus (VZV). This is the same virus that causes chickenpox. After exposure to VZV, the virus stays in the body in an inactive (dormant) state. Shingles develops if the virus reactivates. This can happen many years after the initial exposure to VZV. It is not known what causes this virus to reactivate. What increases the risk? People who have had chickenpox or received the chickenpox vaccine are at risk for shingles. Infection is more common in people who:  Are older than age 67.  Have a weakened defense (immune) system, such as those with HIV, AIDS, or cancer.  Are taking medicines that weaken the immune system, such as transplant medicines.  Are under great stress.  What are the signs or symptoms? Early symptoms of this condition include itching, tingling, and pain in an area on your skin. Pain may be described as burning, stabbing, or throbbing. A few days or weeks after symptoms start, a painful red rash appears, usually on one side of the body in a bandlike or beltlike pattern. The rash eventually turns into fluid-filled blisters that break open, scab over, and dry up in about 2-3 weeks. At any time during the infection, you may also develop:  A fever.  Chills.  A headache.  An upset stomach.  How is this diagnosed? This condition is diagnosed with a skin exam.  Sometimes, skin or fluid samples are taken from the blisters before a diagnosis is made. These samples are examined under a microscope or sent to a lab for testing. How is this treated? There is no specific cure for this condition. Your health care provider will probably prescribe medicines to help you manage pain, recover more quickly, and avoid long-term problems. Medicines may include:  Antiviral drugs.  Anti-inflammatory drugs.  Pain medicines.  If the area involved is on your face, you may be referred to a specialist, such as an eye doctor (ophthalmologist) or an ear, nose, and throat (ENT) doctor to help you avoid eye problems, chronic pain, or disability. Follow these instructions at home: Medicines  Take medicines only as directed by your health care provider.  Apply an anti-itch or numbing cream to the affected area as directed by your health care provider. Blister and Rash Care  Take a cool bath or apply cool compresses to the area of the rash or blisters as directed by your health care provider. This may help with pain and itching.  Keep your rash covered with a loose bandage (dressing). Wear loose-fitting clothing to help ease the pain of material rubbing against the rash.  Keep your rash and blisters clean with mild soap and cool water or as directed by your health care provider.  Check your rash every day for signs of infection. These include redness, swelling, and pain that lasts or increases.  Do not pick your blisters.  Do not scratch your rash. General instructions  Rest as directed by your health care provider.  Keep all follow-up visits as directed by your health care provider. This is important.  Until your blisters scab over, your infection can cause chickenpox in people who have never had it or been vaccinated against it. To prevent this from happening, avoid contact with other people, especially: ? Babies. ? Pregnant women. ? Children who have  eczema. ? Elderly people who have transplants. ? People who have chronic illnesses, such as leukemia or AIDS. Contact a health care provider if:  Your pain is not relieved with prescribed medicines.  Your pain does not get better after the rash heals.  Your rash looks infected. Signs of infection include redness, swelling, and pain that lasts or increases. Get help right away if:  The rash is on your face or nose.  You have facial pain, pain around your eye area, or loss of feeling on one side of your face.  You have ear pain or you have ringing in your ear.  You have loss of taste.  Your condition gets worse. This information is not intended to replace advice given to you by your health care provider. Make sure you discuss any questions you have with your health care provider. Document Released: 04/01/2005 Document Revised: 11/26/2015 Document Reviewed: 02/10/2014 Elsevier Interactive Patient Education  2018 [employer].

## 2017-12-29 NOTE — Progress Notes (Signed)
Subjective:    Patient ID: Melinda Porter, female    DOB: 1970/09/26, 47 y.o.   MRN: [id number]  HPI This is a 47 yo female who presents today with lefts sided facial pain, feels like stabbing behind eye, in left cheek and left ear. Took some of her mothers medication (acyclovir) for fever blister, thinking she was getting an outbreak on her lip. Lip with numbness, tingling. Strange sensation on roof of mouth. Has had shingles in past. No visual changes, no rash.   Had headache and SOB yesterday, still with headache, SOB went away. No fever/chills.  She has had recurrent diarrhea last week. Continues to have loose stools (not watery) 5-7 times a day. Worse with certain foods.   Weight loss- continues to lose weight on supplement. Some decreased appetite with recent illness.   MMR- she is not sure of her vaccination status and requests titer  Past Medical History:  Diagnosis Date  . Anxiety   . Arthritis   . Depression   . Endometriosis   . Hiatal hernia   . Hyperlipemia   . Hypertension   . Lupus ([place])   . PTSD (post-traumatic stress disorder)    Past Surgical History:  Procedure Laterality Date  . CESAREAN SECTION  1997  . FLEXIBLE SIGMOIDOSCOPY N/A 01/06/2017   Procedure: FLEXIBLE SIGMOIDOSCOPY;  Surgeon: Yetta Flock, MD;  Location: [facility];  Service: Gastroenterology;  Laterality: N/A;  . OOPHORECTOMY Bilateral   . PARTIAL HYSTERECTOMY  07/2009  . TEMPOROMANDIBULAR JOINT SURGERY  2009   right side  . TONSILLECTOMY  2009   Family History  Problem Relation Age of Onset  . Colon polyps Maternal Grandmother   . Lung cancer Maternal Grandmother   . Liver disease Father        agent orange  . Diabetes Maternal Grandfather   . Heart disease Maternal Grandfather   . Hypertension Maternal Grandfather   . Colon cancer Maternal Uncle        great uncle  . Breast cancer Neg Hx    Social History   Tobacco Use  . Smoking status: Former Smoker    Types:  Cigarettes    Last attempt to quit: 03/09/1999    Years since quitting: 18.8  . Smokeless tobacco: Never Used  Substance Use Topics  . Alcohol use: No    Alcohol/week: 0.0 standard drinks  . Drug use: No      Review of Systems Per HPI    Objective:   Physical Exam  Constitutional: She is oriented to person, place, and time. She appears well-developed and well-nourished. No distress.  HENT:  Head: Normocephalic and atraumatic.  Right Ear: Tympanic membrane, external ear and ear canal normal.  Left Ear: Tympanic membrane, external ear and ear canal normal.  Nose: Nose normal.  Mouth/Throat: Oropharynx is clear and moist.  Eyes: Conjunctivae are normal.  Neck: Normal range of motion. Neck supple.  Cardiovascular: Normal rate, regular rhythm and normal heart sounds.  Pulmonary/Chest: Effort normal and breath sounds normal.  Lymphadenopathy:    She has no cervical adenopathy.  Neurological: She is alert and oriented to person, place, and time.  Skin: Skin is warm and dry. She is not diaphoretic.  Psychiatric: She has a normal mood and affect. Her behavior is normal. Judgment and thought content normal.  Vitals reviewed.     BP 102/76 (BP Location: Right Arm, Patient Position: Sitting, Cuff Size: Normal)   Pulse 82   Temp 98.2 F (36.8  C) (Oral)   Ht 5' 4"  (1.626 m)   Wt 217 lb (98.4 kg)   LMP 06/23/2009   SpO2 98%   BMI 37.25 kg/m  Wt Readings from Last 3 Encounters:  12/29/17 217 lb (98.4 kg)  12/03/17 220 lb (99.8 kg)  07/28/17 243 lb (110.2 kg)       Assessment & Plan:  1. Facial pain - unclear etiology, no evidence of acute bacterial process - may be early shingles partially treated with several doses acyclovir  2. Herpes zoster without complication - Provided written and verbal information regarding diagnosis and treatment. - RTC precautions reviewed and emphasized importance of immediately seeking treatment if any lesions occur around her eye -  valACYclovir (VALTREX) 1000 MG tablet; Take 1 tablet (1,000 mg total) by mouth every 8 (eight) hours.  Dispense: 21 tablet; Refill: 0  3. Measles, mumps, rubella (MMR) vaccination status unknown - Measles/Mumps/Rubella Immunity   Clarene Reamer, FNP-BC  [facility]  12/30/2017 6:58 AM

## 2017-12-30 ENCOUNTER — Encounter: Payer: Self-pay | Admitting: Family Medicine

## 2017-12-30 LAB — MEASLES/MUMPS/RUBELLA IMMUNITY
Mumps IgG: <9 AU/mL — ABNORMAL LOW
RUBELLA: 3.29 {index}
RUBEOLA IGG: 46.1 [AU]/ml

## 2017-12-31 NOTE — Addendum Note (Signed)
Addended by: Clarene Reamer B on: 12/31/2017 11:15 AM   Modules accepted: Orders

## 2018-01-06 ENCOUNTER — Encounter: Payer: Self-pay | Admitting: Family Medicine

## 2018-01-09 ENCOUNTER — Other Ambulatory Visit: Payer: Self-pay | Admitting: Family Medicine

## 2018-01-09 DIAGNOSIS — F419 Anxiety disorder, unspecified: Secondary | ICD-10-CM

## 2018-01-09 NOTE — Telephone Encounter (Signed)
Last filled 12/12/17 # 90 refills 0  Last OV 12/29/17

## 2018-01-15 ENCOUNTER — Encounter: Payer: Self-pay | Admitting: Family Medicine

## 2018-01-15 DIAGNOSIS — R635 Abnormal weight gain: Secondary | ICD-10-CM | POA: Diagnosis not present

## 2018-01-15 DIAGNOSIS — Z6836 Body mass index (BMI) 36.0-36.9, adult: Secondary | ICD-10-CM | POA: Diagnosis not present

## 2018-01-16 ENCOUNTER — Encounter: Payer: Self-pay | Admitting: Family Medicine

## 2018-01-16 ENCOUNTER — Ambulatory Visit: Payer: 59

## 2018-01-16 ENCOUNTER — Ambulatory Visit
Admission: RE | Admit: 2018-01-16 | Discharge: 2018-01-16 | Disposition: A | Discharge status: Home / Self Care | Payer: 59 | Source: Ambulatory Visit | Attending: Family Medicine | Admitting: Family Medicine

## 2018-01-16 DIAGNOSIS — L4050 Arthropathic psoriasis, unspecified: Secondary | ICD-10-CM | POA: Diagnosis not present

## 2018-01-16 DIAGNOSIS — Z1231 Encounter for screening mammogram for malignant neoplasm of breast: Secondary | ICD-10-CM

## 2018-01-26 ENCOUNTER — Encounter: Payer: Self-pay | Admitting: Family Medicine

## 2018-01-27 ENCOUNTER — Encounter: Payer: Self-pay | Admitting: Family Medicine

## 2018-01-27 ENCOUNTER — Ambulatory Visit (INDEPENDENT_AMBULATORY_CARE_PROVIDER_SITE_OTHER): Payer: 59 | Admitting: Family Medicine

## 2018-01-27 VITALS — BP 126/92 | HR 82 | Temp 97.9°F | Ht 64.0 in | Wt 214.5 lb

## 2018-01-27 DIAGNOSIS — R05 Cough: Secondary | ICD-10-CM | POA: Diagnosis not present

## 2018-01-27 DIAGNOSIS — L089 Local infection of the skin and subcutaneous tissue, unspecified: Secondary | ICD-10-CM | POA: Diagnosis not present

## 2018-01-27 DIAGNOSIS — R058 Other specified cough: Secondary | ICD-10-CM | POA: Insufficient documentation

## 2018-01-27 MED ORDER — CLINDAMYCIN HCL 300 MG PO CAPS: 300.0000 mg | ORAL_CAPSULE | Freq: Three times a day (TID) | ORAL | 0 refills | Status: DC

## 2018-01-27 NOTE — Progress Notes (Signed)
Subjective:    Patient ID: Melinda Porter, female    DOB: 12/07/1970, 47 y.o.   MRN: [id number]  HPI 47 yo pt of NP Carlean Purl here with a possible skin infection of L breast   Spot on L breast  Looks like a cyst   Was told at her mammogram by a tech that she could "lance" it She did poke at it Friday/squeezed  Got "hard white stuff"   Spot is sore  Cleaning with alcohol wipes A little bloody drainage  Is red   Started feeling bad sat night  Stayed in bed - since then  Feverish/headache /nauseated   Has a cough-is not new (2 mo and prod of green sputum)   Temp: 97.9 F (36.6 C)    Patient Active Problem List   Diagnosis Date Noted  . Skin infection 01/27/2018  . Colitis 01/05/2017  . Lower GI bleed 01/05/2017  . Headache 11/05/2014  . Persistent headaches 11/05/2014  . Facial droop 11/05/2014  . DM (diabetes mellitus) ([place]) 11/05/2014  . Hypokalemia 11/05/2014  . Syncope 11/05/2014  . Hypertension 11/05/2014  . Hyperlipemia 11/05/2014  . Psoriatic arthritis ([place]) 11/05/2014  . Hypoglycemia 04/13/2014  . Abdominal pain, epigastric 03/08/2014  . Belching 03/08/2014  . Nausea without vomiting 03/08/2014   Past Medical History:  Diagnosis Date  . Anxiety   . Arthritis   . Depression   . Endometriosis   . Hiatal hernia   . Hyperlipemia   . Hypertension   . Lupus ([place])   . PTSD (post-traumatic stress disorder)    Past Surgical History:  Procedure Laterality Date  . CESAREAN SECTION  1997  . FLEXIBLE SIGMOIDOSCOPY N/A 01/06/2017   Procedure: FLEXIBLE SIGMOIDOSCOPY;  Surgeon: Yetta Flock, MD;  Location: [facility];  Service: Gastroenterology;  Laterality: N/A;  . OOPHORECTOMY Bilateral   . PARTIAL HYSTERECTOMY  07/2009  . TEMPOROMANDIBULAR JOINT SURGERY  2009   right side  . TONSILLECTOMY  2009   Social History   Tobacco Use  . Smoking status: Former Smoker    Types: Cigarettes    Last attempt to quit: 03/09/1999    Years since quitting:  18.9  . Smokeless tobacco: Never Used  Substance Use Topics  . Alcohol use: No    Alcohol/week: 0.0 standard drinks  . Drug use: No   Family History  Problem Relation Age of Onset  . Colon polyps Maternal Grandmother   . Lung cancer Maternal Grandmother   . Liver disease Father        agent orange  . Diabetes Maternal Grandfather   . Heart disease Maternal Grandfather   . Hypertension Maternal Grandfather   . Colon cancer Maternal Uncle        great uncle  . Breast cancer Neg Hx    Allergies  Allergen Reactions  . Lyrica [Pregabalin] Shortness Of Breath  . Sulfa Antibiotics Rash  . Ciprofloxacin Nausea Only  . Gabapentin     Stroke like symptoms  . Nsaids Other (See Comments)    Liver problems- can't take tylenol, ibuprofen etc.  . Tolmetin     Other reaction(s): Other (See Comments) Liver problems- can't take tylenol, ibuprofen etc.  . Codeine Rash  . Humira [Adalimumab] Rash    Worsening of Lupus rash.  . Morphine And Related Rash    "Wired up"  . Penicillins Rash    Has patient had a PCN reaction causing immediate rash, facial/tongue/throat swelling, SOB or lightheadedness with hypotension: NO Has  patient had a PCN reaction causing severe rash involving mucus membranes or skin necrosis: NO Has patient had a PCN reaction that required hospitalization NO Has patient had a PCN reaction occurring within the last 10 years: NO If all of the above answers are "NO", then may proceed with Cephalosporin use.   Current Outpatient Medications on File Prior to Visit  Medication Sig Dispense Refill  . abatacept (ORENCIA) 250 MG injection Inject into the vein.    . benzonatate (TESSALON) 100 MG capsule Take 1-2 capsules (100-200 mg total) by mouth 3 (three) times daily as needed for cough. 40 capsule 0  . clonazePAM (KLONOPIN) 1 MG tablet TAKE 1 TABLET BY MOUTH 3 (THREE) TIMES DAILY AS NEEDED. FOR ANXIETY 90 tablet 0  . cyclobenzaprine (FLEXERIL) 10 MG tablet Take 1 tablet (10 mg  total) by mouth 3 (three) times daily as needed for muscle spasms. 60 tablet 1  . Diethylpropion HCl CR 75 MG TB24 Take 1 tablet by mouth every morning.  0  . diphenoxylate-atropine (LOMOTIL) 2.5-0.025 MG tablet Take 1 tablet by mouth 3 (three) times daily as needed for diarrhea or loose stools. 60 tablet 2  . esomeprazole (NEXIUM) 40 MG capsule Take 1 capsule (40 mg total) by mouth daily. 90 capsule 3  . hydroxychloroquine (PLAQUENIL) 200 MG tablet Take 200 mg by mouth 2 (two) times daily.    [name] ibuprofen (ADVIL,MOTRIN) 600 MG tablet Take 1 tablet (600 mg total) by mouth every 6 (six) hours as needed. 20 tablet 0  . metoCLOPramide (REGLAN) 10 MG tablet Take one tablet 20 minutes before drinking each prep 2 tablet 0  . nystatin (MYCOSTATIN/NYSTOP) 100000 UNIT/GM POWD For use as recommended per package insert to affected areas (under breast) 15 g 0  . ondansetron (ZOFRAN ODT) 4 MG disintegrating tablet Take 1 tablet (4 mg total) by mouth every 8 (eight) hours as needed for nausea or vomiting. 20 tablet 0  . sertraline (ZOLOFT) 100 MG tablet TAKE 1.5 TABLETS (150 MG TOTAL) DAILY BY MOUTH. (Patient taking differently: Take 200 mg by mouth daily. ) 135 tablet 1  . topiramate (TOPAMAX) 100 MG tablet Take 1 tablet (100 mg total) by mouth 2 (two) times daily. 180 tablet 0  . valACYclovir (VALTREX) 1000 MG tablet Take 1 tablet (1,000 mg total) by mouth every 8 (eight) hours. 21 tablet 0  . VITAMIN D HIGH POTENCY 1000 units capsule TAKE 1 CAPSULE (1,000 UNITS TOTAL) BY MOUTH DAILY. 90 capsule 3   Current Facility-Administered Medications on File Prior to Visit  Medication Dose Route Frequency Provider Last Rate Last Dose  . 0.9 %  sodium chloride infusion  500 mL Intravenous Once Irene Shipper, MD        Review of Systems  Constitutional: Positive for fatigue. Negative for activity change, appetite change, fever and unexpected weight change.       Body aches and malaise  HENT: Negative for congestion, ear  pain, rhinorrhea, sinus pressure and sore throat.   Eyes: Negative for pain, redness and visual disturbance.  Respiratory: Positive for cough. Negative for shortness of breath and wheezing.        Persistent prod cough- green phlegm   Cardiovascular: Negative for chest pain and palpitations.  Gastrointestinal: Positive for nausea. Negative for abdominal pain, blood in stool, constipation, diarrhea and vomiting.  Endocrine: Negative for polydipsia and polyuria.  Genitourinary: Negative for dysuria, frequency and urgency.       No breast lumps  Musculoskeletal: Negative for  arthralgias, back pain and myalgias.  Skin: Positive for wound. Negative for pallor and rash.  Allergic/Immunologic: Negative for environmental allergies.  Neurological: Positive for headaches. Negative for dizziness and syncope.  Hematological: Negative for adenopathy. Does not bruise/bleed easily.  Psychiatric/Behavioral: Negative for decreased concentration and dysphoric mood. The patient is not nervous/anxious.        Objective:   Physical Exam  Constitutional: She appears well-developed and well-nourished. No distress.  obese and well appearing   HENT:  Head: Normocephalic and atraumatic.  Mouth/Throat: Oropharynx is clear and moist.  Eyes: Pupils are equal, round, and reactive to light. Conjunctivae and EOM are normal. Right eye exhibits no discharge. Left eye exhibits no discharge. No scleral icterus.  Neck: Normal range of motion. Neck supple.  Cardiovascular: Normal rate, regular rhythm and normal heart sounds.  Pulmonary/Chest: Effort normal and breath sounds normal. No stridor. No respiratory distress. She has no wheezes. She has no rales. She exhibits no tenderness.  Good air exch No rales or rhonchi  Abdominal: Soft. Bowel sounds are normal. She exhibits no distension and no mass. There is no tenderness. There is guarding.  Musculoskeletal: She exhibits no edema.  Lymphadenopathy:    She has no  cervical adenopathy.  Neurological: She is alert. Coordination normal.  Skin: Skin is warm and dry.  1.5 cm area of previous cyst -no scabbed on L inner breast No drainage Scant collar of erythema  Tender but not fluctuant   Psychiatric: She has a normal mood and affect.  Seems very stressed          Assessment & Plan:   Problem List Items Addressed This Visit      Musculoskeletal and Integument   Skin infection - Primary    Mildly infected/resolving small seb cyst on L breast  inst to keep clean  Do not express or manipulate Warm compress ok  abx oint/lightly cover  Px clindamycin (she tolerates this) with warning to watch for diarrhea 300 mg tid  Update if not starting to improve in a week or if worsening   Watch for inc redness or streaking  If cyst returns/may need removal       Relevant Medications   clindamycin (CLEOCIN) 300 MG capsule     Other   Cough productive of purulent sputum    Pt states she has prod cough/ colored sputum for 2 mo  ? Etiology  Px clindamycin for wound -this may help if she has respiratory infection as well  inst to follow up if no imp  Reassuring exam today

## 2018-01-27 NOTE — Assessment & Plan Note (Signed)
Mildly infected/resolving small seb cyst on L breast  inst to keep clean  Do not express or manipulate Warm compress ok  abx oint/lightly cover  Px clindamycin (she tolerates this) with warning to watch for diarrhea 300 mg tid  Update if not starting to improve in a week or if worsening   Watch for inc redness or streaking  If cyst returns/may need removal

## 2018-01-27 NOTE — Patient Instructions (Addendum)
Take the clindamycin 300 mg three times daily with food for 7 days for skin infection   You may have an infected sebaceous cyst   Keep the spot clean (soap and water) twice daily  Any antibiotic ointment over the counter is fine  Avoid adhesive- tuck a bit of guaze in your bra instead of band aids   Update if not starting to improve in a week or if worsening   Also the cough

## 2018-01-27 NOTE — Assessment & Plan Note (Signed)
Pt states she has prod cough/ colored sputum for 2 mo  ? Etiology  Px clindamycin for wound -this may help if she has respiratory infection as well  inst to follow up if no imp  Reassuring exam today

## 2018-01-28 ENCOUNTER — Encounter: Payer: Self-pay | Admitting: Family Medicine

## 2018-02-03 ENCOUNTER — Encounter: Payer: Self-pay | Admitting: Family Medicine

## 2018-02-04 ENCOUNTER — Encounter: Payer: Self-pay | Admitting: Family Medicine

## 2018-02-06 ENCOUNTER — Other Ambulatory Visit: Payer: Self-pay | Admitting: Family Medicine

## 2018-02-06 DIAGNOSIS — F419 Anxiety disorder, unspecified: Secondary | ICD-10-CM

## 2018-02-06 NOTE — Telephone Encounter (Signed)
Last filled 01/09/18 #90 refills 0  Last OV 01/27/18.

## 2018-02-13 DIAGNOSIS — L4050 Arthropathic psoriasis, unspecified: Secondary | ICD-10-CM | POA: Diagnosis not present

## 2018-02-13 LAB — SEDIMENTATION RATE: SED RATE BY MODIFIED WESTERGREN,MANUAL: 13

## 2018-02-13 LAB — QUANTIFERON-TB GOLD PLUS: QUANTIFERON TB GOLD: NEGATIVE

## 2018-02-28 ENCOUNTER — Other Ambulatory Visit: Payer: Self-pay | Admitting: Family Medicine

## 2018-02-28 DIAGNOSIS — F419 Anxiety disorder, unspecified: Secondary | ICD-10-CM

## 2018-03-02 NOTE — Telephone Encounter (Signed)
Last filled 02/06/18 #90 refills 0 Last OV 12/29/17

## 2018-03-02 NOTE — Telephone Encounter (Signed)
Electronic refill request Topamax Last office visit 01/27/18 acute Last refill 12/09/17 #180

## 2018-03-04 ENCOUNTER — Encounter: Payer: Self-pay | Admitting: Family Medicine

## 2018-03-04 ENCOUNTER — Telehealth: Payer: Self-pay | Admitting: Family Medicine

## 2018-03-04 NOTE — Progress Notes (Signed)
OOW note due to illness.

## 2018-03-04 NOTE — Telephone Encounter (Signed)
fmla paperwork in [employer] in box  Please see questions 5 through 6 and 11

## 2018-03-06 DIAGNOSIS — M797 Fibromyalgia: Secondary | ICD-10-CM | POA: Diagnosis not present

## 2018-03-06 DIAGNOSIS — L4050 Arthropathic psoriasis, unspecified: Secondary | ICD-10-CM | POA: Diagnosis not present

## 2018-03-06 DIAGNOSIS — R768 Other specified abnormal immunological findings in serum: Secondary | ICD-10-CM | POA: Diagnosis not present

## 2018-03-06 NOTE — Telephone Encounter (Signed)
Pt called to give the correct Fax # [phone].

## 2018-03-06 NOTE — Telephone Encounter (Signed)
Forms completed and returned.

## 2018-03-06 NOTE — Telephone Encounter (Signed)
Pt called back to give me fax # [phone]  she stated her employee ID# [id number] needed to be on all pages  Pt aware paperwork has been faxed Copy for scan Copy for pt

## 2018-03-06 NOTE — Telephone Encounter (Signed)
refaxed to correct #

## 2018-03-06 NOTE — Telephone Encounter (Signed)
Pt aware paperwork is ready she will call me back to give me fax number

## 2018-03-15 ENCOUNTER — Other Ambulatory Visit: Payer: Self-pay | Admitting: Family Medicine

## 2018-03-18 DIAGNOSIS — L4050 Arthropathic psoriasis, unspecified: Secondary | ICD-10-CM | POA: Diagnosis not present

## 2018-03-19 NOTE — Telephone Encounter (Signed)
Please fax to # [phone] Leave ID # [id number]

## 2018-03-19 NOTE — Telephone Encounter (Signed)
refaxed paperwork pt aware

## 2018-04-10 DIAGNOSIS — L821 Other seborrheic keratosis: Secondary | ICD-10-CM | POA: Diagnosis not present

## 2018-04-10 DIAGNOSIS — D2239 Melanocytic nevi of other parts of face: Secondary | ICD-10-CM | POA: Diagnosis not present

## 2018-04-10 DIAGNOSIS — L400 Psoriasis vulgaris: Secondary | ICD-10-CM | POA: Diagnosis not present

## 2018-04-10 DIAGNOSIS — D485 Neoplasm of uncertain behavior of skin: Secondary | ICD-10-CM | POA: Diagnosis not present

## 2018-04-17 DIAGNOSIS — L4050 Arthropathic psoriasis, unspecified: Secondary | ICD-10-CM | POA: Diagnosis not present

## 2018-05-02 ENCOUNTER — Other Ambulatory Visit: Payer: Self-pay | Admitting: Family Medicine

## 2018-05-02 DIAGNOSIS — F419 Anxiety disorder, unspecified: Secondary | ICD-10-CM

## 2018-05-04 NOTE — Telephone Encounter (Signed)
Electronic refill request Clonazepam Last office visit 01/27/18/acute Last refill 03/02/18 #90/1

## 2018-05-15 DIAGNOSIS — L4050 Arthropathic psoriasis, unspecified: Secondary | ICD-10-CM | POA: Diagnosis not present

## 2018-05-31 ENCOUNTER — Encounter: Payer: Self-pay | Admitting: Family Medicine

## 2018-06-01 ENCOUNTER — Other Ambulatory Visit: Payer: Self-pay | Admitting: Family Medicine

## 2018-06-01 DIAGNOSIS — F419 Anxiety disorder, unspecified: Secondary | ICD-10-CM

## 2018-06-01 MED ORDER — CLONAZEPAM 1 MG PO TABS: 1.0000 mg | ORAL_TABLET | Freq: Three times a day (TID) | ORAL | 0 refills | Status: DC | PRN

## 2018-06-15 DIAGNOSIS — L4050 Arthropathic psoriasis, unspecified: Secondary | ICD-10-CM | POA: Diagnosis not present

## 2018-06-15 LAB — CBC AND DIFFERENTIAL
HCT: 39 (ref 36–46)
Hemoglobin: 12.4 (ref 12.0–16.0)
Platelets: 167 (ref 150–399)
WBC: 8.7

## 2018-06-15 LAB — BASIC METABOLIC PANEL
BUN: 15 (ref 4–21)
Creatinine: 0.7 (ref 0.5–1.1)
Glucose: 92
Potassium: 4.1 (ref 3.4–5.3)
Sodium: 144 (ref 137–147)

## 2018-06-17 DIAGNOSIS — Z6838 Body mass index (BMI) 38.0-38.9, adult: Secondary | ICD-10-CM | POA: Diagnosis not present

## 2018-06-17 DIAGNOSIS — R635 Abnormal weight gain: Secondary | ICD-10-CM | POA: Diagnosis not present

## 2018-06-26 ENCOUNTER — Encounter: Payer: Self-pay | Admitting: Family Medicine

## 2018-07-15 DIAGNOSIS — L4050 Arthropathic psoriasis, unspecified: Secondary | ICD-10-CM | POA: Diagnosis not present

## 2018-07-19 ENCOUNTER — Other Ambulatory Visit: Payer: Self-pay | Admitting: Family Medicine

## 2018-07-21 ENCOUNTER — Telehealth: Payer: Self-pay | Admitting: *Deleted

## 2018-07-21 NOTE — Telephone Encounter (Signed)
Received fax from [facility] [place] stating esomeprazole 40 mg is not covered.  Please send alternative.  No alternatives listed.

## 2018-07-22 ENCOUNTER — Encounter: Payer: Self-pay | Admitting: Family Medicine

## 2018-07-22 NOTE — Telephone Encounter (Signed)
Sent patient a mychart message to determine if there is anything else that works for her GERD.

## 2018-07-23 ENCOUNTER — Other Ambulatory Visit: Payer: Self-pay | Admitting: Family Medicine

## 2018-07-23 ENCOUNTER — Encounter: Payer: Self-pay | Admitting: Family Medicine

## 2018-07-23 ENCOUNTER — Telehealth: Payer: Self-pay

## 2018-07-23 MED ORDER — PANTOPRAZOLE SODIUM 40 MG PO TBEC: 40.0000 mg | DELAYED_RELEASE_TABLET | Freq: Every day | ORAL | 1 refills | Status: DC

## 2018-07-23 NOTE — Telephone Encounter (Signed)
New prescription sent to patient's pharmacy and Mychart message sent to patient.

## 2018-07-23 NOTE — Telephone Encounter (Signed)
Inbound call to triage regarding patient's GERD medication.

## 2018-07-23 NOTE — Telephone Encounter (Signed)
Contacted patient to address the following questions sent by PCP to patient's MyChart:  The list is helpful. Have you tried any of these before? Anything work for you in the past?  [place]   Per patient, she has not tried any of the medications listed. Per patient, she has only tried Pepcid OTC and it was not effective.

## 2018-07-31 ENCOUNTER — Encounter: Payer: Self-pay | Admitting: Family Medicine

## 2018-08-03 ENCOUNTER — Other Ambulatory Visit: Payer: Self-pay

## 2018-08-03 ENCOUNTER — Encounter: Payer: Self-pay | Admitting: Family Medicine

## 2018-08-03 ENCOUNTER — Ambulatory Visit (INDEPENDENT_AMBULATORY_CARE_PROVIDER_SITE_OTHER): Payer: 59 | Admitting: Family Medicine

## 2018-08-03 VITALS — BP 135/87 | HR 87 | Temp 99.0°F | Ht 64.0 in | Wt 231.5 lb

## 2018-08-03 DIAGNOSIS — Z6841 Body Mass Index (BMI) 40.0 and over, adult: Secondary | ICD-10-CM | POA: Diagnosis not present

## 2018-08-03 DIAGNOSIS — I10 Essential (primary) hypertension: Secondary | ICD-10-CM | POA: Diagnosis not present

## 2018-08-03 DIAGNOSIS — R079 Chest pain, unspecified: Secondary | ICD-10-CM

## 2018-08-03 DIAGNOSIS — R5383 Other fatigue: Secondary | ICD-10-CM

## 2018-08-03 DIAGNOSIS — R42 Dizziness and giddiness: Secondary | ICD-10-CM

## 2018-08-03 DIAGNOSIS — R200 Anesthesia of skin: Secondary | ICD-10-CM | POA: Diagnosis not present

## 2018-08-03 DIAGNOSIS — R202 Paresthesia of skin: Secondary | ICD-10-CM

## 2018-08-03 DIAGNOSIS — L4050 Arthropathic psoriasis, unspecified: Secondary | ICD-10-CM

## 2018-08-03 DIAGNOSIS — R739 Hyperglycemia, unspecified: Secondary | ICD-10-CM | POA: Diagnosis not present

## 2018-08-03 LAB — HEMOGLOBIN A1C: Hgb A1c MFr Bld: 5.7 % (ref 4.6–6.5)

## 2018-08-03 LAB — LIPID PANEL
Cholesterol: 203 mg/dL — ABNORMAL HIGH (ref 0–200)
HDL: 39.5 mg/dL (ref >39.00)
NonHDL: 163.49
Total CHOL/HDL Ratio: 5
Triglycerides: 258 mg/dL — ABNORMAL HIGH (ref 0.0–149.0)
VLDL: 51.6 mg/dL — ABNORMAL HIGH (ref 0.0–40.0)

## 2018-08-03 LAB — VITAMIN B12: Vitamin B-12: 852 pg/mL (ref 211–911)

## 2018-08-03 LAB — LDL CHOLESTEROL, DIRECT: Direct LDL: 128 mg/dL

## 2018-08-03 LAB — CK: Total CK: 44 U/L (ref 7–177)

## 2018-08-03 LAB — HIGH SENSITIVITY CRP: CRP, High Sensitivity: 13.29 mg/L — ABNORMAL HIGH (ref 0.000–5.000)

## 2018-08-03 LAB — FERRITIN: Ferritin: 12.8 ng/mL (ref 10.0–291.0)

## 2018-08-03 LAB — SEDIMENTATION RATE: Sed Rate: 14 mm/hr (ref 0–20)

## 2018-08-03 LAB — VITAMIN D 25 HYDROXY (VIT D DEFICIENCY, FRACTURES): VITD: 28.28 ng/mL — ABNORMAL LOW (ref 30.00–100.00)

## 2018-08-03 NOTE — Patient Instructions (Signed)
Good to see you today  Let's try the following to see if your symptoms improve-  Increase hydration, can try hydration booster like Liquid IV powders to add to water. Add a little more table salt to your food.   Increase activity- while waiting on calls, walk in place, do squats, dance. On your lunch break, walk first. Start with 5-7 minutes, increase gradually to 20 minutes.   Try to slowly decrease your clonazepam- take 1.5 tablets at bedtime for 2 weeks, if tolerated, decrease to 1 tablet.   I will let you know about lab results.

## 2018-08-03 NOTE — Progress Notes (Signed)
Subjective:    Patient ID: Melinda Porter, female    DOB: 1971/02/06, 48 y.o.   MRN: [id number]  HPI This is a 48 yo female who presents today with several concerns.  She has been having increasing fatigue, chest pain and anxiety/depression. Has had increased symptoms over last 1 month.   Chest pain- intermittent, left side into arm. Occurs 1-2 times per week, arm feels achy, chest hurts like it has been hit. Lasts for several hours. No known triggers. Never awakens her from sleep.   Orthostatic changes- feels like everything is going black when she stands up, heart racing. OK water intake. 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 Has a hard time getting going in the morning. No caffeine. Discussed possibility of POTS with Dr. Amil Amen. Had labs done 06/15/18- CBC with diff showed mildly decreased MCV, MCH, mildly elevated RDW, nml HGB/HCT, WBC, CMET with low CO2, (similar over 1 year), nml electrolytes, LFTs, renal function.   Stomach doing ok.   Fatigue- when she is working, works 16 hours a day. 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 Feels fatigued today.   Mood- feeling both agitated/wound up and down/low.  She currently takes sertraline 150 mg p.o. nightly and clonazepam 1 mg in the afternoon and 2 mg before bedtime.  She has been on clonazepam for many years.  Past Medical History:  Diagnosis Date  . Anxiety   . Arthritis   . Depression   . Endometriosis   . Hiatal hernia   . Hyperlipemia   . Hypertension   . Lupus ([place])   . PTSD (post-traumatic stress disorder)    Past Surgical History:  Procedure Laterality Date  . CESAREAN SECTION  1997  . FLEXIBLE SIGMOIDOSCOPY N/A 01/06/2017   Procedure: FLEXIBLE SIGMOIDOSCOPY;  Surgeon: Yetta Flock, MD;  Location: [facility];  Service: Gastroenterology;  Laterality: N/A;  . OOPHORECTOMY Bilateral   . PARTIAL HYSTERECTOMY  07/2009  .  TEMPOROMANDIBULAR JOINT SURGERY  2009   right side  . TONSILLECTOMY  2009   Family History  Problem Relation Age of Onset  . Colon polyps Maternal Grandmother   . Lung cancer Maternal Grandmother   . Liver disease Father        agent orange  . Diabetes Maternal Grandfather   . Heart disease Maternal Grandfather   . Hypertension Maternal Grandfather   . Colon cancer Maternal Uncle        great uncle  . Breast cancer Neg Hx    Social History   Tobacco Use  . Smoking status: Former Smoker    Types: Cigarettes    Last attempt to quit: 03/09/1999    Years since quitting: 19.4  . Smokeless tobacco: Never Used  Substance Use Topics  . Alcohol use: No    Alcohol/week: 0.0 standard drinks  . Drug use: No      Review of Systems  per HPI    Objective:   Physical Exam Constitutional:      General: She is not in acute distress.    Appearance: She is well-developed. She is obese. She is not ill-appearing, toxic-appearing or diaphoretic.  HENT:     Head: Normocephalic and atraumatic.  Cardiovascular:     Rate and Rhythm: Normal rate and regular rhythm.     Heart sounds: Normal heart sounds.  Pulmonary:     Effort: Pulmonary effort is normal.  Breath sounds: Normal breath sounds.  Chest:    Skin:    General: Skin is warm and dry.  Neurological:     Mental Status: She is alert and oriented to person, place, and time.  Psychiatric:        Mood and Affect: Mood is depressed.        Speech: Speech normal.        Behavior: Behavior normal.        Thought Content: Thought content normal.        Judgment: Judgment normal.       BP 135/87   Pulse 87   Temp 99 F (37.2 C) (Oral)   Ht 5' 4"  (1.626 m)   Wt 231 lb 8 oz (105 kg)   LMP 06/23/2009   SpO2 97%   BMI 39.74 kg/m  Wt Readings from Last 3 Encounters:  08/03/18 231 lb 8 oz (105 kg)  01/27/18 214 lb 8 oz (97.3 kg)  12/29/17 217 lb (98.4 kg)   EKG- SR, rate 81, PR 180, compared to previous tracing, no  change  Orthostatic VS for the past 24 hrs:  BP- Lying Pulse- Lying BP- Sitting Pulse- Sitting BP- Standing at 0 minutes Pulse- Standing at 0 minutes  08/03/18 0936 133/75 79 (!) 149/95 84 129/82 92        Assessment & Plan:  1. Chest pain, unspecified type -Occurs infrequently, does not seem to be related to activity or exertion.  EKG unremarkable today - EKG 12-Lead - Comprehensive metabolic panel; Future  2. Positional lightheadedness -Patient with concern for pots and reviewed symptoms with her, do not think this is likely due to lack of orthostatic tachycardia -Reviewed her medications with her and discussed contributing factors such as poor fluid intake, deconditioning -Provided verbal and written instructions regarding increasing fluids, using oral rehydration solution, adding a little extra salt to her diet and significantly increasing her movement/exercise. -Discussed medications that may be contributing and discussed working to wean her clonazepam.  She is a little uncertain about this but agreed to try to decrease her nighttime dose to 1-1/2 tablets nightly for the next 2 weeks and then to decrease further to 1 tablet nightly if tolerated.  3. Elevated blood sugar - Lipid Panel - Hemoglobin A1c - Comprehensive metabolic panel; Future  4. Essential hypertension - unremarkable, up a little with sitting from lying, continue to monitor - Lipid Panel - Hemoglobin A1c - Comprehensive metabolic panel; Future  5. Psoriatic arthritis ([place]) -Continue follow-up with rheumatology - CK - Sedimentation rate - High sensitivity CRP  6. BMI 40.0-44.9, adult ([place]) -Significant recent weight gain, this is likely multifactorial - Vitamin D, 25-hydroxy - Lipid Panel - Hemoglobin A1c  7. Other fatigue -Again, likely multifactorial.  Discussed the impact that her medications could be having on her energy level especially clonazepam and topiramate  -We will determine follow-up based  on lab results - Ferritin - CK - Sedimentation rate - High sensitivity CRP - Vitamin D, 25-hydroxy - Comprehensive metabolic panel; Future  8. Numbness and tingling in both hands - Vitamin B12 - Comprehensive metabolic panel; Future   Clarene Reamer, FNP-BC  [facility]  08/03/2018 12:52 PM

## 2018-08-04 LAB — COMPREHENSIVE METABOLIC PANEL
ALT: 10 U/L (ref 0–35)
AST: 11 U/L (ref 0–37)
Albumin: 4.2 g/dL (ref 3.5–5.2)
Alkaline Phosphatase: 77 U/L (ref 39–117)
BUN: 12 mg/dL (ref 6–23)
CO2: 22 mEq/L (ref 19–32)
Calcium: 9.1 mg/dL (ref 8.4–10.5)
Chloride: 110 mEq/L (ref 96–112)
Creatinine, Ser: 0.77 mg/dL (ref 0.40–1.20)
GFR: 80.68 mL/min (ref >60.00)
Glucose, Bld: 108 mg/dL — ABNORMAL HIGH (ref 70–99)
Potassium: 3.9 mEq/L (ref 3.5–5.1)
Sodium: 143 mEq/L (ref 135–145)
Total Bilirubin: 0.2 mg/dL (ref 0.2–1.2)
Total Protein: 6.4 g/dL (ref 6.0–8.3)

## 2018-08-04 NOTE — Addendum Note (Signed)
Addended by: Ellamae Sia on: 08/04/2018 07:53 AM   Modules accepted: Orders

## 2018-08-12 DIAGNOSIS — L4050 Arthropathic psoriasis, unspecified: Secondary | ICD-10-CM | POA: Diagnosis not present

## 2018-08-18 ENCOUNTER — Encounter: Payer: Self-pay | Admitting: Family Medicine

## 2018-08-19 ENCOUNTER — Encounter: Payer: Self-pay | Admitting: Family Medicine

## 2018-08-19 ENCOUNTER — Other Ambulatory Visit: Payer: Self-pay | Admitting: Family Medicine

## 2018-08-20 DIAGNOSIS — L4050 Arthropathic psoriasis, unspecified: Secondary | ICD-10-CM | POA: Diagnosis not present

## 2018-08-20 DIAGNOSIS — M797 Fibromyalgia: Secondary | ICD-10-CM | POA: Diagnosis not present

## 2018-08-20 DIAGNOSIS — R768 Other specified abnormal immunological findings in serum: Secondary | ICD-10-CM | POA: Diagnosis not present

## 2018-08-20 NOTE — Telephone Encounter (Signed)
Refill request for Topamax and Sertraline. LOV 08/03/2018 acute-several issues. No future appointments.  Topamax last filled on 03/02/2018 for 180 tablets with 1 refill

## 2018-08-24 ENCOUNTER — Encounter: Payer: Self-pay | Admitting: Family Medicine

## 2018-08-28 ENCOUNTER — Other Ambulatory Visit: Payer: Self-pay | Admitting: Family Medicine

## 2018-08-28 DIAGNOSIS — F419 Anxiety disorder, unspecified: Secondary | ICD-10-CM

## 2018-08-28 MED ORDER — CLONAZEPAM 1 MG PO TABS: 1.0000 mg | ORAL_TABLET | Freq: Three times a day (TID) | ORAL | 0 refills | Status: DC | PRN

## 2018-08-28 NOTE — Telephone Encounter (Signed)
Refill sent to patients pharmacy. 

## 2018-08-28 NOTE — Telephone Encounter (Signed)
Spoke with patient and advised that pharmacy has RX on file. Patient was worried that something would come up over the weekend. I called pharmacist again to see if they can try and fill it while I am on the phone. Found out that RX on file is from January and not for 90 day supply. Need new RX for Clonazepam for 90 day supply/3 months supply. Last filled on 06/01/2018 for 90 days with no additional refills. Please review today because patient will be out of this medication this weekend.

## 2018-08-28 NOTE — Addendum Note (Signed)
Addended by: Kris Mouton on: 08/28/2018 02:59 PM   Modules accepted: Orders

## 2018-08-28 NOTE — Telephone Encounter (Signed)
Pt called stating her rx clonzaepam has to be for 3 months not 90 days  Best number [phone]  Pt will be out of her meds by Sunday 5/17  [facility] [address] [place]

## 2018-08-28 NOTE — Telephone Encounter (Signed)
Last fill 06/01/2018 for #270 (90 day supply). LOV 08/03/2018. No future appointments. Please see note below. 3 months is a 90 day supply unless I am missing something. Please review.

## 2018-08-28 NOTE — Telephone Encounter (Addendum)
Noted, will need to speak with PCP.  Based off of last PCP note she was advised to start weaning down by taking 1.5 tablets HS. #270 tablets seems like a lot of Clonazepam.

## 2018-08-28 NOTE — Telephone Encounter (Signed)
Called and spoke to pharmacy staff who stated that they had the prescription and it was ready to be filled.

## 2018-08-28 NOTE — Telephone Encounter (Signed)
Per Shirlean Mylar patient said RX had to say 3 months supply not 90 days

## 2018-09-03 DIAGNOSIS — L400 Psoriasis vulgaris: Secondary | ICD-10-CM | POA: Diagnosis not present

## 2018-09-09 DIAGNOSIS — L4050 Arthropathic psoriasis, unspecified: Secondary | ICD-10-CM | POA: Diagnosis not present

## 2018-09-10 DIAGNOSIS — R635 Abnormal weight gain: Secondary | ICD-10-CM | POA: Diagnosis not present

## 2018-09-10 DIAGNOSIS — Z6839 Body mass index (BMI) 39.0-39.9, adult: Secondary | ICD-10-CM | POA: Diagnosis not present

## 2018-09-10 DIAGNOSIS — E669 Obesity, unspecified: Secondary | ICD-10-CM | POA: Diagnosis not present

## 2018-10-09 ENCOUNTER — Telehealth: Payer: Self-pay

## 2018-10-09 ENCOUNTER — Encounter: Payer: Self-pay | Admitting: Family Medicine

## 2018-10-09 NOTE — Telephone Encounter (Signed)
I also sent patient mychart message asking her to call us to discuss.

## 2018-10-09 NOTE — Telephone Encounter (Signed)
Rena please triage patient.  Message from patient through mychart  I'm having a horrible headache. In my ear and teeth. Thinking it may be sinus or allergies related. I've had nasal congestion every morning. It goes away. I just need a note for work please. Idk if that will excuse it but it may help. I hope you are doing well.   Thank you,   [facility]

## 2018-10-09 NOTE — Telephone Encounter (Signed)
Left v/m requesting pt to cb;FYI to Anastasiya CMA and Rena LPN.

## 2018-10-13 NOTE — Telephone Encounter (Signed)
Left message for patient to call back to follow up on how she is feeling

## 2018-10-14 NOTE — Telephone Encounter (Signed)
Reached out to the patient multiple times with no response from the patient. Sending to provider for review/FYI

## 2018-10-15 ENCOUNTER — Encounter: Payer: Self-pay | Admitting: Family Medicine

## 2018-10-15 NOTE — Telephone Encounter (Signed)
I sent patient a mychart message checking in on her.

## 2018-11-13 ENCOUNTER — Encounter: Payer: Self-pay | Admitting: Family Medicine

## 2018-11-17 ENCOUNTER — Encounter: Payer: Self-pay | Admitting: Family Medicine

## 2018-11-18 NOTE — Telephone Encounter (Signed)
Called and spoke with patient.  She reports 5 days of swollen lymph nodes in her neck with frequent runny nose with clear nasal drainage.  She denies sore throat, cough, fever/chills.  She has felt a little achy.  Yesterday she felt a little winded but has not felt that way today.  Today she feels a little better.  She was started on prednisone taper 2 weeks ago for lupus flare by her rheumatologist, Dr. Amil Amen.  She had to stop after about 5 days due to her skin turning bright red and feeling flushed and wired.  Discussed possible etiologies of her current symptoms, she is doing a little better today we will continue to monitor over the next several days for any signs symptoms of bacterial infection otherwise we will treat for possible viral illness with over-the-counter analgesics, fluids and rest.  Follow-up instructions reviewed with patient

## 2018-11-19 ENCOUNTER — Other Ambulatory Visit: Payer: Self-pay | Admitting: Family Medicine

## 2018-11-19 ENCOUNTER — Other Ambulatory Visit: Payer: Self-pay

## 2018-11-19 DIAGNOSIS — R0989 Other specified symptoms and signs involving the circulatory and respiratory systems: Secondary | ICD-10-CM

## 2018-11-19 DIAGNOSIS — R0981 Nasal congestion: Secondary | ICD-10-CM

## 2018-11-19 DIAGNOSIS — M791 Myalgia, unspecified site: Secondary | ICD-10-CM

## 2018-11-19 DIAGNOSIS — Z20822 Contact with and (suspected) exposure to covid-19: Secondary | ICD-10-CM

## 2018-11-19 NOTE — Telephone Encounter (Signed)
Please advise. Patient is asking now if she might need to be tested for Covid?? See email from 11pm last night

## 2018-11-20 ENCOUNTER — Encounter: Payer: Self-pay | Admitting: Family Medicine

## 2018-11-20 LAB — NOVEL CORONAVIRUS, NAA: SARS-CoV-2, NAA: NOT DETECTED

## 2018-11-21 ENCOUNTER — Other Ambulatory Visit: Payer: Self-pay | Admitting: Family Medicine

## 2018-11-21 DIAGNOSIS — F419 Anxiety disorder, unspecified: Secondary | ICD-10-CM

## 2018-11-23 NOTE — Telephone Encounter (Signed)
Las refilled 08/28/2018 #270 x 0rf Last OV 08/03/2018 acute No upcoming visits scheduled.   Please advise.

## 2018-11-24 ENCOUNTER — Encounter: Payer: Self-pay | Admitting: Family Medicine

## 2018-11-24 ENCOUNTER — Other Ambulatory Visit (INDEPENDENT_AMBULATORY_CARE_PROVIDER_SITE_OTHER): Payer: 59 | Admitting: Family Medicine

## 2018-11-24 DIAGNOSIS — R300 Dysuria: Secondary | ICD-10-CM

## 2018-11-24 LAB — POCT URINALYSIS DIPSTICK
Bilirubin, UA: NEGATIVE
Blood, UA: NEGATIVE
Glucose, UA: NEGATIVE
Ketones, UA: NEGATIVE
Leukocytes, UA: NEGATIVE
Nitrite, UA: NEGATIVE
Protein, UA: NEGATIVE
Spec Grav, UA: >=1.03 — AB (ref 1.010–1.025)
Urobilinogen, UA: 0.2 E.U./dL
pH, UA: 6 (ref 5.0–8.0)

## 2018-11-24 NOTE — Telephone Encounter (Signed)
Waiting on urine to be dropped off OV will be determined once results are in

## 2018-11-24 NOTE — Addendum Note (Signed)
Addended by: Ellamae Sia on: 11/24/2018 02:52 PM   Modules accepted: Orders

## 2018-11-24 NOTE — Telephone Encounter (Signed)
Advised patient schedule a virtual visit to review U/A results and address her concerns with nausea, fatigue and runny nose.  Pt given specific times for an appt tomorrow - will await message back before scheduling.   Will send to [facility] as Juluis Rainier

## 2018-11-24 NOTE — Addendum Note (Signed)
Addended by: Virl Cagey on: 11/24/2018 02:42 PM   Modules accepted: Orders

## 2018-11-24 NOTE — Telephone Encounter (Signed)
Pt scheduled for appt with Debbie 11/25/18 at 11am DOXY Urine sent for culture to be on the safe side given her symptoms.   Will send to [facility] as FYI  Nothing further needed.

## 2018-11-24 NOTE — Telephone Encounter (Signed)
Urine dropped off U/A was normal -- see results in Epic  Please advise if you want to run a culture given her symptoms

## 2018-11-25 ENCOUNTER — Encounter: Payer: Self-pay | Admitting: Family Medicine

## 2018-11-25 ENCOUNTER — Ambulatory Visit: Payer: 59 | Admitting: Family Medicine

## 2018-11-25 ENCOUNTER — Other Ambulatory Visit: Payer: Self-pay

## 2018-11-25 NOTE — Telephone Encounter (Signed)
Pt has requested to cancel her appt for today  Pt states that she has already spoken with Jackelyn Poling and gotten results.  appt cancelled. Nothing further needed.

## 2018-11-26 LAB — URINE CULTURE
MICRO NUMBER:: 758943
SPECIMEN QUALITY:: ADEQUATE

## 2018-12-10 ENCOUNTER — Encounter: Payer: Self-pay | Admitting: Family Medicine

## 2018-12-10 NOTE — Telephone Encounter (Signed)
Pt scheduled for virtual visit with Debbie on 12/11/2018 at 300pm

## 2018-12-10 NOTE — Telephone Encounter (Signed)
Pt aware that I am sending this message to you.  Pt aware that she can schedule a visit 12/11/2018 if wanting to be seen.

## 2018-12-11 ENCOUNTER — Encounter: Payer: Self-pay | Admitting: Family Medicine

## 2018-12-11 ENCOUNTER — Ambulatory Visit (INDEPENDENT_AMBULATORY_CARE_PROVIDER_SITE_OTHER): Payer: 59 | Admitting: Family Medicine

## 2018-12-11 VITALS — Temp 97.9°F | Ht 64.0 in | Wt 222.0 lb

## 2018-12-11 DIAGNOSIS — R1084 Generalized abdominal pain: Secondary | ICD-10-CM

## 2018-12-11 DIAGNOSIS — R197 Diarrhea, unspecified: Secondary | ICD-10-CM | POA: Diagnosis not present

## 2018-12-11 DIAGNOSIS — J3489 Other specified disorders of nose and nasal sinuses: Secondary | ICD-10-CM

## 2018-12-11 MED ORDER — FLUTICASONE PROPIONATE 50 MCG/ACT NA SUSP: 2.0000 | Freq: Every day | NASAL | 6 refills | Status: DC

## 2018-12-11 NOTE — Progress Notes (Signed)
Virtual Visit via Video Note  I connected with Melinda Porter on 12/11/18 at  3:00 PM EDT by a video enabled telemedicine application and verified that I am speaking with the correct person using two identifiers.  Location: Patient: In her home Provider: [place]   I discussed the limitations of evaluation and management by telemedicine and the availability of in person appointments. The patient expressed understanding and agreed to proceed.  History of Present Illness: Chief Complaint  Patient presents with  . Nausea    nausea, headache, diarrhea, stomach bloating and runny nose  fever low grade   This is 48 yo female who presents today with above cc. Symptoms started approximately 1 month ago. She had a negative covid test 11/19/2018.    Nasal symptoms-she has been having a runny nose with mostly clear drainage, she has had a couple of episodes of epistaxis.  She is having quite a bit of postnasal drainage and nasal congestion.  No fever.  She does not take antihistamines due to her Sjogren's.  Abdominal pain- she reports that this pain started in her stomach area and now feels like it is all around to her back.  She is having some occasional heartburn.  She is taking Protonix daily.  The pain is constant and she feels bloated.  She had an negative urine culture 11/24/2018.  She was having diarrhea after eating.  It is brown and watery.  There is no blood.  This pain and diarrhea are different than when she had her colitis.  She has not been eating very much.  She had some cereal this morning and some crackers yesterday.  She is drinking water and Gatorade and not having any problems with vomiting.  She has not been on any recent antibiotics.  She reports increasing fatigue.  Rash- she had a rash under her right breast for about 3 weeks.  She was applying different foods counter creams to it without improvement.  Her mom got her some Zeasorb which did clear up the rash.  She is  noticing now that she has a rash beginning under the left breast.  She had blood work at Dr. Ileene Hutchinson office on December 02, 2018.   Past Medical History:  Diagnosis Date  . Anxiety   . Arthritis   . Depression   . Endometriosis   . Hiatal hernia   . Hyperlipemia   . Hypertension   . Lupus ([place])   . PTSD (post-traumatic stress disorder)    Past Surgical History:  Procedure Laterality Date  . CESAREAN SECTION  1997  . FLEXIBLE SIGMOIDOSCOPY N/A 01/06/2017   Procedure: FLEXIBLE SIGMOIDOSCOPY;  Surgeon: Yetta Flock, MD;  Location: [facility];  Service: Gastroenterology;  Laterality: N/A;  . OOPHORECTOMY Bilateral   . PARTIAL HYSTERECTOMY  07/2009  . TEMPOROMANDIBULAR JOINT SURGERY  2009   right side  . TONSILLECTOMY  2009   Family History  Problem Relation Age of Onset  . Colon polyps Maternal Grandmother   . Lung cancer Maternal Grandmother   . Liver disease Father        agent orange  . Diabetes Maternal Grandfather   . Heart disease Maternal Grandfather   . Hypertension Maternal Grandfather   . Colon cancer Maternal Uncle        great uncle  . Breast cancer Neg Hx    Social History   Tobacco Use  . Smoking status: Former Smoker    Types: Cigarettes    Quit  date: 03/09/1999    Years since quitting: 19.7  . Smokeless tobacco: Never Used  Substance Use Topics  . Alcohol use: No    Alcohol/week: 0.0 standard drinks  . Drug use: No      Observations/Objective: Patient is alert and answers questions appropriately.  She appears ill but in no acute distress.  Visible skin is unremarkable.  Conjunctiva are clear.  She is able to converse in full sentences without shortness of breath, audible wheeze or witnessed cough. Temp 97.9 F (36.6 C)   Ht 5' 4"  (1.626 m)   Wt 222 lb (100.7 kg)   LMP 06/23/2009   BMI 38.11 kg/m  Wt Readings from Last 3 Encounters:  12/11/18 222 lb (100.7 kg)  08/03/18 231 lb 8 oz (105 kg)  01/27/18 214 lb 8 oz (97.3 kg)     Assessment and Plan: 1. Generalized abdominal pain -Unclear etiology, will check abdominal ultrasound to rule out gallbladder disease - US Abdomen Complete; Future -Recent blood work at Dr. Gennette Pac office have requested a copy to see if she needs any additional labs  2. Nasal drainage - fluticasone (FLONASE) 50 MCG/ACT nasal spray; Place 2 sprays into both nostrils daily.  Dispense: 16 g; Refill: 6  3. Diarrhea, unspecified type -Provided information via my chart for bland diet and encouraged patient to progress diet slowly and to continue good fluid intake   Clarene Reamer, FNP-BC  [facility], [place]  12/11/2018 5:34 PM   Follow Up Instructions: Visit recap sent to patient via my chart   I discussed the assessment and treatment plan with the patient. The patient was provided an opportunity to ask questions and all were answered. The patient agreed with the plan and demonstrated an understanding of the instructions.   The patient was advised to call back or seek an in-person evaluation if the symptoms worsen or if the condition fails to improve as anticipated.   Elby Beck, FNP

## 2018-12-17 ENCOUNTER — Other Ambulatory Visit: Payer: Self-pay

## 2018-12-17 DIAGNOSIS — Z20822 Contact with and (suspected) exposure to covid-19: Secondary | ICD-10-CM

## 2018-12-18 ENCOUNTER — Other Ambulatory Visit: Payer: Self-pay | Admitting: Family Medicine

## 2018-12-18 ENCOUNTER — Encounter: Payer: Self-pay | Admitting: Family Medicine

## 2018-12-18 DIAGNOSIS — J0100 Acute maxillary sinusitis, unspecified: Secondary | ICD-10-CM

## 2018-12-18 LAB — NOVEL CORONAVIRUS, NAA: SARS-CoV-2, NAA: NOT DETECTED

## 2018-12-18 MED ORDER — AZITHROMYCIN 250 MG PO TABS: ORAL_TABLET | ORAL | 0 refills | Status: DC

## 2018-12-22 ENCOUNTER — Other Ambulatory Visit: Payer: 59

## 2018-12-28 ENCOUNTER — Other Ambulatory Visit: Payer: 59

## 2018-12-29 ENCOUNTER — Other Ambulatory Visit: Payer: Self-pay | Admitting: Family Medicine

## 2019-01-05 ENCOUNTER — Other Ambulatory Visit: Payer: Self-pay | Admitting: Family Medicine

## 2019-01-05 DIAGNOSIS — Z1231 Encounter for screening mammogram for malignant neoplasm of breast: Secondary | ICD-10-CM

## 2019-01-14 ENCOUNTER — Other Ambulatory Visit: Payer: Self-pay | Admitting: Family Medicine

## 2019-02-01 ENCOUNTER — Encounter: Payer: Self-pay | Admitting: Family Medicine

## 2019-02-11 ENCOUNTER — Other Ambulatory Visit: Payer: Self-pay | Admitting: Family Medicine

## 2019-02-12 NOTE — Telephone Encounter (Signed)
Please advise, thanks.

## 2019-02-14 ENCOUNTER — Encounter: Payer: Self-pay | Admitting: Family Medicine

## 2019-02-15 ENCOUNTER — Encounter: Payer: Self-pay | Admitting: Family Medicine

## 2019-02-15 ENCOUNTER — Ambulatory Visit (INDEPENDENT_AMBULATORY_CARE_PROVIDER_SITE_OTHER): Payer: 59 | Admitting: Internal Medicine

## 2019-02-15 ENCOUNTER — Other Ambulatory Visit: Payer: Self-pay | Admitting: Internal Medicine

## 2019-02-15 DIAGNOSIS — R059 Cough, unspecified: Secondary | ICD-10-CM

## 2019-02-15 DIAGNOSIS — R0989 Other specified symptoms and signs involving the circulatory and respiratory systems: Secondary | ICD-10-CM | POA: Diagnosis not present

## 2019-02-15 DIAGNOSIS — R519 Headache, unspecified: Secondary | ICD-10-CM

## 2019-02-15 DIAGNOSIS — R05 Cough: Secondary | ICD-10-CM | POA: Diagnosis not present

## 2019-02-15 NOTE — Progress Notes (Signed)
Acute Office Visit  Subjective:    Patient ID: Melinda Porter, female    DOB: May 05, 1970, 48 y.o.   MRN: [id number]  Chief Complaint  Patient presents with  . Cough    nasal drainage--clear mucus....mostly nonproductive.... x 2 days... unsure on sick contacts... pt has tried OTC Alka Seltzer cold w/ minimal relief  . Headache    all over head... unable to get vitals    HPI Patient states she woke up Saturday with drainage, cough, and a bad headache. She has been taking alka seltzer plus but hasnot been working. Her headache is whole head and thinks is due ot coughing so much. She has a dry cough but feels like somehting is there. She has nasal drainage but clear. No watery or itchy eyes. She has history sjogrens. She has history of lupus. Woke up in middle of night Sunday and was not usre if she had fever but her clothes were drenches, she has not been taking temp. Denies CP, SOB, denies n/v/d/, loss of smell. Her friend son got tested and she picked pt up on same car. Came back negative.     Past Medical History:  Diagnosis Date  . Anxiety   . Arthritis   . Depression   . Endometriosis   . Hiatal hernia   . Hyperlipemia   . Hypertension   . Lupus ([place])   . PTSD (post-traumatic stress disorder)     Past Surgical History:  Procedure Laterality Date  . CESAREAN SECTION  1997  . FLEXIBLE SIGMOIDOSCOPY N/A 01/06/2017   Procedure: FLEXIBLE SIGMOIDOSCOPY;  Surgeon: Yetta Flock, MD;  Location: [facility];  Service: Gastroenterology;  Laterality: N/A;  . OOPHORECTOMY Bilateral   . PARTIAL HYSTERECTOMY  07/2009  . TEMPOROMANDIBULAR JOINT SURGERY  2009   right side  . TONSILLECTOMY  2009    Family History  Problem Relation Age of Onset  . Colon polyps Maternal Grandmother   . Lung cancer Maternal Grandmother   . Liver disease Father        agent orange  . Diabetes Maternal Grandfather   . Heart disease Maternal Grandfather   . Hypertension Maternal Grandfather    . Colon cancer Maternal Uncle        great uncle  . Breast cancer Neg Hx     Social History   Socioeconomic History  . Marital status: Divorced    Spouse name: Not on file  . Number of children: 1  . Years of education: Not on file  . Highest education level: Not on file  Occupational History  . 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 Financial resource strain: Not on file  . Food insecurity    Worry: Not on file    Inability: Not on file  . Transportation needs    Medical: Not on file    Non-medical: Not on file  Tobacco Use  . Smoking status: Former Smoker    Types: Cigarettes    Quit date: 03/09/1999    Years since quitting: 19.9  . Smokeless tobacco: Never Used  Substance and Sexual Activity  . Alcohol use: No    Alcohol/week: 0.0 standard drinks  . Drug use: No  . Sexual activity: Yes    Partners: Male    Birth control/protection: Surgical  Lifestyle  . Physical activity    Days per week: Not on file    Minutes per session: Not on file  . Stress: Not on file  Relationships  . Social Herbalist on phone: Not on file    Gets together: Not on file    Attends religious service: Not on file    Active member of club or organization: Not on file    Attends meetings of clubs or organizations: Not on file    Relationship status: Not on file  . Intimate partner violence    Fear of current or ex partner: Not on file    Emotionally abused: Not on file    Physically abused: Not on file    Forced sexual activity: Not on file  Other Topics Concern  . Not on file  Social History Narrative  . Not on file    Outpatient Medications Prior to Visit  Medication Sig Dispense Refill  . abatacept (ORENCIA) 250 MG injection Inject into the vein.    [name] azithromycin (ZITHROMAX) 250 MG tablet Take 2 tabs PO x 1 dose, then 1 tab PO QD x 4 days 6 tablet 0  . clonazePAM (KLONOPIN) 1 MG tablet TAKE 1 TABLET BY MOUTH 3 TIMES A DAY AS NEEDED FOR ANXIETY 270 tablet  0  . cyclobenzaprine (FLEXERIL) 10 MG tablet Take 1 tablet (10 mg total) by mouth 3 (three) times daily as needed for muscle spasms. 60 tablet 1  . diphenoxylate-atropine (LOMOTIL) 2.5-0.025 MG tablet Take 1 tablet by mouth 3 (three) times daily as needed for diarrhea or loose stools. 60 tablet 2  . fluticasone (FLONASE) 50 MCG/ACT nasal spray Place 2 sprays into both nostrils daily. 16 g 6  . hydroxychloroquine (PLAQUENIL) 200 MG tablet Take 200 mg by mouth 2 (two) times daily.    [name] ibuprofen (ADVIL,MOTRIN) 600 MG tablet Take 1 tablet (600 mg total) by mouth every 6 (six) hours as needed. 20 tablet 0  . nystatin (MYCOSTATIN/NYSTOP) 100000 UNIT/GM POWD For use as recommended per package insert to affected areas (under breast) 15 g 0  . ondansetron (ZOFRAN ODT) 4 MG disintegrating tablet Take 1 tablet (4 mg total) by mouth every 8 (eight) hours as needed for nausea or vomiting. 20 tablet 0  . OTEZLA 30 MG TABS Take 1 tablet by mouth 2 (two) times daily.    . pantoprazole (PROTONIX) 40 MG tablet TAKE 1 TABLET BY MOUTH EVERY DAY 90 tablet 1  . sertraline (ZOLOFT) 100 MG tablet TAKE 1.5 TABLETS (150 MG TOTAL) DAILY BY MOUTH. 135 tablet 1  . topiramate (TOPAMAX) 100 MG tablet TAKE 1 TABLET BY MOUTH TWICE A DAY 180 tablet 1  . valACYclovir (VALTREX) 1000 MG tablet Take 1 tablet (1,000 mg total) by mouth every 8 (eight) hours. 21 tablet 0  . VITAMIN D HIGH POTENCY 25 MCG (1000 UT) capsule TAKE 1 CAPSULE (1,000 UNITS TOTAL) BY MOUTH DAILY. 90 capsule 3  . benzonatate (TESSALON) 100 MG capsule Take 1-2 capsules (100-200 mg total) by mouth 3 (three) times daily as needed for cough. 40 capsule 0   Facility-Administered Medications Prior to Visit  Medication Dose Route Frequency Provider Last Rate Last Dose  . 0.9 %  sodium chloride infusion  500 mL Intravenous Once Irene Shipper, MD        Allergies  Allergen Reactions  . Lyrica [Pregabalin] Shortness Of Breath  . Sulfa Antibiotics Rash  .  Ciprofloxacin Nausea Only  . Gabapentin     Stroke like symptoms  . Nsaids Other (See Comments)    Liver problems- can't take tylenol, ibuprofen etc.  . Tolmetin  Other reaction(s): Other (See Comments) Liver problems- can't take tylenol, ibuprofen etc.  . Codeine Rash  . Humira [Adalimumab] Rash    Worsening of Lupus rash.  . Morphine And Related Rash    "Wired up"  . Penicillins Rash    Has patient had a PCN reaction causing immediate rash, facial/tongue/throat swelling, SOB or lightheadedness with hypotension: NO Has patient had a PCN reaction causing severe rash involving mucus membranes or skin necrosis: NO Has patient had a PCN reaction that required hospitalization NO Has patient had a PCN reaction occurring within the last 10 years: NO If all of the above answers are "NO", then may proceed with Cephalosporin use.    ROS     Objective:    Physical Exam  LMP 06/23/2009  Wt Readings from Last 3 Encounters:  12/11/18 100.7 kg  08/03/18 105 kg  01/27/18 97.3 kg    Health Maintenance Due  Topic Date Due  . PNEUMOCOCCAL POLYSACCHARIDE VACCINE AGE 27-64 HIGH RISK  07/14/1974  . FOOT EXAM  07/14/1982  . OPHTHALMOLOGY EXAM  07/14/1982  . URINE MICROALBUMIN  07/14/1982  . INFLUENZA VACCINE  11/14/2018  . HEMOGLOBIN A1C  02/02/2019    There are no preventive care reminders to display for this patient.   Lab Results  Component Value Date   TSH 3.49 09/12/2017   Lab Results  Component Value Date   WBC 8.7 06/15/2018   HGB 12.4 06/15/2018   HCT 39 06/15/2018   MCV 73.8 (L) 10/02/2017   PLT 167 06/15/2018   Lab Results  Component Value Date   NA 143 08/04/2018   K 3.9 08/04/2018   CO2 22 08/04/2018   GLUCOSE 108 (H) 08/04/2018   BUN 12 08/04/2018   CREATININE 0.77 08/04/2018   BILITOT 0.2 08/04/2018   ALKPHOS 77 08/04/2018   AST 11 08/04/2018   ALT 10 08/04/2018   PROT 6.4 08/04/2018   ALBUMIN 4.2 08/04/2018   CALCIUM 9.1 08/04/2018   ANIONGAP 11  07/10/2017   GFR 80.68 08/04/2018   Lab Results  Component Value Date   CHOL 203 (H) 08/03/2018   Lab Results  Component Value Date   HDL 39.50 08/03/2018   No results found for: [facility] Lab Results  Component Value Date   TRIG 258.0 (H) 08/03/2018   Lab Results  Component Value Date   CHOLHDL 5 08/03/2018   Lab Results  Component Value Date   HGBA1C 5.7 08/03/2018       Assessment & Plan:   Tested for COVID 8:30-3:30 Symptomatic care: rest, fluids, Aleve for HA Zyrtec and flonase for drainage Self-quarantine until results are back  Problem List Items Addressed This Visit    None       No orders of the defined types were placed in this encounter.    Leward Quan, Student-PA

## 2019-02-15 NOTE — Progress Notes (Signed)
Virtual Visit via Video Note  I connected with Melinda Porter on 02/15/19 at 11:15 AM EST by a video enabled telemedicine application and verified that I am speaking with the correct person using two identifiers.  Location: Patient: Home Provider: Office   I discussed the limitations of evaluation and management by telemedicine and the availability of in person appointments. The patient expressed understanding and agreed to proceed.  History of Present Illness:  Pt reports headache, runny nose and cough. This started 2 days ago. The headache is located all over her head. She describes the pain as pressure and throbbing. She denies dizziness or visual changes. She is blowing clear mucous out of her nose. The cough is non productive. She denies nasal congestion, ear pain, sore throat or shortness of breath. She is unsure if she has run a fever but reports she wokeup in a sweat yesterday. She denies chills or body aches. She has taken [employer] as needed with minimal relief. She has had contact with someone who had similar symptoms but did not test positive for COVID.  Past Medical History:  Diagnosis Date  . Anxiety   . Arthritis   . Depression   . Endometriosis   . Hiatal hernia   . Hyperlipemia   . Hypertension   . Lupus ([place])   . PTSD (post-traumatic stress disorder)     Current Outpatient Medications  Medication Sig Dispense Refill  . abatacept (ORENCIA) 250 MG injection Inject into the vein.    [name] azithromycin (ZITHROMAX) 250 MG tablet Take 2 tabs PO x 1 dose, then 1 tab PO QD x 4 days 6 tablet 0  . clonazePAM (KLONOPIN) 1 MG tablet TAKE 1 TABLET BY MOUTH 3 TIMES A DAY AS NEEDED FOR ANXIETY 270 tablet 0  . cyclobenzaprine (FLEXERIL) 10 MG tablet Take 1 tablet (10 mg total) by mouth 3 (three) times daily as needed for muscle spasms. 60 tablet 1  . diphenoxylate-atropine (LOMOTIL) 2.5-0.025 MG tablet Take 1 tablet by mouth 3 (three) times daily as needed for diarrhea or loose  stools. 60 tablet 2  . fluticasone (FLONASE) 50 MCG/ACT nasal spray Place 2 sprays into both nostrils daily. 16 g 6  . hydroxychloroquine (PLAQUENIL) 200 MG tablet Take 200 mg by mouth 2 (two) times daily.    [name] ibuprofen (ADVIL,MOTRIN) 600 MG tablet Take 1 tablet (600 mg total) by mouth every 6 (six) hours as needed. 20 tablet 0  . nystatin (MYCOSTATIN/NYSTOP) 100000 UNIT/GM POWD For use as recommended per package insert to affected areas (under breast) 15 g 0  . ondansetron (ZOFRAN ODT) 4 MG disintegrating tablet Take 1 tablet (4 mg total) by mouth every 8 (eight) hours as needed for nausea or vomiting. 20 tablet 0  . OTEZLA 30 MG TABS Take 1 tablet by mouth 2 (two) times daily.    . pantoprazole (PROTONIX) 40 MG tablet TAKE 1 TABLET BY MOUTH EVERY DAY 90 tablet 1  . sertraline (ZOLOFT) 100 MG tablet TAKE 1.5 TABLETS (150 MG TOTAL) DAILY BY MOUTH. 135 tablet 1  . topiramate (TOPAMAX) 100 MG tablet TAKE 1 TABLET BY MOUTH TWICE A DAY 180 tablet 1  . valACYclovir (VALTREX) 1000 MG tablet Take 1 tablet (1,000 mg total) by mouth every 8 (eight) hours. 21 tablet 0  . VITAMIN D HIGH POTENCY 25 MCG (1000 UT) capsule TAKE 1 CAPSULE (1,000 UNITS TOTAL) BY MOUTH DAILY. 90 capsule 3   Current Facility-Administered Medications  Medication Dose Route Frequency Provider Last  Rate Last Dose  . 0.9 %  sodium chloride infusion  500 mL Intravenous Once Irene Shipper, MD        Allergies  Allergen Reactions  . Lyrica [Pregabalin] Shortness Of Breath  . Sulfa Antibiotics Rash  . Ciprofloxacin Nausea Only  . Gabapentin     Stroke like symptoms  . Nsaids Other (See Comments)    Liver problems- can't take tylenol, ibuprofen etc.  . Tolmetin     Other reaction(s): Other (See Comments) Liver problems- can't take tylenol, ibuprofen etc.  . Codeine Rash  . Humira [Adalimumab] Rash    Worsening of Lupus rash.  . Morphine And Related Rash    "Wired up"  . Penicillins Rash    Has patient had a PCN reaction  causing immediate rash, facial/tongue/throat swelling, SOB or lightheadedness with hypotension: NO Has patient had a PCN reaction causing severe rash involving mucus membranes or skin necrosis: NO Has patient had a PCN reaction that required hospitalization NO Has patient had a PCN reaction occurring within the last 10 years: NO If all of the above answers are "NO", then may proceed with Cephalosporin use.    Family History  Problem Relation Age of Onset  . Colon polyps Maternal Grandmother   . Lung cancer Maternal Grandmother   . Liver disease Father        agent orange  . Diabetes Maternal Grandfather   . Heart disease Maternal Grandfather   . Hypertension Maternal Grandfather   . Colon cancer Maternal Uncle        great uncle  . Breast cancer Neg Hx     Social History   Socioeconomic History  . Marital status: Divorced    Spouse name: Not on file  . Number of children: 1  . Years of education: Not on file  . Highest education level: Not on file  Occupational History  . 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 Financial resource strain: Not on file  . Food insecurity    Worry: Not on file    Inability: Not on file  . Transportation needs    Medical: Not on file    Non-medical: Not on file  Tobacco Use  . Smoking status: Former Smoker    Types: Cigarettes    Quit date: 03/09/1999    Years since quitting: 19.9  . Smokeless tobacco: Never Used  Substance and Sexual Activity  . Alcohol use: No    Alcohol/week: 0.0 standard drinks  . Drug use: No  . Sexual activity: Yes    Partners: Male    Birth control/protection: Surgical  Lifestyle  . Physical activity    Days per week: Not on file    Minutes per session: Not on file  . Stress: Not on file  Relationships  . Social Herbalist on phone: Not on file    Gets together: Not on file    Attends religious service: Not on file    Active member of club or organization: Not on file    Attends  meetings of clubs or organizations: Not on file    Relationship status: Not on file  . Intimate partner violence    Fear of current or ex partner: Not on file    Emotionally abused: Not on file    Physically abused: Not on file    Forced sexual activity: Not on file  Other Topics Concern  . Not on file  Social History Narrative  .  Not on file     Constitutional: Denies fever, malaise, fatigue, headache or abrupt weight changes.  HEENT: Pt reports runny nose. Denies eye pain, eye redness, ear pain, ringing in the ears, wax buildup, nasal congestion, bloody nose, or sore throat. Respiratory: Pt reports cough. Denies difficulty breathing, shortness of breath, or sputum production.   Cardiovascular: Denies chest pain, chest tightness, palpitations or swelling in the hands or feet.   No other specific complaints in a complete review of systems (except as listed in HPI above).  Observations/Objective:  Wt Readings from Last 3 Encounters:  12/11/18 222 lb (100.7 kg)  08/03/18 231 lb 8 oz (105 kg)  01/27/18 214 lb 8 oz (97.3 kg)    General: Appears her stated age, appears to not feel well but in NAD. Skin: Warm, dry and intact. No rashesnoted. HEENT: Nose: does not sound congested Pulmonary/Chest: Normal effort. No respiratory distress. Neurological: Alert and oriented.    BMET    Component Value Date/Time   NA 143 08/04/2018 0828   NA 144 06/15/2018   K 3.9 08/04/2018 0828   CL 110 08/04/2018 0828   CO2 22 08/04/2018 0828   GLUCOSE 108 (H) 08/04/2018 0828   BUN 12 08/04/2018 0828   BUN 15 06/15/2018   CREATININE 0.77 08/04/2018 0828   CREATININE 0.70 04/09/2017 1619   CALCIUM 9.1 08/04/2018 0828   GFRNONAA >60 07/10/2017 0210   GFRAA >60 07/10/2017 0210    Lipid Panel     Component Value Date/Time   CHOL 203 (H) 08/03/2018 1019   TRIG 258.0 (H) 08/03/2018 1019   HDL 39.50 08/03/2018 1019   CHOLHDL 5 08/03/2018 1019   VLDL 51.6 (H) 08/03/2018 1019    CBC     Component Value Date/Time   WBC 8.7 06/15/2018   WBC 7.9 10/02/2017 1227   RBC 5.48 (H) 10/02/2017 1227   HGB 12.4 06/15/2018   HCT 39 06/15/2018   PLT 167 06/15/2018   MCV 73.8 (L) 10/02/2017 1227   MCH 24.1 (L) 07/10/2017 0210   MCHC 32.8 10/02/2017 1227   RDW 17.0 (H) 10/02/2017 1227   LYMPHSABS 2.1 10/02/2017 1227   MONOABS 0.4 10/02/2017 1227   EOSABS 0.3 10/02/2017 1227   BASOSABS 0.2 (H) 10/02/2017 1227    Hgb A1C Lab Results  Component Value Date   HGBA1C 5.7 08/03/2018       Assessment and Plan:  Headache, Runny Nose, Cough:  She will proceed to the [facility] drive up testing site for novel coronavirus testing Discussed the importance of self quarantine until results are back Encouraged masking, social distancing and frequent handwashing Discussed symptomatic care: rest, fluids, Aleve, Zyrtec and Flonase  ER precautions discussed  Follow Up Instructions:    I discussed the assessment and treatment plan with the patient. The patient was provided an opportunity to ask questions and all were answered. The patient agreed with the plan and demonstrated an understanding of the instructions.   The patient was advised to call back or seek an in-person evaluation if the symptoms worsen or if the condition fails to improve as anticipated.     Webb Silversmith, NP

## 2019-02-15 NOTE — Patient Instructions (Signed)

## 2019-02-16 ENCOUNTER — Other Ambulatory Visit: Payer: Self-pay

## 2019-02-16 DIAGNOSIS — Z20822 Contact with and (suspected) exposure to covid-19: Secondary | ICD-10-CM

## 2019-02-17 LAB — NOVEL CORONAVIRUS, NAA: SARS-CoV-2, NAA: DETECTED — AB

## 2019-02-18 ENCOUNTER — Telehealth: Payer: Self-pay

## 2019-02-18 NOTE — Telephone Encounter (Signed)
Please call and check on pt 2 x weekly until symptoms resolve

## 2019-02-18 NOTE — Telephone Encounter (Signed)
Magda Paganini RN at [facility] left v/m that pt tested positive for covid. Per v/m pt has not been contacted with covid results. Pt had virtual appt with Avie Echevaria NP on 02/15/19. Sending note to Avie Echevaria NP and Glenda Chroman FNP who is PCP but out of office today.

## 2019-02-19 NOTE — Telephone Encounter (Signed)
Noted  

## 2019-02-19 NOTE — Telephone Encounter (Signed)
Called pt to let her know of her Covid test results which was positive... no answer   Left detailed msg on VM per HIPAA

## 2019-02-21 ENCOUNTER — Encounter: Payer: Self-pay | Admitting: Family Medicine

## 2019-02-22 ENCOUNTER — Other Ambulatory Visit: Payer: Self-pay | Admitting: Family Medicine

## 2019-02-22 DIAGNOSIS — F419 Anxiety disorder, unspecified: Secondary | ICD-10-CM

## 2019-02-22 DIAGNOSIS — U071 COVID-19: Secondary | ICD-10-CM

## 2019-02-22 NOTE — Telephone Encounter (Signed)
See mychart message from 02/22/2019, Jackelyn Poling spoke with patient. I filled out Health department form and submitted information. Debbie sent patient a mychart COVID19 follow up

## 2019-02-22 NOTE — Progress Notes (Signed)
Called and spoke to patient regarding positive covid 19 test, reviewed RTC/UC/ER precautions and sent mychart message with instructions for isolation, cleaning.

## 2019-02-24 ENCOUNTER — Telehealth: Payer: Self-pay

## 2019-02-24 ENCOUNTER — Encounter (INDEPENDENT_AMBULATORY_CARE_PROVIDER_SITE_OTHER): Payer: Self-pay

## 2019-02-24 NOTE — Telephone Encounter (Deleted)
-----   Message from [place] sent at 02/18/2019 12:45 PM EST ----- Covid update

## 2019-02-24 NOTE — Telephone Encounter (Signed)
Las refilled 11/23/2018, #270 x 0 refills Last OV 08/03/2018 acute, 11/2018 acute, 02/2019 acute No upcoming visits scheduled.   Please advise.

## 2019-02-24 NOTE — Telephone Encounter (Signed)
Pt reports she does feel better than last week   Are you short of breath today? only with activity  Are you having a cough today? no   Are you experiencing weakness today? Some, but has Lupus  Are you vomiting? No  How is your appetite compared to yesterday? Some decrease in appetite but no concerning per pt   Are you experiencing diarrhea? Loose stool, denies blood in stools  If you have a thermometer, what is your highest temperature (in Fahrenheit)? Has not checked temperature, does not feel that she has had a fever

## 2019-02-24 NOTE — Telephone Encounter (Signed)
Communicable disease form has been faxed to the [place]

## 2019-02-25 ENCOUNTER — Encounter (INDEPENDENT_AMBULATORY_CARE_PROVIDER_SITE_OTHER): Payer: Self-pay

## 2019-02-26 ENCOUNTER — Encounter (INDEPENDENT_AMBULATORY_CARE_PROVIDER_SITE_OTHER): Payer: Self-pay

## 2019-02-28 ENCOUNTER — Encounter (INDEPENDENT_AMBULATORY_CARE_PROVIDER_SITE_OTHER): Payer: Self-pay

## 2019-03-01 ENCOUNTER — Encounter (INDEPENDENT_AMBULATORY_CARE_PROVIDER_SITE_OTHER): Payer: Self-pay

## 2019-03-02 ENCOUNTER — Telehealth: Payer: Self-pay

## 2019-03-02 NOTE — Telephone Encounter (Signed)
Pt reports her husband has now been Dx with Covid as well and is on oxygen  Are you short of breath today? only with activity  Are you having a cough today? No      Are you experiencing weakness today? some weakness and fatigue  Are you vomiting? No  How is your appetite compared to yesterday? Some decrease in appetite but no concerning per pt. Still no taste, some smell  Are you experiencing diarrhea? No changes, still loose stool  If you have a thermometer, what is your highest temperature (in Fahrenheit)? Still no fever

## 2019-03-03 ENCOUNTER — Encounter (INDEPENDENT_AMBULATORY_CARE_PROVIDER_SITE_OTHER): Payer: Self-pay

## 2019-03-04 ENCOUNTER — Telehealth: Payer: Self-pay

## 2019-03-04 ENCOUNTER — Encounter (INDEPENDENT_AMBULATORY_CARE_PROVIDER_SITE_OTHER): Payer: Self-pay

## 2019-03-04 NOTE — Telephone Encounter (Signed)
Patient advise on  protocol for worsening diarrhea:   If diarrhea remains the same: encourage patient to drink oral fluids and bland foods.   Avoid alcohol, spicy foods, caffeine or fatty foods that could make diarrhea worse.   Continue to monitor for signs of dehydration (increased thirst decreased urine output, yellow urine, dry skin, headache or dizziness).   Advise patient to try OTC medication (Imodium, kaopectate, Pepto-Bismol) as per manufacturer's instructions.   If worsening diarrhea occurs and becomes severe (6-7 bowel movements a day): notify PCP   If diarrhea last greater than 7 days: notify PCP   IF SIGNS OF DEHYDRATION OCCUR (INCREASED THIRST, DECREASED URINE OUTPUT, YELLOW URINE, DRY SKIN, HEADACHE OR DIZZINESS) ADVISE PATIENT TO CALL 911 AND SEEK TREATMENT IN THE ED  Patient states that she has had diarrhea for longer than 7 days and had 1 episodes today. She will reach out to her pcp.

## 2019-03-05 ENCOUNTER — Encounter (INDEPENDENT_AMBULATORY_CARE_PROVIDER_SITE_OTHER): Payer: Self-pay

## 2019-03-06 ENCOUNTER — Encounter (INDEPENDENT_AMBULATORY_CARE_PROVIDER_SITE_OTHER): Payer: Self-pay

## 2019-03-07 ENCOUNTER — Encounter (INDEPENDENT_AMBULATORY_CARE_PROVIDER_SITE_OTHER): Payer: Self-pay

## 2019-03-13 ENCOUNTER — Encounter: Payer: Self-pay | Admitting: Family Medicine

## 2019-03-15 ENCOUNTER — Encounter: Payer: Self-pay | Admitting: Family Medicine

## 2019-04-16 HISTORY — PX: COLONOSCOPY: SHX174

## 2019-04-21 ENCOUNTER — Encounter: Payer: Self-pay | Admitting: Family Medicine

## 2019-04-30 ENCOUNTER — Encounter: Payer: Self-pay | Admitting: Family Medicine

## 2019-04-30 ENCOUNTER — Ambulatory Visit (INDEPENDENT_AMBULATORY_CARE_PROVIDER_SITE_OTHER): Payer: 59 | Admitting: Family Medicine

## 2019-04-30 ENCOUNTER — Ambulatory Visit (INDEPENDENT_AMBULATORY_CARE_PROVIDER_SITE_OTHER)
Admission: RE | Admit: 2019-04-30 | Discharge: 2019-04-30 | Disposition: A | Discharge status: Home / Self Care | Payer: 59 | Source: Ambulatory Visit | Attending: Family Medicine | Admitting: Family Medicine

## 2019-04-30 ENCOUNTER — Telehealth: Payer: Self-pay | Admitting: Family Medicine

## 2019-04-30 ENCOUNTER — Other Ambulatory Visit: Payer: Self-pay

## 2019-04-30 VITALS — BP 140/92 | HR 102 | Temp 97.9°F | Ht 64.0 in | Wt 236.0 lb

## 2019-04-30 DIAGNOSIS — Z8616 Personal history of COVID-19: Secondary | ICD-10-CM | POA: Diagnosis not present

## 2019-04-30 DIAGNOSIS — R7303 Prediabetes: Secondary | ICD-10-CM

## 2019-04-30 DIAGNOSIS — R5383 Other fatigue: Secondary | ICD-10-CM | POA: Diagnosis not present

## 2019-04-30 DIAGNOSIS — R0602 Shortness of breath: Secondary | ICD-10-CM

## 2019-04-30 LAB — CBC WITH DIFFERENTIAL/PLATELET
Basophils Absolute: 0.2 10*3/uL — ABNORMAL HIGH (ref 0.0–0.1)
Basophils Relative: 2.2 % (ref 0.0–3.0)
Eosinophils Absolute: 0.4 10*3/uL (ref 0.0–0.7)
Eosinophils Relative: 3.9 % (ref 0.0–5.0)
HCT: 38 % (ref 36.0–46.0)
Hemoglobin: 12 g/dL (ref 12.0–15.0)
Lymphocytes Relative: 28.4 % (ref 12.0–46.0)
Lymphs Abs: 2.6 10*3/uL (ref 0.7–4.0)
MCHC: 31.5 g/dL (ref 30.0–36.0)
MCV: 71.7 fl — ABNORMAL LOW (ref 78.0–100.0)
Monocytes Absolute: 0.4 10*3/uL (ref 0.1–1.0)
Monocytes Relative: 4.8 % (ref 3.0–12.0)
Neutro Abs: 5.6 10*3/uL (ref 1.4–7.7)
Neutrophils Relative %: 60.7 % (ref 43.0–77.0)
Platelets: 193 10*3/uL (ref 150.0–400.0)
RBC: 5.3 Mil/uL — ABNORMAL HIGH (ref 3.87–5.11)
RDW: 17.4 % — ABNORMAL HIGH (ref 11.5–15.5)
WBC: 9.2 10*3/uL (ref 4.0–10.5)

## 2019-04-30 LAB — COMPREHENSIVE METABOLIC PANEL
ALT: 9 U/L (ref 0–35)
AST: 12 U/L (ref 0–37)
Albumin: 4.4 g/dL (ref 3.5–5.2)
Alkaline Phosphatase: 85 U/L (ref 39–117)
BUN: 13 mg/dL (ref 6–23)
CO2: 23 mEq/L (ref 19–32)
Calcium: 8.9 mg/dL (ref 8.4–10.5)
Chloride: 108 mEq/L (ref 96–112)
Creatinine, Ser: 0.8 mg/dL (ref 0.40–1.20)
GFR: 76.95 mL/min (ref >60.00)
Glucose, Bld: 85 mg/dL (ref 70–99)
Potassium: 3.6 mEq/L (ref 3.5–5.1)
Sodium: 140 mEq/L (ref 135–145)
Total Bilirubin: 0.3 mg/dL (ref 0.2–1.2)
Total Protein: 6.9 g/dL (ref 6.0–8.3)

## 2019-04-30 LAB — HEMOGLOBIN A1C: Hgb A1c MFr Bld: 5.6 % (ref 4.6–6.5)

## 2019-04-30 NOTE — Patient Instructions (Addendum)
See if you can identify headache triggers  I will notify you of labs/ chest xray  Work to clean up diet, see below. Important to decrease inflammation, lose weight.   A resource that I like is www.dietdoctor.com/diabetes/diet  Here are some guidelines to help you with meal planning -  Avoid all processed and packaged foods (bread, pasta, crackers, chips, etc) and beverages containing calories.  Avoid added sugars and excessive natural sugars.  Attention to how you feel if you consume artificial sweeteners.  Do they make you more hungry or raise your blood sugar?  With every meal and snack, aim to get 20 g of protein (3 ounces of meat, 4 ounces of fish, 3 eggs, protein powder, 1 cup [place] yogurt, 1 cup cottage cheese, etc.)  Increase fiber in the form of non-starchy vegetables.  These help you feel full with very little carbohydrates and are good for gut health.  Eat 1 serving healthy carb per meal- 1/2 cup brown rice, beans, potato, corn- pay attention to whether or not this significantly raises your blood sugar. If it does, reduce the frequency you consume these.   Eat 2-3 servings of lower sugar fruits daily.  This includes berries, apples, oranges, peaches, pears, one half banana.  Have small amounts of good fats such as avocado, nuts, olive oil, nut butters, olives.  Add a little cheese to your salads to make them tasty.

## 2019-04-30 NOTE — Progress Notes (Signed)
Subjective:    Patient ID: Melinda Porter, female    DOB: Dec 21, 1970, 49 y.o.   MRN: [id number]  HPI Chief Complaint  Patient presents with  . Follow-up    COVID 73   This is a 49 yo female who presents today for follow up of Covid infection.  Was diagnosed 02/16/19.  Continues to have headaches, worse part. Severe, has to go to bed, all over head. Having several days a week. Less severe than during acute infection. Can not identify triggers.  Continues to have nasal drainage. Using flonase with some relief.  Fatigue since infection, going to be early, can't stay up late no matter what. Sleeping 10 hours a night. Feels winded with walking, lying down, talking for too long. Chest hurts with walking. Takes her dogs out several times a day, some SOB if she goes "far," not sure how far she walks. Not sure if any wheezing. . Weight up. Was drinking Gatorade while sick. Had bad lupus flare with Covid infection. Has been following up with rheumatology. Saw [facility] today.   Has an appointment with [facility] in March.   Husband has out of control DM, liver failure. Stays up all night "eating junk," his stepfather recently died during operation. Patient with increased stress with care for her husband who has been on hospice care for the last year.  She feels like he is just waiting to die.  His liver disease sounds stable since she has assumed his care.   Past Medical History:  Diagnosis Date  . Anxiety   . Arthritis   . Depression   . Endometriosis   . Hiatal hernia   . Hyperlipemia   . Hypertension   . Lupus ([place])   . PTSD (post-traumatic stress disorder)    Past Surgical History:  Procedure Laterality Date  . CESAREAN SECTION  1997  . FLEXIBLE SIGMOIDOSCOPY N/A 01/06/2017   Procedure: FLEXIBLE SIGMOIDOSCOPY;  Surgeon: Yetta Flock, MD;  Location: [facility];  Service: Gastroenterology;  Laterality: N/A;  . OOPHORECTOMY Bilateral   . PARTIAL HYSTERECTOMY   07/2009  . TEMPOROMANDIBULAR JOINT SURGERY  2009   right side  . TONSILLECTOMY  2009   Family History  Problem Relation Age of Onset  . Colon polyps Maternal Grandmother   . Lung cancer Maternal Grandmother   . Liver disease Father        agent orange  . Diabetes Maternal Grandfather   . Heart disease Maternal Grandfather   . Hypertension Maternal Grandfather   . Colon cancer Maternal Uncle        great uncle  . Breast cancer Neg Hx    Social History   Tobacco Use  . Smoking status: Former Smoker    Types: Cigarettes    Quit date: 03/09/1999    Years since quitting: 20.1  . Smokeless tobacco: Never Used  Substance Use Topics  . Alcohol use: No    Alcohol/week: 0.0 standard drinks  . Drug use: No      Review of Systems Per HPI    Objective:   Physical Exam Vitals reviewed.  Constitutional:      General: She is not in acute distress.    Appearance: Normal appearance. She is obese. She is not ill-appearing, toxic-appearing or diaphoretic.  HENT:     Head: Normocephalic and atraumatic.  Cardiovascular:     Rate and Rhythm: Normal rate and regular rhythm.     Heart sounds: Normal heart sounds.  Pulmonary:     Effort: Pulmonary effort is normal.     Breath sounds: Decreased air movement (throughout posterior) present. No wheezing, rhonchi or rales.  Musculoskeletal:     Cervical back: Normal range of motion.  Skin:    General: Skin is warm and dry.  Neurological:     General: No focal deficit present.     Mental Status: She is alert and oriented to person, place, and time.  Psychiatric:        Mood and Affect: Mood normal.        Behavior: Behavior normal.        Thought Content: Thought content normal.        Judgment: Judgment normal.       BP (!) 150/96   Pulse 88   Temp 97.9 F (36.6 C)   Ht 5' 4"  (1.626 m)   Wt 236 lb (107 kg)   LMP 06/23/2009   SpO2 96%   BMI 40.51 kg/m  Wt Readings from Last 3 Encounters:  04/30/19 236 lb (107 kg)    12/11/18 222 lb (100.7 kg)  08/03/18 231 lb 8 oz (105 kg)   BP: (!) 140/92(left arm) Doximity readings while ambulating in office 97%.  EKG- Sinus rhythm, rate 69, PR 188, QT 382, low voltage in precordial leads/ negative precordial T waves. Compared to prior tracing 08/03/18, no changes.     Assessment & Plan:  1. Shortness of breath -Possible sequela of COVID-19 virus.  Will check chest x-ray.  She is also gained weight and is deconditioned. - DG Chest 2 View; Future - Comprehensive metabolic panel - CBC with Differential - EKG 12-Lead  2. Fatigue, unspecified type -Per #1 - Comprehensive metabolic panel - CBC with Differential  3. Prediabetes - Hemoglobin A1c  4. History of 2019 novel coronavirus disease (COVID-19) -She is at risk for complications due to her immunocompromise state, autoimmune disorder and obesity.  We discussed that it may take her longer to recover.  I have encouraged her to work on healthy food choices, increasing activity as tolerated -Will determine follow-up based on chest x-ray labs  This visit occurred during the SARS-CoV-2 public health emergency.  Safety protocols were in place, including screening questions prior to the visit, additional usage of staff PPE, and extensive cleaning of exam room while observing appropriate contact time as indicated for disinfecting solutions.    Clarene Reamer, FNP-BC  [facility], [facility]  04/30/2019 2:03 PM

## 2019-04-30 NOTE — Telephone Encounter (Signed)
FMLA paperwork and disability claim form In debbies in box for review and signature

## 2019-05-03 ENCOUNTER — Encounter: Payer: Self-pay | Admitting: Family Medicine

## 2019-05-03 NOTE — Addendum Note (Signed)
Addended by: Clarene Reamer B on: 05/03/2019 09:16 AM   Modules accepted: Orders

## 2019-05-03 NOTE — Telephone Encounter (Signed)
Message sent to patient via [place].  I will write her out for 4 weeks to give her enough time to see pulmonary regarding her condition.  Told her return to Robin's desk for editing.

## 2019-05-05 NOTE — Telephone Encounter (Signed)
Signed and returned to [employer].

## 2019-05-05 NOTE — Telephone Encounter (Signed)
Paperwork faxed Pt aware Copy for pt Copy for scan Per pt request her copy mailed to her

## 2019-05-05 NOTE — Telephone Encounter (Signed)
Paperwork in debbies in box

## 2019-05-14 ENCOUNTER — Institutional Professional Consult (permissible substitution): Payer: 59 | Admitting: Pulmonary Disease

## 2019-05-15 ENCOUNTER — Other Ambulatory Visit: Payer: Self-pay | Admitting: Family Medicine

## 2019-05-15 DIAGNOSIS — B029 Zoster without complications: Secondary | ICD-10-CM

## 2019-05-15 DIAGNOSIS — F419 Anxiety disorder, unspecified: Secondary | ICD-10-CM

## 2019-05-17 ENCOUNTER — Other Ambulatory Visit: Payer: Self-pay | Admitting: Family Medicine

## 2019-05-17 ENCOUNTER — Encounter: Payer: Self-pay | Admitting: Family Medicine

## 2019-05-17 DIAGNOSIS — B029 Zoster without complications: Secondary | ICD-10-CM

## 2019-05-17 DIAGNOSIS — M797 Fibromyalgia: Secondary | ICD-10-CM

## 2019-05-17 DIAGNOSIS — B001 Herpesviral vesicular dermatitis: Secondary | ICD-10-CM

## 2019-05-17 MED ORDER — CYCLOBENZAPRINE HCL 10 MG PO TABS: 10.0000 mg | ORAL_TABLET | Freq: Two times a day (BID) | ORAL | 1 refills | Status: DC | PRN

## 2019-05-17 MED ORDER — VALACYCLOVIR HCL 1 G PO TABS: 2000.0000 mg | ORAL_TABLET | Freq: Two times a day (BID) | ORAL | 0 refills | Status: DC

## 2019-05-17 NOTE — Telephone Encounter (Signed)
Valtrex last filled 12/29/2017 #21 x 0 refills Flexeril last filled 08/02/2016 #60 x 1 refill  Last OV 04/30/2019 Follow up Covid

## 2019-05-17 NOTE — Telephone Encounter (Signed)
Last OV 04/30/19 Last refill Klonopin 02/24/19 #270 x 0 refills Last refill Valtrex 12/29/2017 #21 x 0 refills  Please advise Debbie, thanks.

## 2019-05-28 ENCOUNTER — Ambulatory Visit: Payer: 59

## 2019-06-22 ENCOUNTER — Ambulatory Visit (INDEPENDENT_AMBULATORY_CARE_PROVIDER_SITE_OTHER): Payer: Self-pay | Admitting: Family Medicine

## 2019-06-24 ENCOUNTER — Ambulatory Visit (INDEPENDENT_AMBULATORY_CARE_PROVIDER_SITE_OTHER): Payer: Self-pay | Admitting: Family Medicine

## 2019-07-02 ENCOUNTER — Ambulatory Visit: Payer: 59

## 2019-07-08 ENCOUNTER — Ambulatory Visit (INDEPENDENT_AMBULATORY_CARE_PROVIDER_SITE_OTHER): Payer: Self-pay | Admitting: Family Medicine

## 2019-07-12 ENCOUNTER — Ambulatory Visit (INDEPENDENT_AMBULATORY_CARE_PROVIDER_SITE_OTHER): Payer: 59 | Admitting: Family Medicine

## 2019-07-12 ENCOUNTER — Encounter (INDEPENDENT_AMBULATORY_CARE_PROVIDER_SITE_OTHER): Payer: Self-pay | Admitting: Family Medicine

## 2019-07-12 ENCOUNTER — Other Ambulatory Visit: Payer: Self-pay

## 2019-07-12 ENCOUNTER — Ambulatory Visit (INDEPENDENT_AMBULATORY_CARE_PROVIDER_SITE_OTHER): Payer: Self-pay | Admitting: Bariatrics

## 2019-07-12 VITALS — BP 137/87 | HR 74 | Temp 97.9°F | Ht 63.0 in | Wt 239.0 lb

## 2019-07-12 DIAGNOSIS — Z9189 Other specified personal risk factors, not elsewhere classified: Secondary | ICD-10-CM

## 2019-07-12 DIAGNOSIS — R0602 Shortness of breath: Secondary | ICD-10-CM

## 2019-07-12 DIAGNOSIS — R5383 Other fatigue: Secondary | ICD-10-CM

## 2019-07-12 DIAGNOSIS — K760 Fatty (change of) liver, not elsewhere classified: Secondary | ICD-10-CM | POA: Diagnosis not present

## 2019-07-12 DIAGNOSIS — Z1331 Encounter for screening for depression: Secondary | ICD-10-CM | POA: Diagnosis not present

## 2019-07-12 DIAGNOSIS — D509 Iron deficiency anemia, unspecified: Secondary | ICD-10-CM

## 2019-07-12 DIAGNOSIS — I10 Essential (primary) hypertension: Secondary | ICD-10-CM

## 2019-07-12 DIAGNOSIS — E559 Vitamin D deficiency, unspecified: Secondary | ICD-10-CM

## 2019-07-12 DIAGNOSIS — Z6841 Body Mass Index (BMI) 40.0 and over, adult: Secondary | ICD-10-CM

## 2019-07-12 DIAGNOSIS — Z0289 Encounter for other administrative examinations: Secondary | ICD-10-CM

## 2019-07-13 LAB — COMPREHENSIVE METABOLIC PANEL
ALT: 8 IU/L (ref 0–32)
AST: 13 IU/L (ref 0–40)
Albumin/Globulin Ratio: 2.1 (ref 1.2–2.2)
Albumin: 4.5 g/dL (ref 3.8–4.8)
Alkaline Phosphatase: 94 IU/L (ref 39–117)
BUN/Creatinine Ratio: 15 (ref 9–23)
BUN: 11 mg/dL (ref 6–24)
Bilirubin Total: 0.2 mg/dL (ref 0.0–1.2)
CO2: 19 mmol/L — ABNORMAL LOW (ref 20–29)
Calcium: 9.1 mg/dL (ref 8.7–10.2)
Chloride: 109 mmol/L — ABNORMAL HIGH (ref 96–106)
Creatinine, Ser: 0.72 mg/dL (ref 0.57–1.00)
GFR calc Af Amer: 116 mL/min/{1.73_m2} (ref >59)
GFR calc non Af Amer: 101 mL/min/{1.73_m2} (ref >59)
Globulin, Total: 2.1 g/dL (ref 1.5–4.5)
Glucose: 84 mg/dL (ref 65–99)
Potassium: 4.1 mmol/L (ref 3.5–5.2)
Sodium: 143 mmol/L (ref 134–144)
Total Protein: 6.6 g/dL (ref 6.0–8.5)

## 2019-07-13 LAB — TSH: TSH: 2.28 u[IU]/mL (ref 0.450–4.500)

## 2019-07-13 LAB — CBC WITH DIFFERENTIAL/PLATELET
Basophils Absolute: 0.2 10*3/uL (ref 0.0–0.2)
Basos: 2 %
EOS (ABSOLUTE): 0.3 10*3/uL (ref 0.0–0.4)
Eos: 4 %
Hemoglobin: 11.8 g/dL (ref 11.1–15.9)
Immature Grans (Abs): 0 10*3/uL (ref 0.0–0.1)
Immature Granulocytes: 0 %
Lymphocytes Absolute: 2.4 10*3/uL (ref 0.7–3.1)
Lymphs: 29 %
MCH: 22.3 pg — ABNORMAL LOW (ref 26.6–33.0)
MCHC: 29.9 g/dL — ABNORMAL LOW (ref 31.5–35.7)
MCV: 75 fL — ABNORMAL LOW (ref 79–97)
Monocytes Absolute: 0.5 10*3/uL (ref 0.1–0.9)
Monocytes: 6 %
Neutrophils Absolute: 5 10*3/uL (ref 1.4–7.0)
Neutrophils: 59 %
Platelets: 187 10*3/uL (ref 150–450)
RBC: 5.3 x10E6/uL — ABNORMAL HIGH (ref 3.77–5.28)
RDW: 15.9 % — ABNORMAL HIGH (ref 11.7–15.4)
WBC: 8.4 10*3/uL (ref 3.4–10.8)

## 2019-07-13 LAB — ANEMIA PANEL
Ferritin: 15 ng/mL (ref 15–150)
Folate, Hemolysate: 319 ng/mL
Folate, RBC: 808 ng/mL (ref >498)
Hematocrit: 39.5 % (ref 34.0–46.6)
Iron Saturation: 7 % — CL (ref 15–55)
Iron: 28 ug/dL (ref 27–159)
Retic Ct Pct: 1.1 % (ref 0.6–2.6)
Total Iron Binding Capacity: 421 ug/dL (ref 250–450)
UIBC: 393 ug/dL (ref 131–425)
Vitamin B-12: 953 pg/mL (ref 232–1245)

## 2019-07-13 LAB — LIPID PANEL WITH LDL/HDL RATIO
Cholesterol, Total: 221 mg/dL — ABNORMAL HIGH (ref 100–199)
HDL: 45 mg/dL (ref >39)
LDL Chol Calc (NIH): 139 mg/dL — ABNORMAL HIGH (ref 0–99)
LDL/HDL Ratio: 3.1 ratio (ref 0.0–3.2)
Triglycerides: 205 mg/dL — ABNORMAL HIGH (ref 0–149)
VLDL Cholesterol Cal: 37 mg/dL (ref 5–40)

## 2019-07-13 LAB — T4, FREE: Free T4: 0.75 ng/dL — ABNORMAL LOW (ref 0.82–1.77)

## 2019-07-13 LAB — T3: T3, Total: 83 ng/dL (ref 71–180)

## 2019-07-13 LAB — FOLATE: Folate: 4 ng/mL (ref >3.0)

## 2019-07-13 LAB — INSULIN, RANDOM: INSULIN: 35.1 u[IU]/mL — ABNORMAL HIGH (ref 2.6–24.9)

## 2019-07-13 LAB — VITAMIN D 25 HYDROXY (VIT D DEFICIENCY, FRACTURES): Vit D, 25-Hydroxy: 24 ng/mL — ABNORMAL LOW (ref 30.0–100.0)

## 2019-07-13 NOTE — Progress Notes (Signed)
Chief Complaint:   Melinda Porter (MR# [id number]) is a 49 y.o. female who presents for evaluation and treatment of obesity and related comorbidities. Current BMI is Body mass index is 42.34 kg/m. Jarah has been struggling with her weight for many years and has been unsuccessful in either losing weight, maintaining weight loss, or reaching her healthy weight goal.  Makinna is currently in the action stage of change and ready to dedicate time achieving and maintaining a healthier weight. Katalyn is interested in becoming our patient and working on intensive lifestyle modifications including (but not limited to) diet and exercise for weight loss.  Jareli's friend Marlowe Kays works here.  She previously attended [employer] weight loss program and lost 90 pounds.  She can't eat eggs or red meat.  She doesn't like cereal.  Breakfast was doing Slimfast powder protein smoothie.  Lunch was tortilla with deli meat ([place] or chicken 3-4 slices) with cream cheese.  Dinner - Pasta or rice (less than 1 cup with vegetables).  After work she would have 1 chocolate candy bar.  Little's habits were reviewed today and are as follows: Her family eats meals together, she thinks her family will eat healthier with her, her desired weight loss is 79 pounds, she has been heavy most of her life, she started gaining weight in 2012, her heaviest weight ever was 250 pounds, she craves chocolate, she snacks frequently in the evenings, she skips breakfast sometimes and she struggles with emotional eating.  Depression Screen Simisola's Food and Mood (modified PHQ-9) score was 11.  Depression screen [facility] 2/9 07/12/2019  Decreased Interest 3  Down, Depressed, Hopeless 3  PHQ - 2 Score 6  Altered sleeping 1  Tired, decreased energy 2  Change in appetite 1  Feeling bad or failure about yourself  1  Trouble concentrating 0  Moving slowly or fidgety/restless 0  Suicidal thoughts 0  PHQ-9 Score 11    Difficult doing work/chores Somewhat difficult   Subjective:   1. Other fatigue Myka denies daytime somnolence and denies waking up still tired. Patent has a history of symptoms of morning headache and snoring. Ora generally gets 8 or 9 hours of sleep per night, and states that she has generally restful sleep. Snoring is present. Apneic episodes are not present. Epworth Sleepiness Score is 5.  2. Shortness of breath on exertion Zanaiya notes increasing shortness of breath with exercising and seems to be worsening over time with weight gain. She notes getting out of breath sooner with activity than she used to. This has gotten worse recently. Eola denies shortness of breath at rest or orthopnea.  3. Microcytic anemia Lille is not a vegetarian.  She does not have a history of weight loss surgery.  She is on Plaquenil.  CBC Latest Ref Rng & Units 07/12/2019 04/30/2019 06/15/2018  WBC 3.4 - 10.8 x10E3/uL 8.4 9.2 8.7  Hemoglobin 11.1 - 15.9 g/dL 11.8 12.0 12.4  Hematocrit 34.0 - 46.6 % 39.5 38.0 39  Platelets 150 - 450 x10E3/uL 187 193.0 167   Lab Results  Component Value Date   IRON 28 07/12/2019   TIBC 421 07/12/2019   FERRITIN WILL FOLLOW 07/12/2019   Lab Results  Component Value Date   VITAMINB12 953 07/12/2019   4. NAFLD (nonalcoholic fatty liver disease) CT in 2019 showing fatty infiltration.  LFTs normal in labs done by Rheumatology.  Lab Results  Component Value Date   ALT 8 07/12/2019   AST 13  07/12/2019   ALKPHOS 94 07/12/2019   BILITOT 0.2 07/12/2019   5. Vitamin D deficiency Meeka's Vitamin D level was 28.28 on 08/03/2018. She is currently taking OTC vitamin D 1000 each day. She denies nausea, vomiting or muscle weakness.  She endorses fatigue.  6. Essential hypertension Blood pressure elevated today.  Jenai was previously on Maxzide 25 mg daily.  No chest pain, chest pressure, or headache.  BP Readings from Last 3 Encounters:  07/12/19 137/87   04/30/19 (!) 140/92  08/03/18 135/87   7. Depression screening Malea was screened for depression as part of her new patient workup.  8. At high risk for impaired function of liver Lucresha is at risk for impaired function of liver due to likely diagnosis of fatty liver as evidenced by recent elevated liver enzymes.   Assessment/Plan:   1. Other fatigue Anylah does feel that her weight is causing her energy to be lower than it should be. Fatigue may be related to obesity, depression or many other causes. Labs will be ordered, and in the meanwhile, Dayleen will focus on self care including making healthy food choices, increasing physical activity and focusing on stress reduction. - EKG 12-Lead - CBC with Differential/Platelet - Insulin, random - Vitamin B12 - Folate - T3 - T4, free - TSH  2. Shortness of breath on exertion Nakema does feel that she gets out of breath more easily that she used to when she exercises. Suraya's shortness of breath appears to be obesity related and exercise induced. She has agreed to work on weight loss and gradually increase exercise to treat her exercise induced shortness of breath. Will continue to monitor closely. - Lipid Panel With LDL/HDL Ratio  3. Microcytic anemia Orders and follow up as documented in patient record.  Will check labs today. - Anemia panel - Vitamin B12  4. NAFLD (nonalcoholic fatty liver disease) Will check CMP today.  5. Vitamin D deficiency Low Vitamin D level contributes to fatigue and are associated with obesity, breast, and colon cancer.  Will check vitamin D level today. - VITAMIN D 25 Hydroxy (Vit-D Deficiency, Fractures)  6. Essential hypertension Dhana is working on healthy weight loss and exercise to improve blood pressure control. We will watch for signs of hypotension as she continues her lifestyle modifications.  7. Depression screening Whitlee had a positive depression screening. Depression is commonly  associated with obesity and often results in emotional eating behaviors. We will monitor this closely and work on CBT to help improve the non-hunger eating patterns. Referral to Psychology may be required if no improvement is seen as she continues in our clinic.  8. At high risk for impaired function of liver Annarose was given approximately 15 minutes of counseling today regarding prevention of impaired liver function. Jess was educated about her risk of developing NASH or even liver failure and advised that the only proven treatment for NAFLD was weight loss of at least 5-10% of body weight.   9. Class 3 severe obesity with serious comorbidity and body mass index (BMI) of 40.0 to 44.9 in adult, unspecified obesity type (HCC) Aijah is currently in the action stage of change and her goal is to continue with weight loss efforts. I recommend Siddalee begin the structured treatment plan as follows:  She has agreed to the Category 2 Plan.  Exercise goals: No exercise has been prescribed at this time.   Behavioral modification strategies: increasing lean protein intake, increasing vegetables, meal planning and cooking strategies, keeping healthy  foods in the home and planning for success.  She was informed of the importance of frequent follow-up visits to maximize her success with intensive lifestyle modifications for her multiple health conditions. She was informed we would discuss her lab results at her next visit unless there is a critical issue that needs to be addressed sooner. Mechille agreed to keep her next visit at the agreed upon time to discuss these results.  Objective:   Blood pressure 137/87, pulse 74, temperature 97.9 F (36.6 C), temperature source Oral, height 5' 3"  (1.6 m), weight 239 lb (108.4 kg), last menstrual period 06/23/2009, SpO2 94 %. Body mass index is 42.34 kg/m.  EKG: Normal sinus rhythm, rate 72 bpm.  Indirect Calorimeter completed today shows a VO2 of 210 and a  REE of 1467.  Her calculated basal metabolic rate is 1694 thus her basal metabolic rate is worse than expected.  General: Cooperative, alert, well developed, in no acute distress. HEENT: Conjunctivae and lids unremarkable. Cardiovascular: Regular rhythm.  Lungs: Normal work of breathing. Neurologic: No focal deficits.   Lab Results  Component Value Date   CREATININE 0.72 07/12/2019   BUN 11 07/12/2019   NA 143 07/12/2019   K 4.1 07/12/2019   CL 109 (H) 07/12/2019   CO2 19 (L) 07/12/2019   Lab Results  Component Value Date   ALT 8 07/12/2019   AST 13 07/12/2019   ALKPHOS 94 07/12/2019   BILITOT 0.2 07/12/2019   Lab Results  Component Value Date   HGBA1C 5.6 04/30/2019   HGBA1C 5.7 08/03/2018   HGBA1C 5.6 09/12/2017   HGBA1C 5.5 02/26/2017   HGBA1C 5.6 09/27/2016   Lab Results  Component Value Date   INSULIN WILL FOLLOW 07/12/2019   Lab Results  Component Value Date   TSH 2.280 07/12/2019   Lab Results  Component Value Date   CHOL 221 (H) 07/12/2019   HDL 45 07/12/2019   LDLCALC 139 (H) 07/12/2019   LDLDIRECT 128.0 08/03/2018   TRIG 205 (H) 07/12/2019   CHOLHDL 5 08/03/2018   Lab Results  Component Value Date   WBC 8.4 07/12/2019   HGB 11.8 07/12/2019   HCT 39.5 07/12/2019   MCV 75 (L) 07/12/2019   PLT 187 07/12/2019   Lab Results  Component Value Date   IRON 28 07/12/2019   TIBC 421 07/12/2019   FERRITIN WILL FOLLOW 07/12/2019   Attestation Statements:   Reviewed by clinician on day of visit: allergies, medications, problem list, medical history, surgical history, family history, social history, and previous encounter notes.  I, Water quality scientist, CMA, am acting as transcriptionist for Coralie Common, MD.  I have reviewed the above documentation for accuracy and completeness, and I agree with the above. - Ilene Qua, MD

## 2019-07-26 ENCOUNTER — Other Ambulatory Visit: Payer: Self-pay

## 2019-07-26 ENCOUNTER — Encounter (INDEPENDENT_AMBULATORY_CARE_PROVIDER_SITE_OTHER): Payer: Self-pay | Admitting: Family Medicine

## 2019-07-26 ENCOUNTER — Ambulatory Visit (INDEPENDENT_AMBULATORY_CARE_PROVIDER_SITE_OTHER): Payer: 59 | Admitting: Family Medicine

## 2019-07-26 VITALS — BP 128/86 | HR 83 | Temp 98.2°F | Ht 63.0 in | Wt 237.0 lb

## 2019-07-26 DIAGNOSIS — E7849 Other hyperlipidemia: Secondary | ICD-10-CM

## 2019-07-26 DIAGNOSIS — Z6841 Body Mass Index (BMI) 40.0 and over, adult: Secondary | ICD-10-CM

## 2019-07-26 DIAGNOSIS — E559 Vitamin D deficiency, unspecified: Secondary | ICD-10-CM | POA: Diagnosis not present

## 2019-07-26 DIAGNOSIS — R7303 Prediabetes: Secondary | ICD-10-CM

## 2019-07-26 DIAGNOSIS — D509 Iron deficiency anemia, unspecified: Secondary | ICD-10-CM | POA: Diagnosis not present

## 2019-07-26 DIAGNOSIS — Z9189 Other specified personal risk factors, not elsewhere classified: Secondary | ICD-10-CM | POA: Diagnosis not present

## 2019-07-26 MED ORDER — VITAMIN D (ERGOCALCIFEROL) 1.25 MG (50000 UNIT) PO CAPS: 50000.0000 [IU] | ORAL_CAPSULE | ORAL | 0 refills | Status: DC

## 2019-07-27 NOTE — Progress Notes (Signed)
Chief Complaint:   OBESITY Melinda Porter is here to discuss her progress with her obesity treatment plan along with follow-up of her obesity related diagnoses. Melinda Porter is on the Category 2 Plan and states she is following her eating plan approximately 50% of the time. Melinda Porter states she is doing 0 minutes 0 times per week.  Today's visit was #: 2 Starting weight: 239 lbs Starting date: 07/12/2019 Today's weight: 237 lbs Today's date: 07/26/2019 Total lbs lost to date: 2 Total lbs lost since last in-office visit: 2  Interim History: Melinda Porter had a very stressful first 2 weeks. She hurt her back, and her mother in-law fell 3 times. She is still trying to get used to her breakfast, as she doesn't like bread so toast was hard to get used to. She would make some substitutions for peanut butter and sugar free jelly on toast. She did a wrap for lunch with deli meat and cucumber with skinny pop for snack. Dinner was protein 4-6 oz and vegetables.  Subjective:   1. Vitamin D deficiency Melinda Porter's last Vit D level was 24.0 She is on OTC Vit D 1,000 IU daily. She notes fatigue. I discussed labs with the patient today.  2. Pre-diabetes Melinda Porter's Hgb A1c was recently 5.6 and insulin of 35.1. She is not on metformin. I discussed labs with the patient today.  3. Other hyperlipidemia Melinda Porter's LDL was 139, triglycerides 205, and HDL 45. She is not on statin. Her 10 year ASCVD calculation risk is 1.5%. I discussed labs with the patient today.  4. Microcytic anemia Melinda Porter's diagnosis is likely anemia of chronic disease secondary to rheumatology comorbid. I discussed labs with the patient today.  5. At risk for diabetes mellitus Melinda Porter is at higher than average risk for developing diabetes due to her obesity.   Assessment/Plan:   1. Vitamin D deficiency Low Vitamin D level contributes to fatigue and are associated with obesity, breast, and colon cancer. Melinda Porter agreed to start prescription Vitamin D  50,000 IU every week with no refills. She will follow-up for routine testing of Vitamin D, at least 2-3 times per year to avoid over-replacement.  - Vitamin D, Ergocalciferol, (DRISDOL) 1.25 MG (50000 UNIT) CAPS capsule; Take 1 capsule (50,000 Units total) by mouth every 7 (seven) days.  Dispense: 4 capsule; Refill: 0  2. Pre-diabetes Melinda Porter will continue to work on weight loss, exercise, and decreasing simple carbohydrates to help decrease the risk of diabetes. We will follow up on labs in 3 months. If cravings increases or hunger increases, will consider metformin.  3. Other hyperlipidemia Cardiovascular risk and specific lipid/LDL goals reviewed. We discussed several lifestyle modifications today and Melinda Porter will continue to work on diet, exercise and weight loss efforts. We will follow up on labs in 3 months. Orders and follow up as documented in patient record.   Counseling Intensive lifestyle modifications are the first line treatment for this issue. . Dietary changes: Increase soluble fiber. Decrease simple carbohydrates. . Exercise changes: Moderate to vigorous-intensity aerobic activity 150 minutes per week if tolerated. . Lipid-lowering medications: see documented in medical record.  4. Microcytic anemia We will follow up on CBC in 6 months. Orders and follow up as documented in patient record.  Counseling . Iron is essential for our bodies to make red blood cells.  Reasons that someone may be deficient include: an iron-deficient diet (more likely in those following vegan or vegetarian diets), women with heavy menses, patients with GI disorders or poor absorption,  patients that have had bariatric surgery, frequent blood donors, patients with cancer, and patients with heart disease.   Melinda Porter foods include dark leafy greens, red and white meats, eggs, seafood, and beans.   . Certain foods and drinks prevent your body from absorbing iron properly. Avoid eating these foods in the  same meal as iron-rich foods or with iron supplements. These foods include: coffee, black tea, and red wine; milk, dairy products, and foods that are high in calcium; beans and soybeans; whole grains.  . Constipation can be a side effect of iron supplementation. Increased water and fiber intake are helpful. Water goal: > 2 liters/day. Fiber goal: > 25 grams/day.  5. At risk for diabetes mellitus Melinda Porter was given approximately 30 minutes of diabetes education and counseling today. We discussed intensive lifestyle modifications today with an emphasis on weight loss as well as increasing exercise and decreasing simple carbohydrates in her diet. We also reviewed medication options with an emphasis on risk versus benefit of those discussed.   Repetitive spaced learning was employed today to elicit superior memory formation and behavioral change.  6. Class 3 severe obesity with serious comorbidity and body mass index (BMI) of 40.0 to 44.9 in adult, unspecified obesity type (HCC) Melinda Porter is currently in the action stage of change. As such, her goal is to continue with weight loss efforts. She has agreed to the Category 2 Plan and keeping a food journal and adhering to recommended goals of 200-300 calories and 20+ grams of protein at breakfast daily.   Exercise goals: No exercise has been prescribed at this time.  Behavioral modification strategies: increasing lean protein intake, meal planning and cooking strategies, keeping healthy foods in the home and planning for success.  Melinda Porter has agreed to follow-up with our clinic in 2 weeks. She was informed of the importance of frequent follow-up visits to maximize her success with intensive lifestyle modifications for her multiple health conditions.   Objective:   Blood pressure 128/86, pulse 83, temperature 98.2 F (36.8 C), temperature source Oral, height 5' 3"  (1.6 m), weight 237 lb (107.5 kg), last menstrual period 06/23/2009, SpO2 96 %. Body mass  index is 41.98 kg/m.  General: Cooperative, alert, well developed, in no acute distress. HEENT: Conjunctivae and lids unremarkable. Cardiovascular: Regular rhythm.  Lungs: Normal work of breathing. Neurologic: No focal deficits.   Lab Results  Component Value Date   CREATININE 0.72 07/12/2019   BUN 11 07/12/2019   NA 143 07/12/2019   K 4.1 07/12/2019   CL 109 (H) 07/12/2019   CO2 19 (L) 07/12/2019   Lab Results  Component Value Date   ALT 8 07/12/2019   AST 13 07/12/2019   ALKPHOS 94 07/12/2019   BILITOT 0.2 07/12/2019   Lab Results  Component Value Date   HGBA1C 5.6 04/30/2019   HGBA1C 5.7 08/03/2018   HGBA1C 5.6 09/12/2017   HGBA1C 5.5 02/26/2017   HGBA1C 5.6 09/27/2016   Lab Results  Component Value Date   INSULIN 35.1 (H) 07/12/2019   Lab Results  Component Value Date   TSH 2.280 07/12/2019   Lab Results  Component Value Date   CHOL 221 (H) 07/12/2019   HDL 45 07/12/2019   LDLCALC 139 (H) 07/12/2019   LDLDIRECT 128.0 08/03/2018   TRIG 205 (H) 07/12/2019   CHOLHDL 5 08/03/2018   Lab Results  Component Value Date   WBC 8.4 07/12/2019   HGB 11.8 07/12/2019   HCT 39.5 07/12/2019   MCV 75 (L)  07/12/2019   PLT 187 07/12/2019   Lab Results  Component Value Date   IRON 28 07/12/2019   TIBC 421 07/12/2019   FERRITIN 15 07/12/2019   Attestation Statements:   Reviewed by clinician on day of visit: allergies, medications, problem list, medical history, surgical history, family history, social history, and previous encounter notes.   I, Trixie Dredge, am acting as transcriptionist for April Manson, MD.  I have reviewed the above documentation for accuracy and completeness, and I agree with the above. - Ilene Qua, MD

## 2019-08-12 ENCOUNTER — Encounter (INDEPENDENT_AMBULATORY_CARE_PROVIDER_SITE_OTHER): Payer: Self-pay | Admitting: Family Medicine

## 2019-08-12 ENCOUNTER — Other Ambulatory Visit: Payer: Self-pay

## 2019-08-12 ENCOUNTER — Ambulatory Visit (INDEPENDENT_AMBULATORY_CARE_PROVIDER_SITE_OTHER): Payer: 59 | Admitting: Family Medicine

## 2019-08-12 ENCOUNTER — Other Ambulatory Visit: Payer: Self-pay | Admitting: Family Medicine

## 2019-08-12 VITALS — BP 153/76 | HR 81 | Temp 98.4°F | Ht 63.0 in | Wt 237.0 lb

## 2019-08-12 DIAGNOSIS — Z6841 Body Mass Index (BMI) 40.0 and over, adult: Secondary | ICD-10-CM

## 2019-08-12 DIAGNOSIS — R030 Elevated blood-pressure reading, without diagnosis of hypertension: Secondary | ICD-10-CM

## 2019-08-12 DIAGNOSIS — E559 Vitamin D deficiency, unspecified: Secondary | ICD-10-CM

## 2019-08-12 DIAGNOSIS — F419 Anxiety disorder, unspecified: Secondary | ICD-10-CM

## 2019-08-12 DIAGNOSIS — Z9189 Other specified personal risk factors, not elsewhere classified: Secondary | ICD-10-CM | POA: Diagnosis not present

## 2019-08-12 MED ORDER — VITAMIN D (ERGOCALCIFEROL) 1.25 MG (50000 UNIT) PO CAPS: 50000.0000 [IU] | ORAL_CAPSULE | ORAL | 0 refills | Status: DC

## 2019-08-12 NOTE — Progress Notes (Signed)
Chief Complaint:   Melinda Porter is here to discuss her progress with her obesity treatment plan along with follow-up of her obesity related diagnoses. Melinda Porter is on the Category 2 Plan and states she is following her eating plan approximately 80% of the time. Melinda Porter states she is mowing the yard 2 hours 1 time per week and doing yard work 1 hour 2 times per week.  Today's visit was #: 3 Starting weight: 239 lbs Starting date: 07/12/2019 Today's weight: 237 lbs Today's date: 08/12/2019 Total lbs lost to date: 2 Total lbs lost since last in-office visit: 0  Interim History: Melinda Porter has been working and taking care of her husband. She spent most of last weekend in the bed as she felt a flare coming on. For breakfast she is eating Consulting civil engineer sausage on a Carb Balance tortilla; for snack she is doing a frozen fruit bag with pineapple, mango, peach, and strawberries. Her husband is trying to cook on there plan. Melinda Porter is doing lunch meat for a lunch option.  Subjective:   Elevated blood pressure reading. Blood pressure is elevated today. No chest pain, chest pressure, or headache. Melinda Porter is on no medication.  BP Readings from Last 3 Encounters:  08/12/19 (!) 153/76  07/26/19 128/86  07/12/19 137/87   Lab Results  Component Value Date   CREATININE 0.72 07/12/2019   CREATININE 0.80 04/30/2019   CREATININE 0.77 08/04/2018   Vitamin D deficiency. No nausea, vomiting, or muscle weakness. Melinda Porter endorses fatigue. She is on prescription Vitamin D. Last Vitamin D 24.0 on 07/12/2019.  At risk for osteoporosis. Melinda Porter is at higher risk of osteopenia and osteoporosis due to Vitamin D deficiency.   Assessment/Plan:   Elevated blood pressure reading. Melinda Porter is working on healthy weight loss and exercise to improve blood pressure control. We will watch for signs of hypotension as she continues her lifestyle modifications. Will follow-up blood pressure at her next  appointment.  Vitamin D deficiency. Low Vitamin D level contributes to fatigue and are associated with obesity, breast, and colon cancer. She was given a refill on her Vitamin D, Ergocalciferol, (DRISDOL) 1.25 MG (50000 UNIT) CAPS capsule every week #4 with 0 refills and will follow-up for routine testing of Vitamin D, at least 2-3 times per year to avoid over-replacement.    At risk for osteoporosis. Melinda Porter was given approximately 15 minutes of osteoporosis prevention counseling today. Melinda Porter is at risk for osteopenia and osteoporosis due to her Vitamin D deficiency. She was encouraged to take her Vitamin D and follow her higher calcium diet and increase strengthening exercise to help strengthen her bones and decrease her risk of osteopenia and osteoporosis.  Repetitive spaced learning was employed today to elicit superior memory formation and behavioral change.  Class 3 severe obesity with serious comorbidity and body mass index (BMI) of 40.0 to 44.9 in adult, unspecified obesity type ([place]).  Melinda Porter is currently in the action stage of change. As such, her goal is to continue with weight loss efforts. She has agreed to the Category 2 Plan.   Exercise goals: Melinda Porter will continue her current exercise regimen.  Behavioral modification strategies: increasing lean protein intake, increasing vegetables, meal planning and cooking strategies, keeping healthy foods in the home and planning for success.  Melinda Porter has agreed to follow-up with our clinic in 2 weeks. She was informed of the importance of frequent follow-up visits to maximize her success with intensive lifestyle modifications for her multiple health conditions.  Objective:   Blood pressure (!) 153/76, pulse 81, temperature 98.4 F (36.9 C), temperature source Oral, height 5' 3"  (1.6 m), weight 237 lb (107.5 kg), last menstrual period 06/23/2009, SpO2 96 %. Body mass index is 41.98 kg/m.  General: Cooperative, alert, well developed,  in no acute distress. HEENT: Conjunctivae and lids unremarkable. Cardiovascular: Regular rhythm.  Lungs: Normal work of breathing. Neurologic: No focal deficits.   Lab Results  Component Value Date   CREATININE 0.72 07/12/2019   BUN 11 07/12/2019   NA 143 07/12/2019   K 4.1 07/12/2019   CL 109 (H) 07/12/2019   CO2 19 (L) 07/12/2019   Lab Results  Component Value Date   ALT 8 07/12/2019   AST 13 07/12/2019   ALKPHOS 94 07/12/2019   BILITOT 0.2 07/12/2019   Lab Results  Component Value Date   HGBA1C 5.6 04/30/2019   HGBA1C 5.7 08/03/2018   HGBA1C 5.6 09/12/2017   HGBA1C 5.5 02/26/2017   HGBA1C 5.6 09/27/2016   Lab Results  Component Value Date   INSULIN 35.1 (H) 07/12/2019   Lab Results  Component Value Date   TSH 2.280 07/12/2019   Lab Results  Component Value Date   CHOL 221 (H) 07/12/2019   HDL 45 07/12/2019   LDLCALC 139 (H) 07/12/2019   LDLDIRECT 128.0 08/03/2018   TRIG 205 (H) 07/12/2019   CHOLHDL 5 08/03/2018   Lab Results  Component Value Date   WBC 8.4 07/12/2019   HGB 11.8 07/12/2019   HCT 39.5 07/12/2019   MCV 75 (L) 07/12/2019   PLT 187 07/12/2019   Lab Results  Component Value Date   IRON 28 07/12/2019   TIBC 421 07/12/2019   FERRITIN 15 07/12/2019   Attestation Statements:   Reviewed by clinician on day of visit: allergies, medications, problem list, medical history, surgical history, family history, social history, and previous encounter notes.  I, Michaelene Song, am acting as transcriptionist for Coralie Common, MD   I have reviewed the above documentation for accuracy and completeness, and I agree with the above. - Ilene Qua, MD

## 2019-08-13 ENCOUNTER — Other Ambulatory Visit: Payer: Self-pay | Admitting: Family Medicine

## 2019-08-13 NOTE — Telephone Encounter (Signed)
Rx was last refilled on 02/15/19 for #180 with 1 refill. Patient was last seen on 02/15/19 and has no upcoming appts. Jackelyn Poling, is this ok to refill?

## 2019-08-13 NOTE — Telephone Encounter (Signed)
Rx was last refilled on 05/17/19 for #270 with 0 refills. Patient was last seen on 02/15/19 - and has no upcoming appts. Debbie, ok to refill?

## 2019-08-16 ENCOUNTER — Other Ambulatory Visit (INDEPENDENT_AMBULATORY_CARE_PROVIDER_SITE_OTHER): Payer: Self-pay | Admitting: Family Medicine

## 2019-08-16 DIAGNOSIS — E559 Vitamin D deficiency, unspecified: Secondary | ICD-10-CM

## 2019-08-19 ENCOUNTER — Other Ambulatory Visit: Payer: Self-pay | Admitting: Family Medicine

## 2019-08-19 NOTE — Telephone Encounter (Signed)
Last office visit 04/30/2019 for follow up Covid 19.  I don't see Dx of depression or anxiety on problem list and don't see where depression has been addressed in the last year.  Ok to refill?

## 2019-08-20 ENCOUNTER — Encounter: Payer: Self-pay | Admitting: Family Medicine

## 2019-08-20 ENCOUNTER — Other Ambulatory Visit: Payer: Self-pay | Admitting: Family Medicine

## 2019-08-20 DIAGNOSIS — J3489 Other specified disorders of nose and nasal sinuses: Secondary | ICD-10-CM

## 2019-08-23 ENCOUNTER — Ambulatory Visit (INDEPENDENT_AMBULATORY_CARE_PROVIDER_SITE_OTHER): Payer: Self-pay | Admitting: Family Medicine

## 2019-08-26 ENCOUNTER — Ambulatory Visit (INDEPENDENT_AMBULATORY_CARE_PROVIDER_SITE_OTHER): Payer: 59 | Admitting: Family Medicine

## 2019-09-19 IMAGING — CT CT ABD-PELV W/ CM
2 of 5 series · 16 of 46 positions shown, 18 images · IV contrast (ISOVUE)
Comparison: Abdominal CT dated 01/05/2017

CLINICAL DATA: 46-year-old female with right lower quadrant
abdominal pain.

EXAM:
CT ABDOMEN AND PELVIS WITH CONTRAST
TECHNIQUE: Multidetector CT imaging of the abdomen and pelvis was performed
using the standard protocol following bolus administration of
intravenous contrast.
CONTRAST:  100mL 2K2T96-MZZ IOPAMIDOL (2K2T96-MZZ) INJECTION 61%

[Series 2: axial st · axial · 0.76mm/px · z∈[-536,-131]mm · 13 of 91 slices shown, 15 images]
[im 5/91  soft-tissue]
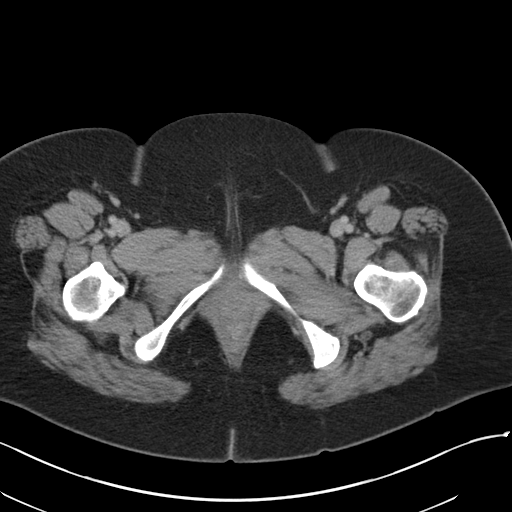
[im 5/91  bone]
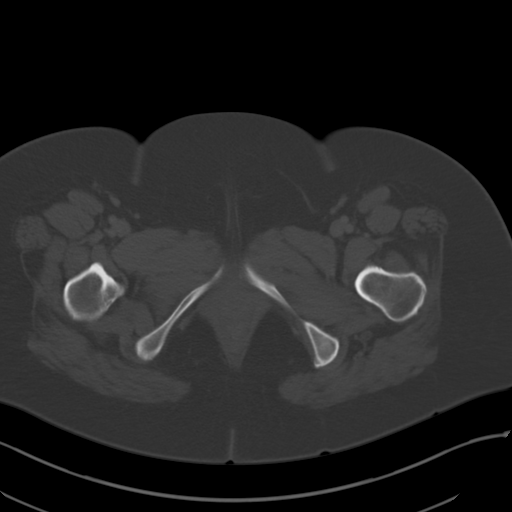
[im 15/91  soft-tissue]
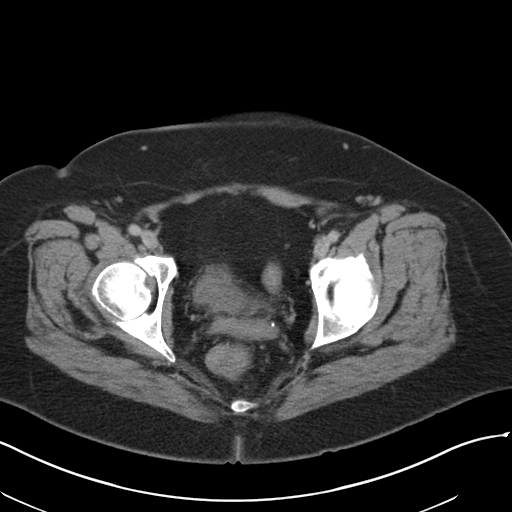
[im 19/91  soft-tissue]
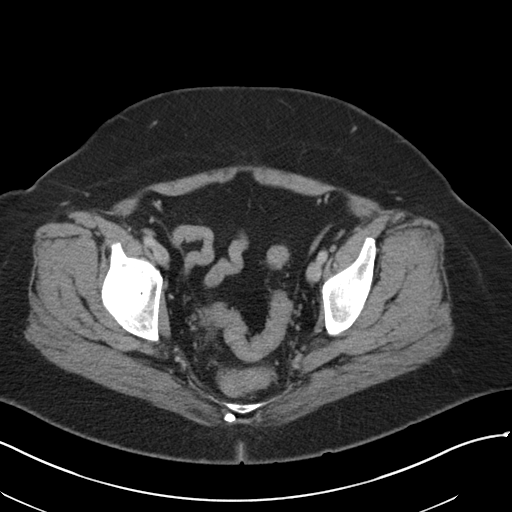
[im 24/91  soft-tissue]
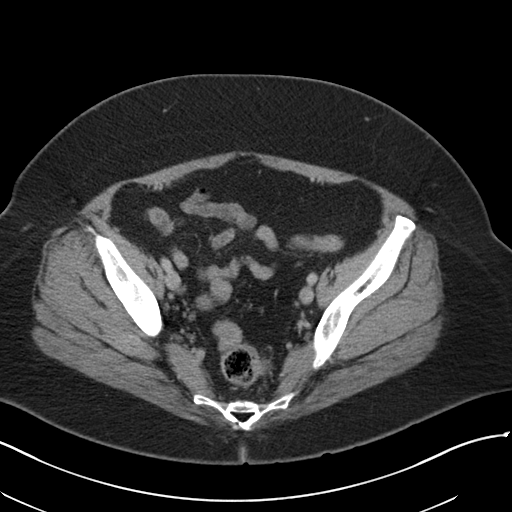
[im 34/91  soft-tissue]
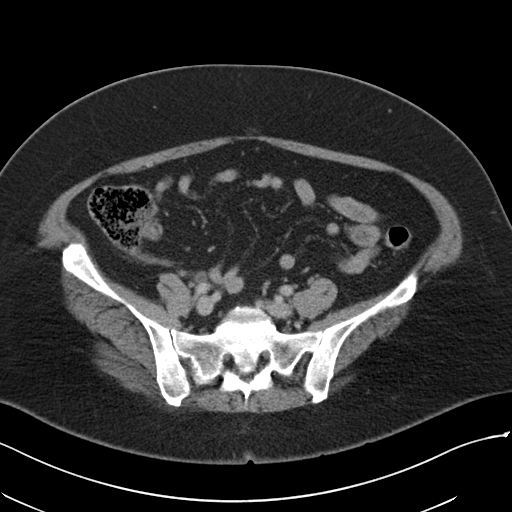
[im 38/91  soft-tissue]
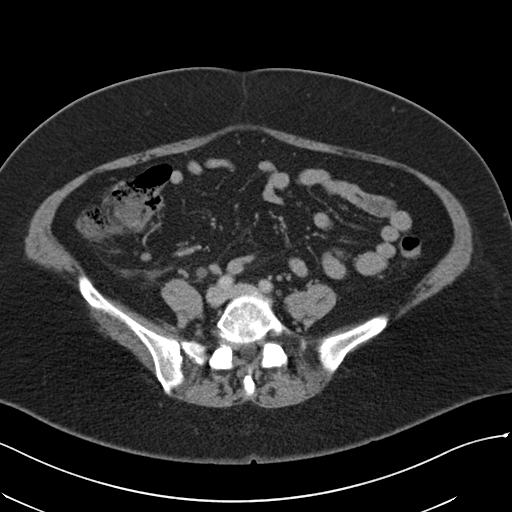
[im 48/91  soft-tissue]
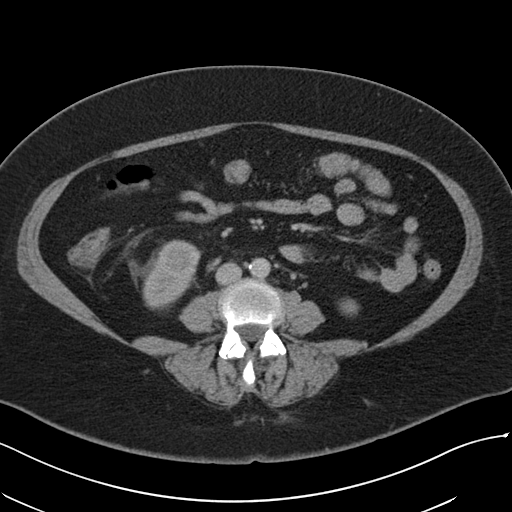
[im 53/91  soft-tissue]
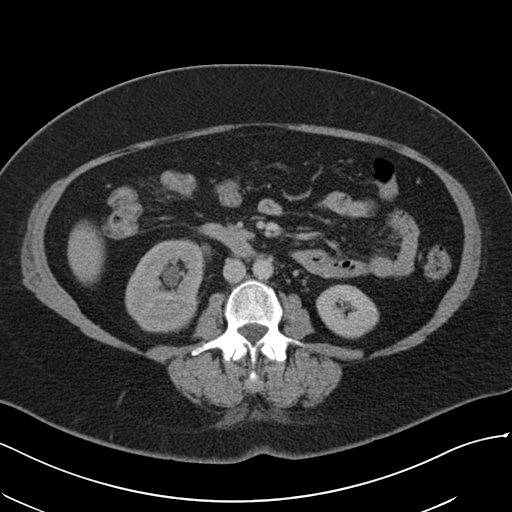
[im 57/91  soft-tissue]
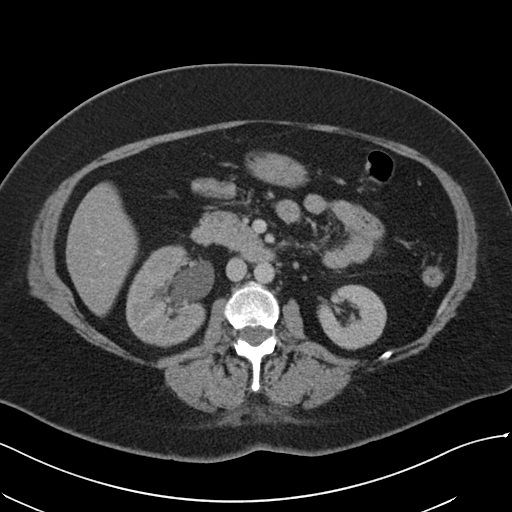
[im 57/91  bone]
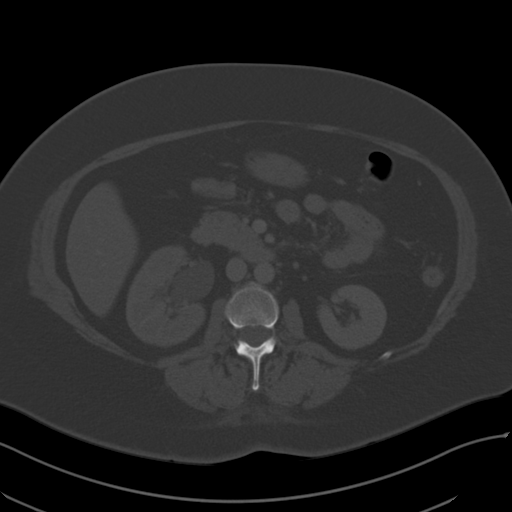
[im 67/91  soft-tissue]
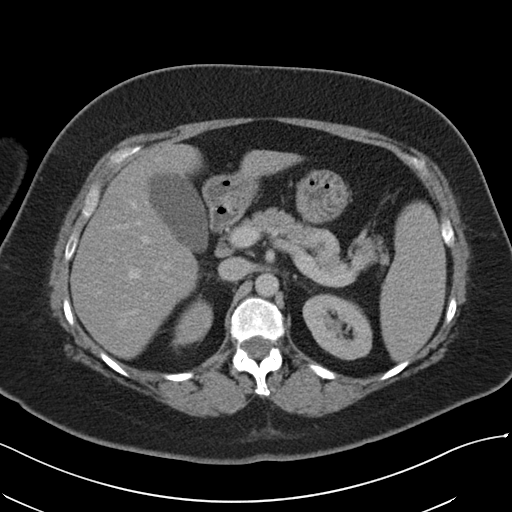
[im 72/91  soft-tissue]
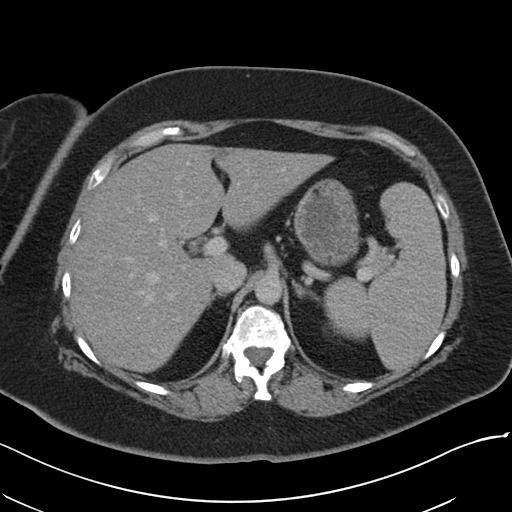
[im 76/91  soft-tissue]
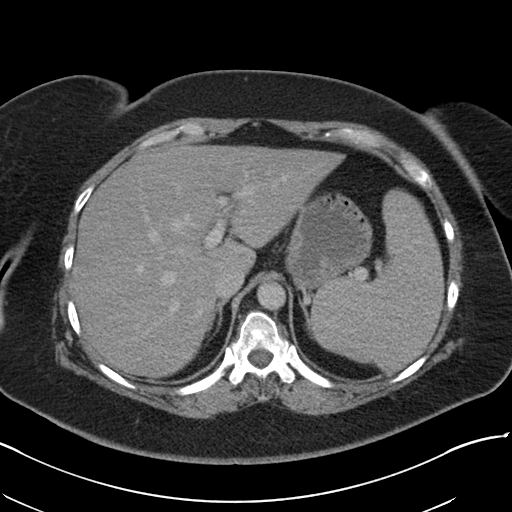
[im 86/91  soft-tissue]
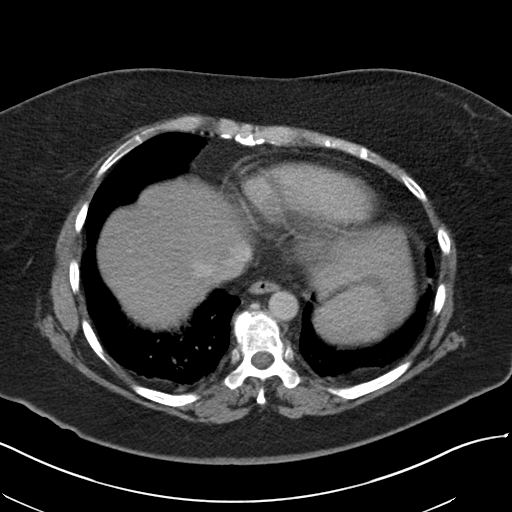

[Series 5: coronal st · coronal · 0.80mm/px · 3 of 96 slices shown]
[im 32/96  soft-tissue]
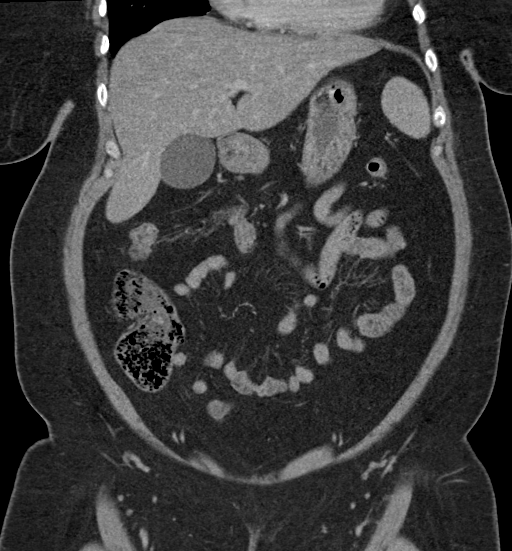
[im 43/96  soft-tissue]
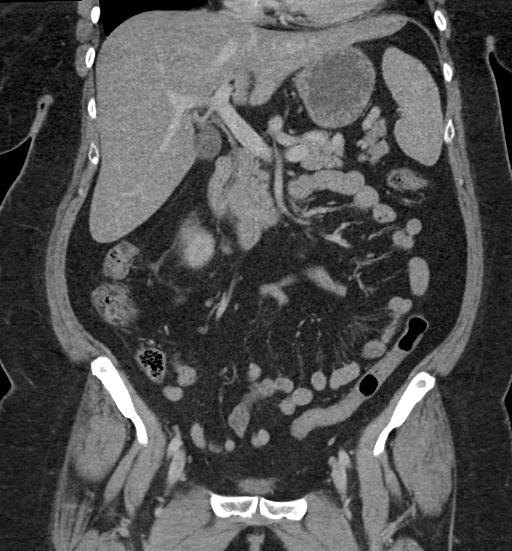
[im 53/96  soft-tissue]
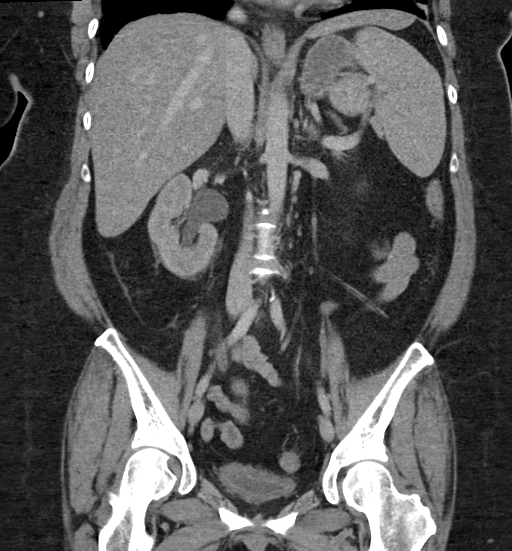

[16 of 46 positions shown; findings below may reference images not displayed]

FINDINGS: Lower chest: Minimal bibasilar atelectatic changes. The visualized
lung bases are otherwise clear.

No intra-abdominal free air or free fluid.

Hepatobiliary: Fatty infiltration of the liver. No intrahepatic
biliary ductal dilatation. The gallbladder is unremarkable.

Pancreas: Unremarkable. No pancreatic ductal dilatation or
surrounding inflammatory changes.

Spleen: Normal in size without focal abnormality.

Adrenals/Urinary Tract: The adrenal glands are unremarkable. There
is a 2 mm stone along the posterior wall of the urinary bladder in
the region of the ureterovesical junction which may represent a
recently passed right renal calculus versus a right UVJ stone. There
is mild right hydronephroureter. There is delayed enhancement and
excretion of contrast by the right kidney. The left kidney is
unremarkable. The urinary bladder is predominantly collapsed. There
is apparent diffuse thickening of the bladder wall which may be
partly related to underdistention. Cystitis is not excluded.
Correlation with urinalysis recommended.

Stomach/Bowel: There is no bowel obstruction or active inflammation.
The appendix is normal.

Vascular/Lymphatic: Mild aortoiliac atherosclerotic disease. The
abdominal aorta and IVC are otherwise unremarkable. No portal venous
gas. There is no adenopathy.

Reproductive: Hysterectomy.  No pelvic mass.

Other: Small fat containing umbilical hernia.

Musculoskeletal: No acute or significant osseous findings.
IMPRESSION: 1. A 2 mm recently passed right renal calculus within the urinary
bladder versus a right UVJ stone. There is mild right
hydronephroureter.
2. Mild fatty liver.
3. No bowel obstruction or active inflammation.  Normal appendix.

## 2019-09-21 ENCOUNTER — Encounter: Payer: Self-pay | Admitting: Family Medicine

## 2019-09-21 ENCOUNTER — Other Ambulatory Visit (INDEPENDENT_AMBULATORY_CARE_PROVIDER_SITE_OTHER): Payer: Self-pay | Admitting: Family Medicine

## 2019-09-21 DIAGNOSIS — E559 Vitamin D deficiency, unspecified: Secondary | ICD-10-CM

## 2019-09-22 ENCOUNTER — Other Ambulatory Visit: Payer: Self-pay | Admitting: Family Medicine

## 2019-09-22 MED ORDER — LOSARTAN POTASSIUM 50 MG PO TABS
50.0000 mg | ORAL_TABLET | Freq: Every day | ORAL | 3 refills | Status: DC
Start: 2019-09-22 — End: 2019-11-24

## 2019-09-27 ENCOUNTER — Other Ambulatory Visit: Payer: Self-pay | Admitting: Family Medicine

## 2019-09-27 DIAGNOSIS — E559 Vitamin D deficiency, unspecified: Secondary | ICD-10-CM

## 2019-09-27 MED ORDER — VITAMIN D (ERGOCALCIFEROL) 1.25 MG (50000 UNIT) PO CAPS: 50000.0000 [IU] | ORAL_CAPSULE | ORAL | 3 refills | Status: DC

## 2019-09-30 ENCOUNTER — Other Ambulatory Visit: Payer: Self-pay | Admitting: Family Medicine

## 2019-11-01 ENCOUNTER — Encounter: Payer: Self-pay | Admitting: Family Medicine

## 2019-11-02 NOTE — Telephone Encounter (Signed)
Noted  

## 2019-11-02 NOTE — Telephone Encounter (Signed)
Pt complaining of vibrating/bubbling under her left breast. Denies any pain, SOB, radiating arm, back, chest or jaw pain. Pt denies nausea, vomiting, diarrhea, sore throat, rash, fever, or nasal/head congestion. Pt reports this started over the weekend and is not related to exhalation or inhalation. She could not explain exactly when it occurs except that it seems to happen all the time. Pt inquired if she could take an 5m aspirin. Advised she could take an aspirin if she wanted to.  Pt thought she had scheduled an apt with you for tomorrow but she was not on the schedule so an apt was made for her at 12:15. Advised pt several times if symptom changed in any way or she developed any new symptoms to contact the office immediately. Pt verbalized understanding.

## 2019-11-03 ENCOUNTER — Ambulatory Visit: Payer: 59 | Admitting: Family Medicine

## 2019-11-03 ENCOUNTER — Other Ambulatory Visit: Payer: Self-pay

## 2019-11-03 ENCOUNTER — Encounter: Payer: Self-pay | Admitting: Family Medicine

## 2019-11-03 VITALS — BP 130/84 | HR 93 | Temp 98.3°F | Ht 63.0 in | Wt 249.4 lb

## 2019-11-03 DIAGNOSIS — R002 Palpitations: Secondary | ICD-10-CM

## 2019-11-03 DIAGNOSIS — R9431 Abnormal electrocardiogram [ECG] [EKG]: Secondary | ICD-10-CM | POA: Diagnosis not present

## 2019-11-03 DIAGNOSIS — Z636 Dependent relative needing care at home: Secondary | ICD-10-CM | POA: Diagnosis not present

## 2019-11-03 NOTE — Progress Notes (Signed)
Subjective:    Patient ID: Melinda Porter, female    DOB: 1970-10-21, 49 y.o.   MRN: [id number]  HPI Chief Complaint  Patient presents with   Follow-up    Pt c/o of strange feeling in her chest - "bubbling" and vibrating , some SOB at times but this is not new and patient now has a rash that has recently popped up on left breast/chest x  1 week, sometimes itching. Pt states that her stress/anxiety levels are higher right now.    This is a 49 yo female who presents today for above cc.  She is having a constant sensation of chest "bubbling" and vibrating, some intermittent palpitations. No pain, nothing seems to increase/ decrease pain. Occasional SOB since having covid. No cough, wheeze.    Rash of upper chest. Different than sun rash on arms. Has noticed for a couple of days. No itch.   Has been under tremendous stress. Her husband is dying in the home. They are awaiting a bed at [facility]. He is not conscious. She is unable to move him, does not have daily help in the home. Home hospice periodically.   Review of Systems Per HPI    Objective:   Physical Exam Vitals reviewed.  Constitutional:      General: She is not in acute distress.    Appearance: Normal appearance. She is obese. She is not ill-appearing, toxic-appearing or diaphoretic.  HENT:     Head: Normocephalic and atraumatic.  Eyes:     Conjunctiva/sclera: Conjunctivae normal.  Cardiovascular:     Rate and Rhythm: Normal rate. Rhythm irregular.     Heart sounds: Normal heart sounds.     Comments: Occasional early beats.  Pulmonary:     Effort: Pulmonary effort is normal.     Breath sounds: Normal breath sounds.  Musculoskeletal:     Cervical back: Normal range of motion and neck supple.  Skin:    General: Skin is warm and dry.     Findings: Rash present.  Neurological:     Mental Status: She is alert.      BP 130/84 (BP Location: Left Wrist, Patient Position: Sitting, Cuff Size: Normal)    Pulse 93     Temp 98.3 F (36.8 C) (Temporal)    Ht 5' 3"  (1.6 m)    Wt 249 lb 6.4 oz (113.1 kg)    LMP 06/23/2009    SpO2 98%    BMI 44.18 kg/m  Wt Readings from Last 3 Encounters:  11/03/19 249 lb 6.4 oz (113.1 kg)  08/12/19 237 lb (107.5 kg)  07/26/19 237 lb (107.5 kg)   EKG- Sinus Rhythm, rate 87, no ectopic beats, PR168, QT 358. No changes from prior tracings. Negative precordial T waves.      Assessment & Plan:  1. Palpitations - given her history of autoimmune disease (lupus), will get Echo to look at heart structure - she was instructed to notify office/ ER/ UC if new or worsening symptoms occur - EKG 12-Lead - ECHOCARDIOGRAM COMPLETE; Future  2. Abnormal EKG - stable, chronically inverted T waves - ECHOCARDIOGRAM COMPLETE; Future  3. Caregiver stress - offered support during this difficult time  This visit occurred during the SARS-CoV-2 public health emergency.  Safety protocols were in place, including screening questions prior to the visit, additional usage of staff PPE, and extensive cleaning of exam room while observing appropriate contact time as indicated for disinfecting solutions.      Clarene Reamer, FNP-BC  [facility], [facility]  11/04/2019 8:17 AM

## 2019-11-03 NOTE — Patient Instructions (Signed)
Good to see you today  You will get a call about the echocardiogram, let me know if you are having any worsening or new symptoms  Try steroid cream on rash, let me know if it doesn't get better or gets worse  Hang in there!

## 2019-11-10 ENCOUNTER — Other Ambulatory Visit: Payer: Self-pay | Admitting: Family Medicine

## 2019-11-10 DIAGNOSIS — R9431 Abnormal electrocardiogram [ECG] [EKG]: Secondary | ICD-10-CM

## 2019-11-10 DIAGNOSIS — R0600 Dyspnea, unspecified: Secondary | ICD-10-CM

## 2019-11-12 ENCOUNTER — Other Ambulatory Visit: Payer: 59

## 2019-11-16 ENCOUNTER — Other Ambulatory Visit: Payer: Self-pay | Admitting: Family Medicine

## 2019-11-16 DIAGNOSIS — Z1231 Encounter for screening mammogram for malignant neoplasm of breast: Secondary | ICD-10-CM

## 2019-11-19 ENCOUNTER — Other Ambulatory Visit: Payer: Self-pay | Admitting: Family Medicine

## 2019-11-19 DIAGNOSIS — Z1231 Encounter for screening mammogram for malignant neoplasm of breast: Secondary | ICD-10-CM

## 2019-11-22 ENCOUNTER — Other Ambulatory Visit: Payer: Self-pay | Admitting: Family Medicine

## 2019-11-22 DIAGNOSIS — F419 Anxiety disorder, unspecified: Secondary | ICD-10-CM

## 2019-11-22 NOTE — Telephone Encounter (Signed)
Last refilled 08/13/19 for #20 with 0 refills.  Last OV was 11/03/19 and has no upcoming appts. Ok to refill?

## 2019-11-23 ENCOUNTER — Encounter: Payer: Self-pay | Admitting: Family Medicine

## 2019-11-24 ENCOUNTER — Other Ambulatory Visit: Payer: Self-pay | Admitting: Family Medicine

## 2019-11-24 MED ORDER — LOSARTAN POTASSIUM 100 MG PO TABS
100.0000 mg | ORAL_TABLET | Freq: Every day | ORAL | 5 refills | Status: DC
Start: 2019-11-24 — End: 2020-11-13

## 2019-12-09 ENCOUNTER — Other Ambulatory Visit: Payer: Self-pay

## 2019-12-15 ENCOUNTER — Other Ambulatory Visit: Payer: Self-pay | Admitting: Family Medicine

## 2019-12-15 ENCOUNTER — Encounter: Payer: Self-pay | Admitting: Family Medicine

## 2019-12-15 DIAGNOSIS — R110 Nausea: Secondary | ICD-10-CM

## 2019-12-15 MED ORDER — ONDANSETRON 8 MG PO TBDP: 8.0000 mg | ORAL_TABLET | Freq: Three times a day (TID) | ORAL | 0 refills | Status: DC | PRN

## 2019-12-16 ENCOUNTER — Other Ambulatory Visit: Payer: Self-pay

## 2019-12-16 ENCOUNTER — Other Ambulatory Visit: Payer: Self-pay | Admitting: Family Medicine

## 2019-12-29 ENCOUNTER — Other Ambulatory Visit: Payer: Self-pay

## 2019-12-29 ENCOUNTER — Ambulatory Visit (INDEPENDENT_AMBULATORY_CARE_PROVIDER_SITE_OTHER): Payer: 59 | Admitting: Family Medicine

## 2019-12-29 ENCOUNTER — Encounter: Payer: Self-pay | Admitting: Family Medicine

## 2019-12-29 VITALS — BP 126/78 | HR 90 | Temp 97.8°F | Ht 64.0 in | Wt 245.5 lb

## 2019-12-29 DIAGNOSIS — Z8719 Personal history of other diseases of the digestive system: Secondary | ICD-10-CM | POA: Diagnosis not present

## 2019-12-29 DIAGNOSIS — R197 Diarrhea, unspecified: Secondary | ICD-10-CM

## 2019-12-29 DIAGNOSIS — M329 Systemic lupus erythematosus, unspecified: Secondary | ICD-10-CM | POA: Insufficient documentation

## 2019-12-29 DIAGNOSIS — M3219 Other organ or system involvement in systemic lupus erythematosus: Secondary | ICD-10-CM

## 2019-12-29 LAB — CBC WITH DIFFERENTIAL/PLATELET
Basophils Absolute: 0.1 10*3/uL (ref 0.0–0.1)
Basophils Relative: 2 % (ref 0.0–3.0)
Eosinophils Absolute: 0.2 10*3/uL (ref 0.0–0.7)
Eosinophils Relative: 3 % (ref 0.0–5.0)
HCT: 34.3 % — ABNORMAL LOW (ref 36.0–46.0)
Hemoglobin: 10.6 g/dL — ABNORMAL LOW (ref 12.0–15.0)
Lymphocytes Relative: 21.6 % (ref 12.0–46.0)
Lymphs Abs: 1.4 10*3/uL (ref 0.7–4.0)
MCHC: 30.9 g/dL (ref 30.0–36.0)
MCV: 71.5 fl — ABNORMAL LOW (ref 78.0–100.0)
Monocytes Absolute: 0.4 10*3/uL (ref 0.1–1.0)
Monocytes Relative: 6.7 % (ref 3.0–12.0)
Neutro Abs: 4.2 10*3/uL (ref 1.4–7.7)
Neutrophils Relative %: 66.7 % (ref 43.0–77.0)
Platelets: 169 10*3/uL (ref 150.0–400.0)
RBC: 4.8 Mil/uL (ref 3.87–5.11)
RDW: 18.1 % — ABNORMAL HIGH (ref 11.5–15.5)
WBC: 6.3 10*3/uL (ref 4.0–10.5)

## 2019-12-29 LAB — COMPREHENSIVE METABOLIC PANEL
ALT: 8 U/L (ref 0–35)
AST: 12 U/L (ref 0–37)
Albumin: 4.2 g/dL (ref 3.5–5.2)
Alkaline Phosphatase: 63 U/L (ref 39–117)
BUN: 11 mg/dL (ref 6–23)
CO2: 23 mEq/L (ref 19–32)
Calcium: 9 mg/dL (ref 8.4–10.5)
Chloride: 108 mEq/L (ref 96–112)
Creatinine, Ser: 0.9 mg/dL (ref 0.40–1.20)
GFR: 66.98 mL/min (ref >60.00)
Glucose, Bld: 115 mg/dL — ABNORMAL HIGH (ref 70–99)
Potassium: 3.1 mEq/L — ABNORMAL LOW (ref 3.5–5.1)
Sodium: 141 mEq/L (ref 135–145)
Total Bilirubin: 0.4 mg/dL (ref 0.2–1.2)
Total Protein: 6.7 g/dL (ref 6.0–8.3)

## 2019-12-29 LAB — SEDIMENTATION RATE: Sed Rate: 21 mm/hr — ABNORMAL HIGH (ref 0–20)

## 2019-12-29 NOTE — Progress Notes (Signed)
Subjective:    Patient ID: Melinda Porter, female    DOB: 1971/01/24, 49 y.o.   MRN: [id number]  HPI Chief Complaint  Patient presents with  . Abdominal Pain    x1 wk lower abd, diarrhea, denies N,V. Exposed to covid 12/10/2019.Marland KitchenMarland Kitchenpt states she was negative.  [name]   Patient with 3 weeks of diarrhea. Started with body aches, fever, chills- all resolved except diarrhea. Bowel movements x 6-7 yesterday. Immodium does not help. Started probiotic 4-5 days. ? Related to increased does of losartan. Has decreased dose and has not noticed significant improvement. Abdominal pain only prior to bowel movement. Intermittent bloating.  No recent antibiotics. Has history of ischemic colitis. Has appointment with GI next month (soonest she could get in).  No recent fever, chills, nausea, vomiting.  Per patient, she has lost 15 pounds. Has been able to eat noodles, chicken, potatoes, broccoli.    Review of Systems Per HPI    Objective:   Physical Exam Vitals reviewed.  Constitutional:      General: She is not in acute distress.    Appearance: She is well-developed. She is obese. She is not ill-appearing, toxic-appearing or diaphoretic.  HENT:     Head: Normocephalic and atraumatic.  Eyes:     Conjunctiva/sclera: Conjunctivae normal.  Cardiovascular:     Rate and Rhythm: Normal rate and regular rhythm.     Heart sounds: Normal heart sounds.  Pulmonary:     Effort: Pulmonary effort is normal.     Breath sounds: Normal breath sounds.  Abdominal:     General: Abdomen is flat. Bowel sounds are normal. There is no distension.     Palpations: Abdomen is soft. There is no mass.     Tenderness: There is no abdominal tenderness. There is no guarding or rebound.     Hernia: No hernia is present.  Skin:    General: Skin is warm and dry.  Neurological:     Mental Status: She is alert and oriented to person, place, and time.  Psychiatric:        Mood and Affect: Mood normal.        Thought  Content: Thought content normal.        Judgment: Judgment normal.       BP 126/78   Pulse 90   Temp 97.8 F (36.6 C) (Temporal)   Ht 5' 4"  (1.626 m)   Wt 245 lb 8 oz (111.4 kg)   LMP 06/23/2009   SpO2 97%   BMI 42.14 kg/m  Wt Readings from Last 3 Encounters:  12/29/19 245 lb 8 oz (111.4 kg)  11/03/19 249 lb 6.4 oz (113.1 kg)  08/12/19 237 lb (107.5 kg)       Assessment & Plan:  1. Diarrhea, unspecified type - continue bland diet, adequate hydration - CBC with Differential - Sedimentation rate - Comprehensive metabolic panel - Gastrointestinal Pathogen Panel PCR; Future - CT Abdomen Pelvis W Contrast; Future  2. Other systemic lupus erythematosus with other organ involvement ([place]) - CBC with Differential - Sedimentation rate - Comprehensive metabolic panel  3. History of ischemic colitis - current symptoms not consistent with flare  This visit occurred during the SARS-CoV-2 public health emergency.  Safety protocols were in place, including screening questions prior to the visit, additional usage of staff PPE, and extensive cleaning of exam room while observing appropriate contact time as indicated for disinfecting solutions.      Clarene Reamer, FNP-BC  [facility]  at [facility]  12/29/2019 11:02 AM

## 2019-12-29 NOTE — Patient Instructions (Signed)
Good to see you today  Be sure to get a stool collection container at the lab

## 2019-12-30 ENCOUNTER — Telehealth: Payer: Self-pay

## 2019-12-30 ENCOUNTER — Encounter: Payer: Self-pay | Admitting: Family Medicine

## 2019-12-30 NOTE — Telephone Encounter (Signed)
Pt scheduled to see Dr. Henrene Pastor 01/14/20@3 :40pm. Left message for pt to call back.

## 2019-12-30 NOTE — Telephone Encounter (Signed)
-----   Message from Irene Shipper, MD sent at 12/30/2019  4:10 PM EDT ----- Hi Debbie,Nothing to add at this point.  If the CT shows anything that you have questions about, feel free to reach out.  Otherwise, I can work her onto my schedule Friday, October 1 at 3:40 PM.  Hopefully this works for her.  I will let my nurse, Vaughan Basta, know.  Thanks.  Best to the invalid.John ----- Message ----- From: Elby Beck, FNP Sent: 12/30/2019   3:25 PM EDT To: Irene Shipper, MD  [place] you are doing well. Good to see you in the office last week with my invalid!  I am PCP for Melinda Porter. You saw her in the past for diarrhea and did a colonoscopy. She had ischemic colitis in 2019.  She has had diarrhea for 3 weeks following a viral illness (highly suspicious for Covid but tested negative).  She has an appointment with [facility] October 12 (that was the earliest she could get).   I saw her in the office yesterday, her hgb/hct have dropped a little, sed rate up (she has lupus), I am waiting on stool studies and I ordered a ct abdomen/ pelvis. Any recommendations? Would you be able to get her in sooner?   Thanks so much for your help! Jackelyn Poling

## 2019-12-30 NOTE — Telephone Encounter (Signed)
Noted  

## 2019-12-31 ENCOUNTER — Other Ambulatory Visit: Payer: 59

## 2019-12-31 DIAGNOSIS — R197 Diarrhea, unspecified: Secondary | ICD-10-CM

## 2020-01-04 LAB — GASTROINTESTINAL PATHOGEN PANEL PCR
C. difficile Tox A/B, PCR: NOT DETECTED
Campylobacter, PCR: NOT DETECTED
Cryptosporidium, PCR: NOT DETECTED
E coli (ETEC) LT/ST PCR: NOT DETECTED
E coli (STEC) stx1/stx2, PCR: NOT DETECTED
E coli 0157, PCR: NOT DETECTED
Giardia lamblia, PCR: NOT DETECTED
Norovirus, PCR: NOT DETECTED
Rotavirus A, PCR: NOT DETECTED
Salmonella, PCR: NOT DETECTED
Shigella, PCR: NOT DETECTED

## 2020-01-10 ENCOUNTER — Telehealth: Payer: Self-pay | Admitting: Family Medicine

## 2020-01-10 ENCOUNTER — Ambulatory Visit
Admission: RE | Admit: 2020-01-10 | Discharge: 2020-01-10 | Disposition: A | Discharge status: Home / Self Care | Payer: 59 | Source: Ambulatory Visit | Attending: Family Medicine | Admitting: Family Medicine

## 2020-01-10 DIAGNOSIS — R197 Diarrhea, unspecified: Secondary | ICD-10-CM

## 2020-01-10 MED ORDER — IOPAMIDOL (ISOVUE-300) INJECTION 61%
125.0000 mL | Freq: Once | INTRAVENOUS | Status: AC | PRN
  Administered 2020-01-10: 125 mL via INTRAVENOUS

## 2020-01-10 NOTE — Telephone Encounter (Signed)
Please call patient and tell her that I spoke with one of the radiologists who has ok'd her having the test without contrast. He was going to call the imaging center to let them know.

## 2020-01-10 NOTE — Telephone Encounter (Signed)
Pt has called and says she cannot drink the contrast for CT scan ordered.  She called the people doing the CT scan and they said she either has to drink all of the contrast or that Jackelyn Poling has to order a different type of test.  Pt's appt is at noon today.  Please advise ASAP.  Pt requests c/b [phone] Thank you!

## 2020-01-10 NOTE — Telephone Encounter (Signed)
Called pt and informed her of msg from Tor Netters, .  Pt verbalized understanding,

## 2020-01-10 NOTE — Telephone Encounter (Signed)
Called pt back, asked if she took her anti nausea medication?  Pt stated she took it first thing this morning, she stated that she woke up with dry heaving. States she cannot drink the contrast and have had a ct scan in the hosp w/o drinking the contrast.  I will talk to provider and call pt back.

## 2020-01-11 ENCOUNTER — Encounter: Payer: Self-pay | Admitting: Family Medicine

## 2020-01-14 ENCOUNTER — Ambulatory Visit (INDEPENDENT_AMBULATORY_CARE_PROVIDER_SITE_OTHER): Payer: 59 | Admitting: Internal Medicine

## 2020-01-14 ENCOUNTER — Other Ambulatory Visit (INDEPENDENT_AMBULATORY_CARE_PROVIDER_SITE_OTHER): Payer: 59

## 2020-01-14 ENCOUNTER — Encounter: Payer: Self-pay | Admitting: Internal Medicine

## 2020-01-14 VITALS — BP 134/88 | HR 82 | Ht 64.0 in | Wt 241.0 lb

## 2020-01-14 DIAGNOSIS — R1030 Lower abdominal pain, unspecified: Secondary | ICD-10-CM | POA: Diagnosis not present

## 2020-01-14 DIAGNOSIS — K219 Gastro-esophageal reflux disease without esophagitis: Secondary | ICD-10-CM

## 2020-01-14 DIAGNOSIS — D500 Iron deficiency anemia secondary to blood loss (chronic): Secondary | ICD-10-CM

## 2020-01-14 DIAGNOSIS — R197 Diarrhea, unspecified: Secondary | ICD-10-CM

## 2020-01-14 DIAGNOSIS — R935 Abnormal findings on diagnostic imaging of other abdominal regions, including retroperitoneum: Secondary | ICD-10-CM | POA: Diagnosis not present

## 2020-01-14 LAB — IGA: IgA: 78 mg/dL (ref 68–378)

## 2020-01-14 NOTE — Patient Instructions (Signed)
Your provider has requested that you go to the basement level for lab work before leaving today. Press "B" on the elevator. The lab is located at the first door on the left as you exit the elevator.  You have been scheduled for an endoscopy and colonoscopy. Please follow the written instructions given to you at your visit today. Please pick up your prep supplies at the pharmacy within the next 1-3 days. If you use inhalers (even only as needed), please bring them with you on the day of your procedure.

## 2020-01-14 NOTE — Progress Notes (Signed)
HISTORY OF PRESENT ILLNESS:  Melinda Porter is a 49 y.o. female with multiple medical problems including lupus, hypertension, hyperlipidemia, obesity, fibromyalgia, arthritis, anxiety, and a history of ischemic colitis.  Patient presents today with chief complaint of constant diarrhea of 5 weeks duration.  She reports 6-8 loose bowel movements daily.  Mostly watery.  Occasional mucus and blood.  Abdominal cramping.  No fevers recently.  Low-dose Imodium not helpful.  Taking Zofran for nausea.  Pantoprazole for GERD.  Has been on Azulfidine for over a year (placed on by her rheumatologist for joint aches).  She last underwent colonoscopy for complaints of diarrhea after hospitalization with ischemic colitis in April 2019.  Examination at that time was essentially normal.  Scarring in the colon noted.  Random colon biopsies normal.  The ileum was normal.  She was prescribed Lomotil.  She tells me that her diarrhea resolved until recently.  She will have occasional problems with diarrhea and abdominal cramping, but nothing like she is currently experiencing.  Should be noted that patient underwent evaluation including blood work.  She is found to be anemic with a hemoglobin of 10.6.  MCV 71.5.  Low ferritin level of 7, last checked.  GI pathogen panel was negative.  CT scan of the abdomen and pelvis with contrast revealed liquid stool throughout the colon consistent with diarrheal process.  In addition mild wall thickening of the sigmoid colon with mild pericolonic fat stranding consistent with colitis.  Incidental hepatic steatosis and splenomegaly.  Patient has completed her Covid vaccination series  REVIEW OF SYSTEMS:  All non-GI ROS negative as otherwise stated in the HPI except for arthritis, headaches, fatigue  Past Medical History:  Diagnosis Date  . Anxiety   . Anxiety   . Arthritis   . Back pain   . Bilateral swelling of feet   . Depression   . Endometriosis   . Fatty liver   . Fibromyalgia    . Hiatal hernia   . Hyperlipemia   . Hypertension   . IBS (irritable bowel syndrome)   . Joint pain   . Lupus ([place])   . PTSD (post-traumatic stress disorder)   . Vitamin D deficiency     Past Surgical History:  Procedure Laterality Date  . CESAREAN SECTION  1997  . FLEXIBLE SIGMOIDOSCOPY N/A 01/06/2017   Procedure: FLEXIBLE SIGMOIDOSCOPY;  Surgeon: Yetta Flock, MD;  Location: [facility];  Service: Gastroenterology;  Laterality: N/A;  . OOPHORECTOMY Bilateral   . PARTIAL HYSTERECTOMY  07/2009  . TEMPOROMANDIBULAR JOINT SURGERY  2009   right side  . TONSILLECTOMY  2009    Social History [name]  reports that she quit smoking about 20 years ago. Her smoking use included cigarettes. She has never used smokeless tobacco. She reports that she does not drink alcohol and does not use drugs.  family history includes Alcoholism in her father; Colon cancer in her maternal uncle; Colon polyps in her maternal grandmother; Diabetes in her maternal grandfather; Heart disease in her maternal grandfather; Hypertension in her maternal grandfather; Liver disease in her father; Lung cancer in her maternal grandmother; Thyroid disease in her mother.  Allergies  Allergen Reactions  . Lyrica [Pregabalin] Shortness Of Breath  . Ciprofloxacin Nausea Only  . Gabapentin     Stroke like symptoms  . Nsaids Other (See Comments)    Liver problems- can't take tylenol, ibuprofen etc.  . Tolmetin     Other reaction(s): Other (See Comments) Liver problems- can't take  tylenol, ibuprofen etc.  . Codeine Rash  . Humira [Adalimumab] Rash    Worsening of Lupus rash.  . Morphine And Related Rash    "Wired up"  . Penicillins Rash    Has patient had a PCN reaction causing immediate rash, facial/tongue/throat swelling, SOB or lightheadedness with hypotension: NO Has patient had a PCN reaction causing severe rash involving mucus membranes or skin necrosis: NO Has patient had a PCN reaction that  required hospitalization NO Has patient had a PCN reaction occurring within the last 10 years: NO If all of the above answers are "NO", then may proceed with Cephalosporin use.       PHYSICAL EXAMINATION: Vital signs: BP 134/88   Pulse 82   Ht 5' 4"  (1.626 m)   Wt 241 lb (109.3 kg)   LMP 06/23/2009   BMI 41.37 kg/m   Constitutional: Pleasant, generally well-appearing, no acute distress Psychiatric: alert and oriented x3, cooperative Eyes: extraocular movements intact, anicteric, conjunctiva pink Mouth: oral pharynx moist, no lesions Neck: supple no lymphadenopathy Cardiovascular: heart regular rate and rhythm, no murmur Lungs: clear to auscultation bilaterally Abdomen: soft, obese, nontender, nondistended, no obvious ascites, no peritoneal signs, normal bowel sounds, no organomegaly Rectal: Deferred until colonoscopy Extremities: no clubbing, cyanosis, or lower extremity edema bilaterally Skin: no lesions on visible extremities.  Tattoos left lower extremity right upper extremity Neuro: No focal deficits. No asterixis.    ASSESSMENT:  1.  Persistent diarrhea with abdominal cramping, mucus, and minor bleeding.  CT scan showing mild colitis.  Rule out IBD. 2.  Abnormal CT scan as described 3.  Iron deficiency anemia 4.  GERD.  On PPI 5.  Multiple medical problems 6.  Obesity   PLAN:  1.  Celiac testing with tissue transglutaminase IgA and serum IgA level 2.  Upper endoscopy with biopsies.The nature of the procedure, as well as the risks, benefits, and alternatives were carefully and thoroughly reviewed with the patient. Ample time for discussion and questions allowed. The patient understood, was satisfied, and agreed to proceed. 3.  Colonoscopy with biopsies.The nature of the procedure, as well as the risks, benefits, and alternatives were carefully and thoroughly reviewed with the patient. Ample time for discussion and questions allowed. The patient understood, was  satisfied, and agreed to proceed. 4.  Okay to increase Imodium to 1 or 2 3 times daily as needed 40 minutes (see the patient, reviewing x-rays, laboratories, and prior endoscopy reports with pathology.  Obtaining comprehensive history and performing comprehensive physical exam.  Counseling the patient regarding multiple above listed issues.  Ordering advanced laboratories as well as advanced endoscopic procedures.  Documenting clinical information in the health record

## 2020-01-17 ENCOUNTER — Encounter: Payer: Self-pay | Admitting: Internal Medicine

## 2020-01-17 ENCOUNTER — Ambulatory Visit (AMBULATORY_SURGERY_CENTER): Payer: 59 | Admitting: Internal Medicine

## 2020-01-17 ENCOUNTER — Other Ambulatory Visit: Payer: Self-pay

## 2020-01-17 VITALS — BP 136/70 | HR 61 | Temp 97.1°F | Resp 21 | Ht 64.0 in | Wt 241.0 lb

## 2020-01-17 DIAGNOSIS — R197 Diarrhea, unspecified: Secondary | ICD-10-CM

## 2020-01-17 DIAGNOSIS — R1030 Lower abdominal pain, unspecified: Secondary | ICD-10-CM

## 2020-01-17 DIAGNOSIS — D500 Iron deficiency anemia secondary to blood loss (chronic): Secondary | ICD-10-CM | POA: Diagnosis not present

## 2020-01-17 DIAGNOSIS — K51514 Left sided colitis with abscess: Secondary | ICD-10-CM | POA: Diagnosis not present

## 2020-01-17 DIAGNOSIS — K529 Noninfective gastroenteritis and colitis, unspecified: Secondary | ICD-10-CM | POA: Diagnosis not present

## 2020-01-17 DIAGNOSIS — K219 Gastro-esophageal reflux disease without esophagitis: Secondary | ICD-10-CM

## 2020-01-17 DIAGNOSIS — K5289 Other specified noninfective gastroenteritis and colitis: Secondary | ICD-10-CM | POA: Diagnosis present

## 2020-01-17 LAB — TISSUE TRANSGLUTAMINASE, IGA: (tTG) Ab, IgA: <1 U/mL

## 2020-01-17 MED ORDER — SODIUM CHLORIDE 0.9 % IV SOLN: 500.0000 mL | Freq: Once | INTRAVENOUS | Status: DC

## 2020-01-17 NOTE — Progress Notes (Signed)
PT taken to PACU. Monitors in place. VSS. Report given to RN. 

## 2020-01-17 NOTE — Op Note (Signed)
[place] Patient Name: Melinda Porter Procedure Date: 01/17/2020 9:46 AM MRN: [id number] Endoscopist: Docia Chuck. Henrene Pastor , MD Age: 49 Referring MD:  Date of Birth: 03/10/1971 Gender: Female Account #: [id number] Procedure:                Colonoscopy with biopsies Indications:              Abdominal pain, Clinically significant diarrhea of                            unexplained origin, Abnormal CT of the GI tract Medicines:                Monitored Anesthesia Care Procedure:                Pre-Anesthesia Assessment:                           - Prior to the procedure, a History and Physical                            was performed, and patient medications and                            allergies were reviewed. The patient's tolerance of                            previous anesthesia was also reviewed. The risks                            and benefits of the procedure and the sedation                            options and risks were discussed with the patient.                            All questions were answered, and informed consent                            was obtained. Prior Anticoagulants: The patient has                            taken no previous anticoagulant or antiplatelet                            agents. ASA Grade Assessment: II - A patient with                            mild systemic disease. After reviewing the risks                            and benefits, the patient was deemed in                            satisfactory condition to undergo the procedure.  After obtaining informed consent, the colonoscope                            was passed under direct vision. Throughout the                            procedure, the patient's blood pressure, pulse, and                            oxygen saturations were monitored continuously. The                            Colonoscope was introduced through the anus and                             advanced to the the cecum, identified by                            appendiceal orifice and ileocecal valve. The                            ileocecal valve, appendiceal orifice, and rectum                            were photographed. The quality of the bowel                            preparation was excellent. The colonoscopy was                            performed without difficulty. The patient tolerated                            the procedure well. The bowel preparation used was                            SUPREP via split dose instruction. Scope In: 9:56:30 AM Scope Out: 10:14:52 AM Scope Withdrawal Time: 0 hours 11 minutes 58 seconds  Total Procedure Duration: 0 hours 18 minutes 22 seconds  Findings:                 The terminal ileum appeared normal.                           The colon revealed patchy erythema throughout.                            Scarring in the region of the hepatic flexure,                            again noted. Entire examined colon appeared                            otherwise normal on direct and retroflexion views.  Biopsies were taken with a cold forceps for                            histology from the right colon, transverse colon,                            and left colon. Complications:            No immediate complications. Estimated blood loss:                            None. Estimated Blood Loss:     Estimated blood loss: none. Impression:               - The examined portion of the ileum was normal.                           - Patchy mild erythema throughout. Biopsies taken.                            Rule out colitis                           - The entire examined colon is otherwise normal on                            direct and retroflexion views. Recommendation:           - Repeat colonoscopy in 5 years for surveillance                            (history of sessile serrated polyp 2019).                            - Patient has a contact number available for                            emergencies. The signs and symptoms of potential                            delayed complications were discussed with the                            patient. Return to normal activities tomorrow.                            Written discharge instructions were provided to the                            patient.                           - Resume previous diet.                           - Continue present medications.                           -  Await pathology results.                           - EGD today. Please see report Docia Chuck. Henrene Pastor, MD 01/17/2020 10:22:51 AM This report has been signed electronically.

## 2020-01-17 NOTE — Op Note (Signed)
[place] Patient Name: Francenia Porter Procedure Date: 01/17/2020 9:45 AM MRN: [id number] Endoscopist: Docia Chuck. Henrene Pastor , MD Age: 49 Referring MD:  Date of Birth: [birth year] Gender: Female Account #: [id number] Procedure:                Upper GI endoscopy with biopsies Indications:              Iron deficiency anemia, Diarrhea Medicines:                Monitored Anesthesia Care Procedure:                Pre-Anesthesia Assessment:                           - Prior to the procedure, a History and Physical                            was performed, and patient medications and                            allergies were reviewed. The patient's tolerance of                            previous anesthesia was also reviewed. The risks                            and benefits of the procedure and the sedation                            options and risks were discussed with the patient.                            All questions were answered, and informed consent                            was obtained. Prior Anticoagulants: The patient has                            taken no previous anticoagulant or antiplatelet                            agents. ASA Grade Assessment: II - A patient with                            mild systemic disease. After reviewing the risks                            and benefits, the patient was deemed in                            satisfactory condition to undergo the procedure.                           After obtaining informed consent, the endoscope was  passed under direct vision. Throughout the                            procedure, the patient's blood pressure, pulse, and                            oxygen saturations were monitored continuously. The                            Endoscope was introduced through the mouth, and                            advanced to the second part of duodenum. The upper                            GI  endoscopy was accomplished without difficulty.                            The patient tolerated the procedure well. Scope In: Scope Out: Findings:                 The esophagus was normal.                           The stomach revealed a few small inflammatory                            nodules in the antrum. The stomach was otherwise                            normal.                           The examined duodenum was normal. Biopsies for                            histology were taken with a cold forceps for                            evaluation of celiac disease.                           The cardia and gastric fundus were normal on                            retroflexion. Complications:            No immediate complications. Estimated Blood Loss:     Estimated blood loss: none. Impression:               1. Essentially normal EGD                           2. Status post duodenal biopsies. Recommendation:           - Patient has a contact number available for  emergencies. The signs and symptoms of potential                            delayed complications were discussed with the                            patient. Return to normal activities tomorrow.                            Written discharge instructions were provided to the                            patient.                           - Resume previous diet.                           - Continue present medications.                           - Await pathology results.                           - We will contact you with the results, and                            additional recommendations if necessary, when                            available. Docia Chuck. Henrene Pastor, MD 01/17/2020 10:31:17 AM This report has been signed electronically.

## 2020-01-17 NOTE — Progress Notes (Signed)
Called to room to assist during endoscopic procedure.  Patient ID and intended procedure confirmed with present staff. Received instructions for my participation in the procedure from the performing physician.  

## 2020-01-17 NOTE — Patient Instructions (Signed)
YOU HAD AN ENDOSCOPIC PROCEDURE TODAY AT THE [facility]:   Refer to the procedure report that was given to you for any specific questions about what was found during the examination.  If the procedure report does not answer your questions, please call your gastroenterologist to clarify.  If you requested that your care partner not be given the details of your procedure findings, then the procedure report has been included in a sealed envelope for you to review at your convenience later.  YOU SHOULD EXPECT: Some feelings of bloating in the abdomen. Passage of more gas than usual.  Walking can help get rid of the air that was put into your GI tract during the procedure and reduce the bloating. If you had a lower endoscopy (such as a colonoscopy or flexible sigmoidoscopy) you may notice spotting of blood in your stool or on the toilet paper. If you underwent a bowel prep for your procedure, you may not have a normal bowel movement for a few days.  Please Note:  You might notice some irritation and congestion in your nose or some drainage.  This is from the oxygen used during your procedure.  There is no need for concern and it should clear up in a day or so.  SYMPTOMS TO REPORT IMMEDIATELY:   Following lower endoscopy (colonoscopy or flexible sigmoidoscopy):  Excessive amounts of blood in the stool  Significant tenderness or worsening of abdominal pains  Swelling of the abdomen that is new, acute  Fever of 100F or higher   Following upper endoscopy (EGD)  Vomiting of blood or coffee ground material  New chest pain or pain under the shoulder blades  Painful or persistently difficult swallowing  New shortness of breath  Fever of 100F or higher  Black, tarry-looking stools  For urgent or emergent issues, a gastroenterologist can be reached at any hour by calling [phone]. Do not use MyChart messaging for urgent concerns.    DIET:  We do recommend a small meal at first, but  then you may proceed to your regular diet.  Drink plenty of fluids but you should avoid alcoholic beverages for 24 hours.  ACTIVITY:  You should plan to take it easy for the rest of today and you should NOT DRIVE or use heavy machinery until tomorrow (because of the sedation medicines used during the test).    FOLLOW UP: Our staff will call the number listed on your records 48-72 hours following your procedure to check on you and address any questions or concerns that you may have regarding the information given to you following your procedure. If we do not reach you, we will leave a message.  We will attempt to reach you two times.  During this call, we will ask if you have developed any symptoms of COVID 19. If you develop any symptoms (ie: fever, flu-like symptoms, shortness of breath, cough etc.) before then, please call [phone].  If you test positive for Covid 19 in the 2 weeks post procedure, please call and report this information to us.    If any biopsies were taken you will be contacted by phone or by letter within the next 1-3 weeks.  Please call us at [phone] if you have not heard about the biopsies in 3 weeks.    SIGNATURES/CONFIDENTIALITY: You and/or your care partner have signed paperwork which will be entered into your electronic medical record.  These signatures attest to the fact that that the information above on   your After Visit Summary has been reviewed and is understood.  Full responsibility of the confidentiality of this discharge information lies with you and/or your care-partner. 

## 2020-01-19 ENCOUNTER — Telehealth: Payer: Self-pay

## 2020-01-19 NOTE — Telephone Encounter (Signed)
°  Follow up Call-  Call back number 01/17/2020 07/28/2017  Post procedure Call Back phone  # [phone]  Permission to leave phone message Yes Yes  Some recent data might be hidden     Patient questions:  Do you have a fever, pain , or abdominal swelling? No. Pain Score  0 *  Have you tolerated food without any problems? Yes.    Have you been able to return to your normal activities? Yes.    Do you have any questions about your discharge instructions: Diet   No. Medications  No. Follow up visit  No.  Do you have questions or concerns about your Care? No.  Actions: * If pain score is 4 or above: No action needed, pain <4.   1. Have you developed a fever since your procedure? No   2.   Have you had an respiratory symptoms (SOB or cough) since your procedure? No   3.   Have you tested positive for COVID 19 since your procedure? No   4.   Have you had any family members/close contacts diagnosed with the COVID 19 since your procedure?  No    If yes to any of these questions please route to Joylene John, RN and Joella Prince, RN

## 2020-01-20 ENCOUNTER — Encounter: Payer: Self-pay | Admitting: Internal Medicine

## 2020-01-20 ENCOUNTER — Other Ambulatory Visit: Payer: Self-pay

## 2020-01-20 MED ORDER — MESALAMINE 1.2 G PO TBEC: 4.8000 g | DELAYED_RELEASE_TABLET | Freq: Every day | ORAL | 3 refills | Status: DC

## 2020-01-25 ENCOUNTER — Ambulatory Visit: Payer: Self-pay | Admitting: Physician Assistant

## 2020-01-27 ENCOUNTER — Telehealth: Payer: Self-pay | Admitting: Internal Medicine

## 2020-01-27 NOTE — Telephone Encounter (Signed)
See my chart message

## 2020-01-27 NOTE — Telephone Encounter (Signed)
Patient is calling to follow up on paperwork that she uploaded via [place] please advise on status

## 2020-01-31 ENCOUNTER — Telehealth: Payer: Self-pay

## 2020-02-01 ENCOUNTER — Other Ambulatory Visit: Payer: Self-pay | Admitting: Internal Medicine

## 2020-02-01 NOTE — Telephone Encounter (Signed)
Had Dr. Henrene Pastor sign patient's FMLA paperwork and faxed it to appropriate number.  Sent patient a Pharmacist, community message that I had done so.

## 2020-02-03 ENCOUNTER — Telehealth: Payer: Self-pay

## 2020-02-03 NOTE — Telephone Encounter (Signed)
Spoke with patient and clarified the exact dates as they needed to appear on her FMLA forms.  Refaxed form

## 2020-02-05 ENCOUNTER — Other Ambulatory Visit: Payer: Self-pay | Admitting: Family Medicine

## 2020-02-05 DIAGNOSIS — F419 Anxiety disorder, unspecified: Secondary | ICD-10-CM

## 2020-02-05 NOTE — Telephone Encounter (Signed)
No future appts. Last ov 09/12/2017. Last refille 0

## 2020-02-07 ENCOUNTER — Encounter: Payer: Self-pay | Admitting: Family Medicine

## 2020-02-07 ENCOUNTER — Telehealth: Payer: Self-pay | Admitting: Family Medicine

## 2020-02-07 ENCOUNTER — Other Ambulatory Visit: Payer: Self-pay | Admitting: Family Medicine

## 2020-02-07 ENCOUNTER — Telehealth: Payer: Self-pay

## 2020-02-07 NOTE — Telephone Encounter (Signed)
Lm on vm 

## 2020-02-07 NOTE — Telephone Encounter (Signed)
Medication sent to patient's pharmacy and mychart message sent to patient regarding weaning dose.

## 2020-02-07 NOTE — Telephone Encounter (Signed)
Pt called in due to she is needing a refill on her Klonopin and she was last seen on 12/29/19. Please advise

## 2020-02-08 NOTE — Telephone Encounter (Signed)
Spoke to patient.  Reviewed and corrected dates and info on FMLA papers and refaxed.

## 2020-02-10 ENCOUNTER — Telehealth: Payer: Self-pay | Admitting: Internal Medicine

## 2020-02-11 ENCOUNTER — Telehealth: Payer: Self-pay

## 2020-02-11 NOTE — Telephone Encounter (Signed)
Spoke with patient and discussed all the areas that needed to be corrected on her paperwork.  Corrected the problems and refaxed.

## 2020-02-11 NOTE — Telephone Encounter (Signed)
FMLA paperwork corrected and refaxed

## 2020-02-20 ENCOUNTER — Other Ambulatory Visit: Payer: Self-pay | Admitting: Family Medicine

## 2020-02-21 ENCOUNTER — Other Ambulatory Visit: Payer: Self-pay | Admitting: Family Medicine

## 2020-02-21 ENCOUNTER — Ambulatory Visit (INDEPENDENT_AMBULATORY_CARE_PROVIDER_SITE_OTHER): Payer: 59 | Admitting: Internal Medicine

## 2020-02-21 ENCOUNTER — Encounter: Payer: Self-pay | Admitting: Internal Medicine

## 2020-02-21 VITALS — BP 122/90 | HR 76 | Ht 64.0 in | Wt 236.0 lb

## 2020-02-21 DIAGNOSIS — K219 Gastro-esophageal reflux disease without esophagitis: Secondary | ICD-10-CM | POA: Diagnosis not present

## 2020-02-21 DIAGNOSIS — D500 Iron deficiency anemia secondary to blood loss (chronic): Secondary | ICD-10-CM | POA: Diagnosis not present

## 2020-02-21 DIAGNOSIS — R1030 Lower abdominal pain, unspecified: Secondary | ICD-10-CM | POA: Diagnosis not present

## 2020-02-21 DIAGNOSIS — R197 Diarrhea, unspecified: Secondary | ICD-10-CM | POA: Diagnosis not present

## 2020-02-21 MED ORDER — FERROUS SULFATE 324 (65 FE) MG PO TBEC: 1.0000 | DELAYED_RELEASE_TABLET | Freq: Two times a day (BID) | ORAL | 6 refills | Status: DC

## 2020-02-21 NOTE — Progress Notes (Signed)
HISTORY OF PRESENT ILLNESS:  Melinda Porter is a 49 y.o. female with rheumatoid arthritis, hypertension, hyperlipidemia, obesity, GERD, PTSD, and anxiety/depression who was evaluated in this office January 14, 2020 for persistent diarrhea with abdominal cramping, mucus, and minor bleeding.  CT scan showing mild colitis.  Also noted to have iron deficiency anemia.  For these problems, and chronic GERD, she underwent colonoscopy and upper endoscopy January 17, 2020.  Upper endoscopy was essentially normal.  Duodenal biopsies were unremarkable.  Colonoscopy revealed a normal terminal ileum.  There was mild patchy erythema throughout the colon.  Biopsies revealed active colitis favoring inflammatory bowel disease.  She was placed on the although 4.8 g daily.  She follows up at this time.  He tells me that her symptoms have improved.  Previously 10 loose stools per day and night.  Currently with 4-5 bowel movements per day which are, overall, more formed.  No mucus or bleeding.  No significant abdominal pain.  She is tolerating her medications well.  Not using Imodium.  Inquires about replacement therapy.  Sulfasalazine for her arthritis per her rheumatologist.  Not sure if she needs both agents.  She will discuss this with her rheumatologist later this week  REVIEW OF SYSTEMS:  All non-GI ROS negative unless otherwise stated in the HPI except for fatigue, arthritis, anxiety, headaches  Past Medical History:  Diagnosis Date  . Anemia   . Anxiety   . Anxiety   . Arthritis   . Back pain   . Bilateral swelling of feet   . Depression   . Endometriosis   . Fatty liver   . Fibromyalgia   . GERD (gastroesophageal reflux disease)   . Hiatal hernia   . Hyperlipemia   . Hypertension   . IBS (irritable bowel syndrome)   . Joint pain   . Lupus ([place])   . PTSD (post-traumatic stress disorder)   . Vitamin D deficiency     Past Surgical History:  Procedure Laterality Date  . CESAREAN SECTION  1997  .  FLEXIBLE SIGMOIDOSCOPY N/A 01/06/2017   Procedure: FLEXIBLE SIGMOIDOSCOPY;  Surgeon: Yetta Flock, MD;  Location: [facility];  Service: Gastroenterology;  Laterality: N/A;  . OOPHORECTOMY Bilateral   . PARTIAL HYSTERECTOMY  07/2009  . TEMPOROMANDIBULAR JOINT SURGERY  2009   right side  . TONSILLECTOMY  2009    Social History Melinda Porter  reports that she quit smoking about 20 years ago. Her smoking use included cigarettes. She has never used smokeless tobacco. She reports that she does not drink alcohol and does not use drugs.  family history includes Alcoholism in her father; Colon cancer in her maternal uncle; Colon polyps in her maternal grandmother; Diabetes in her maternal grandfather; Heart disease in her maternal grandfather; Hypertension in her maternal grandfather; Liver disease in her father; Lung cancer in her maternal grandmother; Thyroid disease in her mother.  Allergies  Allergen Reactions  . Lyrica [Pregabalin] Shortness Of Breath  . Ciprofloxacin Nausea Only  . Gabapentin     Stroke like symptoms  . Nsaids Other (See Comments)    Liver problems- can't take tylenol, ibuprofen etc.  . Tolmetin     Other reaction(s): Other (See Comments) Liver problems- can't take tylenol, ibuprofen etc.  . Codeine Rash  . Humira [Adalimumab] Rash    Worsening of Lupus rash.  . Morphine And Related Rash    "Wired up"  . Penicillins Rash    Has patient had a PCN reaction causing  immediate rash, facial/tongue/throat swelling, SOB or lightheadedness with hypotension: NO Has patient had a PCN reaction causing severe rash involving mucus membranes or skin necrosis: NO Has patient had a PCN reaction that required hospitalization NO Has patient had a PCN reaction occurring within the last 10 years: NO If all of the above answers are "NO", then may proceed with Cephalosporin use.       PHYSICAL EXAMINATION: Vital signs: BP 122/90   Pulse 76   Ht 5' 4"  (1.626 m)   Wt 236 lb  (107 kg)   LMP 06/23/2009   SpO2 98%   BMI 40.51 kg/m   Constitutional: generally well-appearing, no acute distress Psychiatric: alert and oriented x3, cooperative Eyes: extraocular movements intact, anicteric, conjunctiva pink Mouth: oral pharynx moist, no lesions Neck: supple no lymphadenopathy Cardiovascular: heart regular rate and rhythm, no murmur Lungs: clear to auscultation bilaterally Abdomen: soft, obese, nontender, nondistended, no obvious ascites, no peritoneal signs, normal bowel sounds, no organomegaly Rectal: Omitted Extremities: no lower extremity edema bilaterally Skin: Tattoos.  No clinically significant lesions on visible extremities Neuro: No focal deficits.  Cranial nerves intact  ASSESSMENT:  1.  Mild ulcerative colitis.  Proving on Lialda 4.8 g daily 2.  Iron deficiency anemia.  Hemoglobin December 29, 2019 was 10.6 with MCV 71.5 testing for celiac disease was negative serologically and histologically.  Iron saturation earlier this year 7% with ferritin 15. 3.  Multiple medical problems   PLAN:  1.  Continue Lialda 4.8 g daily 2.  Okay to use Imodium regularly, if needed 3.  Prescribe iron sulfate 325 mg twice daily 4.  Routine office follow-up in 2 months.  Recheck blood work at that time A total time of 30 minutes was required preparing to see the patient, reviewing blood work, endoscopy reports, and pathology.  Obtaining comprehensive interval history.  Performing comprehensive physical exam, counseling the patient regarding her above listed issues, prescribing medications.  Arranging follow-up.  And documenting clinical information in the health record.

## 2020-02-21 NOTE — Patient Instructions (Signed)
Continue Lialda 4.8 grams daily.  We have sent the following medications to your pharmacy for you to pick up at your convenience: Iron sulfate 325 mg twice daily   Please follow up with Dr Henrene Pastor in the office in 2 months.  If you are age 47 or younger, your body mass index should be between 19-25. Your Body mass index is 40.51 kg/m. If this is out of the aformentioned range listed, please consider follow up with your Primary Care Provider.   Due to recent changes in healthcare laws, you may see the results of your imaging and laboratory studies on MyChart before your provider has had a chance to review them.  We understand that in some cases there may be results that are confusing or concerning to you. Not all laboratory results come back in the same time frame and the provider may be waiting for multiple results in order to interpret others.  Please give [place] 48 hours in order for your provider to thoroughly review all the results before contacting the office for clarification of your results.

## 2020-02-22 NOTE — Telephone Encounter (Signed)
Pharmacy requests refill on: Pantoprazole SOD DR 40 mg  LAST REFILL: 09/30/2019 LAST OV: 12/29/2019 NEXT OV: Not Scheduled PHARMACY: [facility] [phone] inside [place], [place]

## 2020-02-25 ENCOUNTER — Other Ambulatory Visit: Payer: Self-pay

## 2020-02-25 MED ORDER — FERROUS SULFATE 325 (65 FE) MG PO TABS: 325.0000 mg | ORAL_TABLET | Freq: Two times a day (BID) | ORAL | 6 refills | Status: DC

## 2020-03-08 ENCOUNTER — Other Ambulatory Visit: Payer: Self-pay | Admitting: Family Medicine

## 2020-03-08 DIAGNOSIS — F419 Anxiety disorder, unspecified: Secondary | ICD-10-CM

## 2020-03-08 MED ORDER — CLONAZEPAM 1 MG PO TABS: 1.0000 mg | ORAL_TABLET | Freq: Three times a day (TID) | ORAL | 1 refills | Status: DC | PRN

## 2020-05-29 ENCOUNTER — Encounter: Payer: Self-pay | Admitting: Family Medicine

## 2020-05-29 ENCOUNTER — Other Ambulatory Visit: Payer: Self-pay

## 2020-05-29 ENCOUNTER — Ambulatory Visit (INDEPENDENT_AMBULATORY_CARE_PROVIDER_SITE_OTHER): Payer: Commercial Managed Care - PPO | Admitting: Family Medicine

## 2020-05-29 VITALS — BP 148/84 | HR 76 | Temp 97.3°F | Ht 64.0 in | Wt 242.2 lb

## 2020-05-29 DIAGNOSIS — E785 Hyperlipidemia, unspecified: Secondary | ICD-10-CM

## 2020-05-29 DIAGNOSIS — L4050 Arthropathic psoriasis, unspecified: Secondary | ICD-10-CM | POA: Diagnosis not present

## 2020-05-29 DIAGNOSIS — K51919 Ulcerative colitis, unspecified with unspecified complications: Secondary | ICD-10-CM

## 2020-05-29 DIAGNOSIS — M3219 Other organ or system involvement in systemic lupus erythematosus: Secondary | ICD-10-CM | POA: Diagnosis not present

## 2020-05-29 DIAGNOSIS — R519 Headache, unspecified: Secondary | ICD-10-CM | POA: Diagnosis not present

## 2020-05-29 DIAGNOSIS — D509 Iron deficiency anemia, unspecified: Secondary | ICD-10-CM

## 2020-05-29 DIAGNOSIS — J302 Other seasonal allergic rhinitis: Secondary | ICD-10-CM

## 2020-05-29 DIAGNOSIS — E559 Vitamin D deficiency, unspecified: Secondary | ICD-10-CM

## 2020-05-29 DIAGNOSIS — F4310 Post-traumatic stress disorder, unspecified: Secondary | ICD-10-CM

## 2020-05-29 DIAGNOSIS — F321 Major depressive disorder, single episode, moderate: Secondary | ICD-10-CM

## 2020-05-29 DIAGNOSIS — I10 Essential (primary) hypertension: Secondary | ICD-10-CM

## 2020-05-29 DIAGNOSIS — M3500 Sicca syndrome, unspecified: Secondary | ICD-10-CM

## 2020-05-29 DIAGNOSIS — F419 Anxiety disorder, unspecified: Secondary | ICD-10-CM

## 2020-05-29 DIAGNOSIS — E538 Deficiency of other specified B group vitamins: Secondary | ICD-10-CM

## 2020-05-29 DIAGNOSIS — B001 Herpesviral vesicular dermatitis: Secondary | ICD-10-CM

## 2020-05-29 DIAGNOSIS — K219 Gastro-esophageal reflux disease without esophagitis: Secondary | ICD-10-CM

## 2020-05-29 MED ORDER — VITAMIN D (ERGOCALCIFEROL) 1.25 MG (50000 UNIT) PO CAPS: 50000.0000 [IU] | ORAL_CAPSULE | ORAL | 3 refills | Status: DC

## 2020-05-29 NOTE — Patient Instructions (Signed)
It was very nice to see you today!  I will refill your vitamin D.  Please come back in about 4 to 6 weeks for your checkup with blood work.  Please come back to see me sooner if needed.  Take care, Dr [name]  Please try these tips to maintain a healthy lifestyle:   Eat at least 3 REAL meals and 1-2 snacks per day.  Aim for no more than 5 hours between eating.  If you eat breakfast, please do so within one hour of getting up.    Each meal should contain half fruits/vegetables, one quarter protein, and one quarter carbs (no bigger than a computer mouse)   Cut down on sweet beverages. This includes juice, soda, and sweet tea.     Drink at least 1 glass of water with each meal and aim for at least 8 glasses per day   Exercise at least 150 minutes every week.

## 2020-05-29 NOTE — Assessment & Plan Note (Signed)
Check B12 with next blood draw.

## 2020-05-29 NOTE — Assessment & Plan Note (Signed)
Uses Valtrex as needed for outbreaks.

## 2020-05-29 NOTE — Assessment & Plan Note (Signed)
On iron supplementation. Recheck labs at next office vist.

## 2020-05-29 NOTE — Assessment & Plan Note (Addendum)
On vit D 50000IU weekly.  Check vitamin D next blood draw.

## 2020-05-29 NOTE — Assessment & Plan Note (Signed)
On topamax  115m twice daily and tolerating well.

## 2020-05-29 NOTE — Assessment & Plan Note (Signed)
On Protonix 40 mg daily.

## 2020-05-29 NOTE — Assessment & Plan Note (Signed)
Stable on Zoloft 150 mg daily and clonazepam 1 mg 3 times daily as needed.

## 2020-05-29 NOTE — Assessment & Plan Note (Signed)
Continue Flonase.  Cannot tolerate antihistamines due to Sjogren's syndrome.

## 2020-05-29 NOTE — Assessment & Plan Note (Addendum)
Above goal though has not taken meds. She is on losartan 67m daily. She will come back in about 4 weeks and we can recheck then.

## 2020-05-29 NOTE — Assessment & Plan Note (Signed)
Check lipids next blood draw.

## 2020-05-29 NOTE — Assessment & Plan Note (Signed)
Stable on Zoloft 150 mg daily.

## 2020-05-29 NOTE — Assessment & Plan Note (Signed)
Symptoms currently manageable on Zoloft 150 mg daily and Klonopin 1 mg 3 times daily as needed.

## 2020-05-29 NOTE — Assessment & Plan Note (Signed)
Follows with rheumatology.  Avoids antihistamines.

## 2020-05-29 NOTE — Progress Notes (Signed)
Melinda Porter is a 50 y.o. female who presents today for an office visit.  Assessment/Plan:  Chronic Problems Addressed Today: Psoriatic arthritis ([place]) Follows with rheumatology - Dr Amil Amen. On otezla and abatacept, plaquenil 223m twice daily, sulfasalazine 5093mtwice daily. Symptoms are currently manageable.   Ulcerative colitis ([place]. On lialda 1.2g four times daily.   Hyperlipemia Check lipids next blood draw.   Hypertension Above goal though has not taken meds. She is on losartan 5029maily. She will come back in about 4 weeks and we can recheck then.   Vitamin D deficiency On vit D 50000IU weekly.  Check vitamin D next blood draw.  Iron deficiency anemia On iron supplementation. Recheck labs at next office vist.   Persistent headaches On topamax  100m42mice daily and tolerating well.   Vitamin B12 deficiency Check B12 with next blood draw.  Seasonal allergies Continue Flonase.  Cannot tolerate antihistamines due to Sjogren's syndrome.  S[place]'s syndrome ([facility].  Avoids antihistamines.  GERD (gastroesophageal reflux disease) On Protonix 40 mg daily.  PTSD (post-traumatic stress disorder) Symptoms currently manageable on Zoloft 150 mg daily and Klonopin 1 mg 3 times daily as needed.  Anxiety Stable on Zoloft 150 mg daily and clonazepam 1 mg 3 times daily as needed.  Depression, major, single episode, moderate (HCC) Stable on Zoloft 150 mg daily.  Systemic lupus erythematosus ([facility] with dermat[place].  On Plaquenil 200 mg twice daily.  Cold sore Uses Valtrex as needed for outbreaks.     Subjective:  HPI:  See A/p.    PMH:  The following were reviewed and entered/updated in epic: Past Medical History:  Diagnosis Date  . Anemia   . Anxiety   . Anxiety   . Arthritis   . Back pain   . Bilateral swelling of feet   . Depression   . Endometriosis   . Fatty liver   . Fibromyalgia   . GERD  (gastroesophageal reflux disease)   . Hiatal hernia   . Hyperlipemia   . Hypertension   . IBS (irritable bowel syndrome)   . Joint pain   . Lupus (HCC)[place]. PTSD (post-traumatic stress disorder)   . Vitamin D deficiency    Patient Active Problem List   Diagnosis Date Noted  . Depression, major, single episode, moderate (HCC)[place]/14/2022  . Anxiety 05/29/2020  . PTSD (post-traumatic stress disorder) 05/29/2020  . Vitamin D deficiency 05/29/2020  . Iron deficiency anemia 05/29/2020  . GERD (gastroesophageal reflux disease) 05/29/2020  . Sjogren's syndrome (HCC)[place]/14/2022  . Seasonal allergies 05/29/2020  . Vitamin B12 deficiency 05/29/2020  . Cold sore 05/29/2020  . Systemic lupus erythematosus (HCC)[place]/15/2021  . Ulcerative colitis (HCC)[place]/23/2018  . Headache 11/05/2014  . Persistent headaches 11/05/2014  . Facial droop 11/05/2014  . Syncope 11/05/2014  . Hypertension 11/05/2014  . Hyperlipemia 11/05/2014  . Psoriatic arthritis (HCC)[place]/23/2016   Past Surgical History:  Procedure Laterality Date  . CESAREAN SECTION  1997  . FLEXIBLE SIGMOIDOSCOPY N/A 01/06/2017   Procedure: FLEXIBLE SIGMOIDOSCOPY;  Surgeon: ArmbYetta Flock;  Location: [facility];  Service: Gastroenterology;  Laterality: N/A;  . OOPHORECTOMY Bilateral   . PARTIAL HYSTERECTOMY  07/2009  . TEMPOROMANDIBULAR JOINT SURGERY  2009   right side  . TONSILLECTOMY  2009    Family History  Problem Relation Age of Onset  . Colon polyps Maternal Grandmother   . Lung cancer Maternal Grandmother   .  Liver disease Father        agent orange  . Alcoholism Father   . Thyroid disease Mother   . Diabetes Maternal Grandfather   . Heart disease Maternal Grandfather   . Hypertension Maternal Grandfather   . Colon cancer Maternal Uncle        great uncle  . Breast cancer Neg Hx   . Esophageal cancer Neg Hx   . Stomach cancer Neg Hx   . Rectal cancer Neg Hx     Medications- reviewed and updated Current  Outpatient Medications  Medication Sig Dispense Refill  . abatacept (ORENCIA) 250 MG injection Inject into the vein.     . clonazePAM (KLONOPIN) 1 MG tablet Take 1 tablet (1 mg total) by mouth 3 (three) times daily as needed for anxiety. 90 tablet 1  . ferrous sulfate 324 (65 Fe) MG TBEC Take 1 tablet (325 mg total) by mouth in the morning and at bedtime. 60 tablet 6  . fluticasone (FLONASE) 50 MCG/ACT nasal spray SPRAY 2 SPRAYS INTO EACH NOSTRIL EVERY DAY 48 mL 2  . hydroxychloroquine (PLAQUENIL) 200 MG tablet Take 200 mg by mouth 2 (two) times daily.    [name] losartan (COZAAR) 100 MG tablet Take 1 tablet (100 mg total) by mouth daily. 30 tablet 5  . mesalamine (LIALDA) 1.2 g EC tablet TAKE 4 TABLETS BY MOUTH DAILY WITH BREAKFAST. 360 tablet 3  . ondansetron (ZOFRAN-ODT) 8 MG disintegrating tablet Take 1 tablet (8 mg total) by mouth every 8 (eight) hours as needed for nausea. 30 tablet 0  . OTEZLA 30 MG TABS Take 1 tablet by mouth 2 (two) times daily.    . pantoprazole (PROTONIX) 40 MG tablet TAKE 1 TABLET BY MOUTH EVERY DAY 90 tablet 1  . sertraline (ZOLOFT) 100 MG tablet TAKE 1.5 TABLETS (150 MG TOTAL) DAILY BY MOUTH. 135 tablet 1  . sulfaSALAzine (AZULFIDINE) 500 MG tablet Take 500 mg by mouth 2 (two) times daily.    [name] topiramate (TOPAMAX) 100 MG tablet TAKE 1 TABLET BY MOUTH TWICE A DAY 180 tablet 1  . valACYclovir (VALTREX) 1000 MG tablet Take 2 tablets (2,000 mg total) by mouth every 12 (twelve) hours. For two doses for cold sores. 20 tablet 0  . Vitamin D, Ergocalciferol, (DRISDOL) 1.25 MG (50000 UNIT) CAPS capsule Take 1 capsule (50,000 Units total) by mouth every 7 (seven) days. 12 capsule 3   No current facility-administered medications for this visit.    Allergies-reviewed and updated Allergies  Allergen Reactions  . Lyrica [Pregabalin] Shortness Of Breath  . Ciprofloxacin Nausea Only  . Gabapentin     Stroke like symptoms  . Nsaids Other (See Comments)    Liver problems- can't  take tylenol, ibuprofen etc.  . Tolmetin     Other reaction(s): Other (See Comments) Liver problems- can't take tylenol, ibuprofen etc.  . Codeine Rash  . Humira [Adalimumab] Rash    Worsening of Lupus rash.  . Morphine And Related Rash    "Wired up"  . Penicillins Rash    Has patient had a PCN reaction causing immediate rash, facial/tongue/throat swelling, SOB or lightheadedness with hypotension: NO Has patient had a PCN reaction causing severe rash involving mucus membranes or skin necrosis: NO Has patient had a PCN reaction that required hospitalization NO Has patient had a PCN reaction occurring within the last 10 years: NO If all of the above answers are "NO", then may proceed with Cephalosporin use.    Social History  Socioeconomic History  . Marital status: Widowed    Spouse name: Not on file  . Number of children: 1  . Years of education: Not on file  . Highest education level: Not on file  Occupational History  . 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 Smoking status: Former Smoker    Types: Cigarettes    Quit date: 03/09/1999    Years since quitting: 21.2  . Smokeless tobacco: Never Used  Vaping Use  . Vaping Use: Never used  Substance and Sexual Activity  . Alcohol use: No    Alcohol/week: 0.0 standard drinks  . Drug use: No  . Sexual activity: Yes    Partners: Male    Birth control/protection: Surgical  Other Topics Concern  . Not on file  Social History Narrative  . Not on file   Social Determinants of Health   Financial Resource Strain: Not on file  Food Insecurity: Not on file  Transportation Needs: Not on file  Physical Activity: Not on file  Stress: Not on file  Social Connections: Not on file          Objective:  Physical Exam: BP (!) 148/84   Pulse 76   Temp (!) 97.3 F (36.3 C) (Temporal)   Ht 5' 4"  (1.626 m)   Wt 242 lb 3.2 oz (109.9 kg)   LMP 06/23/2009   SpO2 98%   BMI 41.57 kg/m   Gen: No acute distress, resting  comfortably CV: Regular rate and rhythm with no murmurs appreciated Pulm: Normal work of breathing, clear to auscultation bilaterally with no crackles, wheezes, or rhonchi Neuro: Grossly normal, moves all extremities Psych: Normal affect and thought content  Time Spent: 50 minutes of total time was spent on the date of the encounter performing the following actions: chart review prior to seeing the patient including visits with previous pcp obtaining history, performing a medically necessary exam, counseling on the treatment plan, placing orders, and documenting in our EHR.        Algis Greenhouse. [name], MD 05/29/2020 10:06 AM

## 2020-05-29 NOTE — Assessment & Plan Note (Signed)
Follows with GI. On lialda 1.2g four times daily.

## 2020-05-29 NOTE — Assessment & Plan Note (Addendum)
Follows with rheumatology - Dr Amil Amen. On otezla and abatacept, plaquenil 241m twice daily, sulfasalazine 5066mtwice daily. Symptoms are currently manageable.

## 2020-05-29 NOTE — Assessment & Plan Note (Signed)
Follows with dermatology.  On Plaquenil 200 mg twice daily.

## 2020-06-12 ENCOUNTER — Telehealth: Payer: Self-pay

## 2020-06-12 NOTE — Telephone Encounter (Signed)
  LAST APPOINTMENT DATE: 05/29/2020   NEXT APPOINTMENT DATE:@3 /28/2022  MEDICATION:clonazePAM (KLONOPIN) 1 MG tablet  PHARMACY:[id number] - [place], [address] - [address] AT [address]  Please advise

## 2020-06-13 ENCOUNTER — Other Ambulatory Visit: Payer: Self-pay

## 2020-06-13 DIAGNOSIS — F419 Anxiety disorder, unspecified: Secondary | ICD-10-CM

## 2020-06-13 MED ORDER — CLONAZEPAM 1 MG PO TABS: 1.0000 mg | ORAL_TABLET | Freq: Three times a day (TID) | ORAL | 1 refills | Status: DC | PRN

## 2020-06-13 NOTE — Telephone Encounter (Signed)
Medication refill has bee sent to Dr. Ellwood Handler refill pool.

## 2020-07-10 ENCOUNTER — Telehealth: Payer: Self-pay | Admitting: *Deleted

## 2020-07-10 ENCOUNTER — Other Ambulatory Visit: Payer: Self-pay

## 2020-07-10 ENCOUNTER — Encounter: Payer: Self-pay | Admitting: Family Medicine

## 2020-07-10 ENCOUNTER — Ambulatory Visit (INDEPENDENT_AMBULATORY_CARE_PROVIDER_SITE_OTHER): Payer: Commercial Managed Care - PPO | Admitting: Family Medicine

## 2020-07-10 VITALS — BP 142/86 | HR 71 | Temp 98.8°F | Ht 64.0 in | Wt 248.6 lb

## 2020-07-10 DIAGNOSIS — Z0001 Encounter for general adult medical examination with abnormal findings: Secondary | ICD-10-CM

## 2020-07-10 DIAGNOSIS — E559 Vitamin D deficiency, unspecified: Secondary | ICD-10-CM | POA: Diagnosis not present

## 2020-07-10 DIAGNOSIS — R739 Hyperglycemia, unspecified: Secondary | ICD-10-CM | POA: Diagnosis not present

## 2020-07-10 DIAGNOSIS — D509 Iron deficiency anemia, unspecified: Secondary | ICD-10-CM | POA: Diagnosis not present

## 2020-07-10 DIAGNOSIS — E785 Hyperlipidemia, unspecified: Secondary | ICD-10-CM | POA: Diagnosis not present

## 2020-07-10 DIAGNOSIS — E538 Deficiency of other specified B group vitamins: Secondary | ICD-10-CM | POA: Diagnosis not present

## 2020-07-10 DIAGNOSIS — I10 Essential (primary) hypertension: Secondary | ICD-10-CM | POA: Diagnosis not present

## 2020-07-10 DIAGNOSIS — J302 Other seasonal allergic rhinitis: Secondary | ICD-10-CM

## 2020-07-10 LAB — COMPREHENSIVE METABOLIC PANEL
ALT: 9 U/L (ref 0–35)
AST: 11 U/L (ref 0–37)
Albumin: 4.4 g/dL (ref 3.5–5.2)
Alkaline Phosphatase: 80 U/L (ref 39–117)
BUN: 12 mg/dL (ref 6–23)
CO2: 23 mEq/L (ref 19–32)
Calcium: 8.9 mg/dL (ref 8.4–10.5)
Chloride: 108 mEq/L (ref 96–112)
Creatinine, Ser: 0.69 mg/dL (ref 0.40–1.20)
GFR: 102.94 mL/min (ref >60.00)
Glucose, Bld: 108 mg/dL — ABNORMAL HIGH (ref 70–99)
Potassium: 4.1 mEq/L (ref 3.5–5.1)
Sodium: 140 mEq/L (ref 135–145)
Total Bilirubin: 0.3 mg/dL (ref 0.2–1.2)
Total Protein: 6.3 g/dL (ref 6.0–8.3)

## 2020-07-10 LAB — CBC
HCT: 32.7 % — ABNORMAL LOW (ref 36.0–46.0)
Hemoglobin: 10.1 g/dL — ABNORMAL LOW (ref 12.0–15.0)
MCHC: 31 g/dL (ref 30.0–36.0)
MCV: 66.4 fl — ABNORMAL LOW (ref 78.0–100.0)
Platelets: 183 10*3/uL (ref 150.0–400.0)
RBC: 4.92 Mil/uL (ref 3.87–5.11)
RDW: 18.2 % — ABNORMAL HIGH (ref 11.5–15.5)
WBC: 7.4 10*3/uL (ref 4.0–10.5)

## 2020-07-10 LAB — VITAMIN D 25 HYDROXY (VIT D DEFICIENCY, FRACTURES): VITD: 40.94 ng/mL (ref 30.00–100.00)

## 2020-07-10 LAB — TSH: TSH: 2.48 u[IU]/mL (ref 0.35–4.50)

## 2020-07-10 LAB — VITAMIN B12: Vitamin B-12: 660 pg/mL (ref 211–911)

## 2020-07-10 LAB — IBC + FERRITIN
Ferritin: 7 ng/mL — ABNORMAL LOW (ref 10.0–291.0)
Iron: 22 ug/dL — ABNORMAL LOW (ref 42–145)
Saturation Ratios: 4.2 % — ABNORMAL LOW (ref 20.0–50.0)
Transferrin: 377 mg/dL — ABNORMAL HIGH (ref 212.0–360.0)

## 2020-07-10 LAB — HEMOGLOBIN A1C: Hgb A1c MFr Bld: 6 % (ref 4.6–6.5)

## 2020-07-10 MED ORDER — OZEMPIC (0.25 OR 0.5 MG/DOSE) 2 MG/1.5ML ~~LOC~~ SOPN: 0.2500 mg | PEN_INJECTOR | SUBCUTANEOUS | 3 refills | Status: DC

## 2020-07-10 MED ORDER — MONTELUKAST SODIUM 10 MG PO TABS
10.0000 mg | ORAL_TABLET | Freq: Every day | ORAL | 3 refills | Status: DC
Start: 2020-07-10 — End: 2021-03-21

## 2020-07-10 NOTE — Telephone Encounter (Signed)
PA send today  Key: [id number]) Rx #: [id number] Ozempic (0.25 or 0.5 MG/DOSE) 2MG/1.5ML pen-injectors Waiting for determination

## 2020-07-10 NOTE — Patient Instructions (Signed)
It was very nice to see you today!  Please try the Ozempic.  Also try Singulair for his allergies.  We will check blood work today.  Please send me a message in a few weeks let me know how things are going.  I will see back in year for your next annual physical.  Come back to see me sooner if needed.  Take care, Dr [name]  PLEASE NOTE:  If you had any lab tests please let us know if you have not heard back within a few days. You may see your results on mychart before we have a chance to review them but we will give you a call once they are reviewed by [place]. If we ordered any referrals today, please let us know if you have not heard from their office within the next week.   Please try these tips to maintain a healthy lifestyle:   Eat at least 3 REAL meals and 1-2 snacks per day.  Aim for no more than 5 hours between eating.  If you eat breakfast, please do so within one hour of getting up.    Each meal should contain half fruits/vegetables, one quarter protein, and one quarter carbs (no bigger than a computer mouse)   Cut down on sweet beverages. This includes juice, soda, and sweet tea.     Drink at least 1 glass of water with each meal and aim for at least 8 glasses per day   Exercise at least 150 minutes every week.    Preventive Care 23-13 Years Old, Female Preventive care refers to lifestyle choices and visits with your health care provider that can promote health and wellness. This includes:  A yearly physical exam. This is also called an annual wellness visit.  Regular dental and eye exams.  Immunizations.  Screening for certain conditions.  Healthy lifestyle choices, such as: ? Eating a healthy diet. ? Getting regular exercise. ? Not using drugs or products that contain nicotine and tobacco. ? Limiting alcohol use. What can I expect for my preventive care visit? Physical exam Your health care provider will check your:  Height and weight. These may be used  to calculate your BMI (body mass index). BMI is a measurement that tells if you are at a healthy weight.  Heart rate and blood pressure.  Body temperature.  Skin for abnormal spots. Counseling Your health care provider may ask you questions about your:  Past medical problems.  Family's medical history.  Alcohol, tobacco, and drug use.  Emotional well-being.  Home life and relationship well-being.  Sexual activity.  Diet, exercise, and sleep habits.  Work and work Statistician.  Access to firearms.  Method of birth control.  Menstrual cycle.  Pregnancy history. What immunizations do I need? Vaccines are usually given at various ages, according to a schedule. Your health care provider will recommend vaccines for you based on your age, medical history, and lifestyle or other factors, such as travel or where you work.   What tests do I need? Blood tests  Lipid and cholesterol levels. These may be checked every 5 years, or more often if you are over 55 years old.  Hepatitis C test.  Hepatitis B test. Screening  Lung cancer screening. You may have this screening every year starting at age 3 if you have a 30-pack-year history of smoking and currently smoke or have quit within the past 15 years.  Colorectal cancer screening. ? All adults should have this screening starting at  age 59 and continuing until age 70. ? Your health care provider may recommend screening at age 16 if you are at increased risk. ? You will have tests every 1-10 years, depending on your results and the type of screening test.  Diabetes screening. ? This is done by checking your blood sugar (glucose) after you have not eaten for a while (fasting). ? You may have this done every 1-3 years.  Mammogram. ? This may be done every 1-2 years. ? Talk with your health care provider about when you should start having regular mammograms. This may depend on whether you have a family history of breast  cancer.  BRCA-related cancer screening. This may be done if you have a family history of breast, ovarian, tubal, or peritoneal cancers.  Pelvic exam and Pap test. ? This may be done every 3 years starting at age 58. ? Starting at age 57, this may be done every 5 years if you have a Pap test in combination with an HPV test. Other tests  STD (sexually transmitted disease) testing, if you are at risk.  Bone density scan. This is done to screen for osteoporosis. You may have this scan if you are at high risk for osteoporosis. Talk with your health care provider about your test results, treatment options, and if necessary, the need for more tests. Follow these instructions at home: Eating and drinking  Eat a diet that includes fresh fruits and vegetables, whole grains, lean protein, and low-fat dairy products.  Take vitamin and mineral supplements as recommended by your health care provider.  Do not drink alcohol if: ? Your health care provider tells you not to drink. ? You are pregnant, may be pregnant, or are planning to become pregnant.  If you drink alcohol: ? Limit how much you have to 0-1 drink a day. ? Be aware of how much alcohol is in your drink. In the [place] one drink equals one 12 oz bottle of beer (355 mL), one 5 oz glass of wine (148 mL), or one 1 oz glass of hard liquor (44 mL).   Lifestyle  Take daily care of your teeth and gums. Brush your teeth every morning and night with fluoride toothpaste. Floss one time each day.  Stay active. Exercise for at least 30 minutes 5 or more days each week.  Do not use any products that contain nicotine or tobacco, such as cigarettes, e-cigarettes, and chewing tobacco. If you need help quitting, ask your health care provider.  Do not use drugs.  If you are sexually active, practice safe sex. Use a condom or other form of protection to prevent STIs (sexually transmitted infections).  If you do not wish to become pregnant, use a form  of birth control. If you plan to become pregnant, see your health care provider for a prepregnancy visit.  If told by your health care provider, take low-dose aspirin daily starting at age 45.  Find healthy ways to cope with stress, such as: ? Meditation, yoga, or listening to music. ? Journaling. ? Talking to a trusted person. ? Spending time with friends and family. Safety  Always wear your seat belt while driving or riding in a vehicle.  Do not drive: ? If you have been drinking alcohol. Do not ride with someone who has been drinking. ? When you are tired or distracted. ? While texting.  Wear a helmet and other protective equipment during sports activities.  If you have firearms in your house, make  sure you follow all gun safety procedures. What's next?  Visit your health care provider once a year for an annual wellness visit.  Ask your health care provider how often you should have your eyes and teeth checked.  Stay up to date on all vaccines. This information is not intended to replace advice given to you by your health care provider. Make sure you discuss any questions you have with your health care provider. Document Revised: 01/04/2020 Document Reviewed: 12/11/2017 Elsevier Patient Education  2021 [employer].

## 2020-07-10 NOTE — Assessment & Plan Note (Signed)
Check vitamin D. 

## 2020-07-10 NOTE — Progress Notes (Signed)
Chief Complaint:  Melinda Porter is a 50 y.o. female who presents today for her annual comprehensive physical exam.    Assessment/Plan:  Chronic Problems Addressed Today: Hyperlipemia Check labs today.  Hypertension Borderline elevated.  Continue losartan 50 mg daily.  Check labs today.  Morbid obesity ([place]) She is working on diet and exercise without much improvement.  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  We will start Ozempic 0.25 mg weekly for 4 to 6 weeks and then increase to 0.5 mg weekly.  She will check with me in 2 weeks via MyChart.  Discussed potential side effects.  Vitamin B12 deficiency Check B12.  Seasonal allergies Worsened due to current pollen outbreak.  We will try Singulair.  She has not done this in the past.  She will continue Flonase.  She cannot take antihistamines due to Sjogren's syndrome.  She does not wish to try Nettie pot.  Iron deficiency anemia Check iron panel.  Vitamin D deficiency Check vitamin D   Body mass index is 42.67 kg/m. / Obese    Preventative Healthcare: Check labs.  Up-to-date on colon cancer screening.  No longer needs Pap smear due to history of hysterectomy.  Patient Counseling(The following topics were reviewed and/or handout was given):  -Nutrition: Stressed importance of moderation in sodium/caffeine intake, saturated fat and cholesterol, caloric balance, sufficient intake of fresh fruits, vegetables, and fiber.  -Stressed the importance of regular exercise.   -Substance Abuse: Discussed cessation/primary prevention of tobacco, alcohol, or other drug use; driving or other dangerous activities under the influence; availability of treatment for abuse.   -Injury prevention: Discussed safety belts, safety helmets, smoke detector, smoking near bedding or upholstery.   -Sexuality: Discussed sexually transmitted diseases, partner selection, use of condoms, avoidance of  unintended pregnancy and contraceptive alternatives.   -Dental health: Discussed importance of regular tooth brushing, flossing, and dental visits.  -Health maintenance and immunizations reviewed. Please refer to Health maintenance section.  Return to care in 1 year for next preventative visit.     Subjective:  HPI:   She has no acute complaints today.  See A/P for status of chronic conditions   Lifestyle Diet: Balanced.  Exercise: Limited.  Depression screen [facility] 2/9 07/12/2019  Decreased Interest 3  Down, Depressed, Hopeless 3  PHQ - 2 Score 6  Altered sleeping 1  Tired, decreased energy 2  Change in appetite 1  Feeling bad or failure about yourself  1  Trouble concentrating 0  Moving slowly or fidgety/restless 0  Suicidal thoughts 0  PHQ-9 Score 11  Difficult doing work/chores Somewhat difficult  Some recent data might be hidden    Health Maintenance Due  Topic Date Due  . Hepatitis C Screening  Never done     ROS: Per HPI, otherwise a complete review of systems was negative.   PMH:  The following were reviewed and entered/updated in epic: Past Medical History:  Diagnosis Date  . Anemia   . Anxiety   . Anxiety   . Arthritis   . Back pain   . Bilateral swelling of feet   . Depression   . Endometriosis   . Fatty liver   . Fibromyalgia   . GERD (gastroesophageal reflux disease)   . Hiatal hernia   . Hyperlipemia   . Hypertension   . IBS (irritable bowel syndrome)   . Joint pain   . Lupus ([place])   . PTSD (post-traumatic stress disorder)   .  Vitamin D deficiency    Patient Active Problem List   Diagnosis Date Noted  . Morbid obesity ([place]) 07/10/2020  . Depression, major, single episode, moderate ([place]) 05/29/2020  . Anxiety 05/29/2020  . PTSD (post-traumatic stress disorder) 05/29/2020  . Vitamin D deficiency 05/29/2020  . Iron deficiency anemia 05/29/2020  . GERD (gastroesophageal reflux disease) 05/29/2020  . Sjogren's syndrome ([place]) 05/29/2020  .  Seasonal allergies 05/29/2020  . Vitamin B12 deficiency 05/29/2020  . Cold sore 05/29/2020  . Systemic lupus erythematosus ([place]) 12/29/2019  . Ulcerative colitis ([place]) 01/05/2017  . Headache 11/05/2014  . Persistent headaches 11/05/2014  . Facial droop 11/05/2014  . Syncope 11/05/2014  . Hypertension 11/05/2014  . Hyperlipemia 11/05/2014  . Psoriatic arthritis ([place]) 11/05/2014   Past Surgical History:  Procedure Laterality Date  . CESAREAN SECTION  1997  . FLEXIBLE SIGMOIDOSCOPY N/A 01/06/2017   Procedure: FLEXIBLE SIGMOIDOSCOPY;  Surgeon: Yetta Flock, MD;  Location: [facility];  Service: Gastroenterology;  Laterality: N/A;  . OOPHORECTOMY Bilateral   . PARTIAL HYSTERECTOMY  07/2009  . TEMPOROMANDIBULAR JOINT SURGERY  2009   right side  . TONSILLECTOMY  2009    Family History  Problem Relation Age of Onset  . Colon polyps Maternal Grandmother   . Lung cancer Maternal Grandmother   . Liver disease Father        agent orange  . Alcoholism Father   . Thyroid disease Mother   . Diabetes Maternal Grandfather   . Heart disease Maternal Grandfather   . Hypertension Maternal Grandfather   . Colon cancer Maternal Uncle        great uncle  . Breast cancer Neg Hx   . Esophageal cancer Neg Hx   . Stomach cancer Neg Hx   . Rectal cancer Neg Hx     Medications- reviewed and updated Current Outpatient Medications  Medication Sig Dispense Refill  . clonazePAM (KLONOPIN) 1 MG tablet Take 1 tablet (1 mg total) by mouth 3 (three) times daily as needed for anxiety. 90 tablet 1  . ferrous sulfate 324 (65 Fe) MG TBEC Take 1 tablet (325 mg total) by mouth in the morning and at bedtime. 60 tablet 6  . fluticasone (FLONASE) 50 MCG/ACT nasal spray SPRAY 2 SPRAYS INTO EACH NOSTRIL EVERY DAY 48 mL 2  . hydroxychloroquine (PLAQUENIL) 200 MG tablet Take 200 mg by mouth 2 (two) times daily.    [name] losartan (COZAAR) 100 MG tablet Take 1 tablet (100 mg total) by mouth daily. 30 tablet 5  .  mesalamine (LIALDA) 1.2 g EC tablet TAKE 4 TABLETS BY MOUTH DAILY WITH BREAKFAST. 360 tablet 3  . montelukast (SINGULAIR) 10 MG tablet Take 1 tablet (10 mg total) by mouth at bedtime. 30 tablet 3  . ondansetron (ZOFRAN-ODT) 8 MG disintegrating tablet Take 1 tablet (8 mg total) by mouth every 8 (eight) hours as needed for nausea. 30 tablet 0  . OTEZLA 30 MG TABS Take 1 tablet by mouth 2 (two) times daily.    . pantoprazole (PROTONIX) 40 MG tablet TAKE 1 TABLET BY MOUTH EVERY DAY 90 tablet 1  . Semaglutide,0.25 or 0.5MG/DOS, (OZEMPIC, 0.25 OR 0.5 MG/DOSE,) 2 MG/1.5ML SOPN Inject 0.25 mg into the skin once a week. 1.5 mL 3  . sertraline (ZOLOFT) 100 MG tablet TAKE 1.5 TABLETS (150 MG TOTAL) DAILY BY MOUTH. 135 tablet 1  . sulfaSALAzine (AZULFIDINE) 500 MG tablet Take 500 mg by mouth 2 (two) times daily.    [name] topiramate (TOPAMAX) 100  MG tablet TAKE 1 TABLET BY MOUTH TWICE A DAY 180 tablet 1  . valACYclovir (VALTREX) 1000 MG tablet Take 2 tablets (2,000 mg total) by mouth every 12 (twelve) hours. For two doses for cold sores. 20 tablet 0  . Vitamin D, Ergocalciferol, (DRISDOL) 1.25 MG (50000 UNIT) CAPS capsule Take 1 capsule (50,000 Units total) by mouth every 7 (seven) days. 12 capsule 3   No current facility-administered medications for this visit.    Allergies-reviewed and updated Allergies  Allergen Reactions  . Lyrica [Pregabalin] Shortness Of Breath  . Ciprofloxacin Nausea Only  . Gabapentin     Stroke like symptoms  . Nsaids Other (See Comments)    Liver problems- can't take tylenol, ibuprofen etc.  . Tolmetin     Other reaction(s): Other (See Comments) Liver problems- can't take tylenol, ibuprofen etc.  . Codeine Rash  . Humira [Adalimumab] Rash    Worsening of Lupus rash.  . Morphine And Related Rash    "Wired up"  . Penicillins Rash    Has patient had a PCN reaction causing immediate rash, facial/tongue/throat swelling, SOB or lightheadedness with hypotension: NO Has patient  had a PCN reaction causing severe rash involving mucus membranes or skin necrosis: NO Has patient had a PCN reaction that required hospitalization NO Has patient had a PCN reaction occurring within the last 10 years: NO If all of the above answers are "NO", then may proceed with Cephalosporin use.    Social History   Socioeconomic History  . Marital status: Widowed    Spouse name: Not on file  . Number of children: 1  . Years of education: Not on file  . Highest education level: Not on file  Occupational History  . 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 Smoking status: Former Smoker    Types: Cigarettes    Quit date: 03/09/1999    Years since quitting: 21.3  . Smokeless tobacco: Never Used  Vaping Use  . Vaping Use: Never used  Substance and Sexual Activity  . Alcohol use: No    Alcohol/week: 0.0 standard drinks  . Drug use: No  . Sexual activity: Yes    Partners: Male    Birth control/protection: Surgical  Other Topics Concern  . Not on file  Social History Narrative  . Not on file   Social Determinants of Health   Financial Resource Strain: Not on file  Food Insecurity: Not on file  Transportation Needs: Not on file  Physical Activity: Not on file  Stress: Not on file  Social Connections: Not on file        Objective:  Physical Exam: BP (!) 142/86   Pulse 71   Temp 98.8 F (37.1 C) (Temporal)   Ht 5' 4"  (1.626 m)   Wt 248 lb 9.6 oz (112.8 kg)   LMP 06/23/2009   SpO2 98%   BMI 42.67 kg/m   Body mass index is 42.67 kg/m. Wt Readings from Last 3 Encounters:  07/10/20 248 lb 9.6 oz (112.8 kg)  05/29/20 242 lb 3.2 oz (109.9 kg)  02/21/20 236 lb (107 kg)  Gen: NAD, resting comfortably HEENT: TMs normal bilaterally. OP clear. No thyromegaly noted.  CV: RRR with no murmurs appreciated Pulm: NWOB, CTAB with no crackles, wheezes, or rhonchi GI: Normal bowel sounds present. Soft, Nontender, Nondistended. MSK: no edema, cyanosis, or clubbing  noted Skin: warm, dry Neuro: CN2-12 grossly intact. Strength 5/5 in upper and lower extremities. Reflexes symmetric and intact bilaterally.  Psych: Normal affect and thought content     Julane Crock M. [name], MD 07/10/2020 10:33 AM

## 2020-07-10 NOTE — Assessment & Plan Note (Signed)
Check labs today.

## 2020-07-10 NOTE — Assessment & Plan Note (Signed)
Check iron panel.

## 2020-07-10 NOTE — Assessment & Plan Note (Signed)
Worsened due to current pollen outbreak.  We will try Singulair.  She has not done this in the past.  She will continue Flonase.  She cannot take antihistamines due to Sjogren's syndrome.  She does not wish to try Nettie pot.

## 2020-07-10 NOTE — Assessment & Plan Note (Signed)
Borderline elevated.  Continue losartan 50 mg daily.  Check labs today.

## 2020-07-10 NOTE — Assessment & Plan Note (Signed)
She is working on diet and exercise without much improvement.  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  We will start Ozempic 0.25 mg weekly for 4 to 6 weeks and then increase to 0.5 mg weekly.  She will check with me in 2 weeks via MyChart.  Discussed potential side effects.

## 2020-07-10 NOTE — Assessment & Plan Note (Signed)
Check B12 

## 2020-07-11 LAB — C-PEPTIDE: C-Peptide: 4.24 ng/mL — ABNORMAL HIGH (ref 0.80–3.85)

## 2020-07-12 NOTE — Progress Notes (Signed)
Please inform patient of the following:  Her iron is low. Please make sure that she is taking ferrous sulfate 70m every other day on an empty stomahc with vitamin C. If not, we can consider getting her set up with iron infusion if she wishes. We should recheck again in a few months.   Her blood sugar is borderline. Everything else is NORMAL. I would like for her to come back in 3-6 months to recheck her A1c.  This should hopefully come down some with the ozempic.   CAlgis Greenhouse PJerline Pain MD 07/12/2020 10:53 AM

## 2020-07-13 ENCOUNTER — Other Ambulatory Visit: Payer: Self-pay | Admitting: Family Medicine

## 2020-07-13 DIAGNOSIS — Z1231 Encounter for screening mammogram for malignant neoplasm of breast: Secondary | ICD-10-CM

## 2020-07-14 ENCOUNTER — Telehealth: Payer: Self-pay

## 2020-07-14 NOTE — Telephone Encounter (Signed)
Pt called stating her pharmacy told her that her prescription requires a prior auth. Please get prior auth through covermymeds for:   Semaglutide,0.25 or 0.5MG/DOS, (OZEMPIC, 0.25 OR 0.5 MG/DOSE,) 2 MG/1.5ML SOPN  Please advise.

## 2020-07-16 ENCOUNTER — Encounter: Payer: Self-pay | Admitting: Family Medicine

## 2020-07-17 NOTE — Telephone Encounter (Signed)
PA denied patient Patient was informed

## 2020-07-17 NOTE — Telephone Encounter (Signed)
Please advise 

## 2020-07-17 NOTE — Telephone Encounter (Signed)
PA done, insurance denied patient notified

## 2020-07-21 ENCOUNTER — Other Ambulatory Visit (HOSPITAL_COMMUNITY): Payer: Self-pay

## 2020-07-21 NOTE — Discharge Instructions (Signed)
Infliximab injection What is this medicine? INFLIXIMAB (in [place] i mab) is used to treat Crohn's disease and ulcerative colitis. It is also used to treat ankylosing spondylitis, plaque psoriasis, and some forms of arthritis. This medicine may be used for other purposes; ask your health care provider or pharmacist if you have questions. COMMON BRAND NAME(S): AVSOLA, INFLECTRA, IXIFI, Remicade, RENFLEXIS What should I tell my health care provider before I take this medicine? They need to know if you have any of these conditions:  cancer  current or past resident of [place] or [place]  diabetes  exposure to tuberculosis  Guillain-Barre syndrome  heart failure  hepatitis or liver disease  immune system problems  infection  lung or breathing disease, like COPD  multiple sclerosis  receiving phototherapy for the skin  seizure disorder  an unusual or allergic reaction to infliximab, mouse proteins, other medicines, foods, dyes, or preservatives  pregnant or trying to get pregnant  breast-feeding How should I use this medicine? This medicine is for injection into a vein. It is usually given by a health care professional in a hospital or clinic setting. A special MedGuide will be given to you by the pharmacist with each prescription and refill. Be sure to read this information carefully each time. Talk to your pediatrician regarding the use of this medicine in children. While this drug may be prescribed for children as young as 91 years of age for selected conditions, precautions do apply. Overdosage: If you think you have taken too much of this medicine contact a poison control center or emergency room at once. NOTE: This medicine is only for you. Do not share this medicine with others. What if I miss a dose? It is important not to miss your dose. Call your doctor or health care professional if you are unable to keep an appointment. What may interact with this  medicine? Do not take this medicine with any of the following medications:  biologic medicines such as abatacept, adalimumab, anakinra, certolizumab, etanercept, golimumab, rituximab, secukinumab, tocilizumab, tofactinib, ustekinumab  live vaccines This list may not describe all possible interactions. Give your health care provider a list of all the medicines, herbs, non-prescription drugs, or dietary supplements you use. Also tell them if you smoke, drink alcohol, or use illegal drugs. Some items may interact with your medicine. What should I watch for while using this medicine? Your condition will be monitored carefully while you are receiving this medicine. Visit your doctor or health care professional for regular checks on your progress. You may need blood work done while you are taking this medicine. Before beginning therapy, your doctor may do a test to see if you have been exposed to tuberculosis. Call your doctor or health care professional for advice if you get a fever, chills or sore throat, or other symptoms of a cold or flu. Do not treat yourself. This drug decreases your body's ability to fight infections. Try to avoid being around people who are sick. This medicine may make the symptoms of heart failure worse in some patients. If you notice symptoms such as increased shortness of breath or swelling of the ankles or legs, contact your health care provider right away. If you are going to have surgery or dental work, tell your health care professional or dentist that you have received this medicine. If you take this medicine for plaque psoriasis, stay out of the sun. If you cannot avoid being in the sun, wear protective clothing and use sunscreen.  Do not use sun lamps or tanning beds/booths. Talk to your doctor about your risk of cancer. You may be more at risk for certain types of cancers if you take this medicine. What side effects may I notice from receiving this medicine? Side effects  that you should report to your doctor or health care professional as soon as possible:  allergic reactions like skin rash, itching or hives, swelling of the face, lips, or tongue  breathing problems  changes in vision  chest pain  fever or chills, usually related to the infusion  joint pain  pain, tingling, numbness in the hands or feet  redness, blistering, peeling or loosening of the skin, including inside the mouth  seizures  signs of infection - fever or chills, cough, sore throat, flu-like symptoms, pain or difficulty passing urine  signs and symptoms of liver injury like dark yellow or brown urine; general ill feeling; light-colored stools; loss of appetite; nausea; right upper belly pain; unusually weak or tired; yellowing of the eyes or skin  signs and symptoms of a stroke like changes in vision; confusion; trouble speaking or understanding; severe headaches; sudden numbness or weakness of the face, arm or leg; trouble walking; dizziness; loss of balance or coordination  swelling of the ankles, feet, or hands  swollen lymph nodes in the neck, underarm, or groin areas  unusual bleeding or bruising  unusually weak or tired Side effects that usually do not require medical attention (report to your doctor or health care professional if they continue or are bothersome):  headache  nausea  stomach pain  upset stomach This list may not describe all possible side effects. Call your doctor for medical advice about side effects. You may report side effects to FDA at 1-800-FDA-1088. Where should I keep my medicine? This drug is given in a hospital or clinic and will not be stored at home. NOTE: This sheet is a summary. It may not cover all possible information. If you have questions about this medicine, talk to your doctor, pharmacist, or health care provider.  2021 Elsevier/Gold Standard (2016-05-01 13:45:32)

## 2020-07-24 ENCOUNTER — Encounter (HOSPITAL_COMMUNITY)
Admission: RE | Admit: 2020-07-24 | Discharge: 2020-07-24 | Disposition: A | Discharge status: Home / Self Care | Payer: Commercial Managed Care - PPO | Source: Ambulatory Visit | Attending: Internal Medicine | Admitting: Internal Medicine

## 2020-07-24 ENCOUNTER — Other Ambulatory Visit: Payer: Self-pay

## 2020-07-24 DIAGNOSIS — L4050 Arthropathic psoriasis, unspecified: Secondary | ICD-10-CM | POA: Insufficient documentation

## 2020-07-24 MED ORDER — SODIUM CHLORIDE 0.9 % IV SOLN
5.0000 mg/kg | Freq: Once | INTRAVENOUS | Status: AC
  Administered 2020-07-24: 600 mg via INTRAVENOUS
  Filled 2020-07-24: qty 60

## 2020-07-28 ENCOUNTER — Other Ambulatory Visit: Payer: Self-pay

## 2020-07-28 ENCOUNTER — Emergency Department (HOSPITAL_BASED_OUTPATIENT_CLINIC_OR_DEPARTMENT_OTHER): Payer: Commercial Managed Care - PPO

## 2020-07-28 ENCOUNTER — Emergency Department (HOSPITAL_BASED_OUTPATIENT_CLINIC_OR_DEPARTMENT_OTHER)
Admission: EM | Admit: 2020-07-28 | Discharge: 2020-07-28 | Disposition: A | Discharge status: Home / Self Care | Payer: Commercial Managed Care - PPO | Attending: Emergency Medicine | Admitting: Emergency Medicine

## 2020-07-28 ENCOUNTER — Encounter (HOSPITAL_BASED_OUTPATIENT_CLINIC_OR_DEPARTMENT_OTHER): Payer: Self-pay | Admitting: *Deleted

## 2020-07-28 DIAGNOSIS — I10 Essential (primary) hypertension: Secondary | ICD-10-CM | POA: Diagnosis not present

## 2020-07-28 DIAGNOSIS — R079 Chest pain, unspecified: Secondary | ICD-10-CM | POA: Insufficient documentation

## 2020-07-28 DIAGNOSIS — R0602 Shortness of breath: Secondary | ICD-10-CM | POA: Insufficient documentation

## 2020-07-28 DIAGNOSIS — Z87891 Personal history of nicotine dependence: Secondary | ICD-10-CM | POA: Diagnosis not present

## 2020-07-28 DIAGNOSIS — M329 Systemic lupus erythematosus, unspecified: Secondary | ICD-10-CM

## 2020-07-28 DIAGNOSIS — Z79899 Other long term (current) drug therapy: Secondary | ICD-10-CM | POA: Diagnosis not present

## 2020-07-28 LAB — CBC WITH DIFFERENTIAL/PLATELET
Abs Immature Granulocytes: 0.03 10*3/uL (ref 0.00–0.07)
Basophils Absolute: 0.2 10*3/uL — ABNORMAL HIGH (ref 0.0–0.1)
Basophils Relative: 2 %
Eosinophils Absolute: 0.3 10*3/uL (ref 0.0–0.5)
Eosinophils Relative: 4 %
HCT: 36.8 % (ref 36.0–46.0)
Hemoglobin: 10.7 g/dL — ABNORMAL LOW (ref 12.0–15.0)
Immature Granulocytes: 0 %
Lymphocytes Relative: 34 %
Lymphs Abs: 3 10*3/uL (ref 0.7–4.0)
MCH: 20.4 pg — ABNORMAL LOW (ref 26.0–34.0)
MCHC: 29.1 g/dL — ABNORMAL LOW (ref 30.0–36.0)
MCV: 70.2 fL — ABNORMAL LOW (ref 80.0–100.0)
Monocytes Absolute: 0.5 10*3/uL (ref 0.1–1.0)
Monocytes Relative: 6 %
Neutro Abs: 4.8 10*3/uL (ref 1.7–7.7)
Neutrophils Relative %: 54 %
Platelets: 227 10*3/uL (ref 150–400)
RBC: 5.24 MIL/uL — ABNORMAL HIGH (ref 3.87–5.11)
RDW: 17.2 % — ABNORMAL HIGH (ref 11.5–15.5)
WBC: 8.8 10*3/uL (ref 4.0–10.5)
nRBC: 0 % (ref 0.0–0.2)

## 2020-07-28 LAB — COMPREHENSIVE METABOLIC PANEL
ALT: 12 U/L (ref 0–44)
AST: 15 U/L (ref 15–41)
Albumin: 4 g/dL (ref 3.5–5.0)
Alkaline Phosphatase: 72 U/L (ref 38–126)
Anion gap: 9 (ref 5–15)
BUN: 12 mg/dL (ref 6–20)
CO2: 24 mmol/L (ref 22–32)
Calcium: 8.8 mg/dL — ABNORMAL LOW (ref 8.9–10.3)
Chloride: 107 mmol/L (ref 98–111)
Creatinine, Ser: 0.66 mg/dL (ref 0.44–1.00)
GFR, Estimated: >60 mL/min (ref >60)
Glucose, Bld: 111 mg/dL — ABNORMAL HIGH (ref 70–99)
Potassium: 3.4 mmol/L — ABNORMAL LOW (ref 3.5–5.1)
Sodium: 140 mmol/L (ref 135–145)
Total Bilirubin: 0.1 mg/dL — ABNORMAL LOW (ref 0.3–1.2)
Total Protein: 7 g/dL (ref 6.5–8.1)

## 2020-07-28 LAB — URINALYSIS, ROUTINE W REFLEX MICROSCOPIC
Bilirubin Urine: NEGATIVE
Glucose, UA: NEGATIVE mg/dL
Hgb urine dipstick: NEGATIVE
Ketones, ur: NEGATIVE mg/dL
Leukocytes,Ua: NEGATIVE
Nitrite: NEGATIVE
Protein, ur: NEGATIVE mg/dL
Specific Gravity, Urine: 1.02 (ref 1.005–1.030)
pH: 6.5 (ref 5.0–8.0)

## 2020-07-28 LAB — BRAIN NATRIURETIC PEPTIDE: B Natriuretic Peptide: 14.6 pg/mL (ref 0.0–100.0)

## 2020-07-28 LAB — PROTIME-INR
INR: 1 (ref 0.8–1.2)
Prothrombin Time: 12.9 seconds (ref 11.4–15.2)

## 2020-07-28 LAB — LIPASE, BLOOD: Lipase: 48 U/L (ref 11–51)

## 2020-07-28 LAB — TROPONIN I (HIGH SENSITIVITY): Troponin I (High Sensitivity): 3 ng/L (ref <18)

## 2020-07-28 NOTE — ED Triage Notes (Signed)
States she had a Remocade infusion 4 days ago. She woke this am and feels like someone is squeezing her lungs.

## 2020-07-28 NOTE — ED Provider Notes (Signed)
[facility] EMERGENC[facility] DEPART[facility] Provider Note   CSN: [id number] Arrival date & time: 07/28/20  1140     History Chief Complaint  Patient presents with  . Shortness of Breath    Melinda Porter is a 50 y.o. female.  HPI Patient reports that she got a Remicade shot 4 days ago.  She reports that she actually felt really well since she had it.  She reports is atypical for her to be pain-free but she had a couple really good days.  She reports this morning she awakened at about 730 and felt that she had a tightness sensation around her chest.  She reports it feels like her lungs are being squeezed.  She illustrates the area of her upper abdomen and lower thorax symmetrically as a source of discomfort.  Pain is not sharp or deep aching quality.  She reports it makes her feel slightly short of breath.  Patient does not have any history of asthma or congestive heart failure.  She has not had any fever or productive cough.  No vomiting or diarrhea.  She reports she did have pneumonia once and she was worried about that potentially.  She reports she feels like she has some swelling in her legs.  This is symmetric.  No pain.  She reports she used to be on a diuretic but then she had a bleeding from her stomach and apparently was told she could not take the diuretic anymore.    Past Medical History:  Diagnosis Date  . Anemia   . Anxiety   . Anxiety   . Arthritis   . Back pain   . Bilateral swelling of feet   . Depression   . Endometriosis   . Fatty liver   . Fibromyalgia   . GERD (gastroesophageal reflux disease)   . Hiatal hernia   . Hyperlipemia   . Hypertension   . IBS (irritable bowel syndrome)   . Joint pain   . Lupus ([place])   . PTSD (post-traumatic stress disorder)   . Vitamin D deficiency     Patient Active Problem List   Diagnosis Date Noted  . Morbid obesity ([place]) 07/10/2020  . Depression, major, single episode, moderate ([place]) 05/29/2020  . Anxiety 05/29/2020  .  PTSD (post-traumatic stress disorder) 05/29/2020  . Vitamin D deficiency 05/29/2020  . Iron deficiency anemia 05/29/2020  . GERD (gastroesophageal reflux disease) 05/29/2020  . Sjogren's syndrome ([place]) 05/29/2020  . Seasonal allergies 05/29/2020  . Vitamin B12 deficiency 05/29/2020  . Cold sore 05/29/2020  . Systemic lupus erythematosus ([place]) 12/29/2019  . Ulcerative colitis ([place]) 01/05/2017  . Headache 11/05/2014  . Persistent headaches 11/05/2014  . Facial droop 11/05/2014  . Syncope 11/05/2014  . Hypertension 11/05/2014  . Hyperlipemia 11/05/2014  . Psoriatic arthritis ([place]) 11/05/2014    Past Surgical History:  Procedure Laterality Date  . CESAREAN SECTION  1997  . FLEXIBLE SIGMOIDOSCOPY N/A 01/06/2017   Procedure: FLEXIBLE SIGMOIDOSCOPY;  Surgeon: Yetta Flock, MD;  Location: [facility];  Service: Gastroenterology;  Laterality: N/A;  . OOPHORECTOMY Bilateral   . PARTIAL HYSTERECTOMY  07/2009  . TEMPOROMANDIBULAR JOINT SURGERY  2009   right side  . TONSILLECTOMY  2009     OB History    Gravida  2   Para  1   Term      Preterm      AB  1   Living  1     SAB  IAB  1   Ectopic      Multiple      Live Births              Family History  Problem Relation Age of Onset  . Colon polyps Maternal Grandmother   . Lung cancer Maternal Grandmother   . Liver disease Father        agent orange  . Alcoholism Father   . Thyroid disease Mother   . Diabetes Maternal Grandfather   . Heart disease Maternal Grandfather   . Hypertension Maternal Grandfather   . Colon cancer Maternal Uncle        great uncle  . Breast cancer Neg Hx   . Esophageal cancer Neg Hx   . Stomach cancer Neg Hx   . Rectal cancer Neg Hx     Social History   Tobacco Use  . Smoking status: Former Smoker    Types: Cigarettes    Quit date: 03/09/1999    Years since quitting: 21.4  . Smokeless tobacco: Never Used  Vaping Use  . Vaping Use: Never used  Substance Use  Topics  . Alcohol use: No    Alcohol/week: 0.0 standard drinks  . Drug use: No    Home Medications Prior to Admission medications   Medication Sig Start Date End Date Taking? Authorizing Provider  clonazePAM (KLONOPIN) 1 MG tablet Take 1 tablet (1 mg total) by mouth 3 (three) times daily as needed for anxiety. 06/13/20   Vivi Barrack, MD  ferrous sulfate 324 (65 Fe) MG TBEC Take 1 tablet (325 mg total) by mouth in the morning and at bedtime. 02/21/20   Irene Shipper, MD  fluticasone [facility]) 50 MCG/ACT nasal spray SPRAY 2 SPRAYS INTO EACH NOSTRIL EVERY DAY 08/20/19   Elby Beck, FNP  hydroxychloroquine (PLAQUENIL) 200 MG tablet Take 200 mg by mouth 2 (two) times daily.    [provider]  losartan (COZAAR) 100 MG tablet Take 1 tablet (100 mg total) by mouth daily. 11/24/19   Elby Beck, FNP  mesalamine (LIALDA) 1.2 g EC tablet TAKE 4 TABLETS BY MOUTH DAILY WITH BREAKFAST. 02/01/20   Irene Shipper, MD  montelukast (SINGULAIR) 10 MG tablet Take 1 tablet (10 mg total) by mouth at bedtime. 07/10/20   Vivi Barrack, MD  ondansetron (ZOFRAN-ODT) 8 MG disintegrating tablet Take 1 tablet (8 mg total) by mouth every 8 (eight) hours as needed for nausea. 12/15/19   Elby Beck, FNP  OTEZLA 30 MG TABS Take 1 tablet by mouth 2 (two) times daily. 11/18/18   [provider]  pantoprazole (PROTONIX) 40 MG tablet TAKE 1 TABLET BY MOUTH EVERY DAY 02/22/20   Elby Beck, FNP  Semaglutide,0.25 or 0.5MG/DOS, (OZEMPIC, 0.25 OR 0.5 MG/DOSE,) 2 MG/1.5ML SOPN Inject 0.25 mg into the skin once a week. 07/10/20   Vivi Barrack, MD  sertraline (ZOLOFT) 100 MG tablet TAKE 1.5 TABLETS (150 MG TOTAL) DAILY BY MOUTH. 02/21/20   Elby Beck, FNP  sulfaSALAzine (AZULFIDINE) 500 MG tablet Take 500 mg by mouth 2 (two) times daily. 11/26/19   [provider]  topiramate (TOPAMAX) 100 MG tablet TAKE 1 TABLET BY MOUTH TWICE A DAY 02/21/20   Elby Beck, FNP   valACYclovir (VALTREX) 1000 MG tablet Take 2 tablets (2,000 mg total) by mouth every 12 (twelve) hours. For two doses for cold sores. 05/17/19   Elby Beck, FNP  Vitamin D, Ergocalciferol, (DRISDOL) 1.25 MG (  50000 UNIT) CAPS capsule Take 1 capsule (50,000 Units total) by mouth every 7 (seven) days. 05/29/20   Vivi Barrack, MD    Allergies    Lyrica [pregabalin], Ciprofloxacin, Gabapentin, Nsaids, Tolmetin, Codeine, Humira [adalimumab], Morphine and related, and Penicillins  Review of Systems   Review of Systems 10 systems reviewed and negative except as per HPI Physical Exam Updated Vital Signs BP (!) 126/56   Pulse 72   Temp 97.8 F (36.6 C) (Oral)   Resp 16   Ht 5' 4"  (1.626 m)   Wt 111.1 kg   LMP 06/23/2009   SpO2 99%   BMI 42.04 kg/m   Physical Exam Constitutional:      Comments: Alert nontoxic.  Clinically well in appearance.  No respiratory distress at rest.  HENT:     Head: Normocephalic and atraumatic.     Mouth/Throat:     Pharynx: Oropharynx is clear.  Eyes:     Extraocular Movements: Extraocular movements intact.  Cardiovascular:     Rate and Rhythm: Normal rate and regular rhythm.     Pulses: Normal pulses.     Heart sounds: Normal heart sounds.  Pulmonary:     Effort: Pulmonary effort is normal.     Breath sounds: Normal breath sounds.  Chest:     Chest wall: No tenderness.  Abdominal:     General: There is no distension.     Palpations: Abdomen is soft.     Tenderness: There is no abdominal tenderness. There is no guarding.  Musculoskeletal:        General: No swelling or tenderness. Normal range of motion.     Right lower leg: No edema.     Left lower leg: No edema.     Comments: Patient perceives she has swelling in her legs.  On my examination, no evidence of edema.  The feet are in good condition.  The overlying skin is soft and pliable.  Ankles have contours.  Patient does have some obesity of the lower legs but they are symmetric, the  skin condition is good and there is no edema present at this time.  Neurological:     General: No focal deficit present.     Mental Status: She is oriented to person, place, and time.     Motor: No weakness.  Psychiatric:        Mood and Affect: Mood normal.     ED Results / Procedures / Treatments   Labs (all labs ordered are listed, but only abnormal results are displayed) Labs Reviewed  COMPREHENSIVE METABOLIC PANEL - Abnormal; Notable for the following components:      Result Value   Potassium 3.4 (*)    Glucose, Bld 111 (*)    Calcium 8.8 (*)    Total Bilirubin 0.1 (*)    All other components within normal limits  CBC WITH DIFFERENTIAL/PLATELET - Abnormal; Notable for the following components:   RBC 5.24 (*)    Hemoglobin 10.7 (*)    MCV 70.2 (*)    MCH 20.4 (*)    MCHC 29.1 (*)    RDW 17.2 (*)    Basophils Absolute 0.2 (*)    All other components within normal limits  LIPASE, BLOOD  BRAIN NATRIURETIC PEPTIDE  PROTIME-INR  URINALYSIS, ROUTINE W REFLEX MICROSCOPIC  TROPONIN I (HIGH SENSITIVITY)  TROPONIN I (HIGH SENSITIVITY)    EKG EKG Interpretation  Date/Time:  Friday July 28 2020 13:11:12 EDT Ventricular Rate:  70 PR Interval:  198 QRS Duration: 90 QT Interval:  395 QTC Calculation: 427 R Axis:   50 Text Interpretation: Sinus rhythm Low voltage, precordial leads no change from previous Confirmed by Charlesetta Shanks [phone]) on 07/28/2020 1:43:28 PM   Radiology DG Chest Port 1 View  Result Date: 07/28/2020 CLINICAL DATA:  States she had a Remocade infusion 4 days ago. She woke this am and feels like someone is squeezing her lungs. EXAM: PORTABLE CHEST 1 VIEW COMPARISON:  Chest radiograph 05/01/2019 FINDINGS: Stable cardiomediastinal contours. Low lung volumes. No new focal opacity. No evidence of edema. No pneumothorax or large pleural effusion. No acute finding in the visualized skeleton. IMPRESSION: No acute cardiopulmonary finding. Electronically Signed    By: Audie Pinto [name]   On: 07/28/2020 14:11    Procedures Procedures   Medications Ordered in ED Medications - No data to display  ED Course  I have reviewed the triage vital signs and the nursing notes.  Pertinent labs & imaging results that were available during my care of the patient were reviewed by me and considered in my medical decision making (see chart for details).    MDM Rules/Calculators/A&P                          Patient presents with perception of tightness in her lungs.  She does not have coronary artery disease or CHF history.  We will proceed with diagnostic evaluation with EKG.  Results on this are normal without change from previous.  Will obtain troponin, BNP and chest x-ray.  Objectively no signs of congestive heart failure on physical exam.  Patient has normal blood pressure on monitor 130s over 70s, heart rate in the 70s sinus rhythm and oxygen saturation 99% on room air.  Low suspicion for PE.  Clinically patient does not have any lower extremity edema, the calves are nontender and discomfort level is highly atypical.  It seems lower probability that this would be related to an adverse effect of Remicade at this time.  Patient does not have any wheezing and has good airflow to the bases.  No other signs of an allergic reaction.  This may need to be monitored closely.  Acutely no sign of significant adverse reaction.  Diagnostic work-up within normal limits.  Vital signs normal.  We discussed possibility of autoimmune or Remicade related symptoms.  At this time however, no appearance of any acute complications.  Patient is to return for reassessment if she has continually worsening symptoms, new and concerning symptoms.  She is to have close follow-up with her PCP and rheumatologist. Final Clinical Impression(s) / ED Diagnoses Final diagnoses:  SOB (shortness of breath)  Lupus ([place])    Rx / DC Orders ED Discharge Orders    None       Charlesetta Shanks,  MD 07/28/20 1515

## 2020-07-31 ENCOUNTER — Telehealth: Payer: Self-pay

## 2020-07-31 DIAGNOSIS — Z1231 Encounter for screening mammogram for malignant neoplasm of breast: Secondary | ICD-10-CM

## 2020-07-31 NOTE — Telephone Encounter (Signed)
Nurse Assessment Nurse: Claiborne Billings, RN, Kim Date/Time (Eastern Time): 07/28/2020 10:30:41 AM Confirm and document reason for call. If symptomatic, describe symptoms. ---Caller states she has had constant tightness in her chest and back since she woke up this morning. Had first infusion of Remicade on Monday. States she also has slight shortness of breath. Does the patient have any new or worsening symptoms? ---Yes Will a triage be completed? ---Yes Related visit to physician within the last 2 weeks? ---Yes Does the PT have any chronic conditions? (i.e. diabetes, asthma, this includes High risk factors for pregnancy, etc.) ---Yes List chronic conditions. ---Psoriatic arthritis, Lupus, Fibromyalgia, Ulcerative Colitis Is the patient pregnant or possibly pregnant? (Ask all females between the ages of 74-55) ---No Is this a behavioral health or substance abuse call? ---No Guidelines Guideline Title Affirmed Question Affirmed Notes Nurse Date/Time Eilene Ghazi Time) Chest Pain [1] Chest pain lasts > 5 minutes AND [2] age > 84 Claiborne Billings, RN, Maudie Mercury 07/28/2020 10:33:37 AM PLEASE NOTE: All timestamps contained within this report are represented as [place] Standard Time. CONFIDENTIALTY NOTICE: This fax transmission is intended only for the addressee. It contains information that is legally privileged, confidential or otherwise protected from use or disclosure. If you are not the intended recipient, you are strictly prohibited from reviewing, disclosing, copying using or disseminating any of this information or taking any action in reliance on or regarding this information. If you have received this fax in error, please notify us immediately by telephone so that we can arrange for its return to [place]. Phone: [phone], Toll-Free: [phone], Fax: [phone] Page: 2 of 2 Call Id: [id number] [place]. Time Eilene Ghazi Time) Disposition Final User 07/28/2020 10:29:24 AM Send to Urgent Queue Dustie, Brittle 07/28/2020 10:41:17 AM 911 Outcome Documentation Claiborne Billings, RN, Kim Reason: Called back after 5 minutes and she did not answer. 07/28/2020 10:36:18 AM Call EMS 911 Now Yes Claiborne Billings, RN, Max Sane Disagree/Comply Comply Caller Understands Yes PreDisposition InappropriateToAsk Care Advice Given Per Guideline CALL EMS 911 NOW: * Immediate medical attention is needed. You need to hang up and call 911 (or an ambulance). * Triager Discretion: I'll call you back in a few minutes to be sure you were able to reach them. CARE ADVICE given per Chest Pain (Adult) guideline

## 2020-08-07 ENCOUNTER — Inpatient Hospital Stay (HOSPITAL_COMMUNITY): Admission: RE | Admit: 2020-08-07 | Payer: Commercial Managed Care - PPO | Source: Ambulatory Visit

## 2020-08-14 ENCOUNTER — Other Ambulatory Visit: Payer: Self-pay | Admitting: Family Medicine

## 2020-08-14 DIAGNOSIS — F419 Anxiety disorder, unspecified: Secondary | ICD-10-CM

## 2020-08-14 NOTE — Telephone Encounter (Signed)
Rx request 

## 2020-08-21 ENCOUNTER — Ambulatory Visit: Payer: Commercial Managed Care - PPO | Admitting: Family Medicine

## 2020-08-21 ENCOUNTER — Ambulatory Visit (INDEPENDENT_AMBULATORY_CARE_PROVIDER_SITE_OTHER): Payer: Commercial Managed Care - PPO

## 2020-08-21 ENCOUNTER — Encounter: Payer: Self-pay | Admitting: Family Medicine

## 2020-08-21 ENCOUNTER — Other Ambulatory Visit: Payer: Self-pay

## 2020-08-21 VITALS — BP 147/84 | HR 77 | Temp 98.4°F | Ht 64.0 in | Wt 251.2 lb

## 2020-08-21 DIAGNOSIS — I10 Essential (primary) hypertension: Secondary | ICD-10-CM

## 2020-08-21 DIAGNOSIS — M25561 Pain in right knee: Secondary | ICD-10-CM

## 2020-08-21 MED ORDER — CELECOXIB 200 MG PO CAPS: 200.0000 mg | ORAL_CAPSULE | Freq: Two times a day (BID) | ORAL | 0 refills | Status: DC

## 2020-08-21 MED ORDER — CYCLOBENZAPRINE HCL 10 MG PO TABS: 10.0000 mg | ORAL_TABLET | Freq: Three times a day (TID) | ORAL | 0 refills | Status: DC | PRN

## 2020-08-21 NOTE — Assessment & Plan Note (Signed)
Elevated today in setting of acute pain.  Continue losartan 50 mg daily.  Recheck next office visit.

## 2020-08-21 NOTE — Progress Notes (Signed)
   Melinda Porter is a 50 y.o. female who presents today for an office visit.  Assessment/Plan:  New/Acute Problems: Right knee pain Concern for possible MCL sprain or either meniscal tear based on her mechanism of injury and physical exam today.  We will start Celebrex.  Check xray.  Will order MRI though it may need to see specialist first.  Recommended elevation and compression as much as tolerated.  Back pain No red flags.  Likely muscular strain.  Should have improvement above Celebrex.  Also start Flexeril.  She will let us know if not improving.  Chronic Problems Addressed Today: Hypertension Elevated today in setting of acute pain.  Continue losartan 50 mg daily.  Recheck next office visit.     Subjective:  HPI:  Patient here with right knee and right lower back pain.  She fell 2 days ago.  She was walking her dogs when they pulled the leash out from under her.  She fell onto her right lower back but also feels like her right leg became trapped under her.  She has had significant pain to both of these areas since then.  She has tried taking Aleve with improvement in symptoms.  She has also tried Flexeril which made her very sleepy but helps with the pain as well.  No other specific treatments tried.  She has been able to walk however it has been very painful to do so.       Objective:  Physical Exam: BP (!) 147/84   Pulse 77   Temp 98.4 F (36.9 C) (Temporal)   Ht 5' 4"  (1.626 m)   Wt 251 lb 3.2 oz (113.9 kg)   LMP 06/23/2009   SpO2 97%   BMI 43.12 kg/m   Gen: No acute distress, resting comfortably MSK:  - Back: No deformities.  Tender to palpation along right lumbar paraspinal muscles - Right leg: Right knee edematous.  Able to flex and extend the knee however some pain with weightbearing.  Tenderness to palpation along medial joint line.  Pain worsened with valgus stress.  Anterior and posterior drawer signs negative. Neuro: Grossly normal, moves all  extremities Psych: Normal affect and thought content      Emylia Latella M. [name], MD 08/21/2020 2:03 PM

## 2020-08-21 NOTE — Patient Instructions (Signed)
It was very nice to see you today!  I am concerned that you may have torn or sprained your MCL.  Please start the Flexeril for your back.  Please start the Celebrex for the pain in your knee and your back.  We will check an x-ray today.  We will also need to get an MRI.  We may need to send you to see a specialist depending on your insurance coverage.  Take care, Dr [name]  PLEASE NOTE:  If you had any lab tests please let us know if you have not heard back within a few days. You may see your results on mychart before we have a chance to review them but we will give you a call once they are reviewed by [place]. If we ordered any referrals today, please let us know if you have not heard from their office within the next week.   Please try these tips to maintain a healthy lifestyle:   Eat at least 3 REAL meals and 1-2 snacks per day.  Aim for no more than 5 hours between eating.  If you eat breakfast, please do so within one hour of getting up.    Each meal should contain half fruits/vegetables, one quarter protein, and one quarter carbs (no bigger than a computer mouse)   Cut down on sweet beverages. This includes juice, soda, and sweet tea.     Drink at least 1 glass of water with each meal and aim for at least 8 glasses per day   Exercise at least 150 minutes every week.

## 2020-08-22 ENCOUNTER — Other Ambulatory Visit: Payer: Self-pay

## 2020-08-22 MED ORDER — TOPIRAMATE 100 MG PO TABS: 100.0000 mg | ORAL_TABLET | Freq: Two times a day (BID) | ORAL | 1 refills | Status: DC

## 2020-08-22 MED ORDER — SERTRALINE HCL 100 MG PO TABS: 150.0000 mg | ORAL_TABLET | Freq: Every day | ORAL | 1 refills | Status: DC

## 2020-08-22 NOTE — Telephone Encounter (Signed)
Pharmacy requests refill on: Topiramate 100 mg   LAST REFILL: 02/21/2020 (Q-180, R-1) LAST OV: 08/21/2020 NEXT OV: Not Scheduled  PHARMACY: [facility] [place], [place]

## 2020-08-22 NOTE — Telephone Encounter (Signed)
Pharmacy requests refill on: Sertraline 100 mg   LAST REFILL: 02/21/2020 (Q-135, R-1) LAST OV: 08/21/2020 NEXT OV: Not Scheduled  PHARMACY: [facility] [place], [place]

## 2020-08-23 NOTE — Progress Notes (Signed)
Please inform patient of the following:  MRI shows mild arthritis.  No fractures.  As we discussed at her office visit this does not show any soft tissue damage.  We will need to get an MRI to further assess.

## 2020-09-04 ENCOUNTER — Telehealth: Payer: Self-pay

## 2020-09-04 ENCOUNTER — Ambulatory Visit (HOSPITAL_COMMUNITY): Payer: Commercial Managed Care - PPO

## 2020-09-04 NOTE — Telephone Encounter (Signed)
See note

## 2020-09-04 NOTE — Telephone Encounter (Signed)
WE can order MRI lumbar spine without contrast but I am not sure if her insurance will pay for it.  Algis Greenhouse. [name], MD 09/04/2020 12:59 PM

## 2020-09-04 NOTE — Telephone Encounter (Signed)
Patient called and wanted to see if Dr. [name] could add her back to the MRI that she is getting on her knee she would like to get both done together. Please advise.

## 2020-09-05 ENCOUNTER — Other Ambulatory Visit: Payer: Self-pay | Admitting: *Deleted

## 2020-09-05 DIAGNOSIS — M549 Dorsalgia, unspecified: Secondary | ICD-10-CM

## 2020-09-05 NOTE — Telephone Encounter (Signed)
MRI order

## 2020-09-15 ENCOUNTER — Other Ambulatory Visit: Payer: Self-pay | Admitting: Family Medicine

## 2020-09-18 ENCOUNTER — Other Ambulatory Visit: Payer: Self-pay

## 2020-09-18 ENCOUNTER — Telehealth: Payer: Self-pay

## 2020-09-18 MED ORDER — OZEMPIC (0.25 OR 0.5 MG/DOSE) 2 MG/1.5ML ~~LOC~~ SOPN: 0.2500 mg | PEN_INJECTOR | SUBCUTANEOUS | 3 refills | Status: DC

## 2020-09-18 NOTE — Telephone Encounter (Signed)
Patient states the Ozempic was prescribed, and she states that there was a prior authorization that needed to be done but never got done. Wondering if the PA can be done so she can go pick up that prescription.

## 2020-09-18 NOTE — Telephone Encounter (Signed)
Patient returned call regarding PA

## 2020-09-18 NOTE — Telephone Encounter (Signed)
Patient notified Rx denied given number to Appeal patient voices understanding

## 2020-09-21 NOTE — Telephone Encounter (Signed)
Spoke with patient, stated she is unable to take metformin due to health issues  Will placed PA again

## 2020-09-22 ENCOUNTER — Telehealth: Payer: Self-pay | Admitting: *Deleted

## 2020-09-22 NOTE — Telephone Encounter (Signed)
call [facility] at [place]  Waiting for determination

## 2020-09-25 ENCOUNTER — Other Ambulatory Visit: Payer: Commercial Managed Care - PPO

## 2020-09-27 NOTE — Telephone Encounter (Signed)
Patient calling back about prior auth and would like a call back.

## 2020-10-02 NOTE — Telephone Encounter (Signed)
Spoke with Toffie at  [place] at [employer] approved from 09/22/2020-09/22/2021 PA# [id number] Pharmacy and patient notified

## 2020-10-09 ENCOUNTER — Other Ambulatory Visit: Payer: Self-pay | Admitting: Family Medicine

## 2020-10-09 DIAGNOSIS — F419 Anxiety disorder, unspecified: Secondary | ICD-10-CM

## 2020-10-12 ENCOUNTER — Other Ambulatory Visit: Payer: Self-pay | Admitting: Family Medicine

## 2020-10-27 ENCOUNTER — Ambulatory Visit (INDEPENDENT_AMBULATORY_CARE_PROVIDER_SITE_OTHER): Payer: Commercial Managed Care - PPO | Admitting: Family Medicine

## 2020-10-27 ENCOUNTER — Encounter: Payer: Self-pay | Admitting: Family Medicine

## 2020-10-27 ENCOUNTER — Other Ambulatory Visit: Payer: Self-pay

## 2020-10-27 VITALS — BP 137/90 | HR 95 | Temp 98.3°F | Ht 64.0 in | Wt 250.8 lb

## 2020-10-27 DIAGNOSIS — R300 Dysuria: Secondary | ICD-10-CM

## 2020-10-27 DIAGNOSIS — D849 Immunodeficiency, unspecified: Secondary | ICD-10-CM

## 2020-10-27 DIAGNOSIS — I10 Essential (primary) hypertension: Secondary | ICD-10-CM

## 2020-10-27 LAB — POC URINALSYSI DIPSTICK (AUTOMATED)
Bilirubin, UA: NEGATIVE
Blood, UA: NEGATIVE
Glucose, UA: NEGATIVE
Ketones, UA: NEGATIVE
Leukocytes, UA: NEGATIVE
Nitrite, UA: NEGATIVE
Protein, UA: NEGATIVE
Spec Grav, UA: >=1.03 — AB (ref 1.010–1.025)
Urobilinogen, UA: 0.2 E.U./dL
pH, UA: 6 (ref 5.0–8.0)

## 2020-10-27 MED ORDER — CEPHALEXIN 500 MG PO CAPS: 500.0000 mg | ORAL_CAPSULE | Freq: Four times a day (QID) | ORAL | 0 refills | Status: AC

## 2020-10-27 NOTE — Addendum Note (Signed)
Addended by: Loura Back on: 10/27/2020 09:03 AM   Modules accepted: Orders

## 2020-10-27 NOTE — Progress Notes (Signed)
Phone [phone] In person visit   Subjective:   Melinda Porter is a 50 y.o. year old very pleasant female patient who presents for/with See problem oriented charting Chief Complaint  Patient presents with   Flank Pain    Bilateral   Burning with urination    This visit occurred during the SARS-CoV-2 public health emergency.  Safety protocols were in place, including screening questions prior to the visit, additional usage of staff PPE, and extensive cleaning of exam room while observing appropriate contact time as indicated for disinfecting solutions.   Past Medical History-  Patient Active Problem List   Diagnosis Date Noted   Morbid obesity ([place]) 07/10/2020   Depression, major, single episode, moderate ([place]) 05/29/2020   Anxiety 05/29/2020   PTSD (post-traumatic stress disorder) 05/29/2020   Vitamin D deficiency 05/29/2020   Iron deficiency anemia 05/29/2020   GERD (gastroesophageal reflux disease) 05/29/2020   Sjogren's syndrome ([place]) 05/29/2020   Seasonal allergies 05/29/2020   Vitamin B12 deficiency 05/29/2020   Cold sore 05/29/2020   Systemic lupus erythematosus ([place]) 12/29/2019   Ulcerative colitis ([place]) 01/05/2017   Headache 11/05/2014   Persistent headaches 11/05/2014   Facial droop 11/05/2014   Syncope 11/05/2014   Hypertension 11/05/2014   Hyperlipemia 11/05/2014   Psoriatic arthritis ([place]) 11/05/2014    Medications- reviewed and updated Current Outpatient Medications  Medication Sig Dispense Refill   cephALEXin (KEFLEX) 500 MG capsule Take 1 capsule (500 mg total) by mouth 4 (four) times daily for 7 days. 28 capsule 0   clonazePAM (KLONOPIN) 1 MG tablet TAKE 1 TABLET(1 MG) BY MOUTH THREE TIMES DAILY AS NEEDED FOR ANXIETY 90 tablet 2   cyclobenzaprine (FLEXERIL) 10 MG tablet Take 1 tablet (10 mg total) by mouth 3 (three) times daily as needed for muscle spasms. 30 tablet 0   fluticasone (FLONASE) 50 MCG/ACT nasal spray SPRAY 2 SPRAYS INTO EACH NOSTRIL  EVERY DAY 48 mL 2   hydroxychloroquine (PLAQUENIL) 200 MG tablet Take 200 mg by mouth 2 (two) times daily.     losartan (COZAAR) 100 MG tablet Take 1 tablet (100 mg total) by mouth daily. 30 tablet 5   mesalamine (LIALDA) 1.2 g EC tablet TAKE 4 TABLETS BY MOUTH DAILY WITH BREAKFAST. 360 tablet 3   pantoprazole (PROTONIX) 40 MG tablet TAKE 1 TABLET BY MOUTH EVERY DAY 90 tablet 1   Semaglutide,0.25 or 0.5MG/DOS, (OZEMPIC, 0.25 OR 0.5 MG/DOSE,) 2 MG/1.5ML SOPN Inject 0.25 mg into the skin once a week. 1.5 mL 3   sertraline (ZOLOFT) 100 MG tablet Take 1.5 tablets (150 mg total) by mouth daily. 135 tablet 1   sulfaSALAzine (AZULFIDINE) 500 MG tablet Take 500 mg by mouth 2 (two) times daily.     topiramate (TOPAMAX) 100 MG tablet Take 1 tablet (100 mg total) by mouth 2 (two) times daily. 180 tablet 1   TREMFYA 100 MG/ML SOPN Inject into the skin.     valACYclovir (VALTREX) 1000 MG tablet Take 2 tablets (2,000 mg total) by mouth every 12 (twelve) hours. For two doses for cold sores. 20 tablet 0   Vitamin D, Ergocalciferol, (DRISDOL) 1.25 MG (50000 UNIT) CAPS capsule Take 1 capsule (50,000 Units total) by mouth every 7 (seven) days. 12 capsule 3   ferrous sulfate 324 (65 Fe) MG TBEC Take 1 tablet (325 mg total) by mouth in the morning and at bedtime. (Patient not taking: Reported on 10/27/2020) 60 tablet 6   montelukast (SINGULAIR) 10 MG tablet Take 1 tablet (10 mg  total) by mouth at bedtime. (Patient not taking: Reported on 10/27/2020) 30 tablet 3   ondansetron (ZOFRAN-ODT) 8 MG disintegrating tablet Take 1 tablet (8 mg total) by mouth every 8 (eight) hours as needed for nausea. (Patient not taking: Reported on 10/27/2020) 30 tablet 0   No current facility-administered medications for this visit.     Objective:  BP 137/90   Pulse 95   Temp 98.3 F (36.8 C) (Temporal)   Ht 5' 4"  (1.626 m)   Wt 250 lb 12.8 oz (113.8 kg)   LMP 06/23/2009   SpO2 96%   BMI 43.05 kg/m  Gen: NAD, resting  comfortably CV: RRR no murmurs rubs or gallops Lungs: CTAB no crackles, wheeze, rhonchi Abdomen: soft/nontender except for mild suprapubic discomfort/nondistended/normal bowel sounds. No rebound or guarding. No cva tenderness on exam Ext: trace edema Skin: warm, dry    Assessment and Plan   #Concern for UTI S: Patients symptoms started last week at least 7 days ago. Feels somewhat weak overall - has some upset setomach but hard to tell with her UC history.  Complains of dysuria: yes; polyuria: not sure- drinks a lot of water; nocturia: no; urgency: yes.  Symptoms are worsening. Has had chills but no fever. Some cva tenderness that is not new- has this intermittently like her nausea- not sure if this is different than baseline symptoms. She states this feels similar to prior UTIs. States blood sugar has not been elevated from baseline.  ROS- no fever, vomiting. No vaginal discharge No blood in urine.   Immunosuppressed on tremfya and plaquenil A/P: UA could not be obtained- she is going to hydrate and retry. Based on symptoms and history Likely UTI. Will get culture. Empiric treatment with: keflex as below. Try to push 4x a day given immunosuppressed status on tremfya.   Patient to follow up if new or worsening symptoms or failure to improve.   #hypertension S: medication: losartan 30m- states only taking half dose.  Home readings #s: often up to 150 BP Readings from Last 3 Encounters:  10/27/20 137/90  08/21/20 (!) 147/84  07/28/20 (!) 126/56  A/P: poor control at home and in office- recommended starting back on full tablet  of losartan 1019m She states previously that she was reduced when having GI symptoms but that ultimately was found to be ulcerative colitis which is now under treatment.   Recommended follow up: Return for as needed for new, worsening, persistent symptoms.  Lab/Order associations:   ICD-10-CM   1. Dysuria  R30.0 POCT Urinalysis Dipstick (Automated)    Urine  Culture    2. Immunosuppressed status ([place] D84.9     3. Primary hypertension  I10      Meds ordered this encounter  Medications   cephALEXin (KEFLEX) 500 MG capsule    Sig: Take 1 capsule (500 mg total) by mouth 4 (four) times daily for 7 days.    Dispense:  28 capsule    Refill:  0   Return precautions advised.  StGarret ReddishMD

## 2020-10-27 NOTE — Patient Instructions (Addendum)
Team please give her another bottle of water or two if needed but need to collect urine before she goes.   Start keflex for potential UTI.   Recommended follow up: Return for as needed for new, worsening, persistent symptoms.  Blood pressure slightly high. Home goal <135/85. Increase losartan back to 1109m schedule follow up with Dr. PJerline Painwithin a month for recheck

## 2020-10-29 LAB — URINE CULTURE
MICRO NUMBER:: 12124708
SPECIMEN QUALITY:: ADEQUATE

## 2020-11-09 ENCOUNTER — Other Ambulatory Visit: Payer: Self-pay | Admitting: Family Medicine

## 2020-11-13 ENCOUNTER — Telehealth: Payer: Self-pay

## 2020-11-13 MED ORDER — LOSARTAN POTASSIUM 100 MG PO TABS: 100.0000 mg | ORAL_TABLET | Freq: Every day | ORAL | 5 refills | Status: DC

## 2020-11-13 NOTE — Telephone Encounter (Signed)
LAST APPOINTMENT DATE:  10/27/20  NEXT APPOINTMENT DATE:None  MEDICATION:losartan (COZAAR) 100 MG tablet  PHARMACY: [facility] [address], [place], [place] [address]

## 2020-11-13 NOTE — Telephone Encounter (Signed)
Rx refilled.

## 2020-11-20 ENCOUNTER — Telehealth: Payer: Self-pay

## 2020-11-20 NOTE — Telephone Encounter (Signed)
LAST APPOINTMENT DATE:  10/27/20  NEXT APPOINTMENT DATE:None   MEDICATION:Vitamin D, Ergocalciferol, (DRISDOL) 1.25 MG (50000 UNIT) CAPS capsule  PHARMACY:[id number] - [place], [place] - [place] [address]

## 2020-11-20 NOTE — Telephone Encounter (Signed)
I spoke with the pt to make her aware that, when Vit D levels were at normal level (40.94) when done on 07/10/2020. And to pick up OTC Vit D of at least 1000-2000/units. I referred back to the last note, but was not able to see when she was supposed to return for Vit D testing.   Please Advise.

## 2020-11-20 NOTE — Telephone Encounter (Signed)
We usually check every 6-12 months if her levels are NORMAL.  Algis Greenhouse. [name], MD 11/20/2020 2:07 PM

## 2020-11-21 NOTE — Telephone Encounter (Signed)
Patient aware last A!C and vit D was done on 06/2020 Advise to schedule appointment with PCP for F/U  Call was transfer to front office for appointment

## 2020-11-22 ENCOUNTER — Other Ambulatory Visit: Payer: Self-pay | Admitting: *Deleted

## 2020-11-22 DIAGNOSIS — E559 Vitamin D deficiency, unspecified: Secondary | ICD-10-CM

## 2020-11-22 NOTE — Telephone Encounter (Signed)
Pharmacy requesting refills.

## 2020-11-27 LAB — BASIC METABOLIC PANEL
BUN: 11 (ref 4–21)
CO2: 18 (ref 13–22)
Chloride: 110 — AB (ref 99–108)
Creatinine: 0.7 (ref 0.5–1.1)
Glucose: 103
Potassium: 3.7 (ref 3.4–5.3)

## 2020-11-27 LAB — CBC AND DIFFERENTIAL
HCT: 36 (ref 36–46)
Hemoglobin: 10.5 — AB (ref 12.0–16.0)
Neutrophils Absolute: 4.4
WBC: 6.6

## 2020-11-27 LAB — HEPATIC FUNCTION PANEL
ALT: 12 (ref 7–35)
AST: 12 — AB (ref 13–35)
Alkaline Phosphatase: 96 (ref 25–125)
Bilirubin, Total: 0.2

## 2020-11-27 LAB — COMPREHENSIVE METABOLIC PANEL
Albumin: 4.5 (ref 3.5–5.0)
Calcium: 8.6 — AB (ref 8.7–10.7)
GFR calc non Af Amer: 107
Globulin: 2

## 2020-11-27 LAB — CBC: RBC: 5.19 — AB (ref 3.87–5.11)

## 2020-11-27 MED ORDER — VITAMIN D (ERGOCALCIFEROL) 1.25 MG (50000 UNIT) PO CAPS: 50000.0000 [IU] | ORAL_CAPSULE | ORAL | 3 refills | Status: DC

## 2020-11-29 ENCOUNTER — Encounter: Payer: Self-pay | Admitting: Family Medicine

## 2020-12-11 ENCOUNTER — Telehealth: Payer: Self-pay | Admitting: *Deleted

## 2020-12-11 NOTE — Telephone Encounter (Signed)
(  Key: [id number]) Ozempic (0.25 or 0.5 MG/DOSE) 2MG/1.5ML pen-injectors Send today, waiting for determination   Message from Plan OZEMPIC 0.25 OR .5 PEN INJCTR is covered for this Member without Prior Authorization.  Pharmacy notified

## 2020-12-12 ENCOUNTER — Encounter: Payer: Self-pay | Admitting: Family Medicine

## 2020-12-14 ENCOUNTER — Other Ambulatory Visit: Payer: Self-pay

## 2020-12-14 ENCOUNTER — Telehealth: Payer: Commercial Managed Care - PPO | Admitting: Physician Assistant

## 2020-12-14 DIAGNOSIS — R110 Nausea: Secondary | ICD-10-CM

## 2020-12-14 MED ORDER — ONDANSETRON 8 MG PO TBDP: 8.0000 mg | ORAL_TABLET | Freq: Three times a day (TID) | ORAL | 0 refills | Status: DC | PRN

## 2020-12-22 ENCOUNTER — Telehealth: Payer: Self-pay

## 2020-12-22 NOTE — Telephone Encounter (Signed)
Spoke with patient. Patient aware of above information  Advise to continue with current treatment  Patient verbalized understanding

## 2020-12-22 NOTE — Telephone Encounter (Signed)
75 to 90 are not low reading -these are within the normal range.  Ok for her to continue current treatment plan until her follow up visit. If she develops any symptoms of low blood sugar would like her to let us know.  Algis Greenhouse. [name], MD 12/22/2020 4:27 PM

## 2020-12-22 NOTE — Telephone Encounter (Signed)
See below

## 2020-12-22 NOTE — Telephone Encounter (Signed)
Pt called asking for a nurse regarding her Ozempic prescription. She stated that her sugar dropped today to about 75 and after she ate it was 90. Melinda Porter is scheduled on 9/19 for Ozempic. She would like to know if she should stop taking the medication until she sees Dr [name]. Melinda Porter requested a nurse to call her back. Please Advise.

## 2020-12-25 ENCOUNTER — Ambulatory Visit: Payer: Commercial Managed Care - PPO | Admitting: Family Medicine

## 2020-12-27 ENCOUNTER — Other Ambulatory Visit: Payer: Self-pay | Admitting: Family Medicine

## 2021-01-01 ENCOUNTER — Encounter: Payer: Self-pay | Admitting: Family Medicine

## 2021-01-01 ENCOUNTER — Other Ambulatory Visit: Payer: Self-pay

## 2021-01-01 ENCOUNTER — Ambulatory Visit (INDEPENDENT_AMBULATORY_CARE_PROVIDER_SITE_OTHER): Payer: Commercial Managed Care - PPO | Admitting: Family Medicine

## 2021-01-01 DIAGNOSIS — Z23 Encounter for immunization: Secondary | ICD-10-CM | POA: Diagnosis not present

## 2021-01-01 DIAGNOSIS — R7303 Prediabetes: Secondary | ICD-10-CM

## 2021-01-01 DIAGNOSIS — I10 Essential (primary) hypertension: Secondary | ICD-10-CM | POA: Diagnosis not present

## 2021-01-01 DIAGNOSIS — F419 Anxiety disorder, unspecified: Secondary | ICD-10-CM

## 2021-01-01 DIAGNOSIS — J302 Other seasonal allergic rhinitis: Secondary | ICD-10-CM | POA: Diagnosis not present

## 2021-01-01 LAB — POCT GLYCOSYLATED HEMOGLOBIN (HGB A1C): Hemoglobin A1C: 5.3 % (ref 4.0–5.6)

## 2021-01-01 MED ORDER — SERTRALINE HCL 100 MG PO TABS: 150.0000 mg | ORAL_TABLET | Freq: Every day | ORAL | 3 refills | Status: DC

## 2021-01-01 MED ORDER — TOPIRAMATE 100 MG PO TABS: 100.0000 mg | ORAL_TABLET | Freq: Two times a day (BID) | ORAL | 1 refills | Status: DC

## 2021-01-01 MED ORDER — CLONAZEPAM 1 MG PO TABS: ORAL_TABLET | ORAL | 2 refills | Status: DC

## 2021-01-01 MED ORDER — TIRZEPATIDE 5 MG/0.5ML ~~LOC~~ SOAJ: 5.0000 mg | SUBCUTANEOUS | 0 refills | Status: DC

## 2021-01-01 NOTE — Assessment & Plan Note (Signed)
A1c improved to 5.3.  We will be switching from Ozempic to [facility] 82m.  We can recheck again in 3 to 6 months.

## 2021-01-01 NOTE — Assessment & Plan Note (Signed)
Discussed lifestyle modifications.  We will switch from Ozempic to [facility] 5 mg weekly.  She will check with me via MyChart in a few weeks and we can titrate the dose as needed.

## 2021-01-01 NOTE — Patient Instructions (Signed)
It was very nice to see you today!  Please start the mounjaro. We will star with 2m weekly. Do this for 3-4 weeks and then send me a message on mychart and we can change the dose as needed.  I will send in your other medications.  Come back to see me in 3-6 months.   Take care, Dr PJerline Pain PLEASE NOTE:  If you had any lab tests please let [place] if you have not heard back within a few days. You may see your results on mychart before we have a chance to review them but we will give you a call once they are reviewed by [place] If we ordered any referrals today, please let [place] if you have not heard from their office within the next week.   Please try these tips to maintain a healthy lifestyle:  Eat at least 3 REAL meals and 1-2 snacks per day.  Aim for no more than 5 hours between eating.  If you eat breakfast, please do so within one hour of getting up.   Each meal should contain half fruits/vegetables, one quarter protein, and one quarter carbs (no bigger than a computer mouse)  Cut down on sweet beverages. This includes juice, soda, and sweet tea.   Drink at least 1 glass of water with each meal and aim for at least 8 glasses per day  Exercise at least 150 minutes every week.

## 2021-01-01 NOTE — Assessment & Plan Note (Signed)
Refilled clonazepam and Zoloft today.  Symptoms are stable.

## 2021-01-01 NOTE — Assessment & Plan Note (Signed)
Not controlled.  Not able to take antihistamines due to Sjogren's syndrome.  Will place referral to allergist.  She did not find much benefit with Singulair and Flonase.

## 2021-01-01 NOTE — Progress Notes (Signed)
   Melinda Porter is a 50 y.o. female who presents today for an office visit.  Assessment/Plan:  New/Acute Problems: Elevated RDW Due to iron deficiency anemia.  Found at rheumatologist office.  Reassured patient.  Chronic Problems Addressed Today: Hypertension At goal.  Continue losartan 50 mg daily.  Prediabetes A1c improved to 5.3.  We will be switching from Ozempic to [facility] 29m.  We can recheck again in 3 to 6 months.   Morbid obesity ([place] Discussed lifestyle modifications.  We [facility].  She will check with me via MyChart in a few weeks and we can titrate the dose as needed.  Seasonal allergies Not controlled.  Not able to take antihistamines due to Sjogren's syndrome.  Will place referral to allergist.  She did not find much benefit with Singulair and Flonase.  Anxiety Refilled clonazepam and Zoloft today.  Symptoms are stable.  Flu shot given today    Subjective:  HPI:  Patient is here for follow-up obesity.  She was seen about 6 months ago.  Was started on Ozempic. She has noticed decreased cravings but has not noticed much weight loss on Ozempic.  She complains that she has not been happy with taking ozempic and was suggesting mounjero as a replacement medication. Reflux has been an issue with taking the ozempic, and because of this she has been taking her protonix at night. Additionally, she states that she has issues with blood sugar decreases. When it measures around 70, she becomes shaky; she states that she has had a few "episodes".  She notes that last night she had an ear ache, and had experienced a sore throat which she found unusual since she has no tonsils. The symptoms, she believes, are related to allergies.  She admits to having issues with anxiety/depression, but is currently capable of managing it well.         Objective:  Physical Exam: BP 132/82   Pulse 82   Temp 98.3 F (36.8 C) (Temporal)   Ht 5'  4" (1.626 m)   Wt 247 lb 3.2 oz (112.1 kg)   LMP 06/23/2009   BMI 42.43 kg/m   Gen: No acute distress, resting comfortably CV: Regular rate and rhythm with no murmurs appreciated Pulm: Normal work of breathing, clear to auscultation bilaterally with no crackles, wheezes, or rhonchi Neuro: Grossly normal, moves all extremities Psych: Normal affect and thought content      I,Jordan Kelly,acting as a scribe for CDimas Chyle MD.,have documented all relevant documentation on the behalf of CDimas Chyle MD,as directed by  CDimas Chyle MD while in the presence of CDimas Chyle MD.  I, CDimas Chyle MD, have reviewed all documentation for this visit. The documentation on 01/01/21 for the exam, diagnosis, procedures, and orders are all accurate and complete.  Time Spent: 45 minutes of total time was spent on the date of the encounter performing the following actions: chart review prior to seeing the patient including reveiwing labs from her rheumatologist, obtaining history, performing a medically necessary exam, counseling on the treatment plan, placing orders, and documenting in our EHR.   CAlgis Greenhouse PJerline Pain MD 01/01/2021 11:14 AM

## 2021-01-01 NOTE — Assessment & Plan Note (Signed)
At goal.  Continue losartan 50 mg daily. 

## 2021-01-04 ENCOUNTER — Telehealth: Payer: Self-pay | Admitting: *Deleted

## 2021-01-04 NOTE — Telephone Encounter (Signed)
PA Key: Barbee Cough - PA Case ID: [id number] - Rx #: [id number] Drug Mounjaro 5MG/0.5ML pen-injectors Send today  Waiting for determination

## 2021-01-05 NOTE — Telephone Encounter (Signed)
Please let her know. She may be able to get a manufacturer discount.  Algis Greenhouse. [name], MD 01/05/2021 2:48 PM

## 2021-01-05 NOTE — Telephone Encounter (Signed)
MOUNJARO 5 MG/0.5ML PEN INJCTR is not covered for this Membe

## 2021-01-05 NOTE — Telephone Encounter (Signed)
Patient stared [facility] yesterday  Stated have muscle pain on upper torso under arms arms  Unsure if is side effect from [place] of flu vaccine  Advise if worsen to go to ED or urgent care  Please advise

## 2021-01-08 ENCOUNTER — Other Ambulatory Visit: Payer: Self-pay | Admitting: Internal Medicine

## 2021-01-08 NOTE — Telephone Encounter (Signed)
Noted. Agree with plan.  Algis Greenhouse. [name], MD 01/08/2021 11:07 AM

## 2021-01-25 ENCOUNTER — Other Ambulatory Visit: Payer: Self-pay | Admitting: Family Medicine

## 2021-01-25 ENCOUNTER — Telehealth: Payer: Self-pay

## 2021-01-25 DIAGNOSIS — Z1231 Encounter for screening mammogram for malignant neoplasm of breast: Secondary | ICD-10-CM

## 2021-01-25 MED ORDER — TIRZEPATIDE 7.5 MG/0.5ML ~~LOC~~ SOAJ: 7.5000 mg | SUBCUTANEOUS | 3 refills | Status: DC

## 2021-01-25 NOTE — Telephone Encounter (Signed)
Please advise 

## 2021-01-25 NOTE — Telephone Encounter (Signed)
PA has been submitted.

## 2021-01-25 NOTE — Telephone Encounter (Signed)
We can send in new dose for Mounjaro 7.61m once weekly for 4 weeks. Would like for her to check back in with [place] a few weeks.  CAlgis Greenhouse PJerline Pain MD 01/25/2021 10:09 AM

## 2021-01-25 NOTE — Telephone Encounter (Signed)
Mounjaro sent to pharmacy

## 2021-01-25 NOTE — Telephone Encounter (Signed)
Pt called regarding her Mounjaro shot. She stated that Dr [name] told her to call when she is about to take her last shot. She stated that Dr [name] has to order the 7.5 shot. Please Advise.

## 2021-01-26 ENCOUNTER — Telehealth: Payer: Self-pay | Admitting: *Deleted

## 2021-01-26 NOTE — Telephone Encounter (Signed)
Key: [id number] - Rx #: [id number] Status Sent to Plan today Drug Mounjaro 7.5MG/0.5ML pen-injectors Waiting for determination

## 2021-01-27 ENCOUNTER — Other Ambulatory Visit: Payer: Self-pay | Admitting: Family Medicine

## 2021-02-08 NOTE — Telephone Encounter (Signed)
Rx [facility]

## 2021-02-11 ENCOUNTER — Other Ambulatory Visit: Payer: Self-pay | Admitting: Family Medicine

## 2021-03-05 ENCOUNTER — Ambulatory Visit
Admission: RE | Admit: 2021-03-05 | Discharge: 2021-03-05 | Disposition: A | Discharge status: Home / Self Care | Payer: Commercial Managed Care - PPO | Source: Ambulatory Visit

## 2021-03-05 ENCOUNTER — Telehealth: Payer: Self-pay

## 2021-03-05 ENCOUNTER — Other Ambulatory Visit: Payer: Self-pay

## 2021-03-05 DIAGNOSIS — Z1231 Encounter for screening mammogram for malignant neoplasm of breast: Secondary | ICD-10-CM

## 2021-03-05 MED ORDER — PANTOPRAZOLE SODIUM 40 MG PO TBEC: 40.0000 mg | DELAYED_RELEASE_TABLET | Freq: Two times a day (BID) | ORAL | 1 refills | Status: DC

## 2021-03-05 NOTE — Telephone Encounter (Signed)
  Encourage patient to contact the pharmacy for refills or they can request refills through [place]:  01/01/21  NEXT APPOINTMENT DATE:  MEDICATION: pantoprazole (PROTONIX) 40 MG tablet  Is the patient out of medication? No   PHARMACY: [place], [place] - [address] AT [place]: Patient states that when she was taking the weight loss shot that she was getting very nauseated so she started taking two pills daily. Will be out of refills soon and is wondering if she can get an early refill.   Let patient know to contact pharmacy at the end of the day to make sure medication is ready.  Please notify patient to allow 48-72 hours to process

## 2021-03-05 NOTE — Telephone Encounter (Signed)
Noted  

## 2021-03-05 NOTE — Telephone Encounter (Signed)
See note

## 2021-03-16 ENCOUNTER — Encounter: Payer: Self-pay | Admitting: Family

## 2021-03-16 ENCOUNTER — Other Ambulatory Visit: Payer: Self-pay

## 2021-03-16 ENCOUNTER — Ambulatory Visit (INDEPENDENT_AMBULATORY_CARE_PROVIDER_SITE_OTHER): Payer: Commercial Managed Care - PPO | Admitting: Family

## 2021-03-16 VITALS — BP 139/80 | HR 92 | Temp 98.6°F

## 2021-03-16 DIAGNOSIS — T148XXA Other injury of unspecified body region, initial encounter: Secondary | ICD-10-CM | POA: Insufficient documentation

## 2021-03-16 DIAGNOSIS — M62838 Other muscle spasm: Secondary | ICD-10-CM | POA: Insufficient documentation

## 2021-03-16 MED ORDER — CYCLOBENZAPRINE HCL 10 MG PO TABS: 10.0000 mg | ORAL_TABLET | Freq: Three times a day (TID) | ORAL | 0 refills | Status: DC | PRN

## 2021-03-16 NOTE — Patient Instructions (Addendum)
It was very nice to see you today!  As discussed, I have sent a refill for your Flexeril, ok to take 2 pills at a time, but you should NOT be driving or working as can cause drowsiness, slow your reflexes, and affect decision making. Continue the Celebrex anti-inflammatory as directed.   PLEASE NOTE:  If you had any lab tests please let us know if you have not heard back within a few days. You may see your results on MyChart before we have a chance to review them but we will give you a call once they are reviewed by [place]. If we ordered any referrals today, please let us know if you have not heard from their office within the next week.   Please try these tips to maintain a healthy lifestyle:  Eat most of your calories during the day when you are active. Eliminate processed foods including packaged sweets (pies, cakes, cookies), reduce intake of potatoes, white bread, white pasta, and white rice. Look for whole grain options, oat flour or almond flour.  Each meal should contain half fruits/vegetables, one quarter protein, and one quarter carbs (no bigger than a computer mouse).  Cut down on sweet beverages. This includes juice, soda, and sweet tea. Also watch fruit intake, though this is a healthier sweet option, it still contains natural sugar! Limit to 3 servings daily.  Drink at least 1 glass of water with each meal and aim for at least 8 glasses per day  Exercise at least 150 minutes every week.

## 2021-03-16 NOTE — Progress Notes (Signed)
Subjective:     Patient ID: Melinda Porter, female    DOB: [birth year], 50 y.o.   MRN: [id number]  Chief Complaint  Patient presents with   Back Pain    back pain for 1 week taking flexeril     Back Pain  Pain She reports new onset right upper back pain. was an injury that may have caused the pain. The pain started  2 weeks ago and is staying constant. The pain does not radiate. The pain is described as aching and soreness, occurring intermittently. Symptoms are worse in the: nighttime  Aggravating factors: recumbency and sitting She has tried NSAIDs and prescription pain relievers with little relief.      Health Maintenance Due  Topic Date Due   Hepatitis C Screening  Never done   Pneumococcal Vaccine 36-44 Years old (2 - PPSV23 if available, else PCV20) 02/13/2017   COVID-19 Vaccine (2 - Janssen risk series) 08/05/2019    Past Medical History:  Diagnosis Date   Anemia    Anxiety    Anxiety    Arthritis    Back pain    Bilateral swelling of feet    Depression    Endometriosis    Fatty liver    Fibromyalgia    GERD (gastroesophageal reflux disease)    Hiatal hernia    Hyperlipemia    Hypertension    IBS (irritable bowel syndrome)    Joint pain    Lupus (HCC)    PTSD (post-traumatic stress disorder)    Vitamin D deficiency     Past Surgical History:  Procedure Laterality Date   CESAREAN SECTION  1997   FLEXIBLE SIGMOIDOSCOPY N/A 01/06/2017   Procedure: FLEXIBLE SIGMOIDOSCOPY;  Surgeon: Yetta Flock, MD;  Location: [facility];  Service: Gastroenterology;  Laterality: N/A;   OOPHORECTOMY Bilateral    PARTIAL HYSTERECTOMY  07/2009   TEMPOROMANDIBULAR JOINT SURGERY  2009   right side   TONSILLECTOMY  2009    Outpatient Medications Prior to Visit  Medication Sig Dispense Refill   clonazePAM (KLONOPIN) 1 MG tablet TAKE 1 TABLET(1 MG) BY MOUTH THREE TIMES DAILY AS NEEDED FOR ANXIETY 90 tablet 2   ferrous sulfate 324 (65 Fe) MG TBEC Take 1 tablet (325  mg total) by mouth in the morning and at bedtime. 60 tablet 6   fluticasone (FLONASE) 50 MCG/ACT nasal spray SPRAY 2 SPRAYS INTO EACH NOSTRIL EVERY DAY 48 mL 2   hydroxychloroquine (PLAQUENIL) 200 MG tablet Take 200 mg by mouth 2 (two) times daily.     losartan (COZAAR) 100 MG tablet TAKE 1 TABLET(100 MG) BY MOUTH DAILY 30 tablet 5   mesalamine (LIALDA) 1.2 g EC tablet TAKE 4 TABLETS BY MOUTH DAILY WITH BREAKFAST 360 tablet 1   montelukast (SINGULAIR) 10 MG tablet Take 1 tablet (10 mg total) by mouth at bedtime. 30 tablet 3   ondansetron (ZOFRAN-ODT) 8 MG disintegrating tablet Take 1 tablet (8 mg total) by mouth every 8 (eight) hours as needed for nausea. 30 tablet 0   pantoprazole (PROTONIX) 40 MG tablet Take 1 tablet (40 mg total) by mouth 2 (two) times daily. 180 tablet 1   sertraline (ZOLOFT) 100 MG tablet Take 1.5 tablets (150 mg total) by mouth daily. 135 tablet 3   sulfaSALAzine (AZULFIDINE) 500 MG tablet Take 500 mg by mouth 2 (two) times daily.     topiramate (TOPAMAX) 100 MG tablet Take 1 tablet (100 mg total) by mouth 2 (two) times daily. 180 tablet  1   valACYclovir (VALTREX) 1000 MG tablet Take 2 tablets (2,000 mg total) by mouth every 12 (twelve) hours. For two doses for cold sores. 20 tablet 0   Vitamin D, Ergocalciferol, (DRISDOL) 1.25 MG (50000 UNIT) CAPS capsule Take 1 capsule (50,000 Units total) by mouth every 7 (seven) days. 12 capsule 3   cyclobenzaprine (FLEXERIL) 10 MG tablet Take 1 tablet (10 mg total) by mouth 3 (three) times daily as needed for muscle spasms. 30 tablet 0   tirzepatide (MOUNJARO) 5 MG/0.5ML Pen Inject 5 mg into the skin once a week. 2 mL 0   tirzepatide (MOUNJARO) 7.5 MG/0.5ML Pen Inject 7.5 mg into the skin once a week. 6 mL 3   TREMFYA 100 MG/ML SOPN Inject into the skin.     No facility-administered medications prior to visit.    Allergies  Allergen Reactions   Lyrica [Pregabalin] Shortness Of Breath   Ciprofloxacin Nausea Only   Gabapentin      Stroke like symptoms   Nsaids Other (See Comments)    Liver problems- can't take tylenol, ibuprofen etc.   Remicade [Infliximab]    Tolmetin     Other reaction(s): Other (See Comments) Liver problems- can't take tylenol, ibuprofen etc.   Codeine Rash   Humira [Adalimumab] Rash    Worsening of Lupus rash.   Morphine And Related Rash    "Wired up"   Penicillins Rash    Has patient had a PCN reaction causing immediate rash, facial/tongue/throat swelling, SOB or lightheadedness with hypotension: NO Has patient had a PCN reaction causing severe rash involving mucus membranes or skin necrosis: NO Has patient had a PCN reaction that required hospitalization NO Has patient had a PCN reaction occurring within the last 10 years: NO If all of the above answers are "NO", then may proceed with Cephalosporin use.        Objective:    Physical Exam Vitals and nursing note reviewed.  Constitutional:      Appearance: Normal appearance. She is obese.  Cardiovascular:     Rate and Rhythm: Normal rate and regular rhythm.  Pulmonary:     Effort: Pulmonary effort is normal.     Breath sounds: Normal breath sounds.  Musculoskeletal:     Thoracic back: Spasms and tenderness present. Decreased range of motion.       Back:     Comments: Tenderness in lower right trapezius  Skin:    General: Skin is warm and dry.  Neurological:     Mental Status: She is alert.  Psychiatric:        Mood and Affect: Mood normal.        Behavior: Behavior normal.    BP 139/80 (BP Location: Left Arm, Patient Position: Sitting, Cuff Size: Large)   Pulse 92   Temp 98.6 F (37 C) (Temporal)   LMP 06/23/2009   SpO2 98%  Wt Readings from Last 3 Encounters:  01/01/21 247 lb 3.2 oz (112.1 kg)  10/27/20 250 lb 12.8 oz (113.8 kg)  08/21/20 251 lb 3.2 oz (113.9 kg)       Assessment & Plan:   Problem List Items Addressed This Visit       Other   Muscle spasm - Primary    Right lower trapezius. Started almost  2 weeks ago after holding arm & shoulder in awkward position for her mammogram. Taking Flexeril 3m tid and restarted her Celebrex last evening. Advised to continue meds, ok to increase Flexeril to 2 pills tid  as long as not driving and not working, apply heat or analgesic patch to area tid.      Relevant Medications   cyclobenzaprine (FLEXERIL) 10 MG tablet    Meds ordered this encounter  Medications   cyclobenzaprine (FLEXERIL) 10 MG tablet    Sig: Take 1-2 tablets (10-20 mg total) by mouth 3 (three) times daily as needed for muscle spasms.    Dispense:  60 tablet    Refill:  0    Order Specific Question:   Supervising Provider    Answer:   ANDY, CAMILLE L [[id number]]

## 2021-03-16 NOTE — Assessment & Plan Note (Signed)
Right lower trapezius. Started almost 2 weeks ago after holding arm & shoulder in awkward position for her mammogram. Taking Flexeril 8m tid and restarted her Celebrex last evening. Advised to continue meds, ok to increase Flexeril to 2 pills tid as long as not driving and not working, apply heat or analgesic patch to area tid.

## 2021-03-19 ENCOUNTER — Encounter: Payer: Self-pay | Admitting: Family

## 2021-03-19 ENCOUNTER — Telehealth: Payer: Self-pay

## 2021-03-19 NOTE — Progress Notes (Unsigned)
Pt called stating that she came in on Friday for back pain. Pt stated that the pain has moved to her chest on the right side. Pt stated that she also has a swollen gland in her neck on the side that she has had a tooth pulled in past. Pt stated that she had an UTI recently still has the antibiotics for it. Pt wants to know if she can take those antibiotics. Pt was transferred to triage due to chest pain.

## 2021-03-19 NOTE — Telephone Encounter (Signed)
Nurse Assessment Nurse: Claiborne Billings, RN, Kim Date/Time (Eastern Time): 03/19/2021 9:17:03 AM Confirm and document reason for call. If symptomatic, describe symptoms. ---Caller states she was seen in the office on Friday for back pain, on Saturday the back pain radiated forward into her chest. States she also has swelling in the right side of her jaw/mouth and the gland below her right jaw is swollen. States the chest pain feels like a constant burning pain, currently 4-5/10, and is primarily in the right side of chest to under the right arm.  Does the patient have any new or worsening symptoms? ---Yes Will a triage be completed? ---Yes Related visit to physician within the last 2 weeks? ---Yes Does the PT have any chronic conditions? (i.e. diabetes, asthma, this includes High risk factors for pregnancy, etc.) ---Yes List chronic conditions. ---Lupus, Psoriatic arthritis, fibromyalgia, Sjogrens syndrome Is the patient pregnant or possibly pregnant? (Ask all females between the ages of 82-55) ---No Is this a behavioral health or substance abuse call? ---No  Chest Pain  [1] Chest pain lasts > 5 minutes AND [2] age > 11 Suzette Battiest 03/19/2021 9:21:49 AM  03/19/2021 9:14:50 AM Send to Urgent Micheal Likens 03/19/2021 9:32:29 AM 911 Outcome Documentation Claiborne Billings RN, Kim Reason: Christel Mormon 03/19/2021 9:23:33 AM Call EMS 911 Now Yes Claiborne Billings, RN, Max Sane refuses to call EMS 911, states she doesn't go to the ER because she has so many auto immune conditions and doesn't want "to catch anything". Nurse called backline per directive instructions and provided her information to [facility] (?sp); caller informed she should expect a call back shortly.

## 2021-03-19 NOTE — Telephone Encounter (Signed)
Error

## 2021-03-19 NOTE — Telephone Encounter (Signed)
Received a call from [place] from [employer] advising patient called with chest pain, Jaw pain and back pain. Kim advised patient to go to ED.Patient refused and asked to speak to the office nurse or provider. Patient stated did not want to go to ED and get COVID

## 2021-03-19 NOTE — Telephone Encounter (Signed)
Called patient stated have mouth pain and back pain and chest pain on Rt side. Notice lymph node swelling on rt side of neck  taking Rx cephalexin 513m 4 time per day  It was prescribed to her on the past and never took it.  Advise to go to ED if Sx worsen

## 2021-03-20 ENCOUNTER — Telehealth: Payer: Commercial Managed Care - PPO | Admitting: Family Medicine

## 2021-03-20 NOTE — Telephone Encounter (Signed)
Offered the patient a virtual appt that was all that was left at any of our locations. Patient stated she was ok with waiting till 03/21/21 for a in office appt.

## 2021-03-20 NOTE — Telephone Encounter (Signed)
Spoke with patient stated not having chest pain now OK to schedule appointment with any available provider today   Please schedule patient

## 2021-03-20 NOTE — Telephone Encounter (Signed)
Believe your chest pain is related to the back pain due to starting after the Mammogram test, do not believe related to your heart, but you would need further assessment if concerned about your heart. I would not start antibiotics from home for a swollen gland related to an infected tooth. This needs to be looked at by a dentist!  Thanks

## 2021-03-20 NOTE — Telephone Encounter (Signed)
Looks like she refused to go to the ED. She needs to be evaluated ASAP.  Algis Greenhouse. [name], MD 03/20/2021 7:56 AM

## 2021-03-21 ENCOUNTER — Encounter: Payer: Self-pay | Admitting: Family

## 2021-03-21 ENCOUNTER — Other Ambulatory Visit: Payer: Self-pay

## 2021-03-21 ENCOUNTER — Ambulatory Visit (INDEPENDENT_AMBULATORY_CARE_PROVIDER_SITE_OTHER): Payer: Commercial Managed Care - PPO | Admitting: Family

## 2021-03-21 VITALS — BP 140/86 | HR 75 | Temp 98.2°F | Ht 64.0 in | Wt 248.4 lb

## 2021-03-21 DIAGNOSIS — R220 Localized swelling, mass and lump, head: Secondary | ICD-10-CM | POA: Diagnosis not present

## 2021-03-21 DIAGNOSIS — M62838 Other muscle spasm: Secondary | ICD-10-CM | POA: Diagnosis not present

## 2021-03-21 DIAGNOSIS — K047 Periapical abscess without sinus: Secondary | ICD-10-CM | POA: Insufficient documentation

## 2021-03-21 NOTE — Assessment & Plan Note (Addendum)
Pt still having pain despite Flexeril & Celebrex qd. Refusing PT.  Has not used heat due to living alone and no one to apply patches or heat patch, advised using a heating pad and leaning against a chair, pillow, or use in bed with an automatic shut off.

## 2021-03-21 NOTE — Progress Notes (Signed)
Subjective:     Patient ID: Melinda Porter, female    DOB: [birth year], 50 y.o.   MRN: [id number]  Chief Complaint  Patient presents with   Jaw Pain   Swollen glands    Pt says that since being seen on Monday and taking antibiotics the swelling has gone down. But is still visibly swollen.    Back Pain    HPI Pain She reports new onset right upper back pain. was an injury that may have caused the pain. The pain started 2 weeks ago and is staying constant. The pain does not radiate. The pain is described as aching and soreness, occurring intermittently. Symptoms are worse in the: nighttime  Aggravating factors: recumbency and sitting She has tried NSAIDs and prescription pain relievers with little relief. Pt reports she is still having pain with taking Celebrex and Flexeril. Has not applied ice or heat. Swollen gum:  pt reports pain in right lower molar region, pt reports pain and swelling, pain with chewing, swollen right lower gland also starting several days ago. She states she started taking Keflex that she had at home for a UTI that she never started (told to switch abt for UTI) and states the swelling has gone down. Denies any draining pus, foul taste, redness, or fever.   Health Maintenance Due  Topic Date Due   Hepatitis C Screening  Never done   Pneumococcal Vaccine 56-64 Years old (2 - PPSV23 if available, else PCV20) 02/13/2017    Past Medical History:  Diagnosis Date   Anemia    Anxiety    Anxiety    Arthritis    Back pain    Bilateral swelling of feet    Depression    Endometriosis    Fatty liver    Fibromyalgia    GERD (gastroesophageal reflux disease)    Hiatal hernia    Hyperlipemia    Hypertension    IBS (irritable bowel syndrome)    Joint pain    Lupus (HCC)    PTSD (post-traumatic stress disorder)    Vitamin D deficiency     Past Surgical History:  Procedure Laterality Date   CESAREAN SECTION  1997   FLEXIBLE SIGMOIDOSCOPY N/A 01/06/2017    Procedure: FLEXIBLE SIGMOIDOSCOPY;  Surgeon: Yetta Flock, MD;  Location: [facility];  Service: Gastroenterology;  Laterality: N/A;   OOPHORECTOMY Bilateral    PARTIAL HYSTERECTOMY  07/2009   TEMPOROMANDIBULAR JOINT SURGERY  2009   right side   TONSILLECTOMY  2009    Outpatient Medications Prior to Visit  Medication Sig Dispense Refill   clonazePAM (KLONOPIN) 1 MG tablet TAKE 1 TABLET(1 MG) BY MOUTH THREE TIMES DAILY AS NEEDED FOR ANXIETY 90 tablet 2   cyclobenzaprine (FLEXERIL) 10 MG tablet Take 1-2 tablets (10-20 mg total) by mouth 3 (three) times daily as needed for muscle spasms. 60 tablet 0   fluticasone (FLONASE) 50 MCG/ACT nasal spray SPRAY 2 SPRAYS INTO EACH NOSTRIL EVERY DAY 48 mL 2   hydroxychloroquine (PLAQUENIL) 200 MG tablet Take 200 mg by mouth 2 (two) times daily.     losartan (COZAAR) 100 MG tablet TAKE 1 TABLET(100 MG) BY MOUTH DAILY 30 tablet 5   mesalamine (LIALDA) 1.2 g EC tablet TAKE 4 TABLETS BY MOUTH DAILY WITH BREAKFAST 360 tablet 1   ondansetron (ZOFRAN-ODT) 8 MG disintegrating tablet Take 1 tablet (8 mg total) by mouth every 8 (eight) hours as needed for nausea. 30 tablet 0   pantoprazole (PROTONIX) 40 MG tablet  Take 1 tablet (40 mg total) by mouth 2 (two) times daily. 180 tablet 1   sertraline (ZOLOFT) 100 MG tablet Take 1.5 tablets (150 mg total) by mouth daily. 135 tablet 3   sulfaSALAzine (AZULFIDINE) 500 MG tablet Take 500 mg by mouth 2 (two) times daily.     topiramate (TOPAMAX) 100 MG tablet Take 1 tablet (100 mg total) by mouth 2 (two) times daily. 180 tablet 1   valACYclovir (VALTREX) 1000 MG tablet Take 2 tablets (2,000 mg total) by mouth every 12 (twelve) hours. For two doses for cold sores. 20 tablet 0   Vitamin D, Ergocalciferol, (DRISDOL) 1.25 MG (50000 UNIT) CAPS capsule Take 1 capsule (50,000 Units total) by mouth every 7 (seven) days. 12 capsule 3   ferrous sulfate 324 (65 Fe) MG TBEC Take 1 tablet (325 mg total) by mouth in the morning and  at bedtime. (Patient not taking: Reported on 03/21/2021) 60 tablet 6   montelukast (SINGULAIR) 10 MG tablet Take 1 tablet (10 mg total) by mouth at bedtime. (Patient not taking: Reported on 03/21/2021) 30 tablet 3   No facility-administered medications prior to visit.    Allergies  Allergen Reactions   Lyrica [Pregabalin] Shortness Of Breath   Ciprofloxacin Nausea Only   Gabapentin     Stroke like symptoms   Nsaids Other (See Comments)    Liver problems- can't take tylenol, ibuprofen etc.   Remicade [Infliximab]    Tolmetin     Other reaction(s): Other (See Comments) Liver problems- can't take tylenol, ibuprofen etc.   Codeine Rash   Humira [Adalimumab] Rash    Worsening of Lupus rash.   Morphine And Related Rash    "Wired up"   Penicillins Rash    Has patient had a PCN reaction causing immediate rash, facial/tongue/throat swelling, SOB or lightheadedness with hypotension: NO Has patient had a PCN reaction causing severe rash involving mucus membranes or skin necrosis: NO Has patient had a PCN reaction that required hospitalization NO Has patient had a PCN reaction occurring within the last 10 years: NO If all of the above answers are "NO", then may proceed with Cephalosporin use.        Objective:    Physical Exam Vitals and nursing note reviewed.  Constitutional:      Appearance: Normal appearance.  Cardiovascular:     Rate and Rhythm: Normal rate and regular rhythm.  Pulmonary:     Effort: Pulmonary effort is normal.     Breath sounds: Normal breath sounds.  Musculoskeletal:        General: Normal range of motion.     Thoracic back: Tenderness (upper right side) present.  Skin:    General: Skin is warm and dry.  Neurological:     Mental Status: She is alert.  Psychiatric:        Mood and Affect: Mood normal.        Behavior: Behavior normal.    BP 140/86   Pulse 75   Temp 98.2 F (36.8 C) (Temporal)   Ht 5' 4"  (1.626 m)   Wt 248 lb 6.4 oz (112.7 kg)    LMP 06/23/2009   SpO2 99%   BMI 42.64 kg/m  Wt Readings from Last 3 Encounters:  03/21/21 248 lb 6.4 oz (112.7 kg)  01/01/21 247 lb 3.2 oz (112.1 kg)  10/27/20 250 lb 12.8 oz (113.8 kg)       Assessment & Plan:   Problem List Items Addressed This Visit  Other   Muscle spasm - Primary    Pt still having pain despite Flexeril & Celebrex qd. Refusing PT.  Has not used heat due to living alone and no one to apply patches or heat patch, advised using a heating pad and leaning against a chair, pillow, or use in bed with an automatic shut off.      Swollen gums    Right lower molar region where wisdom teeth removed. Reports swelling and pain, started Keflex at home and it has improved. Advised ok to continue 3x/day for next 4 days, if any returning or worsening symptoms, she needs to see a dentist.

## 2021-03-21 NOTE — Assessment & Plan Note (Addendum)
Right lower molar region where wisdom teeth removed. Reports swelling and pain, started Keflex at home and it has improved. Advised ok to continue 3x/day for next 4 days, if any returning or worsening symptoms, she needs to see a dentist.

## 2021-03-21 NOTE — Telephone Encounter (Signed)
Gave message to pt below. She states that she is on her way to her Dentist office this morning. She will update if it continues.

## 2021-03-21 NOTE — Patient Instructions (Addendum)
It was very nice to see you today!  Continue the Keflex antibiotic 3 times per day until finished for your abscessed gum. Follow up with your dentist if any continued swelling or pain. Continue flexeril and celebrex meds for your back muscle spasm. Try to apply a heating pad or heat patches as able several times per day.   PLEASE NOTE:  If you had any lab tests please let us know if you have not heard back within a few days. You may see your results on MyChart before we have a chance to review them but we will give you a call once they are reviewed by [place]. If we ordered any referrals today, please let us know if you have not heard from their office within the next week.   Please try these tips to maintain a healthy lifestyle:  Eat most of your calories during the day when you are active. Eliminate processed foods including packaged sweets (pies, cakes, cookies), reduce intake of potatoes, white bread, white pasta, and white rice. Look for whole grain options, oat flour or almond flour.  Each meal should contain half fruits/vegetables, one quarter protein, and one quarter carbs (no bigger than a computer mouse).  Cut down on sweet beverages. This includes juice, soda, and sweet tea. Also watch fruit intake, though this is a healthier sweet option, it still contains natural sugar! Limit to 3 servings daily.  Drink at least 1 glass of water with each meal and aim for at least 8 glasses per day  Exercise at least 150 minutes every week.

## 2021-03-30 ENCOUNTER — Ambulatory Visit: Payer: Commercial Managed Care - PPO | Admitting: Family Medicine

## 2021-04-02 ENCOUNTER — Ambulatory Visit: Payer: Commercial Managed Care - PPO | Admitting: Family Medicine

## 2021-04-04 ENCOUNTER — Other Ambulatory Visit: Payer: Self-pay | Admitting: Family Medicine

## 2021-04-04 DIAGNOSIS — F419 Anxiety disorder, unspecified: Secondary | ICD-10-CM

## 2021-04-04 NOTE — Telephone Encounter (Signed)
Last OV- 03/21/21 Last refill-01/01/21 Disp: 90 tabs refills Not specified.

## 2021-04-17 ENCOUNTER — Telehealth: Payer: Self-pay | Admitting: Internal Medicine

## 2021-04-17 NOTE — Telephone Encounter (Signed)
Inbound call from patient, states that she has stopped taking her mesalamine due to going to the bathroom around 8 times a day. Seeking advice if there is another medication for her. Please advise.

## 2021-04-17 NOTE — Telephone Encounter (Signed)
Pt reports she had been on mesalamine for over a year and it has stopped working as it was. She was having up to 8 bowel movements daily. She stopped taking it 2 days ago and now she is not going as often but does not feel as she is going enough. Pt scheduled to see Dr. Henrene Pastor 04/25/21 at 3:40pm. Pt aware of appt.

## 2021-04-23 ENCOUNTER — Telehealth: Payer: Self-pay | Admitting: *Deleted

## 2021-04-23 ENCOUNTER — Other Ambulatory Visit: Payer: Self-pay

## 2021-04-23 ENCOUNTER — Ambulatory Visit: Payer: Commercial Managed Care - PPO | Admitting: Family Medicine

## 2021-04-23 ENCOUNTER — Encounter: Payer: Self-pay | Admitting: Family Medicine

## 2021-04-23 VITALS — BP 166/87 | HR 92 | Temp 98.3°F | Ht 64.0 in | Wt 250.6 lb

## 2021-04-23 DIAGNOSIS — I10 Essential (primary) hypertension: Secondary | ICD-10-CM | POA: Diagnosis not present

## 2021-04-23 DIAGNOSIS — R221 Localized swelling, mass and lump, neck: Secondary | ICD-10-CM

## 2021-04-23 DIAGNOSIS — R7303 Prediabetes: Secondary | ICD-10-CM | POA: Diagnosis not present

## 2021-04-23 LAB — COMPREHENSIVE METABOLIC PANEL
ALT: 12 U/L (ref 0–35)
AST: 14 U/L (ref 0–37)
Albumin: 4.3 g/dL (ref 3.5–5.2)
Alkaline Phosphatase: 72 U/L (ref 39–117)
BUN: 8 mg/dL (ref 6–23)
CO2: 25 mEq/L (ref 19–32)
Calcium: 8.8 mg/dL (ref 8.4–10.5)
Chloride: 107 mEq/L (ref 96–112)
Creatinine, Ser: 0.58 mg/dL (ref 0.40–1.20)
GFR: 106.74 mL/min (ref >60.00)
Glucose, Bld: 105 mg/dL — ABNORMAL HIGH (ref 70–99)
Potassium: 3.4 mEq/L — ABNORMAL LOW (ref 3.5–5.1)
Sodium: 140 mEq/L (ref 135–145)
Total Bilirubin: 0.3 mg/dL (ref 0.2–1.2)
Total Protein: 6.7 g/dL (ref 6.0–8.3)

## 2021-04-23 LAB — CBC
HCT: 33 % — ABNORMAL LOW (ref 36.0–46.0)
Hemoglobin: 10.1 g/dL — ABNORMAL LOW (ref 12.0–15.0)
MCHC: 30.5 g/dL (ref 30.0–36.0)
MCV: 66.2 fl — ABNORMAL LOW (ref 78.0–100.0)
Platelets: 152 10*3/uL (ref 150.0–400.0)
RBC: 4.98 Mil/uL (ref 3.87–5.11)
RDW: 18.7 % — ABNORMAL HIGH (ref 11.5–15.5)
WBC: 7.8 10*3/uL (ref 4.0–10.5)

## 2021-04-23 LAB — POCT GLYCOSYLATED HEMOGLOBIN (HGB A1C): Hemoglobin A1C: 5.5 % (ref 4.0–5.6)

## 2021-04-23 LAB — TSH: TSH: 2.62 u[IU]/mL (ref 0.35–5.50)

## 2021-04-23 MED ORDER — SAXENDA 18 MG/3ML ~~LOC~~ SOPN: PEN_INJECTOR | SUBCUTANEOUS | 1 refills | Status: DC

## 2021-04-23 NOTE — Assessment & Plan Note (Signed)
Could not tolerate Mounjaro but did well with Ozempic.  We will start Saxenda.  Discussed potential side effects.  She will follow-up in a few weeks via MyChart.  Follow-up for in person office visit in 3 months.

## 2021-04-23 NOTE — Assessment & Plan Note (Signed)
Above goal though typically well controlled on losartan 100 mg daily.  She will continue home monitoring and let us know if persistently elevated.

## 2021-04-23 NOTE — Telephone Encounter (Signed)
Spoke with pt and informed her that rx was not covered and to contact insurance to see what is on her formulary.

## 2021-04-23 NOTE — Patient Instructions (Signed)
It was very nice to see you today!  We will check blood work.  We will get an ultrasound of your neck.  Please start the Saxenda.  Send [place] a message in a few weeks to let me know how this is working for you.  I would like to see you back in about 3 months for follow-up.  Take care, Dr [name]  PLEASE NOTE:  If you had any lab tests please let us know if you have not heard back within a few days. You may see your results on mychart before we have a chance to review them but we will give you a call once they are reviewed by [place]. If we ordered any referrals today, please let us know if you have not heard from their office within the next week.   Please try these tips to maintain a healthy lifestyle:  Eat at least 3 REAL meals and 1-2 snacks per day.  Aim for no more than 5 hours between eating.  If you eat breakfast, please do so within one hour of getting up.   Each meal should contain half fruits/vegetables, one quarter protein, and one quarter carbs (no bigger than a computer mouse)  Cut down on sweet beverages. This includes juice, soda, and sweet tea.   Drink at least 1 glass of water with each meal and aim for at least 8 glasses per day  Exercise at least 150 minutes every week.

## 2021-04-23 NOTE — Telephone Encounter (Signed)
(  [facility] Maria) Rx #: [id number] Saxenda 18MG/3ML pen-injectors  Message from [place] 3 MG/0.5ML PEN INJCTR is not covered for this Member. For formulary alternatives, please view the Standard or [employer] Formulary,

## 2021-04-23 NOTE — Telephone Encounter (Signed)
Are there any alternatives? Please make sure patient is aware.  Algis Greenhouse. [name], MD 04/23/2021 2:12 PM

## 2021-04-23 NOTE — Assessment & Plan Note (Signed)
A1c 5.5.  Recheck in 3 to 6 months.  We will be starting Saxenda.

## 2021-04-23 NOTE — Progress Notes (Signed)
° °  Melinda Porter is a 51 y.o. female who presents today for an office visit.  Assessment/Plan:  New/Acute Problems: Neck Fullness Could be due to Sjogren's syndrome.  Given that symptoms of been present for over 4 weeks we will check ultrasound and labs to look for other possible causes.  Eustachian Tube dysfunction She has not yet followed up with her allergist.  We will avoid antihistamines due to her Sjogren's syndrome.  Recommended she call to schedule appoint with allergist soon.  Chronic Problems Addressed Today: Hypertension Above goal though typically well controlled on losartan 100 mg daily.  She will continue home monitoring and let us know if persistently elevated.  Prediabetes A1c 5.5.  Recheck in 3 to 6 months.  We will be starting Saxenda.  Morbid obesity ([place]) Could not tolerate Mounjaro but did well with Ozempic.  We will start Saxenda.  Discussed potential side effects.  She will follow-up in a few weeks via MyChart.  Follow-up for in person office visit in 3 months.     Subjective:  HPI:  See A/P for status of chronic conditions.  Patient was seen a few months ago.  She was on Ozempic at that time.  We switched her over to [facility].  She had quite a bit of nausea and vomiting with this and she had to stop taking it.  She did well with Ozempic in the past but felt like it was not strong enough to curb her appetite.  She is interested in possibly starting Saxenda.  She has had ongoing issues with neck swelling for the last month or so.  Still has a lot of ear congestion.  No pain.  She feels like her glands are swollen.  Has had a lot of ear pain and pressure.        Objective:  Physical Exam: BP (!) 166/87    Pulse 92    Temp 98.3 F (36.8 C) (Temporal)    Ht 5' 4"  (1.626 m)    Wt 250 lb 9.6 oz (113.7 kg)    LMP 06/23/2009    SpO2 100%    BMI 43.02 kg/m   Wt Readings from Last 3 Encounters:  04/23/21 250 lb 9.6 oz (113.7 kg)  03/21/21 248 lb 6.4 oz (112.7  kg)  01/01/21 247 lb 3.2 oz (112.1 kg)  Gen: No acute distress, resting comfortably HEENT: Fullness in anterior neck CV: Regular rate and rhythm with no murmurs appreciated Pulm: Normal work of breathing, clear to auscultation bilaterally with no crackles, wheezes, or rhonchi Neuro: Grossly normal, moves all extremities Psych: Normal affect and thought content       [name] M. [name], MD 04/23/2021 10:38 AM

## 2021-04-24 ENCOUNTER — Encounter: Payer: Self-pay | Admitting: Family Medicine

## 2021-04-25 ENCOUNTER — Ambulatory Visit: Payer: Commercial Managed Care - PPO | Admitting: Internal Medicine

## 2021-04-25 ENCOUNTER — Telehealth: Payer: Self-pay

## 2021-04-25 ENCOUNTER — Other Ambulatory Visit (INDEPENDENT_AMBULATORY_CARE_PROVIDER_SITE_OTHER): Payer: Commercial Managed Care - PPO

## 2021-04-25 ENCOUNTER — Encounter: Payer: Self-pay | Admitting: Internal Medicine

## 2021-04-25 VITALS — BP 170/100 | HR 98 | Ht 64.0 in | Wt 251.0 lb

## 2021-04-25 DIAGNOSIS — D500 Iron deficiency anemia secondary to blood loss (chronic): Secondary | ICD-10-CM

## 2021-04-25 DIAGNOSIS — K51919 Ulcerative colitis, unspecified with unspecified complications: Secondary | ICD-10-CM

## 2021-04-25 DIAGNOSIS — R197 Diarrhea, unspecified: Secondary | ICD-10-CM

## 2021-04-25 LAB — HIGH SENSITIVITY CRP: CRP, High Sensitivity: 8.73 mg/L — ABNORMAL HIGH (ref 0.000–5.000)

## 2021-04-25 LAB — FERRITIN: Ferritin: 5.8 ng/mL — ABNORMAL LOW (ref 10.0–291.0)

## 2021-04-25 MED ORDER — DIPHENOXYLATE-ATROPINE 2.5-0.025 MG PO TABS: 1.0000 | ORAL_TABLET | Freq: Two times a day (BID) | ORAL | 2 refills | Status: AC | PRN

## 2021-04-25 MED ORDER — FERROUS SULFATE 324 (65 FE) MG PO TBEC: 324.0000 mg | DELAYED_RELEASE_TABLET | Freq: Two times a day (BID) | ORAL | 11 refills | Status: DC

## 2021-04-25 NOTE — Telephone Encounter (Signed)
No call from our office

## 2021-04-25 NOTE — Telephone Encounter (Signed)
Patient states she had a missed call from our office with no vm.  I do not see communication.  Please follow up with patient if you gave patient a call.

## 2021-04-25 NOTE — Patient Instructions (Signed)
If you are age 49 or older, your body mass index should be between 23-30. Your Body mass index is 43.08 kg/m. If this is out of the aforementioned range listed, please consider follow up with your Primary Care Provider.  If you are age 74 or younger, your body mass index should be between 19-25. Your Body mass index is 43.08 kg/m. If this is out of the aformentioned range listed, please consider follow up with your Primary Care Provider.   ________________________________________________________  The [facility] providers would like to encourage you to use [facility] to communicate with providers for non-urgent requests or questions.  Due to long hold times on the telephone, sending your provider a message by [facility] may be a faster and more efficient way to get a response.  Please allow 48 business hours for a response.  Please remember that this is for non-urgent requests.  _______________________________________________________  We have sent the following medications to your pharmacy for you to pick up at your convenience: Lomotil, Lialda, Iron  Please follow up in 2 months

## 2021-04-25 NOTE — Progress Notes (Signed)
Please inform patient of the following:  She is slightly anemic but this is stable compared to prior values. All of her other values are stable. She should continue taking her iron supplement and we can recheck again in a few months. Do not need to make any other changes to her treatment plan at this time.  We will contact her with the results of her ultrasound once we have them back.

## 2021-04-25 NOTE — Progress Notes (Signed)
HISTORY OF PRESENT ILLNESS:  Melinda Porter is a 51 y.o. female with rheumatoid arthritis, hypertension, hyperlipidemia, obesity, GERD, PTSD, and anxiety/depression.  She was evaluated in this office January 14, 2020 regarding persistent diarrhea, abdominal cramping, mucus and minor bleeding per rectum.  She underwent upper endoscopy, colonoscopy.  Previous CT scan suggested mild colitis.  Colonoscopy revealed mild patchy colitis consistent with IBD.  She was placed on Lialda 4.8 g daily.  She was seen in follow-up February 21, 2020.  She also had iron deficiency anemia.  She was told to continue Lialda and prescribed iron twice daily.  She was asked to follow-up in 2 months but did not.  She tells me that her husband died after a bout of COVID.  She tells me she is had COVID.  She tells me that she was having 3-4 mostly formed bowel movements per day and doing well until about 2 months ago.  Then she describes 15 loose bowel movements per day with some abdominal discomfort.  No bleeding.  Couple weeks ago she stopped her Lialda.  She thinks this is helped.  Currently with 6 or 7 loose bowel movements per day.  She is allergic to Humira.  She does not tolerate prednisone.  She is also on sulfasalazine per her rheumatologist.  She denies new medications.  Review of blood work from April 23, 2021 shows anemia with hemoglobin 10.1.  MCV 66.2.  Comprehensive metabolic panel revealed potassium 3.4.  Otherwise unremarkable including albumin 4.3.  Normal hemoglobin A1c.  REVIEW OF SYSTEMS:  All non-GI ROS negative unless otherwise stated in the HPI except for back pain, headaches, swollen lymph glands  Past Medical History:  Diagnosis Date   Anemia    Anxiety    Arthritis    Back pain    Bilateral swelling of feet    Depression    Endometriosis    Fatty liver    Fibromyalgia    Fibromyalgia    GERD (gastroesophageal reflux disease)    Hiatal hernia    Hyperlipemia    Hypertension    IBS (irritable  bowel syndrome)    Joint pain    Lupus (HCC)    PTSD (post-traumatic stress disorder)    Sjogren's disease ([place])    Vitamin D deficiency     Past Surgical History:  Procedure Laterality Date   CESAREAN SECTION  1997   FLEXIBLE SIGMOIDOSCOPY N/A 01/06/2017   Procedure: FLEXIBLE SIGMOIDOSCOPY;  Surgeon: Yetta Flock, MD;  Location: [facility];  Service: Gastroenterology;  Laterality: N/A;   OOPHORECTOMY Bilateral    PARTIAL HYSTERECTOMY  07/2009   TEMPOROMANDIBULAR JOINT SURGERY  2009   right side   TONSILLECTOMY  2009    Social History Melinda Porter  reports that she quit smoking about 22 years ago. Her smoking use included cigarettes. She has never used smokeless tobacco. She reports that she does not drink alcohol and does not use drugs.  family history includes Alcoholism in her father; Colon cancer in her maternal uncle; Colon polyps in her maternal grandmother; Diabetes in her maternal grandfather; Heart disease in her maternal grandfather; Hypertension in her maternal grandfather; Liver disease in her father; Lung cancer in her maternal grandmother; Thyroid disease in her mother.  Allergies  Allergen Reactions   Lyrica [Pregabalin] Shortness Of Breath   Ciprofloxacin Nausea Only   Gabapentin     Stroke like symptoms   Nsaids Other (See Comments)    Liver problems- can't take tylenol, ibuprofen etc.  Prednisone     "I get deathly ill"   Remicade [Infliximab]    Tolmetin     Other reaction(s): Other (See Comments) Liver problems- can't take tylenol, ibuprofen etc.   Codeine Rash   Humira [Adalimumab] Rash    Worsening of Lupus rash.   Morphine And Related Rash    "Wired up"   Penicillins Rash    Has patient had a PCN reaction causing immediate rash, facial/tongue/throat swelling, SOB or lightheadedness with hypotension: NO Has patient had a PCN reaction causing severe rash involving mucus membranes or skin necrosis: NO Has patient had a PCN reaction that  required hospitalization NO Has patient had a PCN reaction occurring within the last 10 years: NO If all of the above answers are "NO", then may proceed with Cephalosporin use.       PHYSICAL EXAMINATION: Vital signs: BP (!) 170/100    Pulse 98    Ht 5' 4"  (1.626 m)    Wt 251 lb (113.9 kg)    LMP 06/23/2009    SpO2 97%    BMI 43.08 kg/m   Constitutional: generally well-appearing, no acute distress Psychiatric: alert and oriented x3, cooperative Eyes: extraocular movements intact, anicteric, conjunctiva pink Mouth: oral pharynx moist, no lesions Neck: supple no lymphadenopathy Cardiovascular: heart regular rate and rhythm, no murmur Lungs: clear to auscultation bilaterally Abdomen: soft, obese, nontender, nondistended, no obvious ascites, no peritoneal signs, normal bowel sounds, no organomegaly Rectal: Omitted Extremities: no clubbing, cyanosis, or lower extremity edema bilaterally Skin: no lesions on visible extremities Neuro: No focal deficits.  Cranial nerves intact  ASSESSMENT:  1.  Ulcerative colitis.  Recent problems with increased bowel movements seem to represent flare of disease.  Intolerant to prednisone.  Allergic to Humira. 2.  Iron deficiency anemia 3.  Multiple medical problems  PLAN:  1.  Ferritin and C-reactive protein today 2.  Prescribe Lomotil 1 p.o. twice daily as needed for diarrhea 3.  Resume Lialda 4.8 g daily 4.  Prescribe iron sulfate 325 mg twice daily with meals 5.  Office follow-up in 2 months.  Contact the office in the interim for any questions or problems A total time of 30 minutes was spent preparing to see the patient, reviewing test, obtaining comprehensive history, performing medically appropriate physical examination, counseling the patient regarding her above listed issues, ordering medications, arranging follow-up, and documenting clinical information in the health record

## 2021-04-27 ENCOUNTER — Encounter: Payer: Self-pay | Admitting: Family Medicine

## 2021-04-30 NOTE — Telephone Encounter (Signed)
See note

## 2021-05-01 NOTE — Telephone Encounter (Signed)
SAXENDA 3 MG/0.5ML PEN INJCTR is not covered for this Member

## 2021-05-02 ENCOUNTER — Other Ambulatory Visit: Payer: Commercial Managed Care - PPO

## 2021-05-07 ENCOUNTER — Ambulatory Visit
Admission: RE | Admit: 2021-05-07 | Discharge: 2021-05-07 | Disposition: A | Discharge status: Home / Self Care | Payer: Commercial Managed Care - PPO | Source: Ambulatory Visit | Attending: Family Medicine | Admitting: Family Medicine

## 2021-05-07 ENCOUNTER — Other Ambulatory Visit: Payer: Self-pay

## 2021-05-07 DIAGNOSIS — R221 Localized swelling, mass and lump, neck: Secondary | ICD-10-CM

## 2021-05-09 NOTE — Progress Notes (Signed)
Please inform patient of the following:  Good news! Her ultrasound shows normal appearing lymph nodes. They could be more prominent due to her Sjogrens as we discussed at her office visit but the ultrasound did not detect any concerning signs of anything serious.   We do not need to do any further testing at this point.  Melinda Porter. [name], MD 05/09/2021 7:53 AM

## 2021-05-11 NOTE — Telephone Encounter (Signed)
Cover my meds called about patients PA. Call back number [phone]  Ref # [id number]

## 2021-05-16 ENCOUNTER — Ambulatory Visit: Payer: Commercial Managed Care - PPO | Admitting: Internal Medicine

## 2021-05-16 NOTE — Telephone Encounter (Signed)
PA APPEAL sendt today SAXENDA 3 MG/0.5ML PEN INJCTR is not covered for this Member. See note

## 2021-05-16 NOTE — Telephone Encounter (Signed)
Please let patient know.  Melinda Porter. [name], MD 05/16/2021 10:46 AM

## 2021-05-31 ENCOUNTER — Encounter: Payer: Self-pay | Admitting: Family Medicine

## 2021-05-31 ENCOUNTER — Telehealth: Payer: Self-pay

## 2021-05-31 ENCOUNTER — Encounter: Payer: Self-pay | Admitting: Internal Medicine

## 2021-06-01 ENCOUNTER — Telehealth: Payer: Self-pay

## 2021-06-01 ENCOUNTER — Other Ambulatory Visit: Payer: Self-pay | Admitting: Internal Medicine

## 2021-06-01 MED ORDER — FERROUS SULFATE 324 (65 FE) MG PO TBEC: 324.0000 mg | DELAYED_RELEASE_TABLET | Freq: Two times a day (BID) | ORAL | 3 refills | Status: DC

## 2021-06-01 MED ORDER — MESALAMINE 1.2 G PO TBEC: 4.8000 g | DELAYED_RELEASE_TABLET | Freq: Every day | ORAL | 3 refills | Status: DC

## 2021-06-01 NOTE — Telephone Encounter (Signed)
Please advise 

## 2021-06-01 NOTE — Telephone Encounter (Signed)
Sent 90 day supply of Lialda and iron

## 2021-06-01 NOTE — Telephone Encounter (Signed)
90day supply sent.

## 2021-06-01 NOTE — Telephone Encounter (Signed)
Agree with sending in 90 day supply if needed.  Algis Greenhouse. [name], MD 06/01/2021 11:06 AM

## 2021-06-10 ENCOUNTER — Other Ambulatory Visit: Payer: Self-pay | Admitting: Family Medicine

## 2021-06-13 ENCOUNTER — Encounter: Payer: Self-pay | Admitting: Family Medicine

## 2021-06-18 ENCOUNTER — Other Ambulatory Visit (INDEPENDENT_AMBULATORY_CARE_PROVIDER_SITE_OTHER): Payer: Commercial Managed Care - PPO

## 2021-06-18 ENCOUNTER — Ambulatory Visit: Payer: Commercial Managed Care - PPO | Admitting: Internal Medicine

## 2021-06-18 ENCOUNTER — Encounter: Payer: Self-pay | Admitting: Internal Medicine

## 2021-06-18 VITALS — BP 150/92 | HR 91 | Ht 64.0 in | Wt 254.0 lb

## 2021-06-18 DIAGNOSIS — K51919 Ulcerative colitis, unspecified with unspecified complications: Secondary | ICD-10-CM

## 2021-06-18 DIAGNOSIS — K219 Gastro-esophageal reflux disease without esophagitis: Secondary | ICD-10-CM

## 2021-06-18 DIAGNOSIS — D500 Iron deficiency anemia secondary to blood loss (chronic): Secondary | ICD-10-CM

## 2021-06-18 LAB — CBC WITH DIFFERENTIAL/PLATELET
Basophils Absolute: 0.1 10*3/uL (ref 0.0–0.1)
Basophils Relative: 1.5 % (ref 0.0–3.0)
Eosinophils Absolute: 0.4 10*3/uL (ref 0.0–0.7)
Eosinophils Relative: 5.9 % — ABNORMAL HIGH (ref 0.0–5.0)
HCT: 36.1 % (ref 36.0–46.0)
Hemoglobin: 11.4 g/dL — ABNORMAL LOW (ref 12.0–15.0)
Lymphocytes Relative: 23.6 % (ref 12.0–46.0)
Lymphs Abs: 1.8 10*3/uL (ref 0.7–4.0)
MCHC: 31.6 g/dL (ref 30.0–36.0)
MCV: 69.3 fl — ABNORMAL LOW (ref 78.0–100.0)
Monocytes Absolute: 0.4 10*3/uL (ref 0.1–1.0)
Monocytes Relative: 5.8 % (ref 3.0–12.0)
Neutro Abs: 4.7 10*3/uL (ref 1.4–7.7)
Neutrophils Relative %: 63.2 % (ref 43.0–77.0)
Platelets: 132 10*3/uL — ABNORMAL LOW (ref 150.0–400.0)
RBC: 5.21 Mil/uL — ABNORMAL HIGH (ref 3.87–5.11)
RDW: 22.4 % — ABNORMAL HIGH (ref 11.5–15.5)
WBC: 7.4 10*3/uL (ref 4.0–10.5)

## 2021-06-18 LAB — FERRITIN: Ferritin: 13.9 ng/mL (ref 10.0–291.0)

## 2021-06-18 NOTE — Patient Instructions (Signed)
If you are age 49 or older, your body mass index should be between 23-30. Your Body mass index is 43.6 kg/m?[name] If this is out of the aforementioned range listed, please consider follow up with your Primary Care Provider. ? ?If you are age 72 or younger, your body mass index should be between 19-25. Your Body mass index is 43.6 kg/m?[name] If this is out of the aformentioned range listed, please consider follow up with your Primary Care Provider.  ? ?________________________________________________________ ? ?The [facility] providers would like to encourage you to use [facility] to communicate with providers for non-urgent requests or questions.  Due to long hold times on the telephone, sending your provider a message by [facility] may be a faster and more efficient way to get a response.  Please allow 48 business hours for a response.  Please remember that this is for non-urgent requests.  ?_______________________________________________________ ? ?Your provider has requested that you go to the basement level for lab work before leaving today. Press "B" on the elevator. The lab is located at the first door on the left as you exit the elevator. ? ?Please follow up in 6 months ?

## 2021-06-18 NOTE — Progress Notes (Signed)
HISTORY OF PRESENT ILLNESS: ? ?Melinda Porter is a 51 y.o. female with lupus, psoriatic arthritis, hypertension, hyperlipidemia, obesity, GERD, PTSD, anxiety/depression, and ulcerative colitis diagnosed on colonoscopy October 2021.  Mild patchy disease.  She is intolerant to prednisone.  Allergic to Humira.  I last saw the patient April 25, 2021.  See that dictation.  At that time she seemed to have had a recent flare.  C-reactive protein that day was elevated.  Ferritin low.  She was prescribed iron sulfate 325 mg twice daily with meals.  She tells me that she has been compliant.  She was continued on Lialda 4.8 g daily (resumed at that time after she had discontinued the medication for a while) and prescribed Lomotil as needed.  Patient tells me that she is having problems related to her arthritis and lupus.  She has chronic fatigue which persists.  She is trying to get medication approval for her rheumatologic disease.  She tells me that she has good and bad days with her bowels.  Typically 3-4 bowel movements per day.  They may be formed or loose.  No bleeding.  No abdominal pain.  No weight loss.  She continues on pantoprazole 40 mg once daily for GERD.  She tells me this is effective.  She is also on sulfasalazine 500 mg daily per her rheumatologist.  Last hemoglobin 10.1 ? ?REVIEW OF SYSTEMS: ? ?All non-GI ROS negative unless otherwise stated in the HPI except for allergies, anxiety, arthritis, back pain, visual change, depression, fatigue, headaches, ankle edema ? ?Past Medical History:  ?Diagnosis Date  ? Anemia   ? Anxiety   ? Arthritis   ? Back pain   ? Bilateral swelling of feet   ? Depression   ? Endometriosis   ? Fatty liver   ? Fibromyalgia   ? Fibromyalgia   ? GERD (gastroesophageal reflux disease)   ? Hiatal hernia   ? Hyperlipemia   ? Hypertension   ? IBS (irritable bowel syndrome)   ? Joint pain   ? Lupus ([place])   ? PTSD (post-traumatic stress disorder)   ? Sjogren's disease ([place])   ? Vitamin D  deficiency   ? ? ?Past Surgical History:  ?Procedure Laterality Date  ? [place]  ? FLEXIBLE SIGMOIDOSCOPY N/A 01/06/2017  ? Procedure: FLEXIBLE SIGMOIDOSCOPY;  Surgeon: Yetta Flock, MD;  Location: [facility];  Service: Gastroenterology;  Laterality: N/A;  ? OOPHORECTOMY Bilateral   ? PARTIAL HYSTERECTOMY  07/2009  ? TEMPOROMANDIBULAR JOINT SURGERY  2009  ? right side  ? TONSILLECTOMY  2009  ? ? ?Social History ?Melinda Porter  reports that she quit smoking about 22 years ago. Her smoking use included cigarettes. She has never used smokeless tobacco. She reports that she does not drink alcohol and does not use drugs. ? ?family history includes Alcoholism in her father; Colon cancer in her maternal uncle; Colon polyps in her maternal grandmother; Diabetes in her maternal grandfather; Heart disease in her maternal grandfather; Hypertension in her maternal grandfather; Liver disease in her father; Lung cancer in her maternal grandmother; Thyroid disease in her mother. ? ?Allergies  ?Allergen Reactions  ? Lyrica [Pregabalin] Shortness Of Breath  ? Ciprofloxacin Nausea Only  ? Gabapentin   ?  Stroke like symptoms  ? Nsaids Other (See Comments)  ?  Liver problems- can't take tylenol, ibuprofen etc.  ? Prednisone   ?  "I get deathly ill"  ? Remicade [Infliximab]   ? Tolmetin   ?  Other reaction(s): Other (See Comments) ?Liver problems- can't take tylenol, ibuprofen etc.  ? Codeine Rash  ? Humira [Adalimumab] Rash  ?  Worsening of Lupus rash.  ? Morphine And Related Rash  ?  "Wired up"  ? Penicillins Rash  ?  Has patient had a PCN reaction causing immediate rash, facial/tongue/throat swelling, SOB or lightheadedness with hypotension: NO ?Has patient had a PCN reaction causing severe rash involving mucus membranes or skin necrosis: NO ?Has patient had a PCN reaction that required hospitalization NO ?Has patient had a PCN reaction occurring within the last 10 years: NO ?If all of the above answers are  "NO", then may proceed with Cephalosporin use.  ? ? ?  ? ?PHYSICAL EXAMINATION: ?Vital signs: BP (!) 150/92   Pulse 91   Ht 5' 4"  (1.626 m)   Wt 254 lb (115.2 kg)   LMP 06/23/2009   BMI 43.60 kg/m?   ?Constitutional: generally well-appearing, no acute distress ?Psychiatric: alert and oriented x3, cooperative ?Eyes: extraocular movements intact, anicteric, conjunctiva pink ?Mouth: oral pharynx moist, no lesions ?Neck: supple no lymphadenopathy ?Cardiovascular: heart regular rate and rhythm, no murmur ?Lungs: clear to auscultation bilaterally ?Abdomen: soft, obese, nontender, nondistended, no obvious ascites, no peritoneal signs, normal bowel sounds, no organomegaly ?Rectal: Omitted ?Extremities: no clubbing or cyanosis.  Trace lower extremity edema bilaterally ?Skin: no lesions on visible extremities save tattoos ?Neuro: No focal deficits.  Cranial nerves intact ? ?ASSESSMENT: ? ?1.  Mild ulcerative colitis diagnosed October 2021.  Seemingly stable on Lialda 4.8 g daily. ?2.  GERD.  Symptoms managed with once daily pantoprazole ?3.  Morbid obesity ?4.  Multiple medical problems ?5.  Iron deficiency anemia.  On iron replacement therapy ? ?PLAN: ? ?1.  Continue Lialda 4.8 g daily ?2.  CBC and ferritin today ?3.  Continue pantoprazole 40 mg daily ?4.  Reflux precautions with attention to weight loss ?5.  Consider hematology referral if still iron deficient on oral iron replacement therapy ?6.  Continue to work with her rheumatologist regarding her rheumatologic conditions ?7.  Routine GI office follow-up 6 months ?A total time of 30 minutes was spent preparing to see the patient, obtaining comprehensive history, reviewing laboratories, performing comprehensive physical examination, counseling and educating the patient regarding her above listed issues, ordering medications, ordering blood work, arranging follow-up, and documenting clinical information in the health record ? ? ? ?  ?

## 2021-06-22 ENCOUNTER — Other Ambulatory Visit: Payer: Self-pay

## 2021-06-22 DIAGNOSIS — D500 Iron deficiency anemia secondary to blood loss (chronic): Secondary | ICD-10-CM

## 2021-06-22 DIAGNOSIS — K51919 Ulcerative colitis, unspecified with unspecified complications: Secondary | ICD-10-CM

## 2021-06-25 ENCOUNTER — Ambulatory Visit (INDEPENDENT_AMBULATORY_CARE_PROVIDER_SITE_OTHER): Payer: Commercial Managed Care - PPO | Admitting: Family Medicine

## 2021-06-25 ENCOUNTER — Encounter: Payer: Self-pay | Admitting: Family Medicine

## 2021-06-25 VITALS — BP 150/100 | HR 90 | Temp 98.6°F | Ht 64.0 in | Wt 255.0 lb

## 2021-06-25 DIAGNOSIS — E162 Hypoglycemia, unspecified: Secondary | ICD-10-CM | POA: Diagnosis not present

## 2021-06-25 MED ORDER — GLUCOSE 4 G PO CHEW: 1.0000 | CHEWABLE_TABLET | ORAL | 1 refills | Status: AC | PRN

## 2021-06-25 NOTE — Progress Notes (Signed)
? ?Subjective:  ? ? ? Patient ID: Melinda Porter, female    DOB: 08/15/1970, 51 y.o.   MRN: [id number] ? ?Chief Complaint  ?Patient presents with  ? Blood Sugar Problem  ?  Dropped to 46 on Saturday, took a long time to get it back up, now it's staying higher than  ? ? ?HPI ?Sugar has been dropping for "awhile" few yrs.  Was on Mounjaro  and improved, but made "very sick" so stopped-1 yrs ago.   ?Has a lot of autoimmune issues. ?Getting intermitt lows-but more lately.  Nothing has changed.  Saturday, ate muffin like usu-ran errands.  Got hot dogs for lunch.  Drove home and 1 hr later felt :woozy-sugar 46. So ate mandarin oranges and only 64.  Ate tortilla w/cheese and welches fruit snacks and still woozey so 2 chocolates and 82, then felt bad again 1 hr later and was 52 so made vege soup and add rice and then 177('has never been that high")sugars up and down now. [name]   ?Ate pretzels earlier as had tried to fast, but felt bad and sugar 88 so ate fuit snacks and some better now.  Very concerned.  Has a lot of dietary limitations. ? ?Wonda Cerise and Dad w/AO.  Sister similar problems but she is very thin.  Mgpa DM ? ?Health Maintenance Due  ?Topic Date Due  ? Hepatitis C Screening  Never done  ? ? ?Past Medical History:  ?Diagnosis Date  ? Anemia   ? Anxiety   ? Arthritis   ? Back pain   ? Bilateral swelling of feet   ? Depression   ? Endometriosis   ? Fatty liver   ? Fibromyalgia   ? Fibromyalgia   ? GERD (gastroesophageal reflux disease)   ? Hiatal hernia   ? Hyperlipemia   ? Hypertension   ? IBS (irritable bowel syndrome)   ? Joint pain   ? Lupus ([place])   ? Psoriatic arthritis ([place])   ? PTSD (post-traumatic stress disorder)   ? Sjogren's disease ([place])   ? Vitamin D deficiency   ? ? ?Past Surgical History:  ?Procedure Laterality Date  ? [place]  ? FLEXIBLE SIGMOIDOSCOPY N/A 01/06/2017  ? Procedure: FLEXIBLE SIGMOIDOSCOPY;  Surgeon: Yetta Flock, MD;  Location: [facility];  Service:  Gastroenterology;  Laterality: N/A;  ? OOPHORECTOMY Bilateral   ? PARTIAL HYSTERECTOMY  07/2009  ? TEMPOROMANDIBULAR JOINT SURGERY  2009  ? right side  ? TONSILLECTOMY  2009  ? ? ?Outpatient Medications Prior to Visit  ?Medication Sig Dispense Refill  ? abatacept (ORENCIA) 250 MG injection 1000 mg    ? Ascorbic Acid (VITA-C PO) Take by mouth. 3 gummies a day    ? clonazePAM (KLONOPIN) 1 MG tablet TAKE 1 TABLET(1 MG) BY MOUTH THREE TIMES DAILY AS NEEDED FOR ANXIETY 90 tablet 3  ? cyclobenzaprine (FLEXERIL) 10 MG tablet Take 1-2 tablets (10-20 mg total) by mouth 3 (three) times daily as needed for muscle spasms. 60 tablet 0  ? diphenoxylate-atropine (LOMOTIL) 2.5-0.025 MG tablet Take 1 tablet by mouth 2 (two) times daily as needed for diarrhea or loose stools. 60 tablet 2  ? ferrous sulfate 324 (65 Fe) MG TBEC Take 1 tablet (324 mg total) by mouth in the morning and at bedtime. 180 tablet 3  ? fluticasone (FLONASE) 50 MCG/ACT nasal spray SPRAY 2 SPRAYS INTO EACH NOSTRIL EVERY DAY 48 mL 2  ? losartan (COZAAR) 100 MG tablet TAKE 1  TABLET(100 MG) BY MOUTH DAILY 30 tablet 5  ? mesalamine (LIALDA) 1.2 g EC tablet Take 4 tablets (4.8 g total) by mouth daily. 360 tablet 3  ? ondansetron (ZOFRAN-ODT) 8 MG disintegrating tablet Take 1 tablet (8 mg total) by mouth every 8 (eight) hours as needed for nausea. 30 tablet 0  ? pantoprazole (PROTONIX) 40 MG tablet TAKE 1 TABLET(40 MG) BY MOUTH TWICE DAILY 180 tablet 1  ? promethazine (PHENERGAN) 25 MG tablet 1 tablet as needed    ? sertraline (ZOLOFT) 100 MG tablet Take 1.5 tablets (150 mg total) by mouth daily. 135 tablet 3  ? sulfaSALAzine (AZULFIDINE) 500 MG tablet Take 500 mg by mouth daily.    ? topiramate (TOPAMAX) 100 MG tablet Take 1 tablet (100 mg total) by mouth 2 (two) times daily. (Patient taking differently: Take 100 mg by mouth daily.) 180 tablet 1  ? valACYclovir (VALTREX) 1000 MG tablet Take 2 tablets (2,000 mg total) by mouth every 12 (twelve) hours. For two doses for  cold sores. (Patient taking differently: Take 2,000 mg by mouth as needed. For two doses for cold sores.) 20 tablet 0  ? Vitamin D, Ergocalciferol, (DRISDOL) 1.25 MG (50000 UNIT) CAPS capsule Take 1 capsule (50,000 Units total) by mouth every 7 (seven) days. 12 capsule 3  ? ?No facility-administered medications prior to visit.  ? ? ?Allergies  ?Allergen Reactions  ? Lyrica [Pregabalin] Shortness Of Breath  ? Sulfa Antibiotics Rash and Other (See Comments)  ? Ciprofloxacin Nausea Only  ? Gabapentin Other (See Comments)  ?  Stroke like symptoms  ? Morphine Sulfate   ?  Other reaction(s): Unknown  ? Nsaids Other (See Comments)  ?  Liver problems- can't take tylenol, ibuprofen etc.  ? Prednisone Other (See Comments)  ?  "I get deathly ill"  ? Remicade [Infliximab]   ? Tolmetin   ?  Other reaction(s): Other (See Comments) ?Liver problems- can't take tylenol, ibuprofen etc.  ? Codeine Rash  ?  Other reaction(s): Unknown  ? Humira [Adalimumab] Rash  ?  Worsening of Lupus rash.  ? Morphine And Related Rash  ?  "Wired up"  ? Penicillins Rash  ?  Has patient had a PCN reaction causing immediate rash, facial/tongue/throat swelling, SOB or lightheadedness with hypotension: NO ?Has patient had a PCN reaction causing severe rash involving mucus membranes or skin necrosis: NO ?Has patient had a PCN reaction that required hospitalization NO ?Has patient had a PCN reaction occurring within the last 10 years: NO ?If all of the above answers are "NO", then may proceed with Cephalosporin use.  ? ?ROS neg/noncontributory except as noted HPI/below ?Bp usu better than here.   ?Chronic diarrhea-about 6x/day. ?A lot of chonic achiness ? ? ?   ?Objective:  ?  ? ?BP (!) 150/100   Pulse 90   Temp 98.6 ?F (37 ?C) (Temporal)   Ht 5' 4"  (1.626 m)   Wt 255 lb (115.7 kg)   LMP 06/23/2009   SpO2 98%   BMI 43.77 kg/m?  ?Wt Readings from Last 3 Encounters:  ?06/25/21 255 lb (115.7 kg)  ?06/18/21 254 lb (115.2 kg)  ?04/25/21 251 lb (113.9 kg)   ? ? ?Physical Exam  ? ?Gen: WDWN NAD MOWF ?HEENT: NCAT, conjunctiva not injected, sclera nonicteric ?NECK:  supple, no thyromegaly, no nodes, no carotid bruits ?CARDIAC: RRR, S1S2+, no murmur.  ?MSK: no gross abnormalities.  ?NEURO: A&O x3.  CN II-XII intact.  ?PSYCH: normal mood. Good eye contact ? ?   ?  Assessment & Plan:  ? ?Problem List Items Addressed This Visit   ?None ?Visit Diagnoses   ? ? Hypoglycemia    -  Primary  ? Relevant Orders  ? Comprehensive metabolic panel  ? C-peptide  ? Beta-hydroxybutyric acid  ? Cortisol  ? Insulin, Free (Bioactive)  ? ?  ? Hypoglycemia-has autoimmune disorders. A1C 5.5 2 mo ago. CT 01/10/20-no tumor.  ?insulinoma, other.  Check labs.  Glucose tabs.  Eat small meals frequently.  Refer endocrine ? ?Meds ordered this encounter  ?Medications  ? glucose 4 GM chewable tablet  ?  Sig: Chew 1 tablet (4 g total) by mouth as needed for low blood sugar.  ?  Dispense:  50 tablet  ?  Refill:  1  ? ? ?Wellington Hampshire, MD ? ?

## 2021-06-25 NOTE — Patient Instructions (Signed)
It was very nice to see you today! ? ?Referring to Endocrine ?Sent glucose tabs to pharmacy ?Eat small meals frequently ? ? ?PLEASE NOTE: ? ?If you had any lab tests please let us know if you have not heard back within a few days. You may see your results on MyChart before we have a chance to review them but we will give you a call once they are reviewed by [place]. If we ordered any referrals today, please let us know if you have not heard from their office within the next week.  ? ?Please try these tips to maintain a healthy lifestyle: ? ?Eat most of your calories during the day when you are active. Eliminate processed foods including packaged sweets (pies, cakes, cookies), reduce intake of potatoes, white bread, white pasta, and white rice. Look for whole grain options, oat flour or almond flour. ? ?Each meal should contain half fruits/vegetables, one quarter protein, and one quarter carbs (no bigger than a computer mouse). ? ?Cut down on sweet beverages. This includes juice, soda, and sweet tea. Also watch fruit intake, though this is a healthier sweet option, it still contains natural sugar! Limit to 3 servings daily. ? ?Drink at least 1 glass of water with each meal and aim for at least 8 glasses per day ? ?Exercise at least 150 minutes every week.   ?

## 2021-06-26 ENCOUNTER — Encounter: Payer: Self-pay | Admitting: Family Medicine

## 2021-06-26 ENCOUNTER — Telehealth: Payer: Self-pay | Admitting: Family Medicine

## 2021-06-26 LAB — COMPREHENSIVE METABOLIC PANEL
ALT: 14 U/L (ref 0–35)
AST: 16 U/L (ref 0–37)
Albumin: 4.6 g/dL (ref 3.5–5.2)
Alkaline Phosphatase: 78 U/L (ref 39–117)
BUN: 10 mg/dL (ref 6–23)
CO2: 19 mEq/L (ref 19–32)
Calcium: 8.9 mg/dL (ref 8.4–10.5)
Chloride: 109 mEq/L (ref 96–112)
Creatinine, Ser: 0.62 mg/dL (ref 0.40–1.20)
GFR: 104.91 mL/min (ref >60.00)
Glucose, Bld: 86 mg/dL (ref 70–99)
Potassium: 3.5 mEq/L (ref 3.5–5.1)
Sodium: 141 mEq/L (ref 135–145)
Total Bilirubin: 0.4 mg/dL (ref 0.2–1.2)
Total Protein: 6.5 g/dL (ref 6.0–8.3)

## 2021-06-26 LAB — TIQ- AMBIGUOUS ORDER

## 2021-06-26 LAB — CORTISOL: Cortisol, Plasma: 4.9 ug/dL

## 2021-06-26 NOTE — Telephone Encounter (Signed)
FYI - Pt called stated she received a message from mychart stating: we are unable to ascertain the test you desire - unclear order.- TEST : Test in question -  ?

## 2021-06-26 NOTE — Telephone Encounter (Signed)
Noted  

## 2021-06-27 ENCOUNTER — Telehealth: Payer: Self-pay

## 2021-06-27 NOTE — Telephone Encounter (Signed)
Called pt to advise and lvm to return my call  ?

## 2021-06-27 NOTE — Telephone Encounter (Signed)
Looks like Dr Cherlynn Kaiser is still waiting on all of her results to come back. Her c peptide is elevated but this is commonly seen in people with  insulin resistance or prediabetes. Recommend waiting for everything to come back before discussing next steps. In the meantime she should make sure she is eating small frequent meals and snacks (no more than an hour or two apart) if she is having issues with her blood sugar dropping. ? ?Melinda Porter. [name], MD ?06/27/2021 8:17 AM  ? ?

## 2021-06-27 NOTE — Telephone Encounter (Signed)
Pt called regarding lab results and was concerned by Mychart results. She is questioning C-Peptide and Cortisol and she is just not feeling well after this weekend and sugar dropping quickly to 44. Was seen by [facility] Monday. Wanting Dr [name] to view labs and advise.  ?

## 2021-06-27 NOTE — Telephone Encounter (Signed)
Pt called this morning, sent telephone encounter to Dr [name] to look into labs and answer pt questions regarding results.  ?

## 2021-06-27 NOTE — Telephone Encounter (Signed)
Pt returned your call. Would like a call back ?

## 2021-06-27 NOTE — Telephone Encounter (Signed)
Pt verified DOB, advised Dr [name] recommendations and pt verbalized understanding.  ?

## 2021-06-27 NOTE — Telephone Encounter (Signed)
Pt was getting upset about the back and forth calls. I advised her regarding Dr Marigene Ehlers notes. She stated she is eating more frequently and sugar seems to be good at the moment. She is asking for a call when the results come in. ?

## 2021-06-29 ENCOUNTER — Telehealth: Payer: Self-pay | Admitting: Family Medicine

## 2021-06-29 NOTE — Telephone Encounter (Signed)
Patient calling for lab results- patient was seen by dr Cherlynn Kaiser,  ?

## 2021-06-29 NOTE — Telephone Encounter (Signed)
Please see note.

## 2021-07-02 NOTE — Telephone Encounter (Signed)
Patient notified and verbalized understanding. 

## 2021-07-03 NOTE — Telephone Encounter (Signed)
Patient called inquiring about remaining labs. Patient stated that she received a message concerning the Beta-Hydro acid labs.  ?

## 2021-07-04 NOTE — Telephone Encounter (Signed)
Patient notified and verbalized understanding. 

## 2021-07-04 NOTE — Telephone Encounter (Signed)
Left message to return call 

## 2021-07-06 NOTE — Telephone Encounter (Signed)
Called [facility] and they stated that Insulin turn around time is normally 8 days, she stated that they have been behind on getting some tests done, but this one should process sometime this weekend or early next week.  ? ?Spoke to patient and informed her of above message. ?

## 2021-07-06 NOTE — Telephone Encounter (Signed)
Please advise 

## 2021-07-06 NOTE — Telephone Encounter (Signed)
Pt called concerning her remaining labs. She is wanting to know if someone can check on them and find out why they have not been received yet. Please advise ?

## 2021-07-07 LAB — BETA-HYDROXYBUTYRATE: Beta-Hydroxybutyric Acid: 0.05 mmol/L

## 2021-07-07 LAB — INSULIN, FREE (BIOACTIVE): Insulin, Free: 28.8 u[IU]/mL — ABNORMAL HIGH (ref 1.5–14.9)

## 2021-07-07 LAB — C-PEPTIDE: C-Peptide: 7.06 ng/mL — ABNORMAL HIGH (ref 0.80–3.85)

## 2021-07-09 ENCOUNTER — Telehealth: Payer: Self-pay | Admitting: Family Medicine

## 2021-07-09 NOTE — Telephone Encounter (Signed)
Pt states she spoke to Endo and their dr have not looked over her results yet. But she wouldn't even be able to get an appt until August. She is asking what she needs to do. Please advise. ?

## 2021-07-09 NOTE — Telephone Encounter (Signed)
Can we check with Lattie Haw? If not able to get in until August then we can try to have her be seen at a different endo office. ? ?Algis Greenhouse. Melinda Pain, MD ?07/09/2021 1:02 PM  ? ?

## 2021-07-09 NOTE — Telephone Encounter (Signed)
Please see note below. 

## 2021-07-11 ENCOUNTER — Encounter: Payer: Self-pay | Admitting: Family Medicine

## 2021-07-11 ENCOUNTER — Telehealth: Payer: Self-pay | Admitting: Internal Medicine

## 2021-07-11 NOTE — Telephone Encounter (Signed)
Inbound call from patient with question about when she should have her next set of labs done. States can be left on vm if she does not answer ?

## 2021-07-11 NOTE — Telephone Encounter (Signed)
The pt labs are due on 4/6.  She has been advised by leaving a detailed message on her voicemail per her request.  ?

## 2021-07-16 ENCOUNTER — Ambulatory Visit: Payer: Commercial Managed Care - PPO | Admitting: Family Medicine

## 2021-07-16 ENCOUNTER — Other Ambulatory Visit (INDEPENDENT_AMBULATORY_CARE_PROVIDER_SITE_OTHER): Payer: Commercial Managed Care - PPO

## 2021-07-16 ENCOUNTER — Other Ambulatory Visit: Payer: Self-pay

## 2021-07-16 ENCOUNTER — Encounter: Payer: Self-pay | Admitting: Family Medicine

## 2021-07-16 VITALS — BP 145/95 | HR 73 | Temp 97.9°F | Ht 64.0 in | Wt 255.8 lb

## 2021-07-16 DIAGNOSIS — D500 Iron deficiency anemia secondary to blood loss (chronic): Secondary | ICD-10-CM

## 2021-07-16 DIAGNOSIS — M3500 Sicca syndrome, unspecified: Secondary | ICD-10-CM

## 2021-07-16 DIAGNOSIS — D509 Iron deficiency anemia, unspecified: Secondary | ICD-10-CM | POA: Diagnosis not present

## 2021-07-16 DIAGNOSIS — K51919 Ulcerative colitis, unspecified with unspecified complications: Secondary | ICD-10-CM

## 2021-07-16 DIAGNOSIS — R7303 Prediabetes: Secondary | ICD-10-CM | POA: Diagnosis not present

## 2021-07-16 LAB — CBC WITH DIFFERENTIAL/PLATELET
Basophils Absolute: 0.1 10*3/uL (ref 0.0–0.1)
Basophils Relative: 1.7 % (ref 0.0–3.0)
Eosinophils Absolute: 0.3 10*3/uL (ref 0.0–0.7)
Eosinophils Relative: 3.7 % (ref 0.0–5.0)
HCT: 38.7 % (ref 36.0–46.0)
Hemoglobin: 12.2 g/dL (ref 12.0–15.0)
Lymphocytes Relative: 23.2 % (ref 12.0–46.0)
Lymphs Abs: 2 10*3/uL (ref 0.7–4.0)
MCHC: 31.6 g/dL (ref 30.0–36.0)
MCV: 70.9 fl — ABNORMAL LOW (ref 78.0–100.0)
Monocytes Absolute: 0.4 10*3/uL (ref 0.1–1.0)
Monocytes Relative: 5.2 % (ref 3.0–12.0)
Neutro Abs: 5.7 10*3/uL (ref 1.4–7.7)
Neutrophils Relative %: 66.2 % (ref 43.0–77.0)
Platelets: 136 10*3/uL — ABNORMAL LOW (ref 150.0–400.0)
RBC: 5.46 Mil/uL — ABNORMAL HIGH (ref 3.87–5.11)
RDW: 22.2 % — ABNORMAL HIGH (ref 11.5–15.5)
WBC: 8.6 10*3/uL (ref 4.0–10.5)

## 2021-07-16 LAB — LIPASE: Lipase: 55 U/L (ref 11.0–59.0)

## 2021-07-16 LAB — POCT GLYCOSYLATED HEMOGLOBIN (HGB A1C): Hemoglobin A1C: 5.7 % — AB (ref 4.0–5.6)

## 2021-07-16 LAB — FERRITIN: Ferritin: 17.4 ng/mL (ref 10.0–291.0)

## 2021-07-16 MED ORDER — WEGOVY 0.25 MG/0.5ML ~~LOC~~ SOAJ
0.2500 mg | SUBCUTANEOUS | 0 refills | Status: DC
Start: 2021-07-16 — End: 2021-07-31

## 2021-07-16 NOTE — Assessment & Plan Note (Signed)
Could be contributing to some of her glucose control issues.  We will continue management per rheumatology. ?

## 2021-07-16 NOTE — Progress Notes (Signed)
? ?  Melinda Porter is a 51 y.o. female who presents today for an office visit. ? ?Assessment/Plan:  ?New/Acute Problems: ?Leg Edema ?No red flags.  Multifactorial in setting of iron deficiency anemia and sedentary lifestyle. She likely has underlying venous insufficiency as well.  We discussed conservative measures including leg elevation, salt avoidance.  She does not wish to use compression stockings at this point. ? ?Hypoglycemia ?She has not had any severe lows like she did a few weeks ago.  She has referral to endocrinology pending.  We will recheck fasting C-peptide and insulin levels today.  It is possible that her Sjogren's syndrome could be contributing to pancreatic exocrine issues. ? ?Chronic Problems Addressed Today: ?Prediabetes ?A1c 5.7. ? ?Morbid obesity ([place]) ?She cannot tolerate Mounjaro.  Insurance would not pay for Saxenda.  She did not do well with Ozempic.  She would like to try [facility] though not sure if insurance will pay for.  We will send in today.  She will follow-up in a few weeks via MyChart. ? ?Sjogren's syndrome ([place]) ?Could be contributing to some of her glucose control issues.  We will continue management per rheumatology. ? ?Iron deficiency anemia ?Continue management per GI. ? ? ?  ?Subjective:  ?HPI: ? ?Patient here for follow-up.  We last saw her about 3 months ago.  Since her last visit she has been having intermittent issues with hypoglycemia.  She saw a different provider at this office a few weeks ago with more frequent lows.  Lowest of 46 a few weeks ago. She had to eat a lot over 3 hours to get it back up. Eventually when back up to 177.  She had labs done including insulin and C-peptide which were both elevated. ? ?Over last few weeks she still had what she would consider labile blood sugar readings.  She starts to feel symptomatic when she gets into the 80s and have to eat to bring it back up.  She is not on any diabetic medications currently.  She feels blurred vision when  she gets into the 140s. ? ?She has had a little more leg swelling last few days but this seems to be improving over the last day or so.  No reported orthopnea.  No reported chest pain or shortness of breath. ? ?See A/p for status of chronic conditions.  ? ?   ?  ?Objective:  ?Physical Exam: ?BP (!) 145/95 (BP Location: Left Arm)   Pulse 73   Temp 97.9 ?F (36.6 ?C) (Temporal)   Ht 5' 4"  (1.626 m)   Wt 255 lb 12.8 oz (116 kg)   LMP 06/23/2009   SpO2 100%   BMI 43.91 kg/m?   ?Gen: No acute distress, resting comfortably ?CV: Regular rate and rhythm with no murmurs appreciated ?Pulm: Normal work of breathing, clear to auscultation bilaterally with no crackles, wheezes, or rhonchi ?Neuro: Grossly normal, moves all extremities ?Psych: Normal affect and thought content ? ?   ? ?Algis Greenhouse. [name], MD ?07/16/2021 11:47 AM  ? ?

## 2021-07-16 NOTE — Patient Instructions (Signed)
It was very nice to see you today! ? ?We will try starting the [facility]. ? ?We will add on labs 12 months ahead of this morning.  Please send a message in a few weeks limit how you are doing.  I will see back in 3 to 6 months.  Come back sooner if needed. ? ?Take care, ?Dr [name] ? ?PLEASE NOTE: ? ?If you had any lab tests please let us know if you have not heard back within a few days. You may see your results on mychart before we have a chance to review them but we will give you a call once they are reviewed by [place]. If we ordered any referrals today, please let us know if you have not heard from their office within the next week.  ? ?Please try these tips to maintain a healthy lifestyle: ? ?Eat at least 3 REAL meals and 1-2 snacks per day.  Aim for no more than 5 hours between eating.  If you eat breakfast, please do so within one hour of getting up.  ? ?Each meal should contain half fruits/vegetables, one quarter protein, and one quarter carbs (no bigger than a computer mouse) ? ?Cut down on sweet beverages. This includes juice, soda, and sweet tea.  ? ?Drink at least 1 glass of water with each meal and aim for at least 8 glasses per day ? ?Exercise at least 150 minutes every week.   ?

## 2021-07-16 NOTE — Assessment & Plan Note (Signed)
A1c 5.7. ?

## 2021-07-16 NOTE — Assessment & Plan Note (Signed)
Continue management per GI. ?

## 2021-07-16 NOTE — Assessment & Plan Note (Signed)
She cannot tolerate Mounjaro.  Insurance would not pay for Saxenda.  She did not do well with Ozempic.  She would like to try [facility] though not sure if insurance will pay for.  We will send in today.  She will follow-up in a few weeks via MyChart. ?

## 2021-07-17 ENCOUNTER — Telehealth: Payer: Self-pay | Admitting: *Deleted

## 2021-07-17 ENCOUNTER — Other Ambulatory Visit: Payer: Self-pay

## 2021-07-17 NOTE — Progress Notes (Signed)
Please inform patient of the following: ? ?Her lipase is normal.

## 2021-07-17 NOTE — Telephone Encounter (Signed)
Spoke with patient, notified Rx Mancel Parsons was denied  ?Advise to call insurance for alternatives  ?

## 2021-07-17 NOTE — Telephone Encounter (Signed)
Do we have a list of alternatives? ? ?Algis Greenhouse. [name], MD ?07/17/2021 10:52 AM  ? ?

## 2021-07-17 NOTE — Telephone Encounter (Signed)
(  Key: [id number]) Rx #: [id number] ? ?Wegovy 0.25MG/0.5ML auto-injectorsPA Rx WEGOVY 0.25MG/0.5 PEN INJCTR is not covered for this Member. For formulary alternatives, please view the Standard or Precision Formulary, ?

## 2021-07-18 ENCOUNTER — Telehealth: Payer: Self-pay

## 2021-07-18 NOTE — Telephone Encounter (Signed)
Patient called to advise that we need to call [phone] for PA to get approval for [facility] or Saxenda approved. 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 According to patient insurance stated if we called them they would approve Saxenda or Wegovy.  ?

## 2021-07-23 ENCOUNTER — Other Ambulatory Visit: Payer: Commercial Managed Care - PPO

## 2021-07-23 ENCOUNTER — Other Ambulatory Visit (INDEPENDENT_AMBULATORY_CARE_PROVIDER_SITE_OTHER): Payer: Commercial Managed Care - PPO

## 2021-07-23 ENCOUNTER — Ambulatory Visit: Payer: Commercial Managed Care - PPO | Admitting: Family Medicine

## 2021-07-23 DIAGNOSIS — R7303 Prediabetes: Secondary | ICD-10-CM

## 2021-07-24 ENCOUNTER — Other Ambulatory Visit: Payer: Self-pay | Admitting: Family Medicine

## 2021-07-24 DIAGNOSIS — F419 Anxiety disorder, unspecified: Secondary | ICD-10-CM

## 2021-07-24 NOTE — Telephone Encounter (Signed)
Patient calling wanting updates on medication approval- would like a call back with status- said it is very important for her to start meds as she needs to lower a1c.  ?

## 2021-07-25 ENCOUNTER — Telehealth: Payer: Self-pay | Admitting: *Deleted

## 2021-07-25 NOTE — Telephone Encounter (Signed)
WEGOVY 0.25MG/0.5 PEN Kaleen Odea is not covered for this Member  ?Patient aware  ?

## 2021-07-25 NOTE — Telephone Encounter (Signed)
(  KeyCorky Downs) ?Rx #: [id number] ?Wegovy 0.25MG/0.5ML auto-injectors ?Waiting for determination  ?

## 2021-07-25 NOTE — Telephone Encounter (Signed)
Patient is requesting call back in regard at [phone].  States INS informed her that if it was noted on the PA that she can not take metformin and has tried [place] and another medication.  States these meds made her sick.  Phone call was disconnected.

## 2021-07-26 NOTE — Telephone Encounter (Signed)
Called pt and lvm with details we are waiting on a determination and still not heard back. Will let her know as soon as we get information back.  ?

## 2021-07-26 NOTE — Telephone Encounter (Signed)
Call [phone] for review, spoke with agent stated will send PA for review  ?Waiting for determination  ? ?

## 2021-07-27 ENCOUNTER — Telehealth: Payer: Self-pay

## 2021-07-27 NOTE — Telephone Encounter (Signed)
FYI: Patient calling back to check on labs.  Would like a call back as soon as they come back.

## 2021-07-28 ENCOUNTER — Other Ambulatory Visit: Payer: Self-pay | Admitting: Family Medicine

## 2021-07-30 ENCOUNTER — Other Ambulatory Visit: Payer: Self-pay | Admitting: Family Medicine

## 2021-07-30 ENCOUNTER — Telehealth: Payer: Self-pay | Admitting: Family Medicine

## 2021-07-30 NOTE — Telephone Encounter (Signed)
PA completed by calling insurance and waiting on determination. ?

## 2021-07-30 NOTE — Telephone Encounter (Signed)
Pt states she is wanting to not wait on the [facility] and go back to Ozempic. Please advise ?

## 2021-07-30 NOTE — Telephone Encounter (Signed)
Insurance did not cover Rx ("only for weight loss") requesting change. ? ?Inquiring about results, please call ASAP. ? ?.. ?Encourage patient to contact the pharmacy for refills or they can request refills through [facility] ? ?LAST APPOINTMENT DATE:  ?07/16/21 ? ?NEXT APPOINTMENT DATE: ?10/29/21 ? ?MEDICATION: ?Ozempic ? ?Is the patient out of medication?  ?Yes - Wegovy could not be filled ? ?PHARMACY: ?[facility]  ?[place], [place] [address]  ?Phone:  [phone]  Fax:  [phone]  ?DEA #:  [id number] ? ?Let patient know to contact pharmacy at the end of the day to make sure medication is ready. ? ?Please notify patient to allow 48-72 hours to process  ?

## 2021-07-30 NOTE — Telephone Encounter (Signed)
Sent patient a message in [place] and also called and left vm with office call back number to call with any questions. ?

## 2021-07-30 NOTE — Telephone Encounter (Signed)
Please see pt note, wanting to go back on Ozempic since [place] can't be approved per insurance. Please advise ?

## 2021-07-30 NOTE — Telephone Encounter (Signed)
When labs are reviewed will contact pt to advise of results  ?

## 2021-07-31 ENCOUNTER — Other Ambulatory Visit: Payer: Self-pay | Admitting: *Deleted

## 2021-07-31 ENCOUNTER — Encounter: Payer: Self-pay | Admitting: Family Medicine

## 2021-07-31 MED ORDER — OZEMPIC (0.25 OR 0.5 MG/DOSE) 2 MG/1.5ML ~~LOC~~ SOPN: 0.2500 mg | PEN_INJECTOR | SUBCUTANEOUS | 1 refills | Status: DC

## 2021-07-31 NOTE — Telephone Encounter (Signed)
Rx send to pharmacy, patient notified  ? ?Patient requesting insulin test result, please advise  ?

## 2021-07-31 NOTE — Telephone Encounter (Signed)
Ok to send in ozempic 0.5m once weekly. ? ?CAlgis Greenhouse PJerline Pain MD ?07/31/2021 12:17 PM  ? ?

## 2021-07-31 NOTE — Telephone Encounter (Signed)
Can we check with lab? Its still showing in process. ? ?Algis Greenhouse. [name], MD ?07/31/2021 1:15 PM  ? ?

## 2021-08-02 ENCOUNTER — Telehealth: Payer: Self-pay | Admitting: Family Medicine

## 2021-08-02 NOTE — Telephone Encounter (Signed)
Can you check on this please?

## 2021-08-02 NOTE — Telephone Encounter (Signed)
Please check on this test results  ?

## 2021-08-02 NOTE — Telephone Encounter (Signed)
Pt is asking for a call back regarding her Insulin lab test. It still states it is in processing. She is wanting to know why it is taking so long. Please advise ?

## 2021-08-03 ENCOUNTER — Other Ambulatory Visit: Payer: Self-pay

## 2021-08-03 ENCOUNTER — Telehealth: Payer: Self-pay

## 2021-08-03 ENCOUNTER — Encounter: Payer: Self-pay | Admitting: Family Medicine

## 2021-08-03 DIAGNOSIS — J3489 Other specified disorders of nose and nasal sinuses: Secondary | ICD-10-CM

## 2021-08-03 LAB — C-PEPTIDE: C-Peptide: 6.57 ng/mL — ABNORMAL HIGH (ref 0.80–3.85)

## 2021-08-03 LAB — INSULIN, FREE (BIOACTIVE): Insulin, Free: 29.1 u[IU]/mL — ABNORMAL HIGH (ref 1.5–14.9)

## 2021-08-03 MED ORDER — FLUTICASONE PROPIONATE 50 MCG/ACT NA SUSP: NASAL | 2 refills | Status: DC

## 2021-08-03 NOTE — Telephone Encounter (Signed)
Patient is requesting call back with status update on ozempic PA.   States pharmacy should have send PA over to be processed.

## 2021-08-03 NOTE — Progress Notes (Signed)
Please inform patient of the following: ? ?She should continue with what we discussed at her visit and eat every 3-4 hours while awake. ? ?Algis Greenhouse. [name], MD ?08/03/2021 12:22 PM  ?

## 2021-08-03 NOTE — Telephone Encounter (Signed)
Melinda Porter (Key: [id number]) ?Rx #: [id number] ?Ozempic (0.25 or 0.5 MG/DOSE) 2MG/3ML pen-injectors ?  ?Form ?[place] Rx (MRx) ? ?PA Denied called pt to advise and to contact insurance with any questions or concerns. ?

## 2021-08-03 NOTE — Progress Notes (Signed)
Please inform patient of the following: ? ?Her insulin levels are still elevated. We need to have her see endocrinology ASAP. Can we check on the status of her referral? ? ?Karina Nofsinger M. [name], MD ?08/03/2021 8:03 AM  ?

## 2021-08-03 NOTE — Telephone Encounter (Signed)
..   Encourage patient to contact the pharmacy for refills or they can request refills through [facility]:  07/16/2021  NEXT APPOINTMENT DATE: na  MEDICATION: flonase  Is the patient out of medication?   PHARMACY:  [facility] [address]   Let patient know to contact pharmacy at the end of the day to make sure medication is ready.  Please notify patient to allow 48-72 hours to process

## 2021-08-03 NOTE — Telephone Encounter (Signed)
Rx sent to pharmacy   

## 2021-08-06 NOTE — Telephone Encounter (Signed)
Pt stated :  ? ?My insurance company said the form for Ozempic wasn?t filled out like the first form. That the first form said I had previously tried metformin and was type 2. That?s why it was denied because it wasn?t filled out correctly.  ? ? ?Pt would like office to correct and address the following so that she can be approved for ozempic -  ?

## 2021-08-06 NOTE — Telephone Encounter (Signed)
PA Denial: Not Medically Necessary ?Patient notified. ?Unable to change Dx patient not diabetic  ?

## 2021-08-06 NOTE — Telephone Encounter (Signed)
(  Key: [id number]) ?Ozempic (0.25 or 0.5 MG/DOSE) 2MG/3ML pen-injectors ?PA was resend, with Pre diabetic Dx.  ?

## 2021-08-06 NOTE — Telephone Encounter (Signed)
Test has been processed and patient made aware of results  ?

## 2021-08-06 NOTE — Telephone Encounter (Signed)
See previews note  ?

## 2021-08-10 ENCOUNTER — Encounter: Payer: Self-pay | Admitting: Family Medicine

## 2021-08-13 NOTE — Telephone Encounter (Signed)
Referral was placed 

## 2021-08-25 ENCOUNTER — Other Ambulatory Visit: Payer: Self-pay | Admitting: Family Medicine

## 2021-08-25 DIAGNOSIS — F419 Anxiety disorder, unspecified: Secondary | ICD-10-CM

## 2021-09-18 ENCOUNTER — Encounter: Payer: Self-pay | Admitting: Family Medicine

## 2021-09-18 ENCOUNTER — Telehealth (INDEPENDENT_AMBULATORY_CARE_PROVIDER_SITE_OTHER): Payer: Commercial Managed Care - PPO | Admitting: Family Medicine

## 2021-09-18 VITALS — Ht 64.0 in | Wt 254.0 lb

## 2021-09-18 DIAGNOSIS — M3500 Sicca syndrome, unspecified: Secondary | ICD-10-CM | POA: Diagnosis not present

## 2021-09-18 DIAGNOSIS — M3219 Other organ or system involvement in systemic lupus erythematosus: Secondary | ICD-10-CM | POA: Diagnosis not present

## 2021-09-18 DIAGNOSIS — B001 Herpesviral vesicular dermatitis: Secondary | ICD-10-CM

## 2021-09-18 DIAGNOSIS — J069 Acute upper respiratory infection, unspecified: Secondary | ICD-10-CM | POA: Diagnosis not present

## 2021-09-18 MED ORDER — VALACYCLOVIR HCL 1 G PO TABS: 2000.0000 mg | ORAL_TABLET | ORAL | 0 refills | Status: DC | PRN

## 2021-09-18 NOTE — Progress Notes (Signed)
Virtual Visit via Video Note  Subjective  CC:  Chief Complaint  Patient presents with   Cough    Pt stated that she started feeling bad 09/16/2021 and then she started coughing yesterday and got worse this morning. COVID test was done this morning and it was Negative.      I connected with Kenlyn S Gatlin on 09/18/21 at  9:30 AM EDT by a video enabled telemedicine application and verified that I am speaking with the correct person using two identifiers. Location patient: Home Location provider: [facility] [place], Office Persons participating in the virtual visit: Jiayi S Cer[place], Leamon Arnt, MD Darlina Rumpf CMA Same day acute visit; PCP not available. New pt to me. Chart reviewed.   I discussed the limitations of evaluation and management by telemedicine and the availability of in person appointments. The patient expressed understanding and agreed to proceed. HPI: Melinda Porter is a 51 y.o. female who was contacted today to address the problems listed above in the chief complaint. 51 year old with multiple chronic conditions including ulcerative colitis, Sjogren's and lupus presents due to symptoms including fever blister, myalgias, dry cough, chest congestion and malaise.  Symptoms started approximately 2 days ago.  Some of the symptoms she has intermittently and chronically due to her chronic conditions.  She denies fevers.  She also has noticed eye crusting.  She did take a COVID test at home which was negative.  She denies headache, shortness of breath, pleuritic chest pain, nausea vomiting or diarrhea.  Her appetite is down.  No abdominal pain.  Assessment  1. Viral URI   2. Recurrent herpes labialis   3. Other systemic lupus erythematosus with other organ involvement ([place])   4. Sjogren's syndrome, with unspecified organ involvement ([place])      Plan  Viral uri: Most consistent with a viral URI although a flare of one of her chronic conditions or  allergies could be contributing.  I recommend supportive care.  Rest and hydration.  She will follow-up with [place] if worsening. Cold sore: valtrex Lupus and Sjogren's: No clear flares.  She will follow-up with rheumatology if worsens.  I discussed the assessment and treatment plan with the patient. The patient was provided an opportunity to ask questions and all were answered. The patient agreed with the plan and demonstrated an understanding of the instructions.   The patient was advised to call back or seek an in-person evaluation if the symptoms worsen or if the condition fails to improve as anticipated. Follow up: f/u if not improving.   10/29/2021  Meds ordered this encounter  Medications   valACYclovir (VALTREX) 1000 MG tablet    Sig: Take 2 tablets (2,000 mg total) by mouth as needed. For two doses for cold sores.    Dispense:  20 tablet    Refill:  0      I reviewed the patients updated PMH, FH, and SocHx.    Patient Active Problem List   Diagnosis Date Noted   Prediabetes 01/01/2021   Morbid obesity ([place]) 07/10/2020   Depression, major, single episode, moderate ([place]) 05/29/2020   Anxiety 05/29/2020   PTSD (post-traumatic stress disorder) 05/29/2020   Vitamin D deficiency 05/29/2020   Iron deficiency anemia 05/29/2020   GERD (gastroesophageal reflux disease) 05/29/2020   Sjogren's syndrome ([place]) 05/29/2020   Seasonal allergies 05/29/2020   Vitamin B12 deficiency 05/29/2020   Cold sore 05/29/2020   Systemic lupus erythematosus ([place]) 12/29/2019  Ulcerative colitis ([place]) 01/05/2017   Persistent headaches 11/05/2014   Facial droop 11/05/2014   Syncope 11/05/2014   Hypertension 11/05/2014   Hyperlipemia 11/05/2014   Psoriatic arthritis ([place]) 11/05/2014   Current Meds  Medication Sig   abatacept (ORENCIA) 250 MG injection 1000 mg   Ascorbic Acid (VITA-C PO) Take by mouth. 3 gummies a day   clonazePAM (KLONOPIN) 1 MG tablet TAKE 1 TABLET(1 MG) BY MOUTH THREE TIMES DAILY  AS NEEDED FOR ANXIETY   cyclobenzaprine (FLEXERIL) 10 MG tablet Take 1-2 tablets (10-20 mg total) by mouth 3 (three) times daily as needed for muscle spasms.   diphenoxylate-atropine (LOMOTIL) 2.5-0.025 MG tablet Take 1 tablet by mouth 2 (two) times daily as needed for diarrhea or loose stools.   ferrous sulfate 324 (65 Fe) MG TBEC Take 1 tablet (324 mg total) by mouth in the morning and at bedtime.   fluticasone (FLONASE) 50 MCG/ACT nasal spray SPRAY 2 SPRAYS INTO EACH NOSTRIL EVERY DAY   glucose 4 GM chewable tablet Chew 1 tablet (4 g total) by mouth as needed for low blood sugar.   losartan (COZAAR) 100 MG tablet TAKE 1 TABLET(100 MG) BY MOUTH DAILY   mesalamine (LIALDA) 1.2 g EC tablet Take 4 tablets (4.8 g total) by mouth daily.   ondansetron (ZOFRAN-ODT) 8 MG disintegrating tablet Take 1 tablet (8 mg total) by mouth every 8 (eight) hours as needed for nausea.   pantoprazole (PROTONIX) 40 MG tablet TAKE 1 TABLET(40 MG) BY MOUTH TWICE DAILY   Potassium 95 MG TABS Take by mouth.   promethazine (PHENERGAN) 25 MG tablet 1 tablet as needed   sertraline (ZOLOFT) 100 MG tablet Take 1.5 tablets (150 mg total) by mouth daily.   sulfaSALAzine (AZULFIDINE) 500 MG tablet Take 500 mg by mouth daily.   topiramate (TOPAMAX) 100 MG tablet TAKE 1 TABLET(100 MG) BY MOUTH TWICE DAILY   Vitamin D, Ergocalciferol, (DRISDOL) 1.25 MG (50000 UNIT) CAPS capsule Take 1 capsule (50,000 Units total) by mouth every 7 (seven) days.   [DISCONTINUED] valACYclovir (VALTREX) 1000 MG tablet Take 2 tablets (2,000 mg total) by mouth every 12 (twelve) hours. For two doses for cold sores. (Patient taking differently: Take 2,000 mg by mouth as needed. For two doses for cold sores.)    Allergies: Patient is allergic to lyrica [pregabalin], sulfa antibiotics, ciprofloxacin, gabapentin, morphine sulfate, nsaids, prednisone, remicade [infliximab], tolmetin, codeine, humira [adalimumab], morphine and related, and penicillins. Family  History: Patient family history includes Alcoholism in her father; Colon cancer in her maternal uncle; Colon polyps in her maternal grandmother; Diabetes in her maternal grandfather; Heart disease in her maternal grandfather; Hypertension in her maternal grandfather; Liver disease in her father; Lung cancer in her maternal grandmother; Thyroid disease in her mother. Social History:  Patient  reports that she quit smoking about 22 years ago. Her smoking use included cigarettes. She has never used smokeless tobacco. She reports that she does not drink alcohol and does not use drugs.  Review of Systems: Constitutional: Negative for fever malaise or anorexia Cardiovascular: negative for chest pain Respiratory: negative for SOB or persistent cough Gastrointestinal: negative for abdominal pain  OBJECTIVE Vitals: Ht 5' 4"  (1.626 m)   Wt 254 lb (115.2 kg)   LMP 06/23/2009   BMI 43.60 kg/m  General: no acute distress , A&Ox3, nontoxic-appearing, appears tired Large fever blister on left lower lip, no respiratory distress.  Leamon Arnt, MD

## 2021-09-27 ENCOUNTER — Other Ambulatory Visit (INDEPENDENT_AMBULATORY_CARE_PROVIDER_SITE_OTHER): Payer: Commercial Managed Care - PPO

## 2021-09-27 DIAGNOSIS — D500 Iron deficiency anemia secondary to blood loss (chronic): Secondary | ICD-10-CM

## 2021-09-27 LAB — CBC WITH DIFFERENTIAL/PLATELET
Basophils Absolute: 0.1 10*3/uL (ref 0.0–0.1)
Basophils Relative: 1.5 % (ref 0.0–3.0)
Eosinophils Absolute: 0.3 10*3/uL (ref 0.0–0.7)
Eosinophils Relative: 3.3 % (ref 0.0–5.0)
HCT: 41.2 % (ref 36.0–46.0)
Hemoglobin: 13.2 g/dL (ref 12.0–15.0)
Lymphocytes Relative: 18.6 % (ref 12.0–46.0)
Lymphs Abs: 1.8 10*3/uL (ref 0.7–4.0)
MCHC: 31.9 g/dL (ref 30.0–36.0)
MCV: 75 fl — ABNORMAL LOW (ref 78.0–100.0)
Monocytes Absolute: 0.5 10*3/uL (ref 0.1–1.0)
Monocytes Relative: 5.3 % (ref 3.0–12.0)
Neutro Abs: 7.1 10*3/uL (ref 1.4–7.7)
Neutrophils Relative %: 71.3 % (ref 43.0–77.0)
Platelets: 106 10*3/uL — ABNORMAL LOW (ref 150.0–400.0)
RBC: 5.5 Mil/uL — ABNORMAL HIGH (ref 3.87–5.11)
RDW: 18.6 % — ABNORMAL HIGH (ref 11.5–15.5)
WBC: 9.9 10*3/uL (ref 4.0–10.5)

## 2021-09-27 LAB — FERRITIN: Ferritin: 36.2 ng/mL (ref 10.0–291.0)

## 2021-10-01 ENCOUNTER — Other Ambulatory Visit: Payer: Self-pay | Admitting: *Deleted

## 2021-10-01 MED ORDER — LOSARTAN POTASSIUM 100 MG PO TABS: ORAL_TABLET | ORAL | 5 refills | Status: DC

## 2021-10-05 ENCOUNTER — Ambulatory Visit: Payer: Commercial Managed Care - PPO | Admitting: Family Medicine

## 2021-10-09 ENCOUNTER — Ambulatory Visit: Payer: Self-pay | Admitting: Podiatry

## 2021-10-10 ENCOUNTER — Telehealth: Payer: Self-pay | Admitting: Internal Medicine

## 2021-10-10 NOTE — Telephone Encounter (Signed)
Inbound call from patient stating she is having a flare up of Ulcerative colitis. Patient is having pain in her spleen or pancreas, she is not sure which one. Patient is requesting a call back to discuss. Please advise.

## 2021-10-10 NOTE — Telephone Encounter (Signed)
Pt states she has been having pain on her right side and a few loose stools. She is not sure if she is having a flare of her colitis or not. She has not seen and blood or mucous in her stool. Reports she is taking her mesalamine. Pt scheduled to see Alonza Bogus PA 10/23/21@1 :30pm. Pt aware of appt.

## 2021-10-15 ENCOUNTER — Encounter: Payer: Self-pay | Admitting: Family Medicine

## 2021-10-15 ENCOUNTER — Ambulatory Visit (INDEPENDENT_AMBULATORY_CARE_PROVIDER_SITE_OTHER): Payer: Commercial Managed Care - PPO | Admitting: Family Medicine

## 2021-10-15 ENCOUNTER — Ambulatory Visit (HOSPITAL_BASED_OUTPATIENT_CLINIC_OR_DEPARTMENT_OTHER)
Admission: RE | Admit: 2021-10-15 | Discharge: 2021-10-15 | Disposition: A | Discharge status: Home / Self Care | Payer: Commercial Managed Care - PPO | Source: Ambulatory Visit | Attending: Family Medicine | Admitting: Family Medicine

## 2021-10-15 VITALS — BP 124/82 | HR 81 | Temp 98.3°F | Ht 64.0 in | Wt 252.0 lb

## 2021-10-15 DIAGNOSIS — R1084 Generalized abdominal pain: Secondary | ICD-10-CM | POA: Insufficient documentation

## 2021-10-15 DIAGNOSIS — R300 Dysuria: Secondary | ICD-10-CM | POA: Diagnosis not present

## 2021-10-15 LAB — POCT URINALYSIS DIPSTICK
Bilirubin, UA: NEGATIVE
Blood, UA: NEGATIVE
Glucose, UA: NEGATIVE
Ketones, UA: POSITIVE
Nitrite, UA: NEGATIVE
Protein, UA: NEGATIVE
Spec Grav, UA: 1.025 (ref 1.010–1.025)
Urobilinogen, UA: 0.2 E.U./dL
pH, UA: 5.5 (ref 5.0–8.0)

## 2021-10-15 LAB — CBC WITH DIFFERENTIAL/PLATELET
Basophils Absolute: 0.1 10*3/uL (ref 0.0–0.1)
Basophils Relative: 1 % (ref 0.0–3.0)
Eosinophils Absolute: 0.3 10*3/uL (ref 0.0–0.7)
Eosinophils Relative: 3.1 % (ref 0.0–5.0)
HCT: 37.7 % (ref 36.0–46.0)
Hemoglobin: 12 g/dL (ref 12.0–15.0)
Lymphocytes Relative: 22.2 % (ref 12.0–46.0)
Lymphs Abs: 2.1 10*3/uL (ref 0.7–4.0)
MCHC: 31.9 g/dL (ref 30.0–36.0)
MCV: 75.6 fl — ABNORMAL LOW (ref 78.0–100.0)
Monocytes Absolute: 0.5 10*3/uL (ref 0.1–1.0)
Monocytes Relative: 5.5 % (ref 3.0–12.0)
Neutro Abs: 6.4 10*3/uL (ref 1.4–7.7)
Neutrophils Relative %: 68.2 % (ref 43.0–77.0)
Platelets: 129 10*3/uL — ABNORMAL LOW (ref 150.0–400.0)
RBC: 4.99 Mil/uL (ref 3.87–5.11)
RDW: 18.3 % — ABNORMAL HIGH (ref 11.5–15.5)
WBC: 9.4 10*3/uL (ref 4.0–10.5)

## 2021-10-15 LAB — COMPREHENSIVE METABOLIC PANEL
ALT: 18 U/L (ref 0–35)
AST: 16 U/L (ref 0–37)
Albumin: 4.4 g/dL (ref 3.5–5.2)
Alkaline Phosphatase: 82 U/L (ref 39–117)
BUN: 17 mg/dL (ref 6–23)
CO2: 22 mEq/L (ref 19–32)
Calcium: 9.1 mg/dL (ref 8.4–10.5)
Chloride: 106 mEq/L (ref 96–112)
Creatinine, Ser: 0.64 mg/dL (ref 0.40–1.20)
GFR: 103.89 mL/min (ref >60.00)
Glucose, Bld: 96 mg/dL (ref 70–99)
Potassium: 3.7 mEq/L (ref 3.5–5.1)
Sodium: 138 mEq/L (ref 135–145)
Total Bilirubin: 0.5 mg/dL (ref 0.2–1.2)
Total Protein: 6.4 g/dL (ref 6.0–8.3)

## 2021-10-15 LAB — LIPASE: Lipase: 269 U/L — ABNORMAL HIGH (ref 11.0–59.0)

## 2021-10-15 LAB — AMYLASE: Amylase: 123 U/L (ref 27–131)

## 2021-10-15 MED ORDER — CEPHALEXIN 500 MG PO CAPS: 500.0000 mg | ORAL_CAPSULE | Freq: Two times a day (BID) | ORAL | 0 refills | Status: DC

## 2021-10-15 MED ORDER — IOHEXOL 300 MG/ML  SOLN
100.0000 mL | Freq: Once | INTRAMUSCULAR | Status: AC | PRN
  Administered 2021-10-15: 100 mL via INTRAVENOUS

## 2021-10-15 NOTE — Progress Notes (Signed)
Subjective:     Patient ID: Melinda Porter, female    DOB: [birth year], 51 y.o.   MRN: [id number]  Chief Complaint  Patient presents with   Abdominal Pain    Pain started last week   Weight Loss    Lost 6 lbs last week    Burning with urination   Nausea   Fatigue    Has been sleeping a lot     HPI Abd pain for 1 wk mostly lower abd but mid epi and RUQ as well.  Has "enlarged spleen", dysuria, freq, nausea, wt loss-6# in 1 wk.   ?fever-tactile Fatigue. For 1 wk. Sleeping >16hr/day.  No energy.  No bleeding.   Drinks about 1/2 protein drink and "stomach".  No appetite.   Not on ozempic-not approved Took 2 doses of orencia-didn't take this week d/t poss infection.   Not overly depressed.    Health Maintenance Due  Topic Date Due   Hepatitis C Screening  Never done    Past Medical History:  Diagnosis Date   Anemia    Anxiety    Arthritis    Back pain    Bilateral swelling of feet    Depression    Endometriosis    Fatty liver    Fibromyalgia    Fibromyalgia    GERD (gastroesophageal reflux disease)    Hiatal hernia    Hyperlipemia    Hypertension    IBS (irritable bowel syndrome)    Joint pain    Lupus (H[facility])    Psoriatic arthritis ([facility])    PTSD (post-traumatic stress disorder)    Sjogren's disease ([place])    Vitamin D deficiency     Past Surgical History:  Procedure Laterality Date   CESAREAN SECTION  1997   FLEXIBLE SIGMOIDOSCOPY N/A 01/06/2017   Procedure: FLEXIBLE SIGMOIDOSCOPY;  Surgeon: Yetta Flock, MD;  Location: [facility];  Service: Gastroenterology;  Laterality: N/A;   OOPHORECTOMY Bilateral    PARTIAL HYSTERECTOMY  07/2009   TEMPOROMANDIBULAR JOINT SURGERY  2009   right side   TONSILLECTOMY  2009    Outpatient Medications Prior to Visit  Medication Sig Dispense Refill   abatacept (ORENCIA) 250 MG injection 1000 mg     Ascorbic Acid (VITA-C PO) Take by mouth. 3 gummies a day     clonazePAM (KLONOPIN) 1 MG tablet TAKE 1 TABLET(1  MG) BY MOUTH THREE TIMES DAILY AS NEEDED FOR ANXIETY 90 tablet 3   cyclobenzaprine (FLEXERIL) 10 MG tablet Take 1-2 tablets (10-20 mg total) by mouth 3 (three) times daily as needed for muscle spasms. 60 tablet 0   diphenoxylate-atropine (LOMOTIL) 2.5-0.025 MG tablet Take 1 tablet by mouth 2 (two) times daily as needed for diarrhea or loose stools. 60 tablet 2   ferrous sulfate 324 (65 Fe) MG TBEC Take 1 tablet (324 mg total) by mouth in the morning and at bedtime. 180 tablet 3   fluticasone (FLONASE) 50 MCG/ACT nasal spray SPRAY 2 SPRAYS INTO EACH NOSTRIL EVERY DAY 48 mL 2   glucose 4 GM chewable tablet Chew 1 tablet (4 g total) by mouth as needed for low blood sugar. 50 tablet 1   losartan (COZAAR) 100 MG tablet TAKE 1 TABLET(100 MG) BY MOUTH DAILY 30 tablet 5   mesalamine (LIALDA) 1.2 g EC tablet Take 4 tablets (4.8 g total) by mouth daily. 360 tablet 3   ondansetron (ZOFRAN-ODT) 8 MG disintegrating tablet Take 1 tablet (8 mg total) by mouth every 8 (eight) hours  as needed for nausea. 30 tablet 0   pantoprazole (PROTONIX) 40 MG tablet TAKE 1 TABLET(40 MG) BY MOUTH TWICE DAILY 180 tablet 1   Potassium 95 MG TABS Take by mouth.     promethazine (PHENERGAN) 25 MG tablet 1 tablet as needed     Semaglutide,0.25 or 0.5MG/DOS, (OZEMPIC, 0.25 OR 0.5 MG/DOSE,) 2 MG/1.5ML SOPN Inject 0.25 mg into the skin once a week. (Patient not taking: Reported on 09/18/2021) 3 mL 1   sertraline (ZOLOFT) 100 MG tablet Take 1.5 tablets (150 mg total) by mouth daily. 135 tablet 3   sulfaSALAzine (AZULFIDINE) 500 MG tablet Take 500 mg by mouth daily.     topiramate (TOPAMAX) 100 MG tablet TAKE 1 TABLET(100 MG) BY MOUTH TWICE DAILY 180 tablet 1   valACYclovir (VALTREX) 1000 MG tablet Take 2 tablets (2,000 mg total) by mouth as needed. For two doses for cold sores. 20 tablet 0   Vitamin D, Ergocalciferol, (DRISDOL) 1.25 MG (50000 UNIT) CAPS capsule Take 1 capsule (50,000 Units total) by mouth every 7 (seven) days. 12 capsule 3    No facility-administered medications prior to visit.    Allergies  Allergen Reactions   Lyrica [Pregabalin] Shortness Of Breath   Sulfa Antibiotics Rash and Other (See Comments)   Ciprofloxacin Nausea Only   Gabapentin Other (See Comments)    Stroke like symptoms   Morphine Sulfate     Other reaction(s): Unknown   Nsaids Other (See Comments)    Liver problems- can't take tylenol, ibuprofen etc.   Prednisone Other (See Comments)    "I get deathly ill"   Remicade [Infliximab]    Tolmetin     Other reaction(s): Other (See Comments) Liver problems- can't take tylenol, ibuprofen etc.   Codeine Rash    Other reaction(s): Unknown   Humira [Adalimumab] Rash    Worsening of Lupus rash.   Morphine And Related Rash    "Wired up"   Penicillins Rash    Has patient had a PCN reaction causing immediate rash, facial/tongue/throat swelling, SOB or lightheadedness with hypotension: NO Has patient had a PCN reaction causing severe rash involving mucus membranes or skin necrosis: NO Has patient had a PCN reaction that required hospitalization NO Has patient had a PCN reaction occurring within the last 10 years: NO If all of the above answers are "NO", then may proceed with Cephalosporin use.   ROS neg/noncontributory except as noted HPI/below      Objective:     BP 124/82   Pulse 81   Temp 98.3 F (36.8 C) (Temporal)   Ht 5' 4"  (1.626 m)   Wt 252 lb (114.3 kg)   LMP 06/23/2009   SpO2 97%   BMI 43.26 kg/m  Wt Readings from Last 3 Encounters:  10/15/21 252 lb (114.3 kg)  09/18/21 254 lb (115.2 kg)  07/16/21 255 lb 12.8 oz (116 kg)    Physical Exam   Gen: WDWN NAD HEENT: NCAT, conjunctiva not injected, sclera nonicteric NECK:  supple, no thyromegaly, no nodes, no carotid bruits CARDIAC: RRR, S1S2+, no murmur. DP 2+B LUNGS: CTAB. No wheezes ABDOMEN:  BS+, soft, + mod diffusely tender esp upper abd/ruq, No HSM, no masses.  No CVAT EXT:  no edema MSK: no gross  abnormalities.  NEURO: A&O x3.  CN II-XII intact.  PSYCH: normal mood. Good eye contact  Results for orders placed or performed in visit on 10/15/21  POCT urinalysis dipstick  Result Value Ref Range   Color, UA yellow  Clarity, UA clear    Glucose, UA Negative Negative   Bilirubin, UA neg    Ketones, UA positive    Spec Grav, UA 1.025 1.010 - 1.025   Blood, UA neg    pH, UA 5.5 5.0 - 8.0   Protein, UA Negative Negative   Urobilinogen, UA 0.2 0.2 or 1.0 E.U./dL   Nitrite, UA neg    Leukocytes, UA Trace (A) Negative   Appearance     Odor          Assessment & Plan:   Problem List Items Addressed This Visit   None Visit Diagnoses     Generalized abdominal pain    -  Primary   Relevant Orders   POCT urinalysis dipstick (Completed)      Abd pain-?UC,UTI,Pancreatitis,gallbladder, other.   Will do keflex 500bid x 7d for uti-check cx.  Check cbcd,cmp,amy/lipase.   Check CT abd.    Worse, etc. To ER  No orders of the defined types were placed in this encounter.   Melinda Hampshire, MD

## 2021-10-15 NOTE — Patient Instructions (Signed)
Take the keflex.  Ordering CT today.  Worse, ER

## 2021-10-16 LAB — URINE CULTURE
MICRO NUMBER:: 13600928
SPECIMEN QUALITY:: ADEQUATE

## 2021-10-18 ENCOUNTER — Telehealth: Payer: Self-pay | Admitting: Family Medicine

## 2021-10-18 NOTE — Telephone Encounter (Signed)
Patient Name: Melinda Porter Gender: Female DOB: 12/12/1970 Age: 51 Y 3 M 6 D Return Phone Number: [phone] (Primary) Address: City/ State/ Zip: [place]  [address] Client [facility] [place] Client Site [facility] [employer] Type Call Who Is Calling Patient / Member / Family / Caregiver Call Type Triage / Clinical Relationship To Patient Self Return Phone Number [phone] (Primary) Chief Complaint Abdominal Pain Reason for Call Symptomatic / Request for Health Information Initial Comment Caller states, was seen on Fri. has pancreatitis. Yesterday was feeling inflammation, pain. Was reading about it. Can take an antibiotic. Keflex was called in. Waiting for results from UTI. Is this a good antibiotic for pancreatitis. Saw Dr. Cherlynn Kaiser. Translation No Disp. Time Eilene Ghazi Time) Disposition Final User 10/18/2021 8:17:40 AM Attempt made - message left [place], RN, Estill Bamberg 10/18/2021 8:27:06 AM Attempt made - no message left [place], RN, Estill Bamberg 10/18/2021 8:36:10 AM FINAL ATTEMPT MADE - no message left Yes [place], RN, Estill Bamberg Final Disposition 10/18/2021 8:36:10 AM FINAL ATTEMPT MADE - no message left Yes [place], RN, Estill Bamberg

## 2021-10-18 NOTE — Telephone Encounter (Signed)
Patient stated that she was wondering if an antibiotic that is used for UTI would help with the pancreatitis. Patient stated she feel better than she did on yesterday, still having pain and fatigue. Patient scheduled appointment for tomorrow with Dr. [name] to discuss concerns.

## 2021-10-18 NOTE — Telephone Encounter (Signed)
Please advise 

## 2021-10-18 NOTE — Telephone Encounter (Signed)
Patient called and stated she has pancreatis- stated she was here 7/3 with Dr Cherlynn Kaiser to treat UTI - she wants to know if she can take the antibiotic she was given on Monday or should she come in to be seen again?

## 2021-10-19 ENCOUNTER — Ambulatory Visit (INDEPENDENT_AMBULATORY_CARE_PROVIDER_SITE_OTHER): Payer: Commercial Managed Care - PPO | Admitting: Family Medicine

## 2021-10-19 ENCOUNTER — Encounter: Payer: Self-pay | Admitting: Family Medicine

## 2021-10-19 VITALS — BP 118/80 | HR 85 | Temp 98.1°F | Ht 64.0 in | Wt 252.8 lb

## 2021-10-19 DIAGNOSIS — F419 Anxiety disorder, unspecified: Secondary | ICD-10-CM

## 2021-10-19 DIAGNOSIS — K859 Acute pancreatitis without necrosis or infection, unspecified: Secondary | ICD-10-CM

## 2021-10-19 DIAGNOSIS — M3500 Sicca syndrome, unspecified: Secondary | ICD-10-CM

## 2021-10-19 DIAGNOSIS — F321 Major depressive disorder, single episode, moderate: Secondary | ICD-10-CM

## 2021-10-19 DIAGNOSIS — R110 Nausea: Secondary | ICD-10-CM

## 2021-10-19 DIAGNOSIS — L4050 Arthropathic psoriasis, unspecified: Secondary | ICD-10-CM

## 2021-10-19 MED ORDER — ONDANSETRON HCL 8 MG PO TABS: 8.0000 mg | ORAL_TABLET | Freq: Three times a day (TID) | ORAL | 0 refills | Status: DC | PRN

## 2021-10-19 NOTE — Assessment & Plan Note (Signed)
See above.  She has been under more stress recently.  We will continue her Zoloft 150 mg daily.  She deferred making any dose changes or referral to therapy today.

## 2021-10-19 NOTE — Assessment & Plan Note (Signed)
She has been under a lot of stress recently however symptoms are currently manageable.  We discussed referral to therapy however she deferred.  We will continue her current medication regimen with Zoloft 150 mg daily and clonazepam 1 mg 3 times daily as needed.

## 2021-10-19 NOTE — Telephone Encounter (Signed)
Patient was here to day

## 2021-10-19 NOTE — Patient Instructions (Signed)
It was very nice to see you today!  I am glad that you are feeling a little bit better.  I will send in more Zofran.  Please let me know if you need any pain medications.  Please continue a bland diet until your symptoms resolve.  Please follow-up with your gastroenterologist next week.  Take care, Dr [name]  PLEASE NOTE:  If you had any lab tests please let us know if you have not heard back within a few days. You may see your results on mychart before we have a chance to review them but we will give you a call once they are reviewed by [place]. If we ordered any referrals today, please let us know if you have not heard from their office within the next week.   Please try these tips to maintain a healthy lifestyle:  Eat at least 3 REAL meals and 1-2 snacks per day.  Aim for no more than 5 hours between eating.  If you eat breakfast, please do so within one hour of getting up.   Each meal should contain half fruits/vegetables, one quarter protein, and one quarter carbs (no bigger than a computer mouse)  Cut down on sweet beverages. This includes juice, soda, and sweet tea.   Drink at least 1 glass of water with each meal and aim for at least 8 glasses per day  Exercise at least 150 minutes every week.

## 2021-10-19 NOTE — Progress Notes (Signed)
   Melinda Porter is a 51 y.o. female who presents today for an office visit.  Assessment/Plan:  New/Acute Problems: Pancreatitis  Lipase of 269 earlier this week however her CT scan did not show any abnormalities.  It is reassuring that symptoms are improving with bland diet.  She will continue to advance her diet as tolerated.  We will give more Zofran.  Offered Porter medications however she deferred for now.  Discussed with patient etiology of her pancreatitis is not clear.  She is concerned it could be due to her Melinda Porter however her rheumatologist told her this was likely not the case.  She does have underlying history of ulcerative colitis and Sjogren's syndrome which could be contributing as well.  Chronic Problems Addressed Today: Psoriatic arthritis (Melinda Porter) She will no longer be taking her Orencia due to concern for side effects.  She may be considering switching to a different rheumatologist.  She will let me know if she would like to have a referral.  Sjogren's syndrome (Melinda Porter) Potentially could be contributing to her pancreatitis.  Will defer further management to rheumatology.  Anxiety She has been under a lot of stress recently however symptoms are currently manageable.  We discussed referral to therapy however she deferred.  We will continue her current medication regimen with Zoloft 150 mg daily and clonazepam 1 mg 3 times daily as needed.  Depression, major, single episode, moderate (Melinda Porter) See above.  She has been under more stress recently.  We will continue her Zoloft 150 mg daily.  She deferred making any dose changes or referral to therapy today.     Subjective:  HPI:  Patient here for abdominal Porter follow up. She was seen here by a different provider 4 days ago for this.  Labs showed lipase of 269. She ended up getting a CT scan which did not show any acute abnormalities. Her Porter was severe for a few days but seems to be improving over the last day or so. She has had some  nausea.  No vomiting.  She has been trying to keep a bland diet and drink plenty of fluids.       Objective:  Physical Exam: BP 118/80   Pulse 85   Temp 98.1 F (36.7 C) (Temporal)   Ht 5' 4"  (1.626 m)   Wt 252 lb 12.8 oz (114.7 kg)   LMP 06/23/2009   SpO2 97%   BMI 43.39 kg/m   Gen: No acute distress, resting comfortably Neuro: Grossly normal, moves all extremities Psych: Normal affect and thought content      Melinda Fronczak M. [name], MD 10/19/2021 11:48 AM

## 2021-10-19 NOTE — Assessment & Plan Note (Signed)
Potentially could be contributing to her pancreatitis.  Will defer further management to rheumatology.

## 2021-10-19 NOTE — Assessment & Plan Note (Signed)
She will no longer be taking her Orencia due to concern for side effects.  She may be considering switching to a different rheumatologist.  She will let me know if she would like to have a referral.

## 2021-10-23 ENCOUNTER — Ambulatory Visit (INDEPENDENT_AMBULATORY_CARE_PROVIDER_SITE_OTHER): Payer: Commercial Managed Care - PPO | Admitting: Gastroenterology

## 2021-10-23 ENCOUNTER — Encounter: Payer: Self-pay | Admitting: Gastroenterology

## 2021-10-23 ENCOUNTER — Other Ambulatory Visit (INDEPENDENT_AMBULATORY_CARE_PROVIDER_SITE_OTHER): Payer: Commercial Managed Care - PPO

## 2021-10-23 VITALS — BP 145/100 | HR 99 | Ht 64.0 in | Wt 252.0 lb

## 2021-10-23 DIAGNOSIS — R748 Abnormal levels of other serum enzymes: Secondary | ICD-10-CM

## 2021-10-23 LAB — LIPASE: Lipase: 49 U/L (ref 11.0–59.0)

## 2021-10-23 NOTE — Progress Notes (Signed)
10/23/2021 Melinda Porter [id number] 01-16-1971   HISTORY OF PRESENT ILLNESS: This is a 51 year old female who is a patient of Dr. Blanch Media.  She follows here for her mild ulcerative colitis diagnosed in October 21 that is seemingly stable on Lialda 4.8 g daily.  Also follows here for GERD and on iron replacement for iron deficiency anemia.  Other medical problems include lupus, psoriatic arthritis, hypertension, hyperlipidemia, obesity, PTSD, anxiety and depression.  She is intolerant to prednisone, allergic to Humira.  She is here today for evaluation of an elevated lipase level.  One-time lipase elevation at 269.  Abdominal pains were generalized and nonspecific.  CT scan did not show pancreatitis.  She does not drink alcohol, does not like she has any gallstone/gallbladder issue.  Does not look like she has had any triglycerides/lipid panel performed anytime recently, which I did not realize until after her visit.  No new medications/common offenders.  Also noted to have splenomegaly.  She does not have cirrhosis.  Looks like the splenomegaly has been present at least dating back to 2021, but not present in 2018.  She does have some mild thrombocytopenia related to this.  In regards to her ulcerative colitis she is still on her Lialda.  Says that her baseline is about 5 bowel movements a day, recently has been having about 7 bowel movements a day, but says that they are solid, not diarrhea.  No rectal bleeding.   Past Medical History:  Diagnosis Date   Anemia    Anxiety    Arthritis    Back pain    Bilateral swelling of feet    Depression    Endometriosis    Fatty liver    Fibromyalgia    Fibromyalgia    GERD (gastroesophageal reflux disease)    Hiatal hernia    Hyperlipemia    Hypertension    IBS (irritable bowel syndrome)    Joint pain    Lupus (HCC)    Psoriatic arthritis (HCC)    PTSD (post-traumatic stress disorder)    Sjogren's disease ([place])    Vitamin D deficiency     Past Surgical History:  Procedure Laterality Date   CESAREAN SECTION  1997   FLEXIBLE SIGMOIDOSCOPY N/A 01/06/2017   Procedure: FLEXIBLE SIGMOIDOSCOPY;  Surgeon: Yetta Flock, MD;  Location: [facility];  Service: Gastroenterology;  Laterality: N/A;   OOPHORECTOMY Bilateral    PARTIAL HYSTERECTOMY  07/2009   TEMPOROMANDIBULAR JOINT SURGERY  2009   right side   TONSILLECTOMY  2009    reports that she quit smoking about 22 years ago. Her smoking use included cigarettes. She has never used smokeless tobacco. She reports that she does not drink alcohol and does not use drugs. family history includes Alcoholism in her father; Colon cancer in her maternal uncle; Colon polyps in her maternal grandmother; Diabetes in her maternal grandfather; Heart disease in her maternal grandfather; Hypertension in her maternal grandfather; Liver disease in her father; Lung cancer in her maternal grandmother; Thyroid disease in her mother. Allergies  Allergen Reactions   Lyrica [Pregabalin] Shortness Of Breath   Sulfa Antibiotics Rash and Other (See Comments)   Ciprofloxacin Nausea Only   Gabapentin Other (See Comments)    Stroke like symptoms   Morphine Sulfate     Other reaction(s): Unknown   Nsaids Other (See Comments)    Liver problems- can't take tylenol, ibuprofen etc.   Prednisone Other (See Comments)    "I get deathly ill"  Remicade [Infliximab]    Tolmetin     Other reaction(s): Other (See Comments) Liver problems- can't take tylenol, ibuprofen etc.   Codeine Rash    Other reaction(s): Unknown   Humira [Adalimumab] Rash    Worsening of Lupus rash.   Morphine And Related Rash    "Wired up"   Penicillins Rash    Has patient had a PCN reaction causing immediate rash, facial/tongue/throat swelling, SOB or lightheadedness with hypotension: NO Has patient had a PCN reaction causing severe rash involving mucus membranes or skin necrosis: NO Has patient had a PCN reaction that required  hospitalization NO Has patient had a PCN reaction occurring within the last 10 years: NO If all of the above answers are "NO", then may proceed with Cephalosporin use.      Outpatient Encounter Medications as of 10/23/2021  Medication Sig   Ascorbic Acid (VITA-C PO) Take by mouth. 3 gummies a day   clonazePAM (KLONOPIN) 1 MG tablet TAKE 1 TABLET(1 MG) BY MOUTH THREE TIMES DAILY AS NEEDED FOR ANXIETY   cyclobenzaprine (FLEXERIL) 10 MG tablet Take 1-2 tablets (10-20 mg total) by mouth 3 (three) times daily as needed for muscle spasms.   diphenoxylate-atropine (LOMOTIL) 2.5-0.025 MG tablet Take 1 tablet by mouth 2 (two) times daily as needed for diarrhea or loose stools.   ferrous sulfate 324 (65 Fe) MG TBEC Take 1 tablet (324 mg total) by mouth in the morning and at bedtime.   fluticasone (FLONASE) 50 MCG/ACT nasal spray SPRAY 2 SPRAYS INTO EACH NOSTRIL EVERY DAY   glucose 4 GM chewable tablet Chew 1 tablet (4 g total) by mouth as needed for low blood sugar.   losartan (COZAAR) 100 MG tablet TAKE 1 TABLET(100 MG) BY MOUTH DAILY   mesalamine (LIALDA) 1.2 g EC tablet Take 4 tablets (4.8 g total) by mouth daily.   ondansetron (ZOFRAN) 8 MG tablet Take 1 tablet (8 mg total) by mouth every 8 (eight) hours as needed for nausea or vomiting.   pantoprazole (PROTONIX) 40 MG tablet TAKE 1 TABLET(40 MG) BY MOUTH TWICE DAILY   Potassium 95 MG TABS Take by mouth.   promethazine (PHENERGAN) 25 MG tablet 1 tablet as needed   sertraline (ZOLOFT) 100 MG tablet Take 1.5 tablets (150 mg total) by mouth daily.   topiramate (TOPAMAX) 100 MG tablet TAKE 1 TABLET(100 MG) BY MOUTH TWICE DAILY   valACYclovir (VALTREX) 1000 MG tablet Take 2 tablets (2,000 mg total) by mouth as needed. For two doses for cold sores.   Vitamin D, Ergocalciferol, (DRISDOL) 1.25 MG (50000 UNIT) CAPS capsule Take 1 capsule (50,000 Units total) by mouth every 7 (seven) days.   abatacept (ORENCIA) 250 MG injection 1000 mg (Patient not taking:  Reported on 10/23/2021)   No facility-administered encounter medications on file as of 10/23/2021.     REVIEW OF SYSTEMS  : All other systems reviewed and negative except where noted in the History of Present Illness.   PHYSICAL EXAM: BP (!) 145/100   Pulse 99   Ht 5' 4"  (1.626 m)   Wt 252 lb (114.3 kg)   LMP 06/23/2009   BMI 43.26 kg/m  General: Well developed white female in no acute distress Head: Normocephalic and atraumatic Eyes:  Sclerae anicteric, conjunctiva pink. Ears: Normal auditory acuity Lungs: Clear throughout to auscultation; no W/R/R. Heart: Regular rate and rhythm; no M/R/G. Abdomen: Soft, non-distended.  BS present.  Non-tender. Musculoskeletal: Symmetrical with no gross deformities  Skin: No lesions on visible extremities  Extremities: No edema  Neurological: Alert oriented x 4, grossly non-focal Psychological:  Alert and cooperative. Normal mood and affect  ASSESSMENT AND PLAN: *Elevated lipase: One-time lipase elevation at 269.  Abdominal pains were generalized and nonspecific.  CT scan did not show pancreatitis.  We will repeat a lipase today.  She does not drink alcohol, does not like she has any gallstone/gallbladder issue.  Does not look like she has had any triglycerides/lipid panel performed anytime recently, which I did not realize until after her visit.  No new medications/common offenders.  If this was pancreatitis then it was very mild, first episode.  We will check an IgG4 for autoimmune pancreatitis since she does have several autoimmune conditions and had several questions about what to do to prevent it, what would have caused it, etc.  Advised on a healthy, low-fat diet. *Splenomegaly: Unsure of the cause of this.  She does not have cirrhosis.  Looks like it has been present at least dating back to 2021, but not present in 2018.  She does have some mild thrombocytopenia related to this. *Ulcerative colitis: We will continue her Lialda 4.8 g daily for  now.  She will follow-up with Dr. Henrene Pastor for her routine visit in regards to this sometime in September for her 57-monthvisit.   CC:  PVivi Barrack MD

## 2021-10-23 NOTE — Patient Instructions (Signed)
If you are age 49 or younger, your body mass index should be between 19-25. Your Body mass index is 43.26 kg/m. If this is out of the aformentioned range listed, please consider follow up with your Primary Care Provider.  ________________________________________________________  The [facility] providers would like to encourage you to use [facility] to communicate with providers for non-urgent requests or questions.  Due to long hold times on the telephone, sending your provider a message by [facility] may be a faster and more efficient way to get a response.  Please allow 48 business hours for a response.  Please remember that this is for non-urgent requests.  _______________________________________________________  Your provider has requested that you go to the basement level for lab work before leaving today. Press "B" on the elevator. The lab is located at the first door on the left as you exit the elevator.  Follow up pending  Thank you for entrusting me with your care and choosing [facility].  Alonza Bogus, PA-C

## 2021-10-23 NOTE — Progress Notes (Signed)
Noted  

## 2021-10-26 LAB — IGG 4: IgG, Subclass 4: 1 mg/dL — ABNORMAL LOW (ref 2–96)

## 2021-10-29 ENCOUNTER — Ambulatory Visit: Payer: Commercial Managed Care - PPO | Admitting: Family Medicine

## 2021-11-12 ENCOUNTER — Ambulatory Visit (INDEPENDENT_AMBULATORY_CARE_PROVIDER_SITE_OTHER): Payer: Commercial Managed Care - PPO | Admitting: Family Medicine

## 2021-11-12 VITALS — BP 130/79 | HR 79 | Temp 98.5°F | Ht 64.0 in | Wt 256.4 lb

## 2021-11-12 DIAGNOSIS — G4730 Sleep apnea, unspecified: Secondary | ICD-10-CM | POA: Diagnosis not present

## 2021-11-12 DIAGNOSIS — K51919 Ulcerative colitis, unspecified with unspecified complications: Secondary | ICD-10-CM

## 2021-11-12 DIAGNOSIS — N390 Urinary tract infection, site not specified: Secondary | ICD-10-CM

## 2021-11-12 DIAGNOSIS — I10 Essential (primary) hypertension: Secondary | ICD-10-CM

## 2021-11-12 HISTORY — DX: Sleep apnea, unspecified: G47.30

## 2021-11-12 LAB — URINALYSIS, ROUTINE W REFLEX MICROSCOPIC
Bilirubin Urine: NEGATIVE
Hgb urine dipstick: NEGATIVE
Ketones, ur: NEGATIVE
Nitrite: NEGATIVE
RBC / HPF: NONE SEEN (ref 0–?)
Specific Gravity, Urine: 1.01 (ref 1.000–1.030)
Total Protein, Urine: NEGATIVE
Urine Glucose: NEGATIVE
Urobilinogen, UA: 0.2 (ref 0.0–1.0)
pH: 6 (ref 5.0–8.0)

## 2021-11-12 LAB — POCT URINALYSIS DIPSTICK
Bilirubin, UA: NEGATIVE
Blood, UA: NEGATIVE
Glucose, UA: NEGATIVE
Ketones, UA: NEGATIVE
Nitrite, UA: NEGATIVE
Protein, UA: NEGATIVE
Spec Grav, UA: 1.015 (ref 1.010–1.025)
Urobilinogen, UA: 0.2 E.U./dL
pH, UA: 6 (ref 5.0–8.0)

## 2021-11-12 MED ORDER — NITROFURANTOIN MONOHYD MACRO 100 MG PO CAPS: 100.0000 mg | ORAL_CAPSULE | Freq: Two times a day (BID) | ORAL | 0 refills | Status: DC

## 2021-11-12 NOTE — Assessment & Plan Note (Signed)
Follows with GI.  She is currently lialda 1.2g 4 times daily.

## 2021-11-12 NOTE — Patient Instructions (Addendum)
It was very nice to see you today!  Please start the macrobid.  I will refer you for a sleep study.   Take care, Dr [name]  PLEASE NOTE:  If you had any lab tests please let us know if you have not heard back within a few days. You may see your results on mychart before we have a chance to review them but we will give you a call once they are reviewed by [place]. If we ordered any referrals today, please let us know if you have not heard from their office within the next week.   Please try these tips to maintain a healthy lifestyle:  Eat at least 3 REAL meals and 1-2 snacks per day.  Aim for no more than 5 hours between eating.  If you eat breakfast, please do so within one hour of getting up.   Each meal should contain half fruits/vegetables, one quarter protein, and one quarter carbs (no bigger than a computer mouse)  Cut down on sweet beverages. This includes juice, soda, and sweet tea.   Drink at least 1 glass of water with each meal and aim for at least 8 glasses per day  Exercise at least 150 minutes every week.  i

## 2021-11-12 NOTE — Assessment & Plan Note (Signed)
At goal.  Typically well controlled on losartan 100 mg daily.  Continue regimen for now.  Continue home monitoring.

## 2021-11-12 NOTE — Assessment & Plan Note (Signed)
Patient woke up choking and in the middle of the night a couple of weeks ago.  She is concerned about possible sleep apnea.  Does not feel refreshed upon awakening needed.  We will place referral for sleep study

## 2021-11-12 NOTE — Progress Notes (Signed)
   Melinda Porter is a 51 y.o. female who presents today for an office visit.  Assessment/Plan:  New/Acute Problems: UTI Urine positive leukocytes.  Nitrites negative.  Discussed with patient that her culture results may be difficult to interpret due to her recent antibiotic use.  Given her positive leukocytes we will empirically start Macrobid 100 mg twice daily for the next 7 days until we can see her culture results.  Encouraged hydration.  e discussed reasons to return to care.  Your lites okay Chronic Problems Addressed Today: Sleep-disordered breathing Patient woke up choking and in the middle of the night a couple of weeks ago.  She is concerned about possible sleep apnea.  Does not feel refreshed upon awakening needed.  We will place referral for sleep study  Ulcerative colitis ([place]) Follows with GI.  She is currently lialda 1.2g 4 times daily.  Hypertension At goal.  Typically well controlled on losartan 100 mg daily.  Continue regimen for now.  Continue home monitoring.     Subjective:  HPI:  Patient here with right flank pain.  This started about a week ago.  She is concerned about possible kidney stone or infection.  She started a course of Keflex that she had leftover from a office visit earlier this month.  She has been on this for about 5 days.  Symptoms seem to be improving modestly.  Some nausea which is her baseline.  No vomiting.  No hematuria.  No melena or hematochezia.  No fevers or chills.        Objective:  Physical Exam: BP 130/79   Pulse 79   Temp 98.5 F (36.9 C) (Temporal)   Ht 5' 4"  (1.626 m)   Wt 256 lb 6.4 oz (116.3 kg)   LMP 06/23/2009   SpO2 96%   BMI 44.01 kg/m   Gen: No acute distress, resting comfortably CV: Regular rate and rhythm with no murmurs appreciated Pulm: Normal work of breathing, clear to auscultation bilaterally with no crackles, wheezes, or rhonchi Neuro: Grossly normal, moves all extremities Psych: Normal affect and  thought content      Clerence Gubser M. [name], MD 11/12/2021 12:36 PM

## 2021-11-13 ENCOUNTER — Ambulatory Visit: Payer: Commercial Managed Care - PPO | Admitting: Family Medicine

## 2021-11-14 LAB — URINE CULTURE
MICRO NUMBER:: 13714974
SPECIMEN QUALITY:: ADEQUATE

## 2021-11-15 ENCOUNTER — Other Ambulatory Visit: Payer: Self-pay | Admitting: *Deleted

## 2021-11-15 MED ORDER — CEFIXIME 400 MG PO CAPS: 400.0000 mg | ORAL_CAPSULE | Freq: Every day | ORAL | 0 refills | Status: DC

## 2021-11-15 NOTE — Progress Notes (Signed)
Please inform patient of the following:  Her urine culture confirms UTI however unfortunately the antibiotic we have her on is not effect against the bacteria she has. We need to switch her antibiotic. Please send in suprax 275m bid x 7 days. She should let [place] if her symptoms are not improving.  CAlgis Greenhouse PJerline Pain MD 11/15/2021 10:03 AM

## 2021-11-16 ENCOUNTER — Telehealth: Payer: Commercial Managed Care - PPO | Admitting: Nurse Practitioner

## 2021-11-16 ENCOUNTER — Telehealth: Payer: Self-pay | Admitting: Family Medicine

## 2021-11-16 ENCOUNTER — Ambulatory Visit: Payer: Commercial Managed Care - PPO | Admitting: Internal Medicine

## 2021-11-16 DIAGNOSIS — N390 Urinary tract infection, site not specified: Secondary | ICD-10-CM

## 2021-11-16 NOTE — Telephone Encounter (Signed)
Pt states: -started new prescription 08/03 evening (suprax) -Has slept all day -having trouble staying awake -No longer taking Macrobid   Pt referred to PCP triage nurse. Warm transferred to [facility], patient coordinator for Team health.  Awaiting follow up notes.

## 2021-11-16 NOTE — Telephone Encounter (Signed)
Spoke to pt told her Dr. [name] has gone for the day so I asked Inda Coke, PA to review your message and chart. She said She needs to have an office visit for further evaluation since its difficult to say if her symptoms are related to the medication, her infection, or something else. Asked pt why she cancelled her appt this afternoon at 4:30 with Dr. Randol Kern? Pt said she did not feel like driving in. Pt said she looked up medication and it says can cause tiredness, can you not just change antibiotic this is the 3rd one I am one. Told pt no, need to be evaluated. Aldona Bar said, At the least, she could do a virtual visit with one of the [place] urgent care providers to see what is the best next step for her or go to an Urgent Care. Pt verbalized understanding. Gave pt instructions for virtual urgent care visit.  Within MyChart, if she clicks Menu, she can select "Virtual Urgent Care Visit". The Virtual Urgent Care Visit is a face-to-face video visit with a [place] provider, between 8am and 8pm, and billed to your insurance. Pt verbalized understanding.

## 2021-11-16 NOTE — Telephone Encounter (Signed)
Pt is not wanting to come in and wants Dr [name] to call another rx in for her. She believes she cannot take the new medication she started yesterday. Please advise

## 2021-11-16 NOTE — Telephone Encounter (Signed)
Please message and advise.

## 2021-11-16 NOTE — Telephone Encounter (Signed)
Patient Name: Melinda Porter Gender: Female DOB: Jul 19, 1970 Age: 51 Y 27 M 4 D Return Phone Number: [phone] (Primary) Address: City/ State/ Zip: [place]  [address] Client [place] [facility] [place] Client Site [place] [facility] [place] [facility] Provider Dimas Chyle- MD Contact Type Call Who Is Calling Patient / Member / Family / Caregiver Call Type Triage / Clinical Relationship To Patient Self Return Phone Number [phone] (Primary) Chief Complaint Fatigue (greater than THREE MONTHS old) Reason for Call Symptomatic / Request for [place] with front office - transferring callCaller started new medication (Cefixime ) last night for the first time and she's been asleep all day and having a hard time staying awake. Caller states she's been very tired Translation No  Nurse Assessment Nurse: Alveta Heimlich, RN, Rise Paganini Date/Time (Eastern Time): 11/16/2021 2:00:39 PM Confirm and document reason for call. If symptomatic, describe symptoms. ---Caller states she started a new med last night. it is Cefuroxime. She is not sure it has anything to do with the med, but she has slept all day today. This is her third antibiotic she has been on for bacterial kidney infection. The nurse called her yesterday to let her know they were changing her antibiotic. She still has some pain abdominal area. No fever. Does the patient have any new or worsening symptoms? ---Yes Will a triage be completed? ---Yes Related visit to physician within the last 2 weeks? ---Yes Does the PT have any chronic conditions? (i.e. diabetes, asthma, this includes High risk factors for pregnancy, etc.) ---Yes List chronic conditions. ---auto immune disorders, lupus, sjogrens, fibromyalgia, depression, anxiety, psoriatic arthritis, ulcerative colitis, Is the patient pregnant or possibly pregnant? (Ask all females between the ages of 43-55)  ---No Is this a behavioral health or substance abuse call? ---No  Guidelines Guideline Title Affirmed Question Affirmed Notes Nurse Date/Time (Eastern Time) Urinary Tract Infection on Antibiotic Follow-up Call - Female Diabetes mellitus or weak immune system (e.g., HIV positive, cancer chemo, splenectomy, organ transplant, chronic steroids) Alveta Heimlich, RN, Rise Paganini 11/16/2021 2:06:12 PM Weakness (Generalized) and Fatigue Taking a medicine that could cause weakness (e.g., blood pressure medications, diuretics) Alveta Heimlich, RN, Rise Paganini 11/16/2021 2:09:39 PM  Disp. Time Eilene Ghazi Time) Disposition Final User 11/16/2021 1:59:35 PM Send To RN Personal Markus Daft, RN, [place] 11/16/2021 2:09:14 PM See PCP within 24 Hours Alveta Heimlich, [place], [place] 11/16/2021 2:12:46 PM See PCP within 24 Hours Yes Alveta Heimlich, RN, Rise Paganini Final Disposition 11/16/2021 2:12:46 PM See PCP within 24 Hours Yes Alveta Heimlich, RN, Rise Paganini Disposition Overriden: SEE PCP WITHIN 3 DAYS Override Reason: Patient's symptoms need a higher level of care Caller Disagree/Comply Comply Caller Understands Yes PreDisposition Call Doctor  Care Advice Given Per Guideline SEE PCP WITHIN 24 HOURS: * Drink extra fluids. DRINK EXTRA FLUIDS - EXTRA NOTES AND WARNINGS: CALL BACK IF: * You become worse CARE ADVICE given per Urinary Tract Infection on Antibiotic Follow-Up Call, Female (Adult) guideline. SEE PCP WITHIN 24 HOURS: * IF OFFICE WILL BE OPEN: You need to be examined within the next 24 hours. Call your doctor (or NP/PA) when the office opens and make an appointment. BRING MEDICINES: CALL BACK IF: CARE ADVICE given per Weakness and Fatigue (Adult) guideline.  Comments User: Debby Bud, RN Date/Time Eilene Ghazi Time): 11/16/2021 2:16:06 PM Pt does not like to go to ER due to her many autoimmune diseases. Did call the office to see if she might come into the office.  Comments User: Debby Bud, RN Date/Time Eilene Ghazi  Time): 11/16/2021 2:17:16 PM She is sceduled for  430 pm with Dr Berniece Pap Referrals REFERRED TO PCP OFFICE

## 2021-11-16 NOTE — Telephone Encounter (Signed)
Melinda Porter, team health nurse, called stating pt has autoimmune disorders and has been to the ED due to them several times. States that patient declined ED visit if could not be seen in office. Per Beverley's recommendation to see PCP within 24 hours, I was able to schedule patient with Dr. Randol Kern on 08/04 @ 4:20pm.

## 2021-11-17 ENCOUNTER — Emergency Department (HOSPITAL_BASED_OUTPATIENT_CLINIC_OR_DEPARTMENT_OTHER)
Admission: EM | Admit: 2021-11-17 | Discharge: 2021-11-17 | Discharge status: Against Medical Advice | Payer: Commercial Managed Care - PPO | Attending: Emergency Medicine | Admitting: Emergency Medicine

## 2021-11-17 ENCOUNTER — Other Ambulatory Visit: Payer: Self-pay

## 2021-11-17 ENCOUNTER — Ambulatory Visit
Admission: RE | Admit: 2021-11-17 | Discharge: 2021-11-17 | Discharge status: Against Medical Advice | Payer: Commercial Managed Care - PPO | Source: Ambulatory Visit | Attending: Family Medicine | Admitting: Family Medicine

## 2021-11-17 ENCOUNTER — Encounter (HOSPITAL_BASED_OUTPATIENT_CLINIC_OR_DEPARTMENT_OTHER): Payer: Self-pay | Admitting: Emergency Medicine

## 2021-11-17 ENCOUNTER — Telehealth: Payer: Commercial Managed Care - PPO | Admitting: Nurse Practitioner

## 2021-11-17 DIAGNOSIS — Z5321 Procedure and treatment not carried out due to patient leaving prior to being seen by health care provider: Secondary | ICD-10-CM | POA: Diagnosis not present

## 2021-11-17 DIAGNOSIS — R399 Unspecified symptoms and signs involving the genitourinary system: Secondary | ICD-10-CM

## 2021-11-17 DIAGNOSIS — N390 Urinary tract infection, site not specified: Secondary | ICD-10-CM | POA: Insufficient documentation

## 2021-11-17 LAB — URINALYSIS, ROUTINE W REFLEX MICROSCOPIC
Bilirubin Urine: NEGATIVE
Glucose, UA: NEGATIVE mg/dL
Hgb urine dipstick: NEGATIVE
Ketones, ur: NEGATIVE mg/dL
Leukocytes,Ua: NEGATIVE
Nitrite: NEGATIVE
Protein, ur: NEGATIVE mg/dL
Specific Gravity, Urine: 1.025 (ref 1.005–1.030)
pH: 5.5 (ref 5.0–8.0)

## 2021-11-17 LAB — PREGNANCY, URINE: Preg Test, Ur: NEGATIVE

## 2021-11-17 NOTE — ED Notes (Signed)
Tried twice (once with vein finder) to obtain blood; unable to obtain.

## 2021-11-17 NOTE — ED Triage Notes (Signed)
Pt via pov from home with UTI that she has had several antibiotics for. Pt reports that she had allergic reaction to one medication and was told by teledoc that she needed to have iv antibiotics. Pt alert  & oriented, nad noted.

## 2021-11-17 NOTE — Progress Notes (Signed)
Based on what you shared with me it looks like you have resistant UTI,that should be evaluated in a face to face office visit. I see that you were treated for UTI on 10/13/21. WHen urine culture came back,it showed that infection was resistant to antibiotic given. You were changed to suprax, which you are saying now is causing an allergic  reaction. Review of urine culture and antibiotics needed  to treat  infection, you ar either allergic to or they are injectable or IV medication. You will need to go to urgent care for treatment.  NOTE: There will be NO CHARGE for this eVisit   If you are having a true medical emergency please call 911.      For an urgent face to face visit, [place] has six urgent care centers for your convenience:     [facility] [place] [facility] [place] Get Driving Directions [phone] [address] [place], [place] [address]    [facility] [place] [facility]) Get Driving Directions [phone] [place], [place] [address]  [facility] [place] [facility]) Get Driving Directions [phone] [address] [place],  [place]  [address]  [facility] Get Driving Directions [phone] [address] [place], [place], [place] [address]   [facility] Get Driving Directions  [phone] [address], [place] [address]    [facility]gent [facility] at [place] Get Driving Directions [phone] [address], [place], [place] [address]  Your MyChart E-visit questionnaire answers were reviewed by a board certified advanced clinical practitioner to complete your personal care plan based on your specific symptoms.  Thank you for using e-Visits.

## 2021-11-18 MED ORDER — SULFAMETHOXAZOLE-TRIMETHOPRIM 800-160 MG PO TABS: 1.0000 | ORAL_TABLET | Freq: Two times a day (BID) | ORAL | 0 refills | Status: DC

## 2021-11-18 NOTE — Progress Notes (Signed)
E-Visit for Urinary Problems  We are sorry that you are not feeling well.  Here is how we plan to help!  Based on our discussion regarding sulfa antibiotics and you stating you have been prescribed sulfa abx for several years with no reaction we will try bactrim PO today.  A UTI (Urinary Tract Infection) is a bacterial infection of the bladder.  Most cases of urinary tract infections are simple to treat but a key part of your care is to encourage you to drink plenty of fluids and watch your symptoms carefully.  I have prescribed Bactrim DS One tablet twice a day for 5 days.  Your symptoms should gradually improve. Call us if the burning in your urine worsens, you develop worsening fever, back pain or pelvic pain or if your symptoms do not resolve after completing the antibiotic.  Urinary tract infections can be prevented by drinking plenty of water to keep your body hydrated.  Also be sure when you wipe, wipe from front to back and don't hold it in!  If possible, empty your bladder every 4 hours.  HOME CARE Drink plenty of fluids Compete the full course of the antibiotics even if the symptoms resolve Remember, when you need to go.go. Holding in your urine can increase the likelihood of getting a UTI! GET HELP RIGHT AWAY IF: You cannot urinate You get a high fever Worsening back pain occurs You see blood in your urine You feel sick to your stomach or throw up You feel like you are going to pass out  MAKE SURE YOU  Understand these instructions. Will watch your condition. Will get help right away if you are not doing well or get worse.   Thank you for choosing an e-visit.  Your e-visit answers were reviewed by a board certified advanced clinical practitioner to complete your personal care plan. Depending upon the condition, your plan could have included both over the counter or prescription medications.  Please review your pharmacy choice. Make sure the pharmacy is open so you can  pick up prescription now. If there is a problem, you may contact your provider through [employer] and have the prescription routed to another pharmacy.  Your safety is important to [place]. If you have drug allergies check your prescription carefully.   For the next 24 hours you can use MyChart to ask questions about today's visit, request a non-urgent call back, or ask for a work or school excuse. You will get an email in the next two days asking about your experience. I hope that your e-visit has been valuable and will speed your recovery.

## 2021-11-18 NOTE — Progress Notes (Signed)
Hello Melinda Porter,  You were prescribed an antibiotic Suprax on 11-15-2021. You should still be taking this medication. If you haven't picked it up yet it was sent to [facility] on [address]. This medication was sent for your UTI and a mychart message was sent as well regarding the change in the antibiotic. At this time we would not treat your symptoms as there is already an antibiotic waiting for you at the pharmacy or you should still be taking it if you have picked it up already.  [name]

## 2021-11-18 NOTE — Addendum Note (Signed)
Addended by: Geryl Rankins on: 11/18/2021 09:02 AM   Modules accepted: Orders, Level of Service

## 2021-11-18 NOTE — Progress Notes (Signed)
I have spent 5 minutes in review of e-visit questionnaire, review and updating patient chart, medical decision making and response to patient.  ° °Melinda Porter W Sybil Shrader, NP ° °  °

## 2021-11-19 ENCOUNTER — Institutional Professional Consult (permissible substitution): Payer: Self-pay | Admitting: Neurology

## 2021-11-19 ENCOUNTER — Telehealth: Payer: Self-pay | Admitting: Neurology

## 2021-11-19 NOTE — Telephone Encounter (Signed)
Pt called and cancel appointment said she had a allergic reaction over the weekend,

## 2021-11-21 ENCOUNTER — Encounter (INDEPENDENT_AMBULATORY_CARE_PROVIDER_SITE_OTHER): Payer: Self-pay

## 2021-12-20 ENCOUNTER — Telehealth: Payer: Self-pay | Admitting: Family Medicine

## 2021-12-20 ENCOUNTER — Other Ambulatory Visit: Payer: Self-pay | Admitting: Family Medicine

## 2021-12-20 DIAGNOSIS — F419 Anxiety disorder, unspecified: Secondary | ICD-10-CM

## 2021-12-20 NOTE — Telephone Encounter (Signed)
Patient requests to receive the Pneumonia vaccine. Patient states she is immunocompromised and normally gets the vaccine every 5 years (Patient states it has been 6 years since receiving last vaccine)

## 2021-12-20 NOTE — Telephone Encounter (Signed)
Patient states: -She went to pick up refills of Klonopin 1 mg, Topamax 100 mg, Zoloft 100 mg , and Vitamin D 50,000 UI but was told a refill request for medications was sent to our office  - She is unsure why this needed a refill request since she still had a least 1 refill left on each medication   LAST APPOINTMENT DATE:  11/12/21  NEXT APPOINTMENT DATE: 01/08/22  MEDICATION: topiramate (TOPAMAX) 100 MG tablet clonazePAM (KLONOPIN) 1 MG tablet  sertraline (ZOLOFT) 100 MG tablet  Vitamin D, Ergocalciferol, (DRISDOL) 1.25 MG (50000 UNIT) CAPS capsule   Is the patient out of medication? No  PHARMACY: [id number] [address], [address] AT [address], [place] [address]  Phone:  [phone]  Fax:  [phone]

## 2021-12-21 ENCOUNTER — Other Ambulatory Visit: Payer: Self-pay | Admitting: *Deleted

## 2021-12-21 DIAGNOSIS — E559 Vitamin D deficiency, unspecified: Secondary | ICD-10-CM

## 2021-12-21 MED ORDER — CLONAZEPAM 1 MG PO TABS: 1.0000 mg | ORAL_TABLET | Freq: Three times a day (TID) | ORAL | 3 refills | Status: DC | PRN

## 2021-12-21 MED ORDER — TOPIRAMATE 100 MG PO TABS: ORAL_TABLET | ORAL | 1 refills | Status: DC

## 2021-12-21 MED ORDER — SERTRALINE HCL 100 MG PO TABS: 150.0000 mg | ORAL_TABLET | Freq: Every day | ORAL | 3 refills | Status: DC

## 2021-12-21 MED ORDER — VITAMIN D (ERGOCALCIFEROL) 1.25 MG (50000 UNIT) PO CAPS: 50000.0000 [IU] | ORAL_CAPSULE | ORAL | 3 refills | Status: DC

## 2021-12-22 NOTE — Telephone Encounter (Signed)
Rx was send to pharmacy

## 2021-12-25 ENCOUNTER — Ambulatory Visit (INDEPENDENT_AMBULATORY_CARE_PROVIDER_SITE_OTHER): Payer: Commercial Managed Care - PPO | Admitting: Family Medicine

## 2021-12-25 ENCOUNTER — Encounter: Payer: Self-pay | Admitting: Family Medicine

## 2021-12-25 VITALS — BP 109/77 | HR 78 | Temp 97.9°F | Ht 64.0 in | Wt 257.6 lb

## 2021-12-25 DIAGNOSIS — J302 Other seasonal allergic rhinitis: Secondary | ICD-10-CM

## 2021-12-25 DIAGNOSIS — F419 Anxiety disorder, unspecified: Secondary | ICD-10-CM | POA: Diagnosis not present

## 2021-12-25 DIAGNOSIS — R519 Headache, unspecified: Secondary | ICD-10-CM

## 2021-12-25 MED ORDER — CEPHALEXIN 500 MG PO CAPS: 500.0000 mg | ORAL_CAPSULE | Freq: Four times a day (QID) | ORAL | 0 refills | Status: AC

## 2021-12-25 MED ORDER — SERTRALINE HCL 100 MG PO TABS: 200.0000 mg | ORAL_TABLET | Freq: Every day | ORAL | 3 refills | Status: DC

## 2021-12-25 NOTE — Patient Instructions (Signed)
It was very nice to see you today!  Please start the cephalexin for your sinus infection.  Increase your Zoloft to 2 pills daily.  Let me know how you are feeling in a few weeks.  Take care, Dr [name]  PLEASE NOTE:  If you had any lab tests please let us know if you have not heard back within a few days. You may see your results on mychart before we have a chance to review them but we will give you a call once they are reviewed by [place]. If we ordered any referrals today, please let us know if you have not heard from their office within the next week.   Please try these tips to maintain a healthy lifestyle:  Eat at least 3 REAL meals and 1-2 snacks per day.  Aim for no more than 5 hours between eating.  If you eat breakfast, please do so within one hour of getting up.   Each meal should contain half fruits/vegetables, one quarter protein, and one quarter carbs (no bigger than a computer mouse)  Cut down on sweet beverages. This includes juice, soda, and sweet tea.   Drink at least 1 glass of water with each meal and aim for at least 8 glasses per day  Exercise at least 150 minutes every week.

## 2021-12-25 NOTE — Assessment & Plan Note (Signed)
Likely contributing to above sinus infection.  We will avoid antihistamines due to her Sjogren's syndrome.  May consider referral to allergist or ENT if this continues to be an issue.
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
## 2021-12-25 NOTE — Assessment & Plan Note (Signed)
Lengthy discussion with patient regarding her anxiety levels.  She has been under more stress recently she does not wish to see a therapist at this point.  We will increase her Zoloft to 200 mg daily.  Continue clonazepam 1 mg 3 times daily as needed.  She will follow-up in a few weeks.

## 2022-01-08 ENCOUNTER — Ambulatory Visit: Payer: Commercial Managed Care - PPO | Admitting: Family Medicine

## 2022-01-11 ENCOUNTER — Ambulatory Visit (INDEPENDENT_AMBULATORY_CARE_PROVIDER_SITE_OTHER): Payer: Commercial Managed Care - PPO | Admitting: Family

## 2022-01-11 ENCOUNTER — Encounter: Payer: Self-pay | Admitting: Family

## 2022-01-11 VITALS — BP 130/78 | HR 91 | Temp 98.0°F | Resp 17 | Ht 64.0 in | Wt 254.8 lb

## 2022-01-11 DIAGNOSIS — Z77120 Contact with and (suspected) exposure to mold (toxic): Secondary | ICD-10-CM

## 2022-01-11 DIAGNOSIS — H9201 Otalgia, right ear: Secondary | ICD-10-CM
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
## 2022-01-14 LAB — ALLERGY PANEL 11, MOLD GROUP
Allergen, A. alternata, m6: <0.1 kU/L
Allergen, Mucor Racemosus, M4: <0.1 kU/L
Aspergillus fumigatus, m3: <0.1 kU/L
CLADOSPORIUM HERBARUM (M2) IGE: <0.1 kU/L
CLASS: 0
CLASS: 0
Candida Albicans: <0.1 kU/L
Class: 0
Class: 0
Class: 0

## 2022-01-14 LAB — INTERPRETATION:

## 2022-01-16 NOTE — Progress Notes (Signed)
The mold allergy test is negative. This is not a complete test of all molds, but the most common.

## 2022-01-18 ENCOUNTER — Ambulatory Visit: Payer: Commercial Managed Care - PPO | Admitting: Family Medicine

## 2022-01-29 ENCOUNTER — Ambulatory Visit (INDEPENDENT_AMBULATORY_CARE_PROVIDER_SITE_OTHER): Payer: Commercial Managed Care - PPO | Admitting: Physician Assistant

## 2022-01-29 ENCOUNTER — Encounter: Payer: Self-pay | Admitting: Physician Assistant

## 2022-01-29 ENCOUNTER — Telehealth: Payer: Self-pay | Admitting: Family Medicine

## 2022-01-29 VITALS — BP 128/86 | HR 89 | Temp 97.3°F | Ht 64.0 in | Wt 253.5 lb

## 2022-01-29 DIAGNOSIS — R252 Cramp and spasm: Secondary | ICD-10-CM | POA: Diagnosis not present

## 2022-01-29 DIAGNOSIS — R3915 Urgency of urination: Secondary | ICD-10-CM | POA: Diagnosis not present

## 2022-01-29 LAB — POCT URINALYSIS DIPSTICK
Bilirubin, UA: NEGATIVE
Blood, UA: POSITIVE
Glucose, UA: NEGATIVE
Ketones, UA: NEGATIVE
Nitrite, UA: NEGATIVE
Protein, UA: POSITIVE — AB
Spec Grav, UA: 1.02 (ref 1.010–1.025)
Urobilinogen, UA: 0.2 E.U./dL
pH, UA: 6 (ref 5.0–8.0)

## 2022-01-29 MED ORDER — CEPHALEXIN 500 MG PO CAPS: 500.0000 mg | ORAL_CAPSULE | Freq: Two times a day (BID) | ORAL | 0 refills | Status: DC

## 2022-01-29 NOTE — Telephone Encounter (Signed)
Routing to PCP team and SW,PA-C team for 10/17 appt  Patient Name: Melinda Porter Gender: Female DOB: [birth year] Age: 51 Y 63 M 17 D Return Phone Number: [phone] (Primary) Address: City/ State/ Zip: [place]  [address] Client [place] [facility] [place] Client Site [place] [facility] [place] [facility] Provider Dimas Chyle- MD Contact Type Call Who Is Calling Patient / Member / Family / Caregiver Call Type Triage / Clinical Relationship To Patient Self Return Phone Number [phone] (Primary) Chief Complaint Urine, Blood In Reason for Call Symptomatic / Request for [place] states she has been having UTI symptoms, she has had blood in urine, low back pain, urgency to urinate, trouble empty bladder. Translation No Nurse Assessment Nurse: Lenon Curt, RN, Melanie Date/Time (Eastern Time): 01/29/2022 11:31:39 AM Confirm and document reason for call. If symptomatic, describe symptoms. ---Caller states having UTI symptoms since Friday, she has had blood in urine today, low back pain, urgency to urinate, trouble empty bladder. Moved over the weekend - about 15 minutes. 6 Auto Immune diseases. Has antibiotics in the house - Cephalexin 547m tabs Does the patient have any new or worsening symptoms? ---Yes Will a triage be completed? ---Yes Related visit to physician within the last 2 weeks? ---No Does the PT have any chronic conditions? (i.e. diabetes, asthma, this includes High risk factors for pregnancy, etc.) ---Yes Is the patient pregnant or possibly pregnant? (Ask all females between the ages of 173-55 ---No Is this a behavioral health or substance abuse call? ---No Guidelines Guideline Title Affirmed Question Affirmed Notes Nurse Date/Time (Eastern Time) Urine - Blood In [1] Pain or burning with passing urine AND [2] side (flank) or back pain present CLenon Curt RN, Melanie 01/29/2022  11:33:00 AM  Disp. Time (Eilene GhaziTime) Disposition Final User 01/29/2022 11:02:46 AM Attempt made - message left CLenon Curt RN, MThreasa Beards10/17/2023 11:14:33 AM Attempt made - message left CAllean Found10/17/2023 11:36:56 AM See HCP within 4 Hours (or PCP triage) Yes CLenon Curt RN, Melanie Final Disposition 01/29/2022 11:36:56 AM See HCP within 4 Hours (or PCP triage) Yes CLenon Curt RN, MDonnajean LopesDisagree/Comply Comply Caller Understands Yes PreDisposition Call Doctor Care Advice Given Per Guideline SEE HCP (OR PCP TRIAGE) WITHIN 4 HOURS: * IF OFFICE WILL BE OPEN: You need to be seen within the next 3 or 4 hours. Call your doctor (or NP/PA) now or as soon as the office opens. * UCC: Some UCCs can manage patients who are stable and have less serious symptoms (e.g., minor illnesses and injuries). The triager must [facility] a patient there. If unsure, call ahead. * OFFICE: If patient sounds stable and not seriously ill, consult PCP (or follow your office policy) to see if patient can be seen NOW in office. PAIN MEDICINES: CALL BACK IF: * Fever occurs * You become worse CARE ADVICE given per Urine, Blood In (Adult) guideline. Comments User: MErik Obey RN Date/Time (Eilene GhaziTime): 01/29/2022 11:40:43 AM SFranco Nones@ 3:40pm today Referrals REFERRED TO PCP OFFICE Warm transfer to backline

## 2022-01-29 NOTE — Telephone Encounter (Signed)
Noted  

## 2022-01-29 NOTE — Telephone Encounter (Signed)
Patient states she has been having UTI symptoms since 01/25/22 such as Frequency to urinate, feels like she is unable to empty bladder.  Patient states this morning (01/29/22) patient had blood in her urine (in the toilet) and there was also blood on the toilet paper while wiping.  Patient states she has lower back pain but has been moving, so Patient is not sure if back pain is related.  Transferred to Triage.

## 2022-01-29 NOTE — Patient Instructions (Signed)
It was great to see you!  Dilute regular gatorade for hydration and less sugar  Try to get 64 ounces of fluids  General instructions Make sure you: Pee until your bladder is empty. Do not hold pee for a long time. Empty your bladder after sex. Wipe from front to back after pooping if you are a female. Use each tissue one time when you wipe. Drink enough fluid to keep your pee pale yellow. Keep all follow-up visits as told by your doctor. This is important. Contact a doctor if: You do not get better after 1-2 days. Your symptoms go away and then come back. Get help right away if: You have very bad back pain. You have very bad pain in your lower belly. You have a fever. You are sick to your stomach (nauseous). You are throwing up.

## 2022-01-29 NOTE — Progress Notes (Signed)
Melinda Porter is a 51 y.o. female here for a new problem.  History of Present Illness:   Chief Complaint  Patient presents with   Urinary Urgency    Pt c/o urgency with urination, low back pain started on Friday, hematuria this AM.    HPI  UTI symptoms: She complains of increase in urination and she feels like she is unable to completely empty her bladder since 01/25/2022. She also notes she saw blood in her urine and blood on her toilet paper while wiping this morning (01/29/2022). She complains of lower back pain, but does not know if it related to UTI because she has been moving around. She does have history of UTI. Has multiple drug allergies, but she states that she can tolerate cephalexin. She denies nausea and vomiting.  Bilateral Leg Cramp: She has had recent bilateral leg cramps and she has been taking OTC potassium supplement, which does not help her that much.  She states she is not staying hydrated. She just moved houses over the weekend and did a lot of heavy lifting. She states she cannot tolerate electrolytes drinks due to the amount of sugar. Denies chest pain, SOB, palpitations.  Past Medical History:  Diagnosis Date   Anemia    Anxiety    Arthritis    Back pain    Bilateral swelling of feet    Depression    Endometriosis    Fatty liver    Fibromyalgia    Fibromyalgia    GERD (gastroesophageal reflux disease)    Hiatal hernia    Hyperlipemia    Hypertension    IBS (irritable bowel syndrome)    Joint pain    Lupus (HCC)    Psoriatic arthritis (HCC)    PTSD (post-traumatic stress disorder)    Sjogren's disease (HCC)    Vitamin D deficiency      Social History   Tobacco Use   Smoking status: Former    Types: Cigarettes    Quit date: 03/09/1999    Years since quitting: 22.9   Smokeless tobacco: Never  Vaping Use   Vaping Use: Never used  Substance Use Topics   Alcohol use: No    Alcohol/week: 0.0 standard drinks of alcohol   Drug use: No    Past  Surgical History:  Procedure Laterality Date   CESAREAN SECTION  1997   FLEXIBLE SIGMOIDOSCOPY N/A 01/06/2017   Procedure: FLEXIBLE SIGMOIDOSCOPY;  Surgeon: Yetta Flock, MD;  Location: [facility];  Service: Gastroenterology;  Laterality: N/A;   OOPHORECTOMY Bilateral    PARTIAL HYSTERECTOMY  07/2009   TEMPOROMANDIBULAR JOINT SURGERY  2009   right side   TONSILLECTOMY  2009    Family History  Problem Relation Age of Onset   Colon polyps Maternal Grandmother    Lung cancer Maternal Grandmother    Liver disease Father        agent orange   Alcoholism Father    Thyroid disease Mother    Diabetes Maternal Grandfather    Heart disease Maternal Grandfather    Hypertension Maternal Grandfather    Colon cancer Maternal Uncle        great uncle   Breast cancer Neg Hx    Esophageal cancer Neg Hx    Stomach cancer Neg Hx    Rectal cancer Neg Hx     Allergies  Allergen Reactions   Lyrica [Pregabalin] Shortness Of Breath   Sulfa Antibiotics Rash and Other (See Comments)   Ciprofloxacin Nausea Only  Gabapentin Other (See Comments)    Stroke like symptoms   Morphine Sulfate     Other reaction(s): Unknown   Nsaids Other (See Comments)    Liver problems- can't take tylenol, ibuprofen etc.   Prednisone Other (See Comments)    "I get deathly ill"   Remicade [Infliximab]    Tolmetin     Other reaction(s): Other (See Comments) Liver problems- can't take tylenol, ibuprofen etc.   Codeine Rash    Other reaction(s): Unknown   Humira [Adalimumab] Rash    Worsening of Lupus rash.   Morphine And Related Rash    "Wired up"   Penicillins Rash    Has patient had a PCN reaction causing immediate rash, facial/tongue/throat swelling, SOB or lightheadedness with hypotension: NO Has patient had a PCN reaction causing severe rash involving mucus membranes or skin necrosis: NO Has patient had a PCN reaction that required hospitalization NO Has patient had a PCN reaction occurring within  the last 10 years: NO If all of the above answers are "NO", then may proceed with Cephalosporin use.    Current Medications:   Current Outpatient Medications:    abatacept (ORENCIA) 250 MG injection, , Disp: , Rfl:    Apremilast (OTEZLA) 10 & 20 & 30 MG TBPK, Take by mouth., Disp: , Rfl:    Ascorbic Acid (VITA-C PO), Take by mouth. 3 gummies a day, Disp: , Rfl:    cephALEXin (KEFLEX) 500 MG capsule, Take 1 capsule (500 mg total) by mouth 2 (two) times daily., Disp: 14 capsule, Rfl: 0   clonazePAM (KLONOPIN) 1 MG tablet, Take 1 tablet (1 mg total) by mouth 3 (three) times daily as needed for anxiety., Disp: 90 tablet, Rfl: 3   cyclobenzaprine (FLEXERIL) 10 MG tablet, Take 1-2 tablets (10-20 mg total) by mouth 3 (three) times daily as needed for muscle spasms., Disp: 60 tablet, Rfl: 0   diphenoxylate-atropine (LOMOTIL) 2.5-0.025 MG tablet, Take 1 tablet by mouth 2 (two) times daily as needed for diarrhea or loose stools., Disp: 60 tablet, Rfl: 2   ferrous sulfate 324 (65 Fe) MG TBEC, Take 1 tablet (324 mg total) by mouth in the morning and at bedtime., Disp: 180 tablet, Rfl: 3   fluticasone (FLONASE) 50 MCG/ACT nasal spray, SPRAY 2 SPRAYS INTO EACH NOSTRIL EVERY DAY, Disp: 48 mL, Rfl: 2   glucose 4 GM chewable tablet, Chew 1 tablet (4 g total) by mouth as needed for low blood sugar., Disp: 50 tablet, Rfl: 1   halobetasol (ULTRAVATE) 0.05 % cream, Apply topically., Disp: , Rfl:    losartan (COZAAR) 100 MG tablet, TAKE 1 TABLET(100 MG) BY MOUTH DAILY, Disp: 30 tablet, Rfl: 5   mesalamine (LIALDA) 1.2 g EC tablet, Take 4 tablets (4.8 g total) by mouth daily., Disp: 360 tablet, Rfl: 3   ondansetron (ZOFRAN) 8 MG tablet, Take 1 tablet (8 mg total) by mouth every 8 (eight) hours as needed for nausea or vomiting., Disp: 20 tablet, Rfl: 0   pantoprazole (PROTONIX) 40 MG tablet, TAKE 1 TABLET(40 MG) BY MOUTH TWICE DAILY, Disp: 180 tablet, Rfl: 1   Potassium 95 MG TABS, Take by mouth., Disp: , Rfl:     promethazine (PHENERGAN) 25 MG tablet, 1 tablet as needed, Disp: , Rfl:    sertraline (ZOLOFT) 100 MG tablet, Take 2 tablets (200 mg total) by mouth daily., Disp: 180 tablet, Rfl: 3   topiramate (TOPAMAX) 100 MG tablet, TAKE 1 TABLET(100 MG) BY MOUTH TWICE DAILY, Disp: 180 tablet, Rfl:  1   valACYclovir (VALTREX) 1000 MG tablet, Take 2 tablets (2,000 mg total) by mouth as needed. For two doses for cold sores., Disp: 20 tablet, Rfl: 0   Vitamin D, Ergocalciferol, (DRISDOL) 1.25 MG (50000 UNIT) CAPS capsule, Take 1 capsule (50,000 Units total) by mouth every 7 (seven) days., Disp: 12 capsule, Rfl: 3   Review of Systems:   Review of Systems  Constitutional:  Positive for fever (due to Lupus).  Gastrointestinal:  Negative for nausea and vomiting.  Genitourinary:  Positive for frequency and hematuria.  Musculoskeletal:  Positive for back pain.       (+) Bilateral leg cramps     Vitals:   Vitals:   01/29/22 1542  BP: 128/86  Pulse: 89  Temp: (!) 97.3 F (36.3 C)  TempSrc: Temporal  SpO2: 95%  Weight: 253 lb 8 oz (115 kg)  Height: 5' 4"  (1.626 m)     Body mass index is 43.51 kg/m.  Physical Exam:   Physical Exam Vitals and nursing note reviewed.  Constitutional:      General: She is not in acute distress.    Appearance: Normal appearance. She is well-developed. She is not ill-appearing or toxic-appearing.  HENT:     Head: Normocephalic and atraumatic.     Right Ear: External ear normal.     Left Ear: External ear normal.  Eyes:     Extraocular Movements: Extraocular movements intact.     Pupils: Pupils are equal, round, and reactive to light.  Cardiovascular:     Rate and Rhythm: Normal rate and regular rhythm.     Pulses: Normal pulses.     Heart sounds: Normal heart sounds, S1 normal and S2 normal. No murmur heard.    No gallop.  Pulmonary:     Effort: Pulmonary effort is normal. No respiratory distress.     Breath sounds: Normal breath sounds. No wheezing.  Abdominal:      Tenderness: There is no right CVA tenderness or left CVA tenderness.  Musculoskeletal:     Comments: No swelling/tenderness/erythema to b/l calves  Skin:    General: Skin is warm and dry.  Neurological:     Mental Status: She is alert and oriented to person, place, and time.     GCS: GCS eye subscore is 4. GCS verbal subscore is 5. GCS motor subscore is 6.  Psychiatric:        Speech: Speech normal.        Behavior: Behavior normal. Behavior is cooperative.        Judgment: Judgment normal.    Results for orders placed or performed in visit on 01/29/22  POCT urinalysis dipstick  Result Value Ref Range   Color, UA amber    Clarity, UA     Glucose, UA Negative Negative   Bilirubin, UA Negative    Ketones, UA Negative    Spec Grav, UA 1.020 1.010 - 1.025   Blood, UA positive    pH, UA 6.0 5.0 - 8.0   Protein, UA Positive (A) Negative   Urobilinogen, UA 0.2 0.2 or 1.0 E.U./dL   Nitrite, UA Negative    Leukocytes, UA Small (1+) (A) Negative   Appearance     Odor      Assessment and Plan:   Urinary urgency No red flags Suspect possible acute cystitis Due to hx of recurrent UTI will empirically treat while we await culture Keflex 500 mg BID x 7 days sent Follow-up if new/worsening Push fluids  Leg  cramp No red flags Discussed checking blood work today but she declined Push fluids, trial diluted gatorade for electrolytes Follow-up with PCP if new/worsening/lack of improvement  I,Param Shah,acting as a Education administrator for [employer], PA.,have documented all relevant documentation on the behalf of Inda Coke, PA,as directed by  Inda Coke, PA while in the presence of Inda Coke, [place].  I, Inda Coke, [place], have reviewed all documentation for this visit. The documentation on 01/29/22 for the exam, diagnosis, procedures, and orders are all accurate and complete.  Inda Coke, PA-C

## 2022-02-01 ENCOUNTER — Telehealth: Payer: Self-pay | Admitting: Family Medicine

## 2022-02-01 LAB — URINE CULTURE
MICRO NUMBER:: 14061834
SPECIMEN QUALITY:: ADEQUATE

## 2022-02-01 NOTE — Telephone Encounter (Signed)
[name]  The only thing that is outstanding is for one of your complex patients (has a TON of med allergies). Melinda Porter [id number] I saw her for UTI and started keflex. The culture is positive but the susceptibility report has not yet returned. I'm going to let Katie/Donna know to look for this and forward results to you when it returns (or to [place] if after 2:30p.) Thanks and have a great weekend!

## 2022-02-01 NOTE — Telephone Encounter (Signed)
Patient states: - She came in on 10/17 for possible UTI.  - She was prescribed keflex but this has caused some stomach upset - States it's not bad enough for her to stop medication  Patient requests: -Confirmation from PCP that medication she was prescribed will treat her UTI properly.

## 2022-02-01 NOTE — Telephone Encounter (Signed)
LMOVM advising patient to complete keflex script

## 2022-02-01 NOTE — Telephone Encounter (Signed)
Patient aware.

## 2022-02-04 ENCOUNTER — Encounter: Payer: Self-pay | Admitting: Physician Assistant

## 2022-02-04 ENCOUNTER — Telehealth: Payer: Self-pay | Admitting: Family Medicine

## 2022-02-04 ENCOUNTER — Ambulatory Visit (INDEPENDENT_AMBULATORY_CARE_PROVIDER_SITE_OTHER): Payer: Commercial Managed Care - PPO | Admitting: Physician Assistant

## 2022-02-04 VITALS — BP 130/90 | HR 76 | Temp 97.8°F | Ht 64.0 in | Wt 254.0 lb

## 2022-02-04 DIAGNOSIS — R399 Unspecified symptoms and signs involving the genitourinary system: Secondary | ICD-10-CM

## 2022-02-04 LAB — CBC WITH DIFFERENTIAL/PLATELET
Basophils Absolute: 0.1 10*3/uL (ref 0.0–0.1)
Basophils Relative: 1 % (ref 0.0–3.0)
Eosinophils Absolute: 0.4 10*3/uL (ref 0.0–0.7)
Eosinophils Relative: 4.1 % (ref 0.0–5.0)
HCT: 39.9 % (ref 36.0–46.0)
Hemoglobin: 12.7 g/dL (ref 12.0–15.0)
Lymphocytes Relative: 22.3 % (ref 12.0–46.0)
Lymphs Abs: 2.2 10*3/uL (ref 0.7–4.0)
MCHC: 31.8 g/dL (ref 30.0–36.0)
MCV: 77.8 fl — ABNORMAL LOW (ref 78.0–100.0)
Monocytes Absolute: 0.6 10*3/uL (ref 0.1–1.0)
Monocytes Relative: 5.7 % (ref 3.0–12.0)
Neutro Abs: 6.7 10*3/uL (ref 1.4–7.7)
Neutrophils Relative %: 66.9 % (ref 43.0–77.0)
Platelets: 136 10*3/uL — ABNORMAL LOW (ref 150.0–400.0)
RBC: 5.12 Mil/uL — ABNORMAL HIGH (ref 3.87–5.11)
RDW: 18.1 % — ABNORMAL HIGH (ref 11.5–15.5)
WBC: 9.9 10*3/uL (ref 4.0–10.5)

## 2022-02-04 LAB — POCT URINALYSIS DIPSTICK
Bilirubin, UA: NEGATIVE
Blood, UA: NEGATIVE
Glucose, UA: NEGATIVE
Ketones, UA: NEGATIVE
Leukocytes, UA: NEGATIVE
Nitrite, UA: NEGATIVE
Protein, UA: NEGATIVE
Spec Grav, UA: 1.015 (ref 1.010–1.025)
Urobilinogen, UA: 0.2 E.U./dL
pH, UA: 6 (ref 5.0–8.0)

## 2022-02-04 LAB — COMPREHENSIVE METABOLIC PANEL
ALT: 18 U/L (ref 0–35)
AST: 17 U/L (ref 0–37)
Albumin: 4.3 g/dL (ref 3.5–5.2)
Alkaline Phosphatase: 66 U/L (ref 39–117)
BUN: 17 mg/dL (ref 6–23)
CO2: 23 mEq/L (ref 19–32)
Calcium: 9.1 mg/dL (ref 8.4–10.5)
Chloride: 109 mEq/L (ref 96–112)
Creatinine, Ser: 0.74 mg/dL (ref 0.40–1.20)
GFR: 94.91 mL/min (ref >60.00)
Glucose, Bld: 103 mg/dL — ABNORMAL HIGH (ref 70–99)
Potassium: 3.7 mEq/L (ref 3.5–5.1)
Sodium: 141 mEq/L (ref 135–145)
Total Bilirubin: 0.3 mg/dL (ref 0.2–1.2)
Total Protein: 6.5 g/dL (ref 6.0–8.3)

## 2022-02-04 NOTE — Patient Instructions (Signed)
It was great to see you!  Update blood work today to check your kidney function  Continue keflex 500 mg twice daily x 3 days  I will be in touch with your urine results and blood work  Take care,  Inda Coke PA-C

## 2022-02-04 NOTE — Progress Notes (Signed)
Melinda Porter is a 51 y.o. female here for a follow up of a pre-existing problem.  History of Present Illness:   Chief Complaint  Patient presents with   Urinary Tract Infection    Pt c/o bilateral flank pain and pressure in your bladder.    HPI  Urinary Tract Infection Patient was seen by me on 01/29/22 for UTI symptoms. Urine culture showed significant e. Coli and pan-sensitive. She is taking keflex 500 mg BID and is on her last dose today. She denies fevers, chills, bladder pain, dysuria, blood in stool, and abdominal tenderness. She is having diarrhea with her abx but states that she has diarrhea daily due to UC. Hx of kidney stones but sx currently are not similar to that.     Past Medical History:  Diagnosis Date   Anemia    Anxiety    Arthritis    Back pain    Bilateral swelling of feet    Depression    Endometriosis    Fatty liver    Fibromyalgia    Fibromyalgia    GERD (gastroesophageal reflux disease)    Hiatal hernia    Hyperlipemia    Hypertension    IBS (irritable bowel syndrome)    Joint pain    Lupus (HCC)    Psoriatic arthritis (HCC)    PTSD (post-traumatic stress disorder)    Sjogren's disease (HCC)    Vitamin D deficiency      Social History   Tobacco Use   Smoking status: Former    Types: Cigarettes    Quit date: 03/09/1999    Years since quitting: 22.9   Smokeless tobacco: Never  Vaping Use   Vaping Use: Never used  Substance Use Topics   Alcohol use: No    Alcohol/week: 0.0 standard drinks of alcohol   Drug use: No    Past Surgical History:  Procedure Laterality Date   CESAREAN SECTION  1997   FLEXIBLE SIGMOIDOSCOPY N/A 01/06/2017   Procedure: FLEXIBLE SIGMOIDOSCOPY;  Surgeon: Yetta Flock, MD;  Location: [facility];  Service: Gastroenterology;  Laterality: N/A;   OOPHORECTOMY Bilateral    PARTIAL HYSTERECTOMY  07/2009   TEMPOROMANDIBULAR JOINT SURGERY  2009   right side   TONSILLECTOMY  2009    Family History   Problem Relation Age of Onset   Colon polyps Maternal Grandmother    Lung cancer Maternal Grandmother    Liver disease Father        agent orange   Alcoholism Father    Thyroid disease Mother    Diabetes Maternal Grandfather    Heart disease Maternal Grandfather    Hypertension Maternal Grandfather    Colon cancer Maternal Uncle        great uncle   Breast cancer Neg Hx    Esophageal cancer Neg Hx    Stomach cancer Neg Hx    Rectal cancer Neg Hx     Allergies  Allergen Reactions   Lyrica [Pregabalin] Shortness Of Breath   Sulfa Antibiotics Rash and Other (See Comments)   Ciprofloxacin Nausea Only   Gabapentin Other (See Comments)    Stroke like symptoms   Morphine Sulfate     Other reaction(s): Unknown   Nsaids Other (See Comments)    Liver problems- can't take tylenol, ibuprofen etc.   Prednisone Other (See Comments)    "I get deathly ill"   Remicade [Infliximab]    Tolmetin     Other reaction(s): Other (See Comments) Liver problems- can't  take tylenol, ibuprofen etc.   Codeine Rash    Other reaction(s): Unknown   Humira [Adalimumab] Rash    Worsening of Lupus rash.   Morphine And Related Rash    "Wired up"   Penicillins Rash    Has patient had a PCN reaction causing immediate rash, facial/tongue/throat swelling, SOB or lightheadedness with hypotension: NO Has patient had a PCN reaction causing severe rash involving mucus membranes or skin necrosis: NO Has patient had a PCN reaction that required hospitalization NO Has patient had a PCN reaction occurring within the last 10 years: NO If all of the above answers are "NO", then may proceed with Cephalosporin use.    Current Medications:   Current Outpatient Medications:    abatacept (ORENCIA) 250 MG injection, , Disp: , Rfl:    Apremilast (OTEZLA) 10 & 20 & 30 MG TBPK, Take by mouth., Disp: , Rfl:    Ascorbic Acid (VITA-C PO), Take by mouth. 3 gummies a day, Disp: , Rfl:    cephALEXin (KEFLEX) 500 MG capsule,  Take 1 capsule (500 mg total) by mouth 2 (two) times daily., Disp: 14 capsule, Rfl: 0   clonazePAM (KLONOPIN) 1 MG tablet, Take 1 tablet (1 mg total) by mouth 3 (three) times daily as needed for anxiety., Disp: 90 tablet, Rfl: 3   cyclobenzaprine (FLEXERIL) 10 MG tablet, Take 1-2 tablets (10-20 mg total) by mouth 3 (three) times daily as needed for muscle spasms., Disp: 60 tablet, Rfl: 0   diphenoxylate-atropine (LOMOTIL) 2.5-0.025 MG tablet, Take 1 tablet by mouth 2 (two) times daily as needed for diarrhea or loose stools., Disp: 60 tablet, Rfl: 2   ferrous sulfate 324 (65 Fe) MG TBEC, Take 1 tablet (324 mg total) by mouth in the morning and at bedtime., Disp: 180 tablet, Rfl: 3   fluticasone (FLONASE) 50 MCG/ACT nasal spray, SPRAY 2 SPRAYS INTO EACH NOSTRIL EVERY DAY, Disp: 48 mL, Rfl: 2   glucose 4 GM chewable tablet, Chew 1 tablet (4 g total) by mouth as needed for low blood sugar., Disp: 50 tablet, Rfl: 1   halobetasol (ULTRAVATE) 0.05 % cream, Apply topically., Disp: , Rfl:    losartan (COZAAR) 100 MG tablet, TAKE 1 TABLET(100 MG) BY MOUTH DAILY, Disp: 30 tablet, Rfl: 5   mesalamine (LIALDA) 1.2 g EC tablet, Take 4 tablets (4.8 g total) by mouth daily., Disp: 360 tablet, Rfl: 3   ondansetron (ZOFRAN) 8 MG tablet, Take 1 tablet (8 mg total) by mouth every 8 (eight) hours as needed for nausea or vomiting., Disp: 20 tablet, Rfl: 0   pantoprazole (PROTONIX) 40 MG tablet, TAKE 1 TABLET(40 MG) BY MOUTH TWICE DAILY, Disp: 180 tablet, Rfl: 1   Potassium 95 MG TABS, Take by mouth., Disp: , Rfl:    promethazine (PHENERGAN) 25 MG tablet, 1 tablet as needed, Disp: , Rfl:    sertraline (ZOLOFT) 100 MG tablet, Take 2 tablets (200 mg total) by mouth daily., Disp: 180 tablet, Rfl: 3   topiramate (TOPAMAX) 100 MG tablet, TAKE 1 TABLET(100 MG) BY MOUTH TWICE DAILY, Disp: 180 tablet, Rfl: 1   valACYclovir (VALTREX) 1000 MG tablet, Take 2 tablets (2,000 mg total) by mouth as needed. For two doses for cold sores.,  Disp: 20 tablet, Rfl: 0   Vitamin D, Ergocalciferol, (DRISDOL) 1.25 MG (50000 UNIT) CAPS capsule, Take 1 capsule (50,000 Units total) by mouth every 7 (seven) days., Disp: 12 capsule, Rfl: 3   Review of Systems:   Review of Systems  Gastrointestinal:  Positive for diarrhea. Negative for abdominal pain and blood in stool.  Musculoskeletal:  Positive for back pain.    Vitals:   Vitals:   02/04/22 0957  BP: (!) 126/90  Pulse: 78  Temp: 97.8 F (36.6 C)  TempSrc: Temporal  SpO2: 96%  Weight: 254 lb (115.2 kg)  Height: 5' 4"  (1.626 m)     Body mass index is 43.6 kg/m.  Physical Exam:   Physical Exam Vitals and nursing note reviewed.  Constitutional:      General: She is not in acute distress.    Appearance: Normal appearance. She is well-developed. She is not ill-appearing or toxic-appearing.  HENT:     Head: Normocephalic and atraumatic.     Right Ear: External ear normal.     Left Ear: External ear normal.  Eyes:     Extraocular Movements: Extraocular movements intact.     Pupils: Pupils are equal, round, and reactive to light.  Cardiovascular:     Rate and Rhythm: Normal rate and regular rhythm.     Pulses: Normal pulses.     Heart sounds: Normal heart sounds, S1 normal and S2 normal. No murmur heard.    No gallop.  Pulmonary:     Effort: Pulmonary effort is normal. No respiratory distress.     Breath sounds: Normal breath sounds. No wheezing or rales.  Abdominal:     Tenderness: There is no abdominal tenderness. There is no right CVA tenderness or left CVA tenderness.  Skin:    General: Skin is warm and dry.  Neurological:     Mental Status: She is alert and oriented to person, place, and time.     GCS: GCS eye subscore is 4. GCS verbal subscore is 5. GCS motor subscore is 6.  Psychiatric:        Speech: Speech normal.        Behavior: Behavior normal. Behavior is cooperative.        Judgment: Judgment normal.    Results for orders placed or performed in  visit on 02/04/22  POCT urinalysis dipstick  Result Value Ref Range   Color, UA yellow    Clarity, UA clear    Glucose, UA Negative Negative   Bilirubin, UA negative    Ketones, UA negative    Spec Grav, UA 1.015 1.010 - 1.025   Blood, UA negative    pH, UA 6.0 5.0 - 8.0   Protein, UA Negative Negative   Urobilinogen, UA 0.2 0.2 or 1.0 E.U./dL   Nitrite, UA negative    Leukocytes, UA Negative Negative   Appearance     Odor      Assessment and Plan:   UTI symptoms No red flags No CVA tenderness and vitals are overall stable We will extend her keflex for 3 more days (she has some at home already and declines refill at this time) If new symptoms, or lack of improvement, will order CT scan for further evaluation -- we will be in touch with her results and go from there, if she has worsening sx, will have her reach out so we can obtain CT   I,Verona Buck,acting as a scribe for [employer], PA.,have documented all relevant documentation on the behalf of Inda Coke, PA,as directed by  Inda Coke, PA while in the presence of Inda Coke, [place].  Inda Coke, PA-C

## 2022-02-04 NOTE — Telephone Encounter (Signed)
Pt is scheduled to see Samantha.

## 2022-02-05 ENCOUNTER — Other Ambulatory Visit: Payer: Self-pay | Admitting: Physician Assistant

## 2022-02-05 ENCOUNTER — Encounter: Payer: Self-pay | Admitting: Physician Assistant

## 2022-02-05 ENCOUNTER — Ambulatory Visit (HOSPITAL_BASED_OUTPATIENT_CLINIC_OR_DEPARTMENT_OTHER)
Admission: RE | Admit: 2022-02-05 | Discharge: 2022-02-05 | Disposition: A | Discharge status: Home / Self Care | Payer: Commercial Managed Care - PPO | Source: Ambulatory Visit | Attending: Physician Assistant | Admitting: Physician Assistant

## 2022-02-05 DIAGNOSIS — R109 Unspecified abdominal pain: Secondary | ICD-10-CM | POA: Insufficient documentation

## 2022-02-05 LAB — URINE CULTURE
MICRO NUMBER:: 14087009
Result:: NO GROWTH
SPECIMEN QUALITY:: ADEQUATE

## 2022-02-07 NOTE — Telephone Encounter (Signed)
She can try pataday drops. Needs visit if not improving.  Melinda Porter. [name], MD 02/07/2022 2:27 PM

## 2022-02-07 NOTE — Telephone Encounter (Signed)
Please see message. °

## 2022-02-08 ENCOUNTER — Encounter: Payer: Self-pay | Admitting: Family

## 2022-02-08 ENCOUNTER — Ambulatory Visit (INDEPENDENT_AMBULATORY_CARE_PROVIDER_SITE_OTHER): Payer: Commercial Managed Care - PPO | Admitting: Family

## 2022-02-08 VITALS — BP 144/86 | HR 81 | Temp 97.6°F | Ht 64.0 in | Wt 251.2 lb

## 2022-02-08 DIAGNOSIS — H1013 Acute atopic conjunctivitis, bilateral: Secondary | ICD-10-CM

## 2022-02-08 MED ORDER — CROMOLYN SODIUM 4 % OP SOLN: 1.0000 [drp] | Freq: Four times a day (QID) | OPHTHALMIC | 1 refills | Status: AC

## 2022-02-08 NOTE — Patient Instructions (Signed)
It was very nice to see you today!    I have sent over antihistamine eyedrops to your pharmacy. Use 1 drop each eye 4 times a day until feeling better. You should feel better after 2-3 days. Call back if they are not better. OK to continue using your  drops for dry eye, but stop using the Clear eye drops.       PLEASE NOTE:  If you had any lab tests please let us know if you have not heard back within a few days. You may see your results on MyChart before we have a chance to review them but we will give you a call once they are reviewed by [place]. If we ordered any referrals today, please let us know if you have not heard from their office within the next week.

## 2022-02-08 NOTE — Progress Notes (Signed)
Patient ID: Melinda Porter, female    DOB: [birth year], 51 y.o.   MRN: [id number]  Chief Complaint  Patient presents with   Eye Problem    sx for a few days    HPI:      Eye irritation:  Pt c/o watery, itchy and burning sensation in bilateral eyes. Has tried OTC eye drop for itching & her eye drops for dry eye which did not help and made her eyes burn more. Pt also has Sjogren's which causes her to have frequent dry eye, but she has never had this before.      Assessment & Plan:  1. Allergic conjunctivitis of both eyes - sending antihistamine droops, advised on use & SE, stop using the OTC Clear eyes,continue using drops for dry eye. Call back if not improving.  - cromolyn (OPTICROM) 4 % ophthalmic solution; Place 1 drop into both eyes 4 (four) times daily.  Dispense: 10 mL; Refill: 1  Subjective:    Outpatient Medications Prior to Visit  Medication Sig Dispense Refill   abatacept (ORENCIA) 250 MG injection      Apremilast (OTEZLA) 10 & 20 & 30 MG TBPK Take by mouth.     Ascorbic Acid (VITA-C PO) Take by mouth. 3 gummies a day     cephALEXin (KEFLEX) 500 MG capsule Take 1 capsule (500 mg total) by mouth 2 (two) times daily. 14 capsule 0   clonazePAM (KLONOPIN) 1 MG tablet Take 1 tablet (1 mg total) by mouth 3 (three) times daily as needed for anxiety. 90 tablet 3   cyclobenzaprine (FLEXERIL) 10 MG tablet Take 1-2 tablets (10-20 mg total) by mouth 3 (three) times daily as needed for muscle spasms. 60 tablet 0   diphenoxylate-atropine (LOMOTIL) 2.5-0.025 MG tablet Take 1 tablet by mouth 2 (two) times daily as needed for diarrhea or loose stools. 60 tablet 2   ferrous sulfate 324 (65 Fe) MG TBEC Take 1 tablet (324 mg total) by mouth in the morning and at bedtime. 180 tablet 3   fluticasone (FLONASE) 50 MCG/ACT nasal spray SPRAY 2 SPRAYS INTO EACH NOSTRIL EVERY DAY 48 mL 2   glucose 4 GM chewable tablet Chew 1 tablet (4 g total) by mouth as needed for low blood sugar. 50 tablet 1    halobetasol (ULTRAVATE) 0.05 % cream Apply topically.     losartan (COZAAR) 100 MG tablet TAKE 1 TABLET(100 MG) BY MOUTH DAILY 30 tablet 5   mesalamine (LIALDA) 1.2 g EC tablet Take 4 tablets (4.8 g total) by mouth daily. 360 tablet 3   ondansetron (ZOFRAN) 8 MG tablet Take 1 tablet (8 mg total) by mouth every 8 (eight) hours as needed for nausea or vomiting. 20 tablet 0   pantoprazole (PROTONIX) 40 MG tablet TAKE 1 TABLET(40 MG) BY MOUTH TWICE DAILY 180 tablet 1   Potassium 95 MG TABS Take by mouth.     promethazine (PHENERGAN) 25 MG tablet 1 tablet as needed     sertraline (ZOLOFT) 100 MG tablet Take 2 tablets (200 mg total) by mouth daily. 180 tablet 3   topiramate (TOPAMAX) 100 MG tablet TAKE 1 TABLET(100 MG) BY MOUTH TWICE DAILY 180 tablet 1   valACYclovir (VALTREX) 1000 MG tablet Take 2 tablets (2,000 mg total) by mouth as needed. For two doses for cold sores. 20 tablet 0   Vitamin D, Ergocalciferol, (DRISDOL) 1.25 MG (50000 UNIT) CAPS capsule Take 1 capsule (50,000 Units total) by mouth every 7 (seven) days. 12 capsule  3   No facility-administered medications prior to visit.   Past Medical History:  Diagnosis Date   Anemia    Anxiety    Arthritis    Back pain    Bilateral swelling of feet    Depression    Endometriosis    Fatty liver    Fibromyalgia    Fibromyalgia    GERD (gastroesophageal reflux disease)    Hiatal hernia    Hyperlipemia    Hypertension    IBS (irritable bowel syndrome)    Joint pain    Lupus (HCC)    Psoriatic arthritis (HCC)    PTSD (post-traumatic stress disorder)    Sjogren's disease ([place])    Vitamin D deficiency    Past Surgical History:  Procedure Laterality Date   CESAREAN SECTION  1997   FLEXIBLE SIGMOIDOSCOPY N/A 01/06/2017   Procedure: FLEXIBLE SIGMOIDOSCOPY;  Surgeon: Yetta Flock, MD;  Location: [facility];  Service: Gastroenterology;  Laterality: N/A;   OOPHORECTOMY Bilateral    PARTIAL HYSTERECTOMY  07/2009    TEMPOROMANDIBULAR JOINT SURGERY  2009   right side   TONSILLECTOMY  2009   Allergies  Allergen Reactions   Lyrica [Pregabalin] Shortness Of Breath   Sulfa Antibiotics Rash and Other (See Comments)   Ciprofloxacin Nausea Only   Gabapentin Other (See Comments)    Stroke like symptoms   Morphine Sulfate     Other reaction(s): Unknown   Nsaids Other (See Comments)    Liver problems- can't take tylenol, ibuprofen etc.   Prednisone Other (See Comments)    "I get deathly ill"   Remicade [Infliximab]    Tolmetin     Other reaction(s): Other (See Comments) Liver problems- can't take tylenol, ibuprofen etc.   Codeine Rash    Other reaction(s): Unknown   Humira [Adalimumab] Rash    Worsening of Lupus rash.   Morphine And Related Rash    "Wired up"   Penicillins Rash    Has patient had a PCN reaction causing immediate rash, facial/tongue/throat swelling, SOB or lightheadedness with hypotension: NO Has patient had a PCN reaction causing severe rash involving mucus membranes or skin necrosis: NO Has patient had a PCN reaction that required hospitalization NO Has patient had a PCN reaction occurring within the last 10 years: NO If all of the above answers are "NO", then may proceed with Cephalosporin use.      Objective:    Physical Exam Vitals and nursing note reviewed.  Constitutional:      Appearance: Normal appearance.  Eyes:     General:        Right eye: No foreign body.        Left eye: No foreign body.     Conjunctiva/sclera:     Right eye: Right conjunctiva is injected (sclera & conjunctiva).     Left eye: Left conjunctiva is injected (sclera & conjunctiva).  Cardiovascular:     Rate and Rhythm: Normal rate and regular rhythm.  Pulmonary:     Effort: Pulmonary effort is normal.     Breath sounds: Normal breath sounds.  Musculoskeletal:        General: Normal range of motion.  Skin:    General: Skin is warm and dry.  Neurological:     Mental Status: She is alert.   Psychiatric:        Mood and Affect: Mood normal.        Behavior: Behavior normal.    BP (!) 144/86 (BP Location: Left Arm,  Patient Position: Sitting, Cuff Size: Large)   Pulse 81   Temp 97.6 F (36.4 C) (Temporal)   Ht 5' 4"  (1.626 m)   Wt 251 lb 4 oz (114 kg)   LMP 06/23/2009   SpO2 91%   BMI 43.13 kg/m  Wt Readings from Last 3 Encounters:  02/08/22 251 lb 4 oz (114 kg)  02/04/22 254 lb (115.2 kg)  01/29/22 253 lb 8 oz (115 kg)       Jeanie Sewer, NP

## 2022-02-11 ENCOUNTER — Telehealth: Payer: Self-pay | Admitting: Internal Medicine

## 2022-02-11 NOTE — Telephone Encounter (Signed)
Inbound call from patient calling to inform of her having a enlarged spling. Nurse advised OV with PA  FOR 11/14 AT 1:30, Patient confirms apt.  Thank you

## 2022-02-26 ENCOUNTER — Ambulatory Visit (INDEPENDENT_AMBULATORY_CARE_PROVIDER_SITE_OTHER): Payer: Commercial Managed Care - PPO | Admitting: Physician Assistant

## 2022-02-26 ENCOUNTER — Encounter: Payer: Self-pay | Admitting: Physician Assistant

## 2022-02-26 VITALS — BP 132/70 | HR 93 | Ht 64.0 in | Wt 252.5 lb

## 2022-02-26 DIAGNOSIS — R161 Splenomegaly, not elsewhere classified: Secondary | ICD-10-CM

## 2022-02-26 DIAGNOSIS — R1011 Right upper quadrant pain: Secondary | ICD-10-CM | POA: Diagnosis not present

## 2022-02-26 DIAGNOSIS — K760 Fatty (change of) liver, not elsewhere classified: Secondary | ICD-10-CM | POA: Diagnosis not present

## 2022-02-26 DIAGNOSIS — K5120 Ulcerative (chronic) proctitis without complications: Secondary | ICD-10-CM

## 2022-02-26 NOTE — Patient Instructions (Signed)
_______________________________________________________  If you are age 49 or older, your body mass index should be between 23-30. Your Body mass index is 43.34 kg/m. If this is out of the aforementioned range listed, please consider follow up with your Primary Care Provider.  If you are age 62 or younger, your body mass index should be between 19-25. Your Body mass index is 43.34 kg/m. If this is out of the aformentioned range listed, please consider follow up with your Primary Care Provider.   ________________________________________________________  The [facility] providers would like to encourage you to use [facility] to communicate with providers for non-urgent requests or questions.  Due to long hold times on the telephone, sending your provider a message by [facility] may be a faster and more efficient way to get a response.  Please allow 48 business hours for a response.  Please remember that this is for non-urgent requests.  _______________________________________________________  Melinda Porter will be contacted by [facility] Scheduling in the next 2 days to arrange an abdominal ultrasound.  The number on your caller ID will be [phone], please answer when they call.  If you have not heard from them in 2 days please call [phone] to schedule.

## 2022-02-26 NOTE — Progress Notes (Signed)
Chief Complaint: Right-sided abdominal pain  HPI:    Melinda Porter is a 51 year old female, known to Dr. Henrene Pastor, with a past medical history as listed below including mild ulcerative colitis diagnosed in October 2021, anxiety, fibromyalgia, GERD, IBS, Sjogren's and multiple others, who presents to clinic today with a complaint of right-sided abdominal pain.    01/17/2020 colonoscopy with patchy mild erythema throughout.  Repeat recommended in 5 years.  Biopsies showed ulcerative colitis and patient started on Lialda 4.8 g daily.    01/17/2020 EGD was essentially normal with some inflammatory polyps in the stomach.    10/23/2021 patient seen in clinic by Alonza Bogus.  At that time noted her ulcerative colitis was seemingly stable on Lialda 4.8 g a day.  She was therefore reflux.  Noted she was intolerant to Prednisone allergic to Humira.  She had an elevated lipase level of 269 one-time evaluation.  CT scan had not shown pancreatitis.  Also noted to have splenomegaly.  This was there since 2018.  At that time an IgG4 was checked.  Continued on Lialda 4.8 g a day.    02/04/2022 CMP normal.  CBC with platelets minimally decreased at 136.  (Baseline).    02/05/2022 CT renal stone study with nonobstructing right sided kidney stone in the superior collecting system and splenomegaly as well as steatosis.  (Spleen had increased in size slightly, 10/15/2021 CT showed spleen 13 x 12.6 x 7.7 cm and this time greater than 15 cm) at that time PCP wanted to try some Flomax to see if that helped.    Today, patient presents clinic with multiple complaints.  Tells me that her spleen enlarged more since last imaging and she is concerned about that.  Wants to know if there is anything she could do.  Describes mono as a young child and multiple autoimmune diseases.  Also has a sister with an enlarged spleen but they are not exactly sure why.    Also discusses her fatty liver.  Tells me there has been fat on it for years even when  she weighed much less.    Also discusses some right upper quadrant pain which seems to come and go and can be related to movement or not.  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    Denies fever, chills, weight loss, blood in her stool, change in bowel habits, nausea or vomiting.  Past Medical History:  Diagnosis Date   Anemia    Anxiety    Arthritis    Back pain    Bilateral swelling of feet    Depression    Endometriosis    Fatty liver    Fibromyalgia    Fibromyalgia    GERD (gastroesophageal reflux disease)    Hiatal hernia    Hyperlipemia    Hypertension    IBS (irritable bowel syndrome)    Joint pain    Lupus (HCC)    Psoriatic arthritis (HCC)    PTSD (post-traumatic stress disorder)    Sjogren's disease ([place])    Vitamin D deficiency     Past Surgical History:  Procedure Laterality Date   CESAREAN SECTION  1997   FLEXIBLE SIGMOIDOSCOPY N/A 01/06/2017   Procedure: FLEXIBLE SIGMOIDOSCOPY;  Surgeon: Yetta Flock, MD;  Location: [facility];  Service: Gastroenterology;  Laterality: N/A;   OOPHORECTOMY Bilateral    PARTIAL HYSTERECTOMY  07/2009   TEMPOROMANDIBULAR JOINT SURGERY  2009   right side  TONSILLECTOMY  2009    Current Outpatient Medications  Medication Sig Dispense Refill   abatacept (ORENCIA) 250 MG injection      Apremilast (OTEZLA) 10 & 20 & 30 MG TBPK Take by mouth.     Ascorbic Acid (VITA-C PO) Take by mouth. 3 gummies a day     cephALEXin (KEFLEX) 500 MG capsule Take 1 capsule (500 mg total) by mouth 2 (two) times daily. 14 capsule 0   clonazePAM (KLONOPIN) 1 MG tablet Take 1 tablet (1 mg total) by mouth 3 (three) times daily as needed for anxiety. 90 tablet 3   cromolyn (OPTICROM) 4 % ophthalmic solution Place 1 drop into both eyes 4 (four) times daily. 10 mL 1   cyclobenzaprine (FLEXERIL) 10 MG tablet Take 1-2 tablets (10-20 mg total) by mouth 3 (three) times daily as needed for muscle spasms. 60 tablet 0    diphenoxylate-atropine (LOMOTIL) 2.5-0.025 MG tablet Take 1 tablet by mouth 2 (two) times daily as needed for diarrhea or loose stools. 60 tablet 2   ferrous sulfate 324 (65 Fe) MG TBEC Take 1 tablet (324 mg total) by mouth in the morning and at bedtime. 180 tablet 3   fluticasone (FLONASE) 50 MCG/ACT nasal spray SPRAY 2 SPRAYS INTO EACH NOSTRIL EVERY DAY 48 mL 2   glucose 4 GM chewable tablet Chew 1 tablet (4 g total) by mouth as needed for low blood sugar. 50 tablet 1   halobetasol (ULTRAVATE) 0.05 % cream Apply topically.     losartan (COZAAR) 100 MG tablet TAKE 1 TABLET(100 MG) BY MOUTH DAILY 30 tablet 5   mesalamine (LIALDA) 1.2 g EC tablet Take 4 tablets (4.8 g total) by mouth daily. 360 tablet 3   ondansetron (ZOFRAN) 8 MG tablet Take 1 tablet (8 mg total) by mouth every 8 (eight) hours as needed for nausea or vomiting. 20 tablet 0   pantoprazole (PROTONIX) 40 MG tablet TAKE 1 TABLET(40 MG) BY MOUTH TWICE DAILY 180 tablet 1   Potassium 95 MG TABS Take by mouth.     promethazine (PHENERGAN) 25 MG tablet 1 tablet as needed     sertraline (ZOLOFT) 100 MG tablet Take 2 tablets (200 mg total) by mouth daily. 180 tablet 3   topiramate (TOPAMAX) 100 MG tablet TAKE 1 TABLET(100 MG) BY MOUTH TWICE DAILY 180 tablet 1   valACYclovir (VALTREX) 1000 MG tablet Take 2 tablets (2,000 mg total) by mouth as needed. For two doses for cold sores. 20 tablet 0   Vitamin D, Ergocalciferol, (DRISDOL) 1.25 MG (50000 UNIT) CAPS capsule Take 1 capsule (50,000 Units total) by mouth every 7 (seven) days. 12 capsule 3   No current facility-administered medications for this visit.    Allergies as of 02/26/2022 - Review Complete 02/08/2022  Allergen Reaction Noted   Lyrica [pregabalin] Shortness Of Breath 06/14/2016   Sulfa antibiotics Rash and Other (See Comments) 11/28/2011   Ciprofloxacin Nausea Only 11/27/2015   Gabapentin Other (See Comments) 11/27/2015   Morphine sulfate  02/22/2021   Nsaids Other (See  Comments) 11/04/2014   Prednisone Other (See Comments) 02/22/2021   Remicade [infliximab]  08/21/2020   Tolmetin  07/18/2015   Codeine Rash 11/28/2011   Humira [adalimumab] Rash 01/05/2017   Morphine and related Rash 11/28/2011   Penicillins Rash 10/25/2012    Family History  Problem Relation Age of Onset   Colon polyps Maternal Grandmother    Lung cancer Maternal Grandmother    Liver disease Father  agent orange   Alcoholism Father    Thyroid disease Mother    Diabetes Maternal Grandfather    Heart disease Maternal Grandfather    Hypertension Maternal Grandfather    Colon cancer Maternal Uncle        great uncle   Breast cancer Neg Hx    Esophageal cancer Neg Hx    Stomach cancer Neg Hx    Rectal cancer Neg Hx     Social History   Socioeconomic History   Marital status: Widowed    Spouse name: Not on file   Number of children: 1   Years of education: Not on file   Highest education level: Not on file  Occupational History   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   Social Determinants of Health   Financial Resource Strain: Not on file  Food Insecurity: Not on file  Transportation Needs: Not on file  Physical Activity: Not on file  Stress: Not on file  Social Connections: Not on file  Intimate Partner Violence: Not on file    Review of Systems:    Constitutional: No weight loss, fever or chills Cardiovascular: No chest pain Respiratory: No SOB  Gastrointestinal: See HPI and otherwise negative   Physical  Exam:  Vital signs: BP 132/70   Pulse 93   Ht 5' 4"  (1.626 m)   Wt 252 lb 8 oz (114.5 kg)   LMP 06/23/2009   BMI 43.34 kg/m    Constitutional:   Pleasant obese Caucasian female appears to be in NAD, Well developed, Well nourished, alert and cooperative Respiratory: Respirations even and unlabored. Lungs clear to auscultation bilaterally.   No wheezes, crackles, or rhonchi.  Cardiovascular: Normal S1, S2. No MRG. Regular rate and rhythm. No peripheral edema, cyanosis or pallor.  Gastrointestinal:  Soft, nondistended, moderate right upper quadrant TTP over ribs/under, no rebound or guarding. Normal bowel sounds. No appreciable masses or hepatomegaly. Rectal:  Not performed.  Psychiatric: Oriented to person, place and time. Demonstrates good judgement and reason without abnormal affect or behaviors.  RELEVANT LABS AND IMAGING: CBC    Component Value Date/Time   WBC 9.9 02/04/2022 1021   RBC 5.12 (H) 02/04/2022 1021   HGB 12.7 02/04/2022 1021   HGB 11.8 07/12/2019 1249   HCT 39.9 02/04/2022 1021   HCT 39.5 07/12/2019 1249   PLT 136.0 (L) 02/04/2022 1021   PLT 187 07/12/2019 1249   MCV 77.8 (L) 02/04/2022 1021   MCV 75 (L) 07/12/2019 1249   MCH 20.4 (L) 07/28/2020 1353   MCHC 31.8 02/04/2022 1021   RDW 18.1 (H) 02/04/2022 1021   RDW 15.9 (H) 07/12/2019 1249   LYMPHSABS 2.2 02/04/2022 1021   LYMPHSABS 2.4 07/12/2019 1249   MONOABS 0.6 02/04/2022 1021   EOSABS 0.4 02/04/2022 1021   EOSABS 0.3 07/12/2019 1249   BASOSABS 0.1 02/04/2022 1021   BASOSABS 0.2 07/12/2019 1249    CMP     Component Value Date/Time  NA 141 02/04/2022 1021   NA 143 07/12/2019 1249   K 3.7 02/04/2022 1021   CL 109 02/04/2022 1021   CO2 23 02/04/2022 1021   GLUCOSE 103 (H) 02/04/2022 1021   BUN 17 02/04/2022 1021   BUN 11 11/27/2020 0000   CREATININE 0.74 02/04/2022 1021   CREATININE 0.70 04/09/2017 1619   CALCIUM 9.1 02/04/2022 1021   PROT 6.5 02/04/2022 1021   PROT 6.6 07/12/2019 1249    ALBUMIN 4.3 02/04/2022 1021   ALBUMIN 4.5 07/12/2019 1249   AST 17 02/04/2022 1021   ALT 18 02/04/2022 1021   ALKPHOS 66 02/04/2022 1021   BILITOT 0.3 02/04/2022 1021   BILITOT 0.2 07/12/2019 1249   GFRNONAA 107 11/27/2020 0000   GFRNONAA >60 07/28/2020 1353   GFRAA 116 07/12/2019 1249    Assessment: 1.  Right upper quadrant pain: Off-and-on, sometimes related to food and other times not; consider relation to gallbladder etiology versus musculoskeletal 2.  Splenomegaly with thrombocytopenia: Uncertain etiology, no cirrhosis on imaging 3.  Fatty liver: Seen on recent imaging, normal LFTs 4.  Ulcerative colitis: Controlled on Lialda 4.8 g/day, no change in bowel habits recently  Plan: 1.  Ordered right upper quadrant ultrasound for further eval of right upper quadrant pain. 2.  Discussed splenomegaly.  There is nothing to be done at this point, he likely needs to be followed with imaging every 6 months or so.  Certainly if her platelets worsened or she had other symptoms then may need to consider further eval. 3.  Discussed fatty liver with recommendations for slow and steady weight loss of 1 to 2 pounds per week.  Discussed healthy diet. 4.  Patient to follow in clinic per recommendations after imaging.  Ellouise Newer, PA-C [facility] 02/26/2022, 1:28 PM  Cc: Vivi Barrack, MD

## 2022-02-26 NOTE — Progress Notes (Signed)
Noted  

## 2022-03-01 ENCOUNTER — Telehealth: Payer: Self-pay | Admitting: Family Medicine

## 2022-03-01 ENCOUNTER — Telehealth: Payer: Commercial Managed Care - PPO | Admitting: Physician Assistant

## 2022-03-01 DIAGNOSIS — U071 COVID-19: Secondary | ICD-10-CM

## 2022-03-01 DIAGNOSIS — Z1231 Encounter for screening mammogram for malignant neoplasm of breast: Secondary | ICD-10-CM

## 2022-03-01 NOTE — Progress Notes (Signed)
Virtual Visit Consent   Melinda Porter, you are scheduled for a virtual visit with a [facility] provider today. Just as with appointments in the office, your consent must be obtained to participate. Your consent will be active for this visit and any virtual visit you may have with one of our providers in the next 365 days. If you have a MyChart account, a copy of this consent can be sent to you electronically.  As this is a virtual visit, video technology does not allow for your provider to perform a traditional examination. This may limit your provider's ability to fully assess your condition. If your provider identifies any concerns that need to be evaluated in person or the need to arrange testing (such as labs, EKG, etc.), we will make arrangements to do so. Although advances in technology are sophisticated, we cannot ensure that it will always work on either your end or our end. If the connection with a video visit is poor, the visit may have to be switched to a telephone visit. With either a video or telephone visit, we are not always able to ensure that we have a secure connection.  By engaging in this virtual visit, you consent to the provision of healthcare and authorize for your insurance to be billed (if applicable) for the services provided during this visit. Depending on your insurance coverage, you may receive a charge related to this service.  I need to obtain your verbal consent now. Are you willing to proceed with your visit today? Melinda Porter has provided verbal consent on 03/01/2022 for a virtual visit (video or telephone). Mar Daring, PA-C  Date: 03/01/2022 9:39 AM  Virtual Visit via Video Note   I, Mar Daring, connected with  Melinda Porter  ([id number], [birth year]) on 03/01/22 at  9:30 AM EST by a video-enabled telemedicine application and verified that I am speaking with the correct person using two identifiers.  Location: Patient: Virtual Visit Location  Patient: Home Provider: Virtual Visit Location Provider: Home Office   I discussed the limitations of evaluation and management by telemedicine and the availability of in person appointments. The patient expressed understanding and agreed to proceed.    History of Present Illness: Melinda Porter is a 51 y.o. who identifies as a female who was assigned female at birth, and is being seen today for cough and chest congestion.  HPI: Cough This is a new problem. The current episode started yesterday. The problem has been gradually worsening. The problem occurs constantly. The cough is Productive of sputum. Associated symptoms include chest pain, a fever (low grade), headaches, myalgias, nasal congestion, postnasal drip and a sore throat (burning). Pertinent negatives include no ear pain or rhinorrhea. Nothing aggravates the symptoms. She has tried nothing for the symptoms. The treatment provided no relief.  Covid 19 at home testing is positive  Problems:  Patient Active Problem List   Diagnosis Date Noted   Sleep-disordered breathing 11/12/2021   Elevated lipase 10/23/2021   Prediabetes 01/01/2021   Morbid obesity ([place]) 07/10/2020   Depression, major, single episode, moderate ([place]) 05/29/2020   Anxiety 05/29/2020   PTSD (post-traumatic stress disorder) 05/29/2020   Vitamin D deficiency 05/29/2020   Iron deficiency anemia 05/29/2020   GERD (gastroesophageal reflux disease) 05/29/2020   Sjogren's syndrome ([place]) 05/29/2020   Seasonal allergies 05/29/2020   Vitamin B12 deficiency 05/29/2020   Cold sore 05/29/2020   Systemic lupus erythematosus ([place]) 12/29/2019   Ulcerative colitis ([place]) 01/05/2017  Persistent headaches 11/05/2014   Facial droop 11/05/2014   Syncope 11/05/2014   Hypertension 11/05/2014   Hyperlipemia 11/05/2014   Psoriatic arthritis ([place]) 11/05/2014    Allergies:  Allergies  Allergen Reactions   Lyrica [Pregabalin] Shortness Of Breath   Sulfa Antibiotics Rash and  Other (See Comments)   Ciprofloxacin Nausea Only   Gabapentin Other (See Comments)    Stroke like symptoms   Morphine Sulfate     Other reaction(s): Unknown   Nsaids Other (See Comments)    Liver problems- can't take tylenol, ibuprofen etc.   Prednisone Other (See Comments)    "I get deathly ill"   Remicade [Infliximab]    Tolmetin     Other reaction(s): Other (See Comments) Liver problems- can't take tylenol, ibuprofen etc.   Codeine Rash    Other reaction(s): Unknown   Humira [Adalimumab] Rash    Worsening of Lupus rash.   Morphine And Related Rash    "Wired up"   Penicillins Rash    Has patient had a PCN reaction causing immediate rash, facial/tongue/throat swelling, SOB or lightheadedness with hypotension: NO Has patient had a PCN reaction causing severe rash involving mucus membranes or skin necrosis: NO Has patient had a PCN reaction that required hospitalization NO Has patient had a PCN reaction occurring within the last 10 years: NO If all of the above answers are "NO", then may proceed with Cephalosporin use.   Medications:  Current Outpatient Medications:    abatacept (ORENCIA) 250 MG injection, , Disp: , Rfl:    Apremilast (OTEZLA) 10 & 20 & 30 MG TBPK, Take by mouth., Disp: , Rfl:    Ascorbic Acid (VITA-C PO), Take by mouth. 3 gummies a day, Disp: , Rfl:    cephALEXin (KEFLEX) 500 MG capsule, Take 1 capsule (500 mg total) by mouth 2 (two) times daily., Disp: 14 capsule, Rfl: 0   clonazePAM (KLONOPIN) 1 MG tablet, Take 1 tablet (1 mg total) by mouth 3 (three) times daily as needed for anxiety., Disp: 90 tablet, Rfl: 3   cromolyn (OPTICROM) 4 % ophthalmic solution, Place 1 drop into both eyes 4 (four) times daily., Disp: 10 mL, Rfl: 1   cyclobenzaprine (FLEXERIL) 10 MG tablet, Take 1-2 tablets (10-20 mg total) by mouth 3 (three) times daily as needed for muscle spasms., Disp: 60 tablet, Rfl: 0   diphenoxylate-atropine (LOMOTIL) 2.5-0.025 MG tablet, Take 1 tablet by mouth  2 (two) times daily as needed for diarrhea or loose stools., Disp: 60 tablet, Rfl: 2   ferrous sulfate 324 (65 Fe) MG TBEC, Take 1 tablet (324 mg total) by mouth in the morning and at bedtime., Disp: 180 tablet, Rfl: 3   fluticasone (FLONASE) 50 MCG/ACT nasal spray, SPRAY 2 SPRAYS INTO EACH NOSTRIL EVERY DAY, Disp: 48 mL, Rfl: 2   glucose 4 GM chewable tablet, Chew 1 tablet (4 g total) by mouth as needed for low blood sugar., Disp: 50 tablet, Rfl: 1   halobetasol (ULTRAVATE) 0.05 % cream, Apply topically., Disp: , Rfl:    losartan (COZAAR) 100 MG tablet, TAKE 1 TABLET(100 MG) BY MOUTH DAILY, Disp: 30 tablet, Rfl: 5   mesalamine (LIALDA) 1.2 g EC tablet, Take 4 tablets (4.8 g total) by mouth daily., Disp: 360 tablet, Rfl: 3   ondansetron (ZOFRAN) 8 MG tablet, Take 1 tablet (8 mg total) by mouth every 8 (eight) hours as needed for nausea or vomiting., Disp: 20 tablet, Rfl: 0   pantoprazole (PROTONIX) 40 MG tablet, TAKE 1 TABLET(40  MG) BY MOUTH TWICE DAILY, Disp: 180 tablet, Rfl: 1   Potassium 95 MG TABS, Take by mouth., Disp: , Rfl:    promethazine (PHENERGAN) 25 MG tablet, 1 tablet as needed, Disp: , Rfl:    sertraline (ZOLOFT) 100 MG tablet, Take 2 tablets (200 mg total) by mouth daily., Disp: 180 tablet, Rfl: 3   topiramate (TOPAMAX) 100 MG tablet, TAKE 1 TABLET(100 MG) BY MOUTH TWICE DAILY, Disp: 180 tablet, Rfl: 1   valACYclovir (VALTREX) 1000 MG tablet, Take 2 tablets (2,000 mg total) by mouth as needed. For two doses for cold sores., Disp: 20 tablet, Rfl: 0   Vitamin D, Ergocalciferol, (DRISDOL) 1.25 MG (50000 UNIT) CAPS capsule, Take 1 capsule (50,000 Units total) by mouth every 7 (seven) days., Disp: 12 capsule, Rfl: 3  Observations/Objective: Patient is well-developed, well-nourished in no acute distress.  Resting comfortably at home.  Head is normocephalic, atraumatic.  No labored breathing.  Speech is clear and coherent with logical content.  Patient is alert and oriented at baseline.     Assessment and Plan: 1. COVID-19 - MyChart COVID-19 home monitoring program; Future  - Continue OTC symptomatic management of choice - Will send OTC vitamins and supplement information through AVS - Patient enrolled in MyChart symptom monitoring - Push fluids - Rest as needed - Discussed return precautions and when to seek in-person evaluation, sent via AVS as well   Follow Up Instructions: I discussed the assessment and treatment plan with the patient. The patient was provided an opportunity to ask questions and all were answered. The patient agreed with the plan and demonstrated an understanding of the instructions.  A copy of instructions were sent to the patient via MyChart unless otherwise noted below.    The patient was advised to call back or seek an in-person evaluation if the symptoms worsen or if the condition fails to improve as anticipated.  Time:  I spent 13 minutes with the patient via telehealth technology discussing the above problems/concerns.    Mar Daring, PA-C

## 2022-03-01 NOTE — Patient Instructions (Signed)
Melinda Porter, thank you for joining Mar Daring, PA-C for today's virtual visit.  While this provider is not your primary care provider (PCP), if your PCP is located in our provider database this encounter information will be shared with them immediately following your visit.   [facility] account gives you access to today's visit and all your visits, tests, and labs performed at [facility] " click here if you don't have a [facility] account or go to [email or web address]  Consent: (Patient) Melinda Porter provided verbal consent for this virtual visit at the beginning of the encounter.  Current Medications:  Current Outpatient Medications:    abatacept (ORENCIA) 250 MG injection, , Disp: , Rfl:    Apremilast (OTEZLA) 10 & 20 & 30 MG TBPK, Take by mouth., Disp: , Rfl:    Ascorbic Acid (VITA-C PO), Take by mouth. 3 gummies a day, Disp: , Rfl:    cephALEXin (KEFLEX) 500 MG capsule, Take 1 capsule (500 mg total) by mouth 2 (two) times daily., Disp: 14 capsule, Rfl: 0   clonazePAM (KLONOPIN) 1 MG tablet, Take 1 tablet (1 mg total) by mouth 3 (three) times daily as needed for anxiety., Disp: 90 tablet, Rfl: 3   cromolyn (OPTICROM) 4 % ophthalmic solution, Place 1 drop into both eyes 4 (four) times daily., Disp: 10 mL, Rfl: 1   cyclobenzaprine (FLEXERIL) 10 MG tablet, Take 1-2 tablets (10-20 mg total) by mouth 3 (three) times daily as needed for muscle spasms., Disp: 60 tablet, Rfl: 0   diphenoxylate-atropine (LOMOTIL) 2.5-0.025 MG tablet, Take 1 tablet by mouth 2 (two) times daily as needed for diarrhea or loose stools., Disp: 60 tablet, Rfl: 2   ferrous sulfate 324 (65 Fe) MG TBEC, Take 1 tablet (324 mg total) by mouth in the morning and at bedtime., Disp: 180 tablet, Rfl: 3   fluticasone (FLONASE) 50 MCG/ACT nasal spray, SPRAY 2 SPRAYS INTO EACH NOSTRIL EVERY DAY, Disp: 48 mL, Rfl: 2   glucose 4 GM chewable tablet, Chew 1 tablet (4 g total) by mouth as  needed for low blood sugar., Disp: 50 tablet, Rfl: 1   halobetasol (ULTRAVATE) 0.05 % cream, Apply topically., Disp: , Rfl:    losartan (COZAAR) 100 MG tablet, TAKE 1 TABLET(100 MG) BY MOUTH DAILY, Disp: 30 tablet, Rfl: 5   mesalamine (LIALDA) 1.2 g EC tablet, Take 4 tablets (4.8 g total) by mouth daily., Disp: 360 tablet, Rfl: 3   ondansetron (ZOFRAN) 8 MG tablet, Take 1 tablet (8 mg total) by mouth every 8 (eight) hours as needed for nausea or vomiting., Disp: 20 tablet, Rfl: 0   pantoprazole (PROTONIX) 40 MG tablet, TAKE 1 TABLET(40 MG) BY MOUTH TWICE DAILY, Disp: 180 tablet, Rfl: 1   Potassium 95 MG TABS, Take by mouth., Disp: , Rfl:    promethazine (PHENERGAN) 25 MG tablet, 1 tablet as needed, Disp: , Rfl:    sertraline (ZOLOFT) 100 MG tablet, Take 2 tablets (200 mg total) by mouth daily., Disp: 180 tablet, Rfl: 3   topiramate (TOPAMAX) 100 MG tablet, TAKE 1 TABLET(100 MG) BY MOUTH TWICE DAILY, Disp: 180 tablet, Rfl: 1   valACYclovir (VALTREX) 1000 MG tablet, Take 2 tablets (2,000 mg total) by mouth as needed. For two doses for cold sores., Disp: 20 tablet, Rfl: 0   Vitamin D, Ergocalciferol, (DRISDOL) 1.25 MG (50000 UNIT) CAPS capsule, Take 1 capsule (50,000 Units total) by mouth every 7 (seven) days., Disp: 12 capsule, Rfl: 3  Medications ordered in this encounter:  No orders of the defined types were placed in this encounter.    *If you need refills on other medications prior to your next appointment, please contact your pharmacy*  Follow-Up: Call back or seek an in-person evaluation if the symptoms worsen or if the condition fails to improve as anticipated.  [facility] [phone]  Other Instructions  COVID-19 COVID-19, or coronavirus disease 2019, is an infection that is caused by a new (novel) coronavirus called SARS-CoV-2. COVID-19 can cause many symptoms. In some people, the virus may not cause any symptoms. In others, it may cause mild or severe symptoms.  Some people with severe infection develop severe disease. What are the causes? This illness is caused by a virus. The virus may be in the air as tiny specks of fluid (aerosols) or droplets, or it may be on surfaces. You may catch the virus by: Breathing in droplets from an infected person. Droplets can be spread by a person breathing, speaking, singing, coughing, or sneezing. Touching something, like a table or a doorknob, that has virus on it (is contaminated) and then touching your mouth, nose, or eyes. What increases the risk? Risk for infection: You are more likely to get infected with the COVID-19 virus if: You are within 6 ft (1.8 m) of a person with COVID-19 for 15 minutes or longer. You are providing care for a person who is infected with COVID-19. You are in close personal contact with other people. Close personal contact includes hugging, kissing, or sharing eating or drinking utensils. Risk for serious illness caused by COVID-19: You are more likely to get seriously ill from the COVID-19 virus if: You have cancer. You have a long-term (chronic) disease, such as: Chronic lung disease. This includes pulmonary embolism, chronic obstructive pulmonary disease, and cystic fibrosis. Long-term disease that lowers your body's ability to fight infection (immunocompromise). Serious cardiac conditions, such as heart failure, coronary artery disease, or cardiomyopathy. Diabetes. Chronic kidney disease. Liver diseases. These include cirrhosis, nonalcoholic fatty liver disease, alcoholic liver disease, or autoimmune hepatitis. You have obesity. You are pregnant or were recently pregnant. You have sickle cell disease. What are the signs or symptoms? Symptoms of this condition can range from mild to severe. Symptoms may appear any time from 2 to 14 days after being exposed to the virus. They include: Fever or chills. Shortness of breath or trouble breathing. Feeling tired or very  tired. Headaches, body aches, or muscle aches. Runny or stuffy nose, sneezing, coughing, or sore throat. New loss of taste or smell. This is rare. Some people may also have stomach problems, such as nausea, vomiting, or diarrhea. Other people may not have any symptoms of COVID-19. How is this diagnosed? This condition may be diagnosed by testing samples to check for the COVID-19 virus. The most common tests are the PCR test and the antigen test. Tests may be done in the lab or at home. They include: Using a swab to take a sample of fluid from the back of your nose and throat (nasopharyngeal fluid), from your nose, or from your throat. Testing a sample of saliva from your mouth. Testing a sample of coughed-up mucus from your lungs (sputum). How is this treated? Treatment for COVID-19 infection depends on the severity of the condition. Mild symptoms can be managed at home with rest, fluids, and over-the-counter medicines. Serious symptoms may be treated in a hospital intensive care unit (ICU). Treatment in the ICU may include:  Supplemental oxygen. Extra oxygen is given through a tube in the nose, a face mask, or a hood. Medicines. These may include: Antivirals, such as monoclonal antibodies. These help your body fight off certain viruses that can cause disease. Anti-inflammatories, such as corticosteroids. These reduce inflammation and suppress the immune system. Antithrombotics. These prevent or treat blood clots, if they develop. Convalescent plasma. This helps boost your immune system, if you have an underlying immunosuppressive condition or are getting immunosuppressive treatments. Prone positioning. This means you will lie on your stomach. This helps oxygen to get into your lungs. Infection control measures. If you are at risk for more serious illness caused by COVID-19, your health care provider may prescribe two long-acting monoclonal antibodies, given together every 6 months. How is  this prevented? To protect yourself: Use preventive medicine (pre-exposure prophylaxis). You may get pre-exposure prophylaxis if you have moderate or severe immunocompromise. Get vaccinated. Anyone 68 months old or older who meets guidelines can get a COVID-19 vaccine or vaccine series. This includes people who are pregnant or making breast milk (lactating). Get an added dose of COVID-19 vaccine after your first vaccine or vaccine series if you have moderate to severe immunocompromise. This applies if you have had a solid organ transplant or have been diagnosed with an immunocompromising condition. You should get the added dose 4 weeks after you got the first COVID-19 vaccine or vaccine series. If you get an mRNA vaccine, you will need a 3-dose primary series. If you get the J&J/Janssen vaccine, you will need a 2-dose primary series, with the second dose being an mRNA vaccine. Talk to your health care provider about getting experimental monoclonal antibodies. This treatment is approved under emergency use authorization to prevent severe illness before or after being exposed to the COVID-19 virus. You may be given monoclonal antibodies if: You have moderate or severe immunocompromise. This includes treatments that lower your immune response. People with immunocompromise may not develop protection against COVID-19 when they are vaccinated. You cannot be vaccinated. You may not get a vaccine if you have a severe allergic reaction to the vaccine or its components. You are not fully vaccinated. You are in a facility where COVID-19 is present and: Are in close contact with a person who is infected with the COVID-19 virus. Are at high risk of being exposed to the COVID-19 virus. You are at risk of illness from new variants of the COVID-19 virus. To protect others: If you have symptoms of COVID-19, take steps to prevent the virus from spreading to others. Stay home. Leave your house only to get medical  care. Do not use public transit, if possible. Do not travel while you are sick. Wash your hands often with soap and water for at least 20 seconds. If soap and water are not available, use alcohol-based hand sanitizer. Make sure that all people in your household wash their hands well and often. Cough or sneeze into a tissue or your sleeve or elbow. Do not cough or sneeze into your hand or into the air. Where to find more information Centers for Disease Control and Prevention: CharmC[employer].be World Health Organization: https://www.castaneda.info/ Get help right away if: You have trouble breathing. You have pain or pressure in your chest. You are confused. You have bluish lips and fingernails. You have trouble waking from sleep. You have symptoms that get worse. These symptoms may be an emergency. Get help right away. Call 911. Do not wait to see if the symptoms will go away. Do  not drive yourself to the hospital. Summary COVID-19 is an infection that is caused by a new coronavirus. Sometimes, there are no symptoms. Other times, symptoms range from mild to severe. Some people with a severe COVID-19 infection develop severe disease. The virus that causes COVID-19 can spread from person to person through droplets or aerosols from breathing, speaking, singing, coughing, or sneezing. Mild symptoms of COVID-19 can be managed at home with rest, fluids, and over-the-counter medicines. This information is not intended to replace advice given to you by your health care provider. Make sure you discuss any questions you have with your health care provider. Document Revised: 03/20/2021 Document Reviewed: 03/22/2021 Elsevier Patient Education  [place].    If you have been instructed to have an in-person evaluation today at a local Urgent Care facility, please use the link below. It will take you to a list of all of our available [facility], including address,  phone number and hours of operation. Please do not delay care.  [facility]  If you or a family member do not have a primary care provider, use the link below to schedule a visit and establish care. When you choose a [facility] primary care physician or advanced practice provider, you gain a long-term partner in health. Find a Primary Care Provider  Learn more about [facility] in-office and virtual care options: [facility]

## 2022-03-01 NOTE — Telephone Encounter (Signed)
Patient states she has Cough, chest full of congestion. Low grade fever. Headache   Office is full.  Refuses visit at another office. Requesting to speak with Junious Dresser or Dr. [name].

## 2022-03-01 NOTE — Telephone Encounter (Signed)
Left message to return call to our office at their convenience.  Patient has VV with UC today at 9:30

## 2022-03-04 ENCOUNTER — Telehealth: Payer: Self-pay

## 2022-03-04 ENCOUNTER — Ambulatory Visit (HOSPITAL_COMMUNITY): Payer: Commercial Managed Care - PPO

## 2022-03-04 DIAGNOSIS — R161 Splenomegaly, not elsewhere classified: Secondary | ICD-10-CM

## 2022-03-04 NOTE — Telephone Encounter (Signed)
-----   Message from Levin Erp, [place] sent at 02/26/2022  2:24 PM EST ----- Regarding: FW: Splenomegaly Can you see if patient wants to follow-up with hematology in regards to her splenomegaly.  Thanks, JL L ----- Message ----- From: Irene Shipper, MD Sent: 02/26/2022   2:24 PM EST To: Levin Erp, PA Subject: RE: Splenomegaly                               Splenomegaly falls under the domain of hematology.  If she has not seen a hematologist, then she can be referred to be evaluated regarding splenomegaly.  If she has seen a hematologist, she can ask them questions ----- Message ----- From: Levin Erp, PA Sent: 02/26/2022   2:07 PM EST To: Irene Shipper, MD Subject: Splenomegaly                                   Patient asked many questions about her splenomegaly today.  Is there anything we need to do as far as further work-up.  Thanks, JL L

## 2022-03-04 NOTE — Telephone Encounter (Signed)
See if pt wants hematology referral.

## 2022-03-05 NOTE — Telephone Encounter (Signed)
Pt would like to have hematology referral. Referral placed in epic and pt know she will be contacted regarding appt.

## 2022-03-08 ENCOUNTER — Telehealth: Payer: Self-pay

## 2022-03-08 ENCOUNTER — Telehealth: Payer: Commercial Managed Care - PPO | Admitting: Physician Assistant

## 2022-03-08 DIAGNOSIS — U071 COVID-19: Secondary | ICD-10-CM

## 2022-03-08 DIAGNOSIS — B999 Unspecified infectious disease: Secondary | ICD-10-CM | POA: Diagnosis not present

## 2022-03-08 MED ORDER — MECLIZINE HCL 25 MG PO TABS: 25.0000 mg | ORAL_TABLET | Freq: Three times a day (TID) | ORAL | 0 refills | Status: AC | PRN

## 2022-03-08 NOTE — Progress Notes (Signed)
Virtual Visit Consent   Melinda Porter, you are scheduled for a virtual visit with a [facility] provider today. Just as with appointments in the office, your consent must be obtained to participate. Your consent will be active for this visit and any virtual visit you may have with one of our providers in the next 365 days. If you have a MyChart account, a copy of this consent can be sent to you electronically.  As this is a virtual visit, video technology does not allow for your provider to perform a traditional examination. This may limit your provider's ability to fully assess your condition. If your provider identifies any concerns that need to be evaluated in person or the need to arrange testing (such as labs, EKG, etc.), we will make arrangements to do so. Although advances in technology are sophisticated, we cannot ensure that it will always work on either your end or our end. If the connection with a video visit is poor, the visit may have to be switched to a telephone visit. With either a video or telephone visit, we are not always able to ensure that we have a secure connection.  By engaging in this virtual visit, you consent to the provision of healthcare and authorize for your insurance to be billed (if applicable) for the services provided during this visit. Depending on your insurance coverage, you may receive a charge related to this service.  I need to obtain your verbal consent now. Are you willing to proceed with your visit today? Melinda Porter has provided verbal consent on 03/08/2022 for a virtual visit (video or telephone). Mar Daring, PA-C  Date: 03/08/2022 11:45 AM  Virtual Visit via Video Note   I, Mar Daring, connected with  Melinda Porter  ([id number], 1970-06-26) on 03/08/22 at 10:30 AM EST by a video-enabled telemedicine application and verified that I am speaking with the correct person using two identifiers.  Location: Patient: Virtual Visit Location  Patient: Home Provider: Virtual Visit Location Provider: Home Office   I discussed the limitations of evaluation and management by telemedicine and the availability of in person appointments. The patient expressed understanding and agreed to proceed.    History of Present Illness: Melinda Porter is a 51 y.o. who identifies as a female who was assigned female at birth, and is being seen today for Covid 30.  HPI: URI  This is a new problem. Episode onset: Covid 19 tested positive over 8 days ago; did VV on 03/01/22; still testing positive. Now having sinus pain/pressure, ear fullness and vertigo. The problem has been gradually worsening. There has been no fever. Associated symptoms include congestion, coughing, ear pain, headaches, nausea, rhinorrhea and sinus pain. Pertinent negatives include no sore throat or wheezing. Associated symptoms comments: dizziness. Treatments tried: Copywriter, advertising and aleve. The treatment provided no relief.     Problems:  Patient Active Problem List   Diagnosis Date Noted   Sleep-disordered breathing 11/12/2021   Elevated lipase 10/23/2021   Prediabetes 01/01/2021   Morbid obesity ([place]) 07/10/2020   Depression, major, single episode, moderate ([place]) 05/29/2020   Anxiety 05/29/2020   PTSD (post-traumatic stress disorder) 05/29/2020   Vitamin D deficiency 05/29/2020   Iron deficiency anemia 05/29/2020   GERD (gastroesophageal reflux disease) 05/29/2020   Sjogren's syndrome ([place]) 05/29/2020   Seasonal allergies 05/29/2020   Vitamin B12 deficiency 05/29/2020   Cold sore 05/29/2020   Systemic lupus erythematosus ([place]) 12/29/2019   Ulcerative colitis ([place]) 01/05/2017  Persistent headaches 11/05/2014   Facial droop 11/05/2014   Syncope 11/05/2014   Hypertension 11/05/2014   Hyperlipemia 11/05/2014   Psoriatic arthritis ([place]) 11/05/2014    Allergies:  Allergies  Allergen Reactions   Lyrica [Pregabalin] Shortness Of Breath   Sulfa Antibiotics Rash and Other  (See Comments)   Ciprofloxacin Nausea Only   Gabapentin Other (See Comments)    Stroke like symptoms   Morphine Sulfate     Other reaction(s): Unknown   Nsaids Other (See Comments)    Liver problems- can't take tylenol, ibuprofen etc.   Prednisone Other (See Comments)    "I get deathly ill"   Remicade [Infliximab]    Tolmetin     Other reaction(s): Other (See Comments) Liver problems- can't take tylenol, ibuprofen etc.   Codeine Rash    Other reaction(s): Unknown   Humira [Adalimumab] Rash    Worsening of Lupus rash.   Morphine And Related Rash    "Wired up"   Penicillins Rash    Has patient had a PCN reaction causing immediate rash, facial/tongue/throat swelling, SOB or lightheadedness with hypotension: NO Has patient had a PCN reaction causing severe rash involving mucus membranes or skin necrosis: NO Has patient had a PCN reaction that required hospitalization NO Has patient had a PCN reaction occurring within the last 10 years: NO If all of the above answers are "NO", then may proceed with Cephalosporin use.   Medications:  Current Outpatient Medications:    meclizine (ANTIVERT) 25 MG tablet, Take 1 tablet (25 mg total) by mouth 3 (three) times daily as needed for dizziness., Disp: 30 tablet, Rfl: 0   abatacept (ORENCIA) 250 MG injection, , Disp: , Rfl:    Apremilast (OTEZLA) 10 & 20 & 30 MG TBPK, Take by mouth., Disp: , Rfl:    Ascorbic Acid (VITA-C PO), Take by mouth. 3 gummies a day, Disp: , Rfl:    cephALEXin (KEFLEX) 500 MG capsule, Take 1 capsule (500 mg total) by mouth 2 (two) times daily., Disp: 14 capsule, Rfl: 0   clonazePAM (KLONOPIN) 1 MG tablet, Take 1 tablet (1 mg total) by mouth 3 (three) times daily as needed for anxiety., Disp: 90 tablet, Rfl: 3   cromolyn (OPTICROM) 4 % ophthalmic solution, Place 1 drop into both eyes 4 (four) times daily., Disp: 10 mL, Rfl: 1   cyclobenzaprine (FLEXERIL) 10 MG tablet, Take 1-2 tablets (10-20 mg total) by mouth 3 (three)  times daily as needed for muscle spasms., Disp: 60 tablet, Rfl: 0   diphenoxylate-atropine (LOMOTIL) 2.5-0.025 MG tablet, Take 1 tablet by mouth 2 (two) times daily as needed for diarrhea or loose stools., Disp: 60 tablet, Rfl: 2   ferrous sulfate 324 (65 Fe) MG TBEC, Take 1 tablet (324 mg total) by mouth in the morning and at bedtime., Disp: 180 tablet, Rfl: 3   fluticasone (FLONASE) 50 MCG/ACT nasal spray, SPRAY 2 SPRAYS INTO EACH NOSTRIL EVERY DAY, Disp: 48 mL, Rfl: 2   glucose 4 GM chewable tablet, Chew 1 tablet (4 g total) by mouth as needed for low blood sugar., Disp: 50 tablet, Rfl: 1   halobetasol (ULTRAVATE) 0.05 % cream, Apply topically., Disp: , Rfl:    losartan (COZAAR) 100 MG tablet, TAKE 1 TABLET(100 MG) BY MOUTH DAILY, Disp: 30 tablet, Rfl: 5   mesalamine (LIALDA) 1.2 g EC tablet, Take 4 tablets (4.8 g total) by mouth daily., Disp: 360 tablet, Rfl: 3   ondansetron (ZOFRAN) 8 MG tablet, Take 1 tablet (8 mg  total) by mouth every 8 (eight) hours as needed for nausea or vomiting., Disp: 20 tablet, Rfl: 0   pantoprazole (PROTONIX) 40 MG tablet, TAKE 1 TABLET(40 MG) BY MOUTH TWICE DAILY, Disp: 180 tablet, Rfl: 1   Potassium 95 MG TABS, Take by mouth., Disp: , Rfl:    promethazine (PHENERGAN) 25 MG tablet, 1 tablet as needed, Disp: , Rfl:    sertraline (ZOLOFT) 100 MG tablet, Take 2 tablets (200 mg total) by mouth daily., Disp: 180 tablet, Rfl: 3   topiramate (TOPAMAX) 100 MG tablet, TAKE 1 TABLET(100 MG) BY MOUTH TWICE DAILY, Disp: 180 tablet, Rfl: 1   valACYclovir (VALTREX) 1000 MG tablet, Take 2 tablets (2,000 mg total) by mouth as needed. For two doses for cold sores., Disp: 20 tablet, Rfl: 0   Vitamin D, Ergocalciferol, (DRISDOL) 1.25 MG (50000 UNIT) CAPS capsule, Take 1 capsule (50,000 Units total) by mouth every 7 (seven) days., Disp: 12 capsule, Rfl: 3  Observations/Objective: Patient is well-developed, well-nourished in no acute distress.  Resting comfortably at home.  Head is  normocephalic, atraumatic.  No labored breathing.  Speech is clear and coherent with logical content.  Patient is alert and oriented at baseline.    Assessment and Plan: 1. COVID-19 - meclizine (ANTIVERT) 25 MG tablet; Take 1 tablet (25 mg total) by mouth 3 (three) times daily as needed for dizziness.  Dispense: 30 tablet; Refill: 0  2. Superimposed infection  - Suspect secondary sinus infection from Covid 19 - Meclizine for vertigo (drowsiness precautions discussed) - Patient has Keflex already at home and has been advised to begin this twice daily - Push fluids - Move around every 1-2 hours  - Seek in person evaluation if symptoms continue to worsen or fail to improve.  Follow Up Instructions: I discussed the assessment and treatment plan with the patient. The patient was provided an opportunity to ask questions and all were answered. The patient agreed with the plan and demonstrated an understanding of the instructions.  A copy of instructions were sent to the patient via MyChart unless otherwise noted below.    The patient was advised to call back or seek an in-person evaluation if the symptoms worsen or if the condition fails to improve as anticipated.  Time:  I spent 15 minutes with the patient via telehealth technology discussing the above problems/concerns.    Mar Daring, PA-C

## 2022-03-08 NOTE — Patient Instructions (Signed)
Farheen S [facility], thank you for joining Mar Daring, PA-C for today's virtual visit.  While this provider is not your primary care provider (PCP), if your PCP is located in our provider database this encounter information will be shared with them immediately following your visit.   [facility] account gives you access to today's visit and all your visits, tests, and labs performed at [facility] " click here if you don't have a [facility] account or go to [email or web address]  Consent: (Patient) Neziah S Jovel provided verbal consent for this virtual visit at the beginning of the encounter.  Current Medications:  Current Outpatient Medications:    meclizine (ANTIVERT) 25 MG tablet, Take 1 tablet (25 mg total) by mouth 3 (three) times daily as needed for dizziness., Disp: 30 tablet, Rfl: 0   abatacept (ORENCIA) 250 MG injection, , Disp: , Rfl:    Apremilast (OTEZLA) 10 & 20 & 30 MG TBPK, Take by mouth., Disp: , Rfl:    Ascorbic Acid (VITA-C PO), Take by mouth. 3 gummies a day, Disp: , Rfl:    cephALEXin (KEFLEX) 500 MG capsule, Take 1 capsule (500 mg total) by mouth 2 (two) times daily., Disp: 14 capsule, Rfl: 0   clonazePAM (KLONOPIN) 1 MG tablet, Take 1 tablet (1 mg total) by mouth 3 (three) times daily as needed for anxiety., Disp: 90 tablet, Rfl: 3   cromolyn (OPTICROM) 4 % ophthalmic solution, Place 1 drop into both eyes 4 (four) times daily., Disp: 10 mL, Rfl: 1   cyclobenzaprine (FLEXERIL) 10 MG tablet, Take 1-2 tablets (10-20 mg total) by mouth 3 (three) times daily as needed for muscle spasms., Disp: 60 tablet, Rfl: 0   diphenoxylate-atropine (LOMOTIL) 2.5-0.025 MG tablet, Take 1 tablet by mouth 2 (two) times daily as needed for diarrhea or loose stools., Disp: 60 tablet, Rfl: 2   ferrous sulfate 324 (65 Fe) MG TBEC, Take 1 tablet (324 mg total) by mouth in the morning and at bedtime., Disp: 180 tablet, Rfl: 3   fluticasone (FLONASE) 50 MCG/ACT  nasal spray, SPRAY 2 SPRAYS INTO EACH NOSTRIL EVERY DAY, Disp: 48 mL, Rfl: 2   glucose 4 GM chewable tablet, Chew 1 tablet (4 g total) by mouth as needed for low blood sugar., Disp: 50 tablet, Rfl: 1   halobetasol (ULTRAVATE) 0.05 % cream, Apply topically., Disp: , Rfl:    losartan (COZAAR) 100 MG tablet, TAKE 1 TABLET(100 MG) BY MOUTH DAILY, Disp: 30 tablet, Rfl: 5   mesalamine (LIALDA) 1.2 g EC tablet, Take 4 tablets (4.8 g total) by mouth daily., Disp: 360 tablet, Rfl: 3   ondansetron (ZOFRAN) 8 MG tablet, Take 1 tablet (8 mg total) by mouth every 8 (eight) hours as needed for nausea or vomiting., Disp: 20 tablet, Rfl: 0   pantoprazole (PROTONIX) 40 MG tablet, TAKE 1 TABLET(40 MG) BY MOUTH TWICE DAILY, Disp: 180 tablet, Rfl: 1   Potassium 95 MG TABS, Take by mouth., Disp: , Rfl:    promethazine (PHENERGAN) 25 MG tablet, 1 tablet as needed, Disp: , Rfl:    sertraline (ZOLOFT) 100 MG tablet, Take 2 tablets (200 mg total) by mouth daily., Disp: 180 tablet, Rfl: 3   topiramate (TOPAMAX) 100 MG tablet, TAKE 1 TABLET(100 MG) BY MOUTH TWICE DAILY, Disp: 180 tablet, Rfl: 1   valACYclovir (VALTREX) 1000 MG tablet, Take 2 tablets (2,000 mg total) by mouth as needed. For two doses for cold sores., Disp: 20 tablet, Rfl: 0  Vitamin D, Ergocalciferol, (DRISDOL) 1.25 MG (50000 UNIT) CAPS capsule, Take 1 capsule (50,000 Units total) by mouth every 7 (seven) days., Disp: 12 capsule, Rfl: 3   Medications ordered in this encounter:  Meds ordered this encounter  Medications   meclizine (ANTIVERT) 25 MG tablet    Sig: Take 1 tablet (25 mg total) by mouth 3 (three) times daily as needed for dizziness.    Dispense:  30 tablet    Refill:  0    Order Specific Question:   Supervising Provider    Answer:   Chase Picket [id number]     *If you need refills on other medications prior to your next appointment, please contact your pharmacy*  Follow-Up: Call back or seek an in-person evaluation if the symptoms  worsen or if the condition fails to improve as anticipated.  [place] [phone]  Other Instructions  Vertigo Vertigo is the feeling that you or the things around you are moving when they are not. This feeling can come and go at any time. Vertigo often goes away on its own. This condition can be dangerous if it happens when you are doing activities like driving or working with machines. Your doctor will do tests to find the cause of your vertigo. These tests will also help your doctor decide on the best treatment for you. Follow these instructions at home: Eating and drinking     Drink enough fluid to keep your pee (urine) pale yellow. Do not drink alcohol. Activity Return to your normal activities when your doctor says that it is safe. In the morning, first sit up on the side of the bed. When you feel okay, stand slowly while you hold onto something until you know that your balance is fine. Move slowly. Avoid sudden body or head movements or certain positions, as told by your doctor. Use a cane if you have trouble standing or walking. Sit down right away if you feel dizzy. Avoid doing any tasks or activities that can cause danger to you or others if you get dizzy. Avoid bending down if you feel dizzy. Place items in your home so that they are easy for you to reach without bending or leaning over. Do not drive or use machinery if you feel dizzy. General instructions Take over-the-counter and prescription medicines only as told by your doctor. Keep all follow-up visits. Contact a doctor if: Your medicine does not help your vertigo. Your problems get worse or you have new symptoms. You have a fever. You feel like you may vomit (nauseous), or this feeling gets worse. You start to vomit. Your family or friends see changes in how you act. You lose feeling (have numbness) in part of your body. You feel prickling and tingling in a part of your body. Get help right  away if: You are always dizzy. You faint. You get very bad headaches. You get a stiff neck. Bright light starts to bother you. You have trouble moving or talking. You feel weak in your hands, arms, or legs. You have changes in your hearing or in how you see (vision). These symptoms may be an emergency. Get help right away. Call your local emergency services (911 in the [place] Do not wait to see if the symptoms will go away. Do not drive yourself to the hospital. Summary Vertigo is the feeling that you or the things around you are moving when they are not. Your doctor will do tests to find the cause  of your vertigo. You may be told to avoid some tasks, positions, or movements. Contact a doctor if your medicine is not helping, or if you have a fever, new symptoms, or a change in how you act. Get help right away if you get very bad headaches, or if you have changes in how you speak, hear, or see. This information is not intended to replace advice given to you by your health care provider. Make sure you discuss any questions you have with your health care provider. Document Revised: 03/01/2020 Document Reviewed: 03/01/2020 Elsevier Patient Education  [place].   Sinus Infection, Adult A sinus infection is soreness and swelling (inflammation) of your sinuses. Sinuses are hollow spaces in the bones around your face. They are located: Around your eyes. In the middle of your forehead. Behind your nose. In your cheekbones. Your sinuses and nasal passages are lined with a fluid called mucus. Mucus drains out of your sinuses. Swelling can trap mucus in your sinuses. This lets germs (bacteria, virus, or fungus) grow, which leads to infection. Most of the time, this condition is caused by a virus. What are the causes? Allergies. Asthma. Germs. Things that block your nose or sinuses. Growths in the nose (nasal polyps). Chemicals or irritants in the air. A fungus. This is rare. What  increases the risk? Having a weak body defense system (immune system). Doing a lot of swimming or diving. Using nasal sprays too much. Smoking. What are the signs or symptoms? The main symptoms of this condition are pain and a feeling of pressure around the sinuses. Other symptoms include: Stuffy nose (congestion). This may make it hard to breathe through your nose. Runny nose (drainage). Soreness, swelling, and warmth in the sinuses. A cough that may get worse at night. Being unable to smell and taste. Mucus that collects in the throat or the back of the nose (postnasal drip). This may cause a sore throat or bad breath. Being very tired (fatigued). A fever. How is this diagnosed? Your symptoms. Your medical history. A physical exam. Tests to find out if your condition is short-term (acute) or long-term (chronic). Your doctor may: Check your nose for growths (polyps). Check your sinuses using a tool that has a light on one end (endoscope). Check for allergies or germs. Do imaging tests, such as an MRI or CT scan. How is this treated? Treatment for this condition depends on the cause and whether it is short-term or long-term. If caused by a virus, your symptoms should go away on their own within 10 days. You may be given medicines to relieve symptoms. They include: Medicines that shrink swollen tissue in the nose. A spray that treats swelling of the nostrils. Rinses that help get rid of thick mucus in your nose (nasal saline washes). Medicines that treat allergies (antihistamines). Over-the-counter pain relievers. If caused by bacteria, your doctor may wait to see if you will get better without treatment. You may be given antibiotic medicine if you have: A very bad infection. A weak body defense system. If caused by growths in the nose, surgery may be needed. Follow these instructions at home: Medicines Take, use, or apply over-the-counter and prescription medicines only as told  by your doctor. These may include nasal sprays. If you were prescribed an antibiotic medicine, take it as told by your doctor. Do not stop taking it even if you start to feel better. Hydrate and humidify  Drink enough water to keep your pee (urine) pale yellow.  Use a cool mist humidifier to keep the humidity level in your home above 50%. Breathe in steam for 10-15 minutes, 3-4 times a day, or as told by your doctor. You can do this in the bathroom while a hot shower is running. Try not to spend time in cool or dry air. Rest Rest as much as you can. Sleep with your head raised (elevated). Make sure you get enough sleep each night. General instructions  Put a warm, moist washcloth on your face 3-4 times a day, or as often as told by your doctor. Use nasal saline washes as often as told by your doctor. Wash your hands often with soap and water. If you cannot use soap and water, use hand sanitizer. Do not smoke. Avoid being around people who are smoking (secondhand smoke). Keep all follow-up visits. Contact a doctor if: You have a fever. Your symptoms get worse. Your symptoms do not get better within 10 days. Get help right away if: You have a very bad headache. You cannot stop vomiting. You have very bad pain or swelling around your face or eyes. You have trouble seeing. You feel confused. Your neck is stiff. You have trouble breathing. These symptoms may be an emergency. Get help right away. Call 911. Do not wait to see if the symptoms will go away. Do not drive yourself to the hospital. Summary A sinus infection is swelling of your sinuses. Sinuses are hollow spaces in the bones around your face. This condition is caused by tissues in your nose that become inflamed or swollen. This traps germs. These can lead to infection. If you were prescribed an antibiotic medicine, take it as told by your doctor. Do not stop taking it even if you start to feel better. Keep all follow-up  visits. This information is not intended to replace advice given to you by your health care provider. Make sure you discuss any questions you have with your health care provider. Document Revised: 03/06/2021 Document Reviewed: 03/06/2021 Elsevier Patient Education  [place].    If you have been instructed to have an in-person evaluation today at a local Urgent Care facility, please use the link below. It will take you to a list of all of our available [facility], including address, phone number and hours of operation. Please do not delay care.  [facility]  If you or a family member do not have a primary care provider, use the link below to schedule a visit and establish care. When you choose a [facility] primary care physician or advanced practice provider, you gain a long-term partner in health. Find a Primary Care Provider  Learn more about [facility]'s in-office and virtual care options: [facility]

## 2022-03-08 NOTE — Telephone Encounter (Signed)
BPA triggered for worsening symptoms weakness.   Patient called, left VM to return the call to speak to a nurse [phone]. If she returns the call, advise as below.   If patient has worsening weakness with inability to stand or if patient has to hold on to something to get balance, advise patient to call 911 and seek treatment in ED

## 2022-03-12 ENCOUNTER — Telehealth: Payer: Self-pay | Admitting: Oncology

## 2022-03-12 ENCOUNTER — Ambulatory Visit: Payer: Commercial Managed Care - PPO | Admitting: Internal Medicine

## 2022-03-12 NOTE — Telephone Encounter (Signed)
Scheduled appointment per referral. Patient is aware of appointment date and time. Patient is aware to arrive 15 mins prior to appointment time and to bring updated insurance cards. Patient is aware of location.

## 2022-03-19 ENCOUNTER — Telehealth: Payer: Self-pay | Admitting: Oncology

## 2022-03-19 NOTE — Telephone Encounter (Signed)
Spoke with patient confirming changed appointments

## 2022-03-26 ENCOUNTER — Other Ambulatory Visit: Payer: Self-pay

## 2022-03-26 ENCOUNTER — Ambulatory Visit (HOSPITAL_COMMUNITY)
Admission: RE | Admit: 2022-03-26 | Discharge: 2022-03-26 | Disposition: A | Discharge status: Home / Self Care | Payer: Commercial Managed Care - PPO | Source: Ambulatory Visit | Attending: Physician Assistant | Admitting: Physician Assistant

## 2022-03-26 DIAGNOSIS — R1011 Right upper quadrant pain: Secondary | ICD-10-CM | POA: Diagnosis not present

## 2022-03-26 DIAGNOSIS — K760 Fatty (change of) liver, not elsewhere classified: Secondary | ICD-10-CM | POA: Diagnosis present

## 2022-03-26 DIAGNOSIS — R161 Splenomegaly, not elsewhere classified: Secondary | ICD-10-CM | POA: Insufficient documentation

## 2022-03-29 ENCOUNTER — Other Ambulatory Visit: Payer: Commercial Managed Care - PPO

## 2022-03-29 ENCOUNTER — Encounter: Payer: Commercial Managed Care - PPO | Admitting: Oncology

## 2022-04-03 ENCOUNTER — Encounter: Payer: Self-pay | Admitting: Family Medicine

## 2022-04-05 ENCOUNTER — Inpatient Hospital Stay: Payer: Commercial Managed Care - PPO

## 2022-04-05 ENCOUNTER — Inpatient Hospital Stay: Payer: Commercial Managed Care - PPO | Attending: Oncology | Admitting: Oncology

## 2022-04-05 ENCOUNTER — Other Ambulatory Visit: Payer: Self-pay | Admitting: Family Medicine

## 2022-04-05 VITALS — BP 144/83 | HR 78 | Temp 97.8°F | Resp 16 | Wt 249.3 lb

## 2022-04-05 DIAGNOSIS — I10 Essential (primary) hypertension: Secondary | ICD-10-CM | POA: Diagnosis not present

## 2022-04-05 DIAGNOSIS — R682 Dry mouth, unspecified: Secondary | ICD-10-CM | POA: Diagnosis not present

## 2022-04-05 DIAGNOSIS — M7989 Other specified soft tissue disorders: Secondary | ICD-10-CM | POA: Diagnosis not present

## 2022-04-05 DIAGNOSIS — R110 Nausea: Secondary | ICD-10-CM | POA: Diagnosis not present

## 2022-04-05 DIAGNOSIS — R5383 Other fatigue: Secondary | ICD-10-CM | POA: Diagnosis not present

## 2022-04-05 DIAGNOSIS — M549 Dorsalgia, unspecified: Secondary | ICD-10-CM | POA: Diagnosis not present

## 2022-04-05 DIAGNOSIS — L4050 Arthropathic psoriasis, unspecified: Secondary | ICD-10-CM

## 2022-04-05 DIAGNOSIS — R599 Enlarged lymph nodes, unspecified: Secondary | ICD-10-CM | POA: Diagnosis not present

## 2022-04-05 DIAGNOSIS — Z882 Allergy status to sulfonamides status: Secondary | ICD-10-CM

## 2022-04-05 DIAGNOSIS — Z8719 Personal history of other diseases of the digestive system: Secondary | ICD-10-CM | POA: Diagnosis not present

## 2022-04-05 DIAGNOSIS — M791 Myalgia, unspecified site: Secondary | ICD-10-CM | POA: Insufficient documentation

## 2022-04-05 DIAGNOSIS — D696 Thrombocytopenia, unspecified: Secondary | ICD-10-CM

## 2022-04-05 DIAGNOSIS — Z880 Allergy status to penicillin: Secondary | ICD-10-CM

## 2022-04-05 DIAGNOSIS — Z833 Family history of diabetes mellitus: Secondary | ICD-10-CM

## 2022-04-05 DIAGNOSIS — E611 Iron deficiency: Secondary | ICD-10-CM | POA: Diagnosis not present

## 2022-04-05 DIAGNOSIS — R197 Diarrhea, unspecified: Secondary | ICD-10-CM

## 2022-04-05 DIAGNOSIS — Z886 Allergy status to analgesic agent status: Secondary | ICD-10-CM | POA: Insufficient documentation

## 2022-04-05 DIAGNOSIS — K760 Fatty (change of) liver, not elsewhere classified: Secondary | ICD-10-CM

## 2022-04-05 DIAGNOSIS — E538 Deficiency of other specified B group vitamins: Secondary | ICD-10-CM

## 2022-04-05 DIAGNOSIS — M3500 Sicca syndrome, unspecified: Secondary | ICD-10-CM | POA: Insufficient documentation

## 2022-04-05 DIAGNOSIS — Z888 Allergy status to other drugs, medicaments and biological substances status: Secondary | ICD-10-CM | POA: Insufficient documentation

## 2022-04-05 DIAGNOSIS — R161 Splenomegaly, not elsewhere classified: Secondary | ICD-10-CM

## 2022-04-05 DIAGNOSIS — R21 Rash and other nonspecific skin eruption: Secondary | ICD-10-CM | POA: Insufficient documentation

## 2022-04-05 DIAGNOSIS — F32A Depression, unspecified: Secondary | ICD-10-CM | POA: Diagnosis not present

## 2022-04-05 DIAGNOSIS — Z800 Family history of malignant neoplasm of digestive organs: Secondary | ICD-10-CM | POA: Insufficient documentation

## 2022-04-05 DIAGNOSIS — Z90721 Acquired absence of ovaries, unilateral: Secondary | ICD-10-CM

## 2022-04-05 DIAGNOSIS — K219 Gastro-esophageal reflux disease without esophagitis: Secondary | ICD-10-CM | POA: Diagnosis not present

## 2022-04-05 DIAGNOSIS — Z881 Allergy status to other antibiotic agents status: Secondary | ICD-10-CM

## 2022-04-05 DIAGNOSIS — J3489 Other specified disorders of nose and nasal sinuses: Secondary | ICD-10-CM

## 2022-04-05 DIAGNOSIS — Z79899 Other long term (current) drug therapy: Secondary | ICD-10-CM | POA: Insufficient documentation

## 2022-04-05 DIAGNOSIS — Z87891 Personal history of nicotine dependence: Secondary | ICD-10-CM | POA: Diagnosis not present

## 2022-04-05 DIAGNOSIS — M3219 Other organ or system involvement in systemic lupus erythematosus: Secondary | ICD-10-CM

## 2022-04-05 DIAGNOSIS — Z8249 Family history of ischemic heart disease and other diseases of the circulatory system: Secondary | ICD-10-CM | POA: Insufficient documentation

## 2022-04-05 DIAGNOSIS — D509 Iron deficiency anemia, unspecified: Secondary | ICD-10-CM

## 2022-04-05 DIAGNOSIS — Z8349 Family history of other endocrine, nutritional and metabolic diseases: Secondary | ICD-10-CM | POA: Insufficient documentation

## 2022-04-05 DIAGNOSIS — Z885 Allergy status to narcotic agent status: Secondary | ICD-10-CM

## 2022-04-05 DIAGNOSIS — Z801 Family history of malignant neoplasm of trachea, bronchus and lung: Secondary | ICD-10-CM

## 2022-04-05 DIAGNOSIS — M797 Fibromyalgia: Secondary | ICD-10-CM | POA: Diagnosis not present

## 2022-04-05 DIAGNOSIS — Z83719 Family history of colon polyps, unspecified: Secondary | ICD-10-CM | POA: Insufficient documentation

## 2022-04-05 DIAGNOSIS — Z811 Family history of alcohol abuse and dependence: Secondary | ICD-10-CM | POA: Insufficient documentation

## 2022-04-05 LAB — CBC WITH DIFFERENTIAL (CANCER CENTER ONLY)
Abs Immature Granulocytes: 0.04 10*3/uL (ref 0.00–0.07)
Basophils Absolute: 0.1 10*3/uL (ref 0.0–0.1)
Basophils Relative: 1 %
Eosinophils Absolute: 0.4 10*3/uL (ref 0.0–0.5)
Eosinophils Relative: 4 %
HCT: 39.9 % (ref 36.0–46.0)
Hemoglobin: 12.9 g/dL (ref 12.0–15.0)
Immature Granulocytes: 0 %
Lymphocytes Relative: 18 %
Lymphs Abs: 1.7 10*3/uL (ref 0.7–4.0)
MCH: 25.5 pg — ABNORMAL LOW (ref 26.0–34.0)
MCHC: 32.3 g/dL (ref 30.0–36.0)
MCV: 78.9 fL — ABNORMAL LOW (ref 80.0–100.0)
Monocytes Absolute: 0.6 10*3/uL (ref 0.1–1.0)
Monocytes Relative: 6 %
Neutro Abs: 6.9 10*3/uL (ref 1.7–7.7)
Neutrophils Relative %: 71 %
Platelet Count: 125 10*3/uL — ABNORMAL LOW (ref 150–400)
RBC: 5.06 MIL/uL (ref 3.87–5.11)
RDW: 17.2 % — ABNORMAL HIGH (ref 11.5–15.5)
WBC Count: 9.8 10*3/uL (ref 4.0–10.5)
nRBC: 0 % (ref 0.0–0.2)

## 2022-04-05 LAB — PROTIME-INR
INR: 1.1 (ref 0.8–1.2)
Prothrombin Time: 14.5 seconds (ref 11.4–15.2)

## 2022-04-05 LAB — TECHNOLOGIST SMEAR REVIEW: Plt Morphology: NORMAL

## 2022-04-05 LAB — FOLATE: Folate: 9.9 ng/mL (ref >5.9)

## 2022-04-05 LAB — HIV ANTIBODY (ROUTINE TESTING W REFLEX): HIV Screen 4th Generation wRfx: NONREACTIVE

## 2022-04-05 LAB — CMP (CANCER CENTER ONLY)
ALT: 15 U/L (ref 0–44)
AST: 14 U/L — ABNORMAL LOW (ref 15–41)
Albumin: 4.3 g/dL (ref 3.5–5.0)
Alkaline Phosphatase: 82 U/L (ref 38–126)
Anion gap: 7 (ref 5–15)
BUN: 21 mg/dL — ABNORMAL HIGH (ref 6–20)
CO2: 23 mmol/L (ref 22–32)
Calcium: 9.5 mg/dL (ref 8.9–10.3)
Chloride: 110 mmol/L (ref 98–111)
Creatinine: 0.69 mg/dL (ref 0.44–1.00)
GFR, Estimated: >60 mL/min (ref >60)
Glucose, Bld: 96 mg/dL (ref 70–99)
Potassium: 3.7 mmol/L (ref 3.5–5.1)
Sodium: 140 mmol/L (ref 135–145)
Total Bilirubin: 0.4 mg/dL (ref 0.3–1.2)
Total Protein: 7.1 g/dL (ref 6.5–8.1)

## 2022-04-05 LAB — RETICULOCYTES
Immature Retic Fract: 23.2 % — ABNORMAL HIGH (ref 2.3–15.9)
RBC.: 5.07 MIL/uL (ref 3.87–5.11)
Retic Count, Absolute: 73 10*3/uL (ref 19.0–186.0)
Retic Ct Pct: 1.4 % (ref 0.4–3.1)

## 2022-04-05 LAB — FIBRINOGEN: Fibrinogen: 453 mg/dL (ref 210–475)

## 2022-04-05 LAB — HEPATITIS PANEL, ACUTE
HCV Ab: NONREACTIVE
Hep A IgM: NONREACTIVE
Hep B C IgM: NONREACTIVE
Hepatitis B Surface Ag: NONREACTIVE

## 2022-04-05 LAB — DIRECT ANTIGLOBULIN TEST (NOT AT ARMC)
DAT, IgG: NEGATIVE
DAT, complement: NEGATIVE

## 2022-04-05 LAB — FERRITIN: Ferritin: 87 ng/mL (ref 11–307)

## 2022-04-05 LAB — APTT: aPTT: 27 seconds (ref 24–36)

## 2022-04-05 LAB — VITAMIN B12: Vitamin B-12: 629 pg/mL (ref 180–914)

## 2022-04-05 NOTE — Progress Notes (Unsigned)
[facility] Initial Visit:  Patient Care Team: Vivi Barrack, MD as PCP - General (Family Medicine) End, Harrell Gave, MD as PCP - Cardiology (Cardiology) Barbee Cough, MD as Consulting Physician (Hematology)  CHIEF COMPLAINTS/PURPOSE OF CONSULTATION:  Oncology History   No history exists.    HISTORY OF PRESENTING ILLNESS: Melinda Porter 51 y.o. female is here because of thrombocytopenia Medical history is notable for arthritis, back pain, endometriosis, fatty liver, fibromyalgia, lupus, psoriatic arthritis sessile serrated colon polyps   October 15, 2021 CT abdomen pelvis with contrast.No focal hepatic lesion. Spleen measures 13.0 by 12.6 x 7.7 cm (volume = 660 cm^3). Minimal change from comparison exam. Lipase 269  October 23, 2021: IgG4 1  January 16, 2022: EGD  The esophagus was normal.  A few small inflammatory nodules in the antrum. The stomach was otherwise  normal.  Duodenum was normal. Colonoscopy  The terminal ileum appeared normal. Colon with patchy erythema throughout. Scarring in the region of the hepatic flexure,  again noted. Colon otherwise normal  February 04, 2022: WBC 9.9 hemoglobin 12.7 MCV 78 platelet count 130; 67 segs 22 lymphs 6 monos 4 eos 1 basophil  March 26, 2022: Right upper quadrant abdominal ultrasound.  Hepatic steatosis  April 05 2022:  [facility] Consult  No history of blood transfusion.  Skin rash on arms and legs.  Hard mobile nodules on arms have emerged over the last 2 months.    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  Tobacco none.  EtOH none  [facility] Mother alive 43 thyroid problems Father died 56's liver disease Sister alive 43 autoimmune disease    Review of Systems  Constitutional:  Positive for fatigue. Negative for appetite change, chills and fever.       Has gingival bleeding. Notes dried blood in AM when wakes up.  Bleeds when brushes teeth.  Last dental visit was "awhile"   Has dry mouth due to Sjogrens syndrome Weight fluctuates  HENT:   Positive for mouth sores and nosebleeds. Negative for hearing loss, lump/mass, sore throat, trouble swallowing and voice change.        Epistaxis not significant enough to cause her to go to ED  Eyes:  Negative for eye problems and icterus.       Vision changes:  None  Respiratory:  Negative for chest tightness, cough, hemoptysis and wheezing.        DOE:  Walking in a hurry  Cardiovascular:  Positive for leg swelling. Negative for palpitations.       Chest discomfort if cleaning the house  Gastrointestinal:  Positive for diarrhea and nausea. Negative for blood in stool, constipation and vomiting.       Periodic abdominal pain when has ulcerative colitis flare  Endocrine: Negative for hot flashes.       Cold intolerance:  none Heat intolerance:  none  Genitourinary:  Negative for bladder incontinence, difficulty urinating, dysuria, frequency, hematuria and nocturia.   Musculoskeletal:  Positive for arthralgias, back pain and myalgias.  Skin:  Positive for itching and rash. Negative for wound.       Rashes owing to psoriasis and lupus  Neurological:  Positive for dizziness, headaches and numbness. Negative for extremity weakness, light-headedness and speech difficulty.  Hematological:  Positive for adenopathy. Bruises/bleeds easily.  Psychiatric/Behavioral:  Negative for suicidal ideas. The patient is not nervous/anxious.        Does not sleep well at night  MEDICAL HISTORY: Past Medical History:  Diagnosis Date   Anemia    Anxiety    Arthritis    Back pain    Bilateral swelling of feet    Depression    Endometriosis    Fatty liver    Fibromyalgia    Fibromyalgia    GERD (gastroesophageal reflux disease)    Hiatal hernia    Hyperlipemia    Hypertension    IBS (irritable bowel syndrome)    Joint pain    Lupus (HCC)    Psoriatic arthritis (HCC)    PTSD (post-traumatic stress disorder)    Sjogren's disease  ([place])    Vitamin D deficiency     SURGICAL HISTORY: Past Surgical History:  Procedure Laterality Date   CESAREAN SECTION  1997   FLEXIBLE SIGMOIDOSCOPY N/A 01/06/2017   Procedure: FLEXIBLE SIGMOIDOSCOPY;  Surgeon: Yetta Flock, MD;  Location: [facility];  Service: Gastroenterology;  Laterality: N/A;   OOPHORECTOMY Bilateral    PARTIAL HYSTERECTOMY  07/2009   TEMPOROMANDIBULAR JOINT SURGERY  2009   right side   TONSILLECTOMY  2009    SOCIAL HISTORY: Social History   Socioeconomic History   Marital status: Widowed    Spouse name: Not on file   Number of children: 1   Years of education: Not on file   Highest education level: Not on file  Occupational History   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   Social Determinants of Health   Financial Resource Strain: Not on file  Food Insecurity: Not on file  Transportation Needs: Not on file  Physical Activity: Not on file  Stress: Not on file  Social Connections: Not on file  Intimate Partner Violence: Not on file    FAMILY HISTORY Family History  Problem Relation Age of Onset   Colon polyps Maternal Grandmother    Lung cancer Maternal Grandmother    Liver disease Father        agent orange   Alcoholism Father    Thyroid disease Mother    Diabetes Maternal Grandfather    Heart disease Maternal Grandfather    Hypertension Maternal Grandfather    Colon cancer Maternal Uncle        great uncle   Breast cancer Neg Hx    Esophageal cancer Neg Hx    Stomach cancer  Neg Hx    Rectal cancer Neg Hx     ALLERGIES:  is allergic to lyrica [pregabalin], sulfa antibiotics, ciprofloxacin, gabapentin, morphine sulfate, nsaids, prednisone, remicade [infliximab], tolmetin, codeine, humira [adalimumab], morphine and related, and penicillins.  MEDICATIONS:  Current Outpatient Medications  Medication Sig Dispense Refill   abatacept (ORENCIA) 250 MG injection      Apremilast (OTEZLA) 10 & 20 & 30 MG TBPK Take by mouth.     Ascorbic Acid (VITA-C PO) Take by mouth. 3 gummies a day     cephALEXin (KEFLEX) 500 MG capsule Take 1 capsule (500 mg total) by mouth 2 (  two) times daily. 14 capsule 0   clonazePAM (KLONOPIN) 1 MG tablet Take 1 tablet (1 mg total) by mouth 3 (three) times daily as needed for anxiety. 90 tablet 3   cromolyn (OPTICROM) 4 % ophthalmic solution Place 1 drop into both eyes 4 (four) times daily. 10 mL 1   cyclobenzaprine (FLEXERIL) 10 MG tablet Take 1-2 tablets (10-20 mg total) by mouth 3 (three) times daily as needed for muscle spasms. 60 tablet 0   diphenoxylate-atropine (LOMOTIL) 2.5-0.025 MG tablet Take 1 tablet by mouth 2 (two) times daily as needed for diarrhea or loose stools. 60 tablet 2   ferrous sulfate 324 (65 Fe) MG TBEC Take 1 tablet (324 mg total) by mouth in the morning and at bedtime. 180 tablet 3   fluticasone (FLONASE) 50 MCG/ACT nasal spray SPRAY 2 SPRAYS INTO EACH NOSTRIL EVERY DAY 48 mL 2   glucose 4 GM chewable tablet Chew 1 tablet (4 g total) by mouth as needed for low blood sugar. 50 tablet 1   halobetasol (ULTRAVATE) 0.05 % cream Apply topically.     losartan (COZAAR) 100 MG tablet TAKE 1 TABLET(100 MG) BY MOUTH DAILY 30 tablet 5   meclizine (ANTIVERT) 25 MG tablet Take 1 tablet (25 mg total) by mouth 3 (three) times daily as needed for dizziness. 30 tablet 0   mesalamine (LIALDA) 1.2 g EC tablet Take 4 tablets (4.8 g total) by mouth daily. 360 tablet 3   ondansetron (ZOFRAN) 8 MG tablet Take 1 tablet (8 mg total) by mouth every 8  (eight) hours as needed for nausea or vomiting. 20 tablet 0   pantoprazole (PROTONIX) 40 MG tablet TAKE 1 TABLET(40 MG) BY MOUTH TWICE DAILY 180 tablet 1   Potassium 95 MG TABS Take by mouth.     promethazine (PHENERGAN) 25 MG tablet 1 tablet as needed     sertraline (ZOLOFT) 100 MG tablet Take 2 tablets (200 mg total) by mouth daily. 180 tablet 3   topiramate (TOPAMAX) 100 MG tablet TAKE 1 TABLET(100 MG) BY MOUTH TWICE DAILY 180 tablet 1   valACYclovir (VALTREX) 1000 MG tablet Take 2 tablets (2,000 mg total) by mouth as needed. For two doses for cold sores. 20 tablet 0   Vitamin D, Ergocalciferol, (DRISDOL) 1.25 MG (50000 UNIT) CAPS capsule Take 1 capsule (50,000 Units total) by mouth every 7 (seven) days. 12 capsule 3   zinc gluconate 50 MG tablet Take 50 mg by mouth daily.     No current facility-administered medications for this visit.    PHYSICAL EXAMINATION:  ECOG PERFORMANCE STATUS: 1 - Symptomatic but completely ambulatory   Vitals:   04/05/22 1111  BP: (!) 144/83  Pulse: 78  Resp: 16  Temp: 97.8 F (36.6 C)  SpO2: 100%    Filed Weights   04/05/22 1111  Weight: 249 lb 4.8 oz (113.1 kg)     Physical Exam Vitals and nursing note reviewed.  Constitutional:      General: She is not in acute distress.    Appearance: Normal appearance. She is obese. She is not ill-appearing, toxic-appearing or diaphoretic.     Comments: Here alone  HENT:     Head: Normocephalic and atraumatic.     Right Ear: External ear normal.     Left Ear: External ear normal.     Nose: Nose normal. No congestion or rhinorrhea.  Eyes:     General: No scleral icterus.    Extraocular Movements: Extraocular movements intact.  Conjunctiva/sclera: Conjunctivae normal.     Pupils: Pupils are equal, round, and reactive to light.  Cardiovascular:     Rate and Rhythm: Normal rate and regular rhythm.     Heart sounds: Normal heart sounds. No murmur heard.    No friction rub. No gallop.  Pulmonary:      Effort: Pulmonary effort is normal. No respiratory distress.     Breath sounds: Normal breath sounds. No stridor. No wheezing or rhonchi.  Abdominal:     General: Bowel sounds are normal.     Palpations: Abdomen is soft.     Tenderness: There is no abdominal tenderness. There is no guarding or rebound.  Musculoskeletal:        General: No swelling, tenderness or deformity.     Cervical back: Normal range of motion and neck supple. No rigidity or tenderness.  Lymphadenopathy:     Head:     Right side of head: No submental, submandibular, tonsillar, preauricular, posterior auricular or occipital adenopathy.     Left side of head: No submental, submandibular, tonsillar, preauricular, posterior auricular or occipital adenopathy.     Cervical: No cervical adenopathy.     Right cervical: No superficial, deep or posterior cervical adenopathy.    Left cervical: No superficial, deep or posterior cervical adenopathy.     Upper Body:     Right upper body: No supraclavicular, axillary, pectoral or epitrochlear adenopathy.     Left upper body: No supraclavicular, axillary, pectoral or epitrochlear adenopathy.  Skin:    General: Skin is warm.     Coloration: Skin is not jaundiced.     Comments: Follicular rash on extensor surfaces of arms. Firm subcutaneous, mobile nodules on dorsum of forearms   Neurological:     General: No focal deficit present.     Mental Status: She is alert and oriented to person, place, and time.     Cranial Nerves: No cranial nerve deficit.  Psychiatric:        Mood and Affect: Mood normal.        Behavior: Behavior normal.        Thought Content: Thought content normal.        Judgment: Judgment normal.     LABORATORY DATA: I have personally reviewed the data as listed:  Appointment on 04/05/2022  Component Date Value Ref Range Status   WBC Count 04/05/2022 9.8  4.0 - 10.5 K/uL Final   RBC 04/05/2022 5.06  3.87 - 5.11 MIL/uL Final   Hemoglobin 04/05/2022  12.9  12.0 - 15.0 g/dL Final   HCT 04/05/2022 39.9  36.0 - 46.0 % Final   MCV 04/05/2022 78.9 (L)  80.0 - 100.0 fL Final   MCH 04/05/2022 25.5 (L)  26.0 - 34.0 pg Final   MCHC 04/05/2022 32.3  30.0 - 36.0 g/dL Final   RDW 04/05/2022 17.2 (H)  11.5 - 15.5 % Final   Platelet Count 04/05/2022 125 (L)  150 - 400 K/uL Final   PLATELET COUNT CONFIRMED BY SMEAR   nRBC 04/05/2022 0.0  0.0 - 0.2 % Final   Neutrophils Relative % 04/05/2022 71  % Final   Neutro Abs 04/05/2022 6.9  1.7 - 7.7 K/uL Final   Lymphocytes Relative 04/05/2022 18  % Final   Lymphs Abs 04/05/2022 1.7  0.7 - 4.0 K/uL Final   Monocytes Relative 04/05/2022 6  % Final   Monocytes Absolute 04/05/2022 0.6  0.1 - 1.0 K/uL Final   Eosinophils Relative 04/05/2022 4  %  Final   Eosinophils Absolute 04/05/2022 0.4  0.0 - 0.5 K/uL Final   Basophils Relative 04/05/2022 1  % Final   Basophils Absolute 04/05/2022 0.1  0.0 - 0.1 K/uL Final   Immature Granulocytes 04/05/2022 0  % Final   Abs Immature Granulocytes 04/05/2022 0.04  0.00 - 0.07 K/uL Final   Performed at [facility], [place] [address] [place], [place] [address]   Sodium 04/05/2022 140  135 - 145 mmol/L Final   Potassium 04/05/2022 3.7  3.5 - 5.1 mmol/L Final   Chloride 04/05/2022 110  98 - 111 mmol/L Final   CO2 04/05/2022 23  22 - 32 mmol/L Final   Glucose, Bld 04/05/2022 96  70 - 99 mg/dL Final   Glucose reference range applies only to samples taken after fasting for at least 8 hours.   BUN 04/05/2022 21 (H)  6 - 20 mg/dL Final   Creatinine 04/05/2022 0.69  0.44 - 1.00 mg/dL Final   Calcium 04/05/2022 9.5  8.9 - 10.3 mg/dL Final   Total Protein 04/05/2022 7.1  6.5 - 8.1 g/dL Final   Albumin 04/05/2022 4.3  3.5 - 5.0 g/dL Final   AST 04/05/2022 14 (L)  15 - 41 U/L Final   ALT 04/05/2022 15  0 - 44 U/L Final   Alkaline Phosphatase 04/05/2022 82  38 - 126 U/L Final   Total Bilirubin 04/05/2022 0.4  0.3 - 1.2 mg/dL Final   GFR, Estimated 04/05/2022  >60  >60 mL/min Final   Comment: (NOTE) Calculated using the CKD-EPI Creatinine Equation (2021)    Anion gap 04/05/2022 7  5 - 15 Final   Performed at [facility], [place] [address], [place], [place] [address]   Retic Ct Pct 04/05/2022 1.4  0.4 - 3.1 % Final   RBC. 04/05/2022 5.07  3.87 - 5.11 MIL/uL Final   Retic Count, Absolute 04/05/2022 73.0  19.0 - 186.0 K/uL Final   Immature Retic Fract 04/05/2022 23.2 (H)  2.3 - 15.9 % Final   Performed at [facility], [place] [address], [place], [place] [address]   aPTT 04/05/2022 27  24 - 36 seconds Final   Performed at [facility], [place] [address], [place], [place] [address]   Prothrombin Time 04/05/2022 14.5  11.4 - 15.2 seconds Final   INR 04/05/2022 1.1  0.8 - 1.2 Final   Comment: (NOTE) INR goal varies based on device and disease states. Performed at [facility], [place] [address], [place], [place] [address]    WBC MORPHOLOGY 04/05/2022 MORPHOLOGY UNREMARKABLE   Final   RBC MORPHOLOGY 04/05/2022 OVALOCYTES   Final   Plt Morphology 04/05/2022 Normal platelet morphology   Final   Clinical Information 04/05/2022 thrombocytopenia   Final   Performed at [facility], [place] [address], [place], [place] [address]   Fibrinogen 04/05/2022 453  210 - 475 mg/dL Final   Comment: (NOTE) Fibrinogen results may be underestimated in patients receiving thrombolytic therapy. Performed at [facility], [place] [address] [place], [place] [address]     RADIOGRAPHIC STUDIES: I have personally reviewed the radiological images as listed and agree with the findings in the report  No results found.  ASSESSMENT/PLAN  51 y.o. female is here because of thrombocytopenia Medical history is notable for inflammatory arthritis (lupus, psoriatic arthritis),  back pain, endometriosis, fatty liver, fibromyalgia,  sessile serrated colon  polyps  Thrombocytopenia:  Mild and  without bleeding complications.  Most likely causes in this patient are autoimmune syndromes, inflammation, splenomegaly, steatohepatitis Obtain CBC with differential, CMP, B12, folate, reticulocyte count myeloma panel smear for review antiphospholipid antibody studies, HIV, hepatitis panel, SPEP with IEP and free light chains  Iron deficiency:  Likely secondary to chronic GI blood loss.  Will discuss endoscopy in the future  Skin nodules: Likely related to autoimmune disease.  Obtain ultrasound to evaluate   Cancer Staging  No matching staging information was found for the patient.   No problem-specific Assessment & Plan notes found for this encounter.   Orders Placed This Encounter  Procedures   [place] RT UPPER EXTREM LTD SOFT TISSUE NON VASCULAR    Standing Status:   Future    Standing Expiration Date:   04/06/2023    Order Specific Question:   Reason for Exam (SYMPTOM  OR DIAGNOSIS REQUIRED)    Answer:   firm skin nodules    Order Specific Question:   Preferred imaging location?    Answer:   [facility]    Order Specific Question:   Release to patient    Answer:   Immediate   CBC with Differential (Cancer Center Only)    Standing Status:   Future    Number of Occurrences:   1    Standing Expiration Date:   04/06/2023   CMP ([facility] only)    Standing Status:   Future    Number of Occurrences:   1    Standing Expiration Date:   04/06/2023   Cold agglutinin titer    Standing Status:   Future    Number of Occurrences:   1    Standing Expiration Date:   04/06/2023   Ferritin    Standing Status:   Future    Number of Occurrences:   1    Standing Expiration Date:   04/06/2023   Folate    Standing Status:   Future    Number of Occurrences:   1    Standing Expiration Date:   04/06/2023   Haptoglobin    Standing Status:   Future    Number of Occurrences:   1    Standing Expiration Date:   04/06/2023   Vitamin B12    Standing  Status:   Future    Number of Occurrences:   1    Standing Expiration Date:   04/06/2023   Reticulocytes    Standing Status:   Future    Number of Occurrences:   1    Standing Expiration Date:   04/06/2023   Kappa/lambda light chains    Standing Status:   Future    Number of Occurrences:   1    Standing Expiration Date:   04/06/2023   Multiple Myeloma Panel (SPEP&IFE w/QIG)    Standing Status:   Future    Number of Occurrences:   1    Standing Expiration Date:   04/06/2023   Hepatitis panel, acute    Standing Status:   Future    Number of Occurrences:   1    Standing Expiration Date:   04/06/2023   HIV Antibody (routine testing w rflx)    Standing Status:   Future    Number of Occurrences:   1    Standing Expiration Date:   04/06/2023   Beta-2-glycoprotein i abs, IgG/M/A    Standing Status:   Future    Number of Occurrences:   1    Standing Expiration  Date:   04/06/2023   Cardiolipin antibodies, IgM+IgG    Standing Status:   Future    Number of Occurrences:   1    Standing Expiration Date:   04/06/2023   APTT    Standing Status:   Future    Number of Occurrences:   1    Standing Expiration Date:   04/06/2023   Protime-INR    Standing Status:   Future    Number of Occurrences:   1    Standing Expiration Date:   04/06/2023   Technologist smear review    Standing Status:   Future    Number of Occurrences:   1    Standing Expiration Date:   04/06/2023    Order Specific Question:   Clinical information:    Answer:   thrombocytopenia   Fibrinogen    Standing Status:   Future    Number of Occurrences:   1    Standing Expiration Date:   04/06/2023   Direct antiglobulin test (not at [facility])    Standing Status:   Future    Number of Occurrences:   1    Standing Expiration Date:   04/06/2023    61  minutes was spent in patient care.  This included time spent preparing to see the patient (e.g., review of tests), obtaining and/or reviewing separately obtained history,  counseling and educating the patient, ordering medications, tests/ procedures; documenting clinical information in the electronic or other health record, independently interpreting results and communicating results to the patient as well as coordination of care.        All questions were answered. The patient knows to call the clinic with any problems, questions or concerns.  This note was electronically signed.    Barbee Cough, MD  04/05/2022 1:17 PM

## 2022-04-07 LAB — BETA-2-GLYCOPROTEIN I ABS, IGG/M/A
Beta-2 Glyco I IgG: <9 GPI IgG units (ref 0–20)
Beta-2-Glycoprotein I IgA: <9 GPI IgA units (ref 0–25)
Beta-2-Glycoprotein I IgM: <9 GPI IgM units (ref 0–32)

## 2022-04-07 LAB — CARDIOLIPIN ANTIBODIES, IGM+IGG
Anticardiolipin IgG: <9 GPL U/mL (ref 0–14)
Anticardiolipin IgM: <9 MPL U/mL (ref 0–12)

## 2022-04-07 LAB — HAPTOGLOBIN: Haptoglobin: 199 mg/dL (ref 42–296)

## 2022-04-09 LAB — KAPPA/LAMBDA LIGHT CHAINS
Kappa free light chain: 17.2 mg/L (ref 3.3–19.4)
Kappa, lambda light chain ratio: 1.51 (ref 0.26–1.65)
Lambda free light chains: 11.4 mg/L (ref 5.7–26.3)

## 2022-04-09 LAB — COLD AGGLUTININ TITER: Cold Agglutinin Titer: NEGATIVE

## 2022-04-11 ENCOUNTER — Telehealth: Payer: Self-pay | Admitting: Oncology

## 2022-04-11 ENCOUNTER — Other Ambulatory Visit: Payer: Self-pay | Admitting: Internal Medicine

## 2022-04-11 NOTE — Telephone Encounter (Signed)
Patient aware of upcoming appointments

## 2022-04-12 LAB — MULTIPLE MYELOMA PANEL, SERUM
Albumin SerPl Elph-Mcnc: 3.8 g/dL (ref 2.9–4.4)
Albumin/Glob SerPl: 1.5 (ref 0.7–1.7)
Alpha 1: 0.3 g/dL (ref 0.0–0.4)
Alpha2 Glob SerPl Elph-Mcnc: 0.8 g/dL (ref 0.4–1.0)
B-Globulin SerPl Elph-Mcnc: 1 g/dL (ref 0.7–1.3)
Gamma Glob SerPl Elph-Mcnc: 0.6 g/dL (ref 0.4–1.8)
Globulin, Total: 2.7 g/dL (ref 2.2–3.9)
IgA: 87 mg/dL (ref 87–352)
IgG (Immunoglobin G), Serum: 668 mg/dL (ref 586–1602)
IgM (Immunoglobulin M), Srm: 113 mg/dL (ref 26–217)
Total Protein ELP: 6.5 g/dL (ref 6.0–8.5)

## 2022-04-18 ENCOUNTER — Telehealth: Payer: Self-pay | Admitting: Family Medicine

## 2022-04-18 DIAGNOSIS — F419 Anxiety disorder, unspecified: Secondary | ICD-10-CM

## 2022-04-18 NOTE — Telephone Encounter (Signed)
Patient states: - Insurance informed her that her medication needs to be sent as a 90 day supply. If not she will be pentalized and have to pay more   Patient requests:  - A refill of klonopin:    LAST APPOINTMENT DATE:  02/08/22  NEXT APPOINTMENT DATE: 05/10/22  MEDICATION: clonazePAM (KLONOPIN) 1 MG tablet   Is the patient out of medication? Yes  PHARMACY:  [id number] [place], [place] AT [place] [address] [place] [address] Phone: [phone]  Fax: [phone]

## 2022-04-19 ENCOUNTER — Ambulatory Visit (HOSPITAL_BASED_OUTPATIENT_CLINIC_OR_DEPARTMENT_OTHER)
Admission: RE | Admit: 2022-04-19 | Discharge: 2022-04-19 | Disposition: A | Discharge status: Home / Self Care | Payer: Commercial Managed Care - PPO | Source: Ambulatory Visit | Attending: Oncology | Admitting: Oncology

## 2022-04-19 ENCOUNTER — Inpatient Hospital Stay: Payer: Commercial Managed Care - PPO | Attending: Oncology | Admitting: Oncology

## 2022-04-19 ENCOUNTER — Inpatient Hospital Stay: Payer: Commercial Managed Care - PPO

## 2022-04-19 VITALS — BP 144/64 | HR 92 | Resp 14 | Wt 247.6 lb

## 2022-04-19 DIAGNOSIS — Z8349 Family history of other endocrine, nutritional and metabolic diseases: Secondary | ICD-10-CM | POA: Insufficient documentation

## 2022-04-19 DIAGNOSIS — M3219 Other organ or system involvement in systemic lupus erythematosus: Secondary | ICD-10-CM | POA: Insufficient documentation

## 2022-04-19 DIAGNOSIS — R161 Splenomegaly, not elsewhere classified: Secondary | ICD-10-CM | POA: Insufficient documentation

## 2022-04-19 DIAGNOSIS — M797 Fibromyalgia: Secondary | ICD-10-CM | POA: Insufficient documentation

## 2022-04-19 DIAGNOSIS — Z8249 Family history of ischemic heart disease and other diseases of the circulatory system: Secondary | ICD-10-CM | POA: Insufficient documentation

## 2022-04-19 DIAGNOSIS — K219 Gastro-esophageal reflux disease without esophagitis: Secondary | ICD-10-CM | POA: Insufficient documentation

## 2022-04-19 DIAGNOSIS — Z833 Family history of diabetes mellitus: Secondary | ICD-10-CM | POA: Insufficient documentation

## 2022-04-19 DIAGNOSIS — M549 Dorsalgia, unspecified: Secondary | ICD-10-CM | POA: Diagnosis not present

## 2022-04-19 DIAGNOSIS — R197 Diarrhea, unspecified: Secondary | ICD-10-CM | POA: Insufficient documentation

## 2022-04-19 DIAGNOSIS — R5383 Other fatigue: Secondary | ICD-10-CM | POA: Insufficient documentation

## 2022-04-19 DIAGNOSIS — L4050 Arthropathic psoriasis, unspecified: Secondary | ICD-10-CM | POA: Diagnosis not present

## 2022-04-19 DIAGNOSIS — R42 Dizziness and giddiness: Secondary | ICD-10-CM | POA: Diagnosis not present

## 2022-04-19 DIAGNOSIS — Z87891 Personal history of nicotine dependence: Secondary | ICD-10-CM | POA: Insufficient documentation

## 2022-04-19 DIAGNOSIS — R21 Rash and other nonspecific skin eruption: Secondary | ICD-10-CM | POA: Insufficient documentation

## 2022-04-19 DIAGNOSIS — Z882 Allergy status to sulfonamides status: Secondary | ICD-10-CM | POA: Insufficient documentation

## 2022-04-19 DIAGNOSIS — Z8719 Personal history of other diseases of the digestive system: Secondary | ICD-10-CM | POA: Insufficient documentation

## 2022-04-19 DIAGNOSIS — E611 Iron deficiency: Secondary | ICD-10-CM | POA: Diagnosis not present

## 2022-04-19 DIAGNOSIS — Z885 Allergy status to narcotic agent status: Secondary | ICD-10-CM | POA: Insufficient documentation

## 2022-04-19 DIAGNOSIS — Z79899 Other long term (current) drug therapy: Secondary | ICD-10-CM | POA: Diagnosis not present

## 2022-04-19 DIAGNOSIS — M3500 Sicca syndrome, unspecified: Secondary | ICD-10-CM | POA: Diagnosis not present

## 2022-04-19 DIAGNOSIS — Z811 Family history of alcohol abuse and dependence: Secondary | ICD-10-CM | POA: Insufficient documentation

## 2022-04-19 DIAGNOSIS — R200 Anesthesia of skin: Secondary | ICD-10-CM | POA: Insufficient documentation

## 2022-04-19 DIAGNOSIS — N809 Endometriosis, unspecified: Secondary | ICD-10-CM | POA: Diagnosis not present

## 2022-04-19 DIAGNOSIS — M791 Myalgia, unspecified site: Secondary | ICD-10-CM | POA: Diagnosis not present

## 2022-04-19 DIAGNOSIS — Z83719 Family history of colon polyps, unspecified: Secondary | ICD-10-CM | POA: Insufficient documentation

## 2022-04-19 DIAGNOSIS — M1990 Unspecified osteoarthritis, unspecified site: Secondary | ICD-10-CM | POA: Diagnosis not present

## 2022-04-19 DIAGNOSIS — F32A Depression, unspecified: Secondary | ICD-10-CM | POA: Diagnosis not present

## 2022-04-19 DIAGNOSIS — Z881 Allergy status to other antibiotic agents status: Secondary | ICD-10-CM | POA: Insufficient documentation

## 2022-04-19 DIAGNOSIS — D509 Iron deficiency anemia, unspecified: Secondary | ICD-10-CM

## 2022-04-19 DIAGNOSIS — R229 Localized swelling, mass and lump, unspecified: Secondary | ICD-10-CM

## 2022-04-19 DIAGNOSIS — K760 Fatty (change of) liver, not elsewhere classified: Secondary | ICD-10-CM | POA: Insufficient documentation

## 2022-04-19 DIAGNOSIS — M7989 Other specified soft tissue disorders: Secondary | ICD-10-CM | POA: Diagnosis not present

## 2022-04-19 DIAGNOSIS — R519 Headache, unspecified: Secondary | ICD-10-CM | POA: Diagnosis not present

## 2022-04-19 DIAGNOSIS — R110 Nausea: Secondary | ICD-10-CM | POA: Insufficient documentation

## 2022-04-19 DIAGNOSIS — Z800 Family history of malignant neoplasm of digestive organs: Secondary | ICD-10-CM | POA: Insufficient documentation

## 2022-04-19 DIAGNOSIS — D696 Thrombocytopenia, unspecified: Secondary | ICD-10-CM | POA: Diagnosis present

## 2022-04-19 DIAGNOSIS — I10 Essential (primary) hypertension: Secondary | ICD-10-CM | POA: Diagnosis not present

## 2022-04-19 DIAGNOSIS — Z801 Family history of malignant neoplasm of trachea, bronchus and lung: Secondary | ICD-10-CM | POA: Insufficient documentation

## 2022-04-19 DIAGNOSIS — Z888 Allergy status to other drugs, medicaments and biological substances status: Secondary | ICD-10-CM | POA: Insufficient documentation

## 2022-04-19 DIAGNOSIS — Z880 Allergy status to penicillin: Secondary | ICD-10-CM | POA: Insufficient documentation

## 2022-04-19 DIAGNOSIS — Z886 Allergy status to analgesic agent status: Secondary | ICD-10-CM | POA: Insufficient documentation

## 2022-04-19 DIAGNOSIS — Z90721 Acquired absence of ovaries, unilateral: Secondary | ICD-10-CM | POA: Insufficient documentation

## 2022-04-19 MED ORDER — CLONAZEPAM 1 MG PO TABS: 1.0000 mg | ORAL_TABLET | Freq: Three times a day (TID) | ORAL | 1 refills | Status: DC | PRN

## 2022-04-19 NOTE — Progress Notes (Unsigned)
[facility]  Visit:  Patient Care Team: Vivi Barrack, MD as PCP - General (Family Medicine) End, Harrell Gave, MD as PCP - Cardiology (Cardiology) Barbee Cough, MD as Consulting Physician (Hematology)  CHIEF COMPLAINTS/PURPOSE OF CONSULTATION:  HISTORY OF PRESENTING ILLNESS: Melinda Porter 52 y.o. female is here because of thrombocytopenia Medical history is notable for arthritis, back pain, endometriosis, fatty liver, fibromyalgia, lupus, psoriatic arthritis sessile serrated colon polyps   October 15, 2021 CT abdomen pelvis with contrast.No focal hepatic lesion. Spleen measures 13.0 by 12.6 x 7.7 cm (volume = 660 cm^3). Minimal change from comparison exam. Lipase 269  October 23, 2021: IgG4 1  January 16, 2022: EGD  The esophagus was normal.  A few small inflammatory nodules in the antrum. The stomach was otherwise  normal.  Duodenum was normal. Colonoscopy  The terminal ileum appeared normal. Colon with patchy erythema throughout. Scarring in the region of the hepatic flexure,  again noted. Colon otherwise normal  February 04, 2022: WBC 9.9 hemoglobin 12.7 MCV 78 platelet count 130; 67 segs 22 lymphs 6 monos 4 eos 1 basophil  March 26, 2022: Right upper quadrant abdominal ultrasound.  Hepatic steatosis  April 05 2022:  [facility] Hematology Consult  No history of blood transfusion.  Skin rash on arms and legs.  Hard mobile nodules on arms have emerged over the last 2 months.    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  Tobacco none.  EtOH none  [place] Mother alive 6 thyroid problems Father died 17's liver disease Sister alive 54 autoimmune disease  WBC 9.8 hemoglobin 12.9 MCV 79 count 125; 71 segs 18 lymphs 6 monos 4 eos 1 basophil morphology confirmed thrombocytopenia but also demonstrated ovalocytes Coombs test negative.  Cold agglutinin titer negative  Haptoglobin 199 SPEP with IEP showed no paraprotein serum free kappa  17.2 lambda 11.4 with a kappa lambda 1.51 IgG 668 IgA 87 IgM 113 INR 1.1 PTT 27 fibrinogen 453 Anticardiolipin antibody negative antibeta-2 glycoprotein negative Ferritin 87 folate 9.9 B12 629 Hepatitis ABC serologies negative HIV negative  CMP notable for AST of 14  April 19 2022:  Scheduled follow up for thrombocytopenia.  Reviewed results of labs with patient.  U/S of skin nodules performed earlier today.  Results of imaging not avaialbe Patient updated her [facility] Sister alive 53 and has ITP and Sjogren's syndrome.  PLT averages 85K.   Takes iron bid.  Had ice pica but not currently.     Review of Systems  Constitutional:  Positive for fatigue. Negative for appetite change, chills and fever.       Has gingival bleeding. Notes dried blood in AM when wakes up.  Bleeds when brushes teeth.  Last dental visit was "awhile"  Has dry mouth due to Sjogrens syndrome Weight fluctuates  HENT:   Positive for mouth sores and nosebleeds. Negative for hearing loss, lump/mass, sore throat, trouble swallowing and voice change.        Epistaxis not significant enough to cause her to go to ED  Eyes:  Negative for eye problems and icterus.       Vision changes:  None  Respiratory:  Negative for chest tightness, cough, hemoptysis and wheezing.        DOE:  Walking in a hurry  Cardiovascular:  Positive for leg swelling. Negative for palpitations.       Chest discomfort if cleaning the house  Gastrointestinal:  Positive for diarrhea and  nausea. Negative for blood in stool, constipation and vomiting.       Periodic abdominal pain when has ulcerative colitis flare  Endocrine: Negative for hot flashes.       Cold intolerance:  none Heat intolerance:  none  Genitourinary:  Negative for bladder incontinence, difficulty urinating, dysuria, frequency, hematuria and nocturia.   Musculoskeletal:  Positive for arthralgias, back pain and myalgias.  Skin:  Positive for itching and rash. Negative for wound.        Rashes owing to psoriasis and lupus  Neurological:  Positive for dizziness, headaches and numbness. Negative for extremity weakness, light-headedness and speech difficulty.  Hematological:  Positive for adenopathy. Bruises/bleeds easily.  Psychiatric/Behavioral:  Negative for suicidal ideas. The patient is not nervous/anxious.        Does not sleep well at night    MEDICAL HISTORY: Past Medical History:  Diagnosis Date   Anemia    Anxiety    Arthritis    Back pain    Bilateral swelling of feet    Depression    Endometriosis    Fatty liver    Fibromyalgia    Fibromyalgia    GERD (gastroesophageal reflux disease)    Hiatal hernia    Hyperlipemia    Hypertension    IBS (irritable bowel syndrome)    Joint pain    Lupus (HCC)    Psoriatic arthritis (HCC)    PTSD (post-traumatic stress disorder)    Sjogren's disease ([place])    Vitamin D deficiency     SURGICAL HISTORY: Past Surgical History:  Procedure Laterality Date   CESAREAN SECTION  1997   FLEXIBLE SIGMOIDOSCOPY N/A 01/06/2017   Procedure: FLEXIBLE SIGMOIDOSCOPY;  Surgeon: Yetta Flock, MD;  Location: [facility];  Service: Gastroenterology;  Laterality: N/A;   OOPHORECTOMY Bilateral    PARTIAL HYSTERECTOMY  07/2009   TEMPOROMANDIBULAR JOINT SURGERY  2009   right side   TONSILLECTOMY  2009    SOCIAL HISTORY: Social History   Socioeconomic History   Marital status: Widowed    Spouse name: Not on file   Number of children: 1   Years of education: Not on file   Highest education level: Not on file  Occupational History   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   Social Determinants of Health   Financial Resource Strain: Not on file  Food Insecurity: Not on file  Transportation Needs: Not on file  Physical Activity: Not on file  Stress: Not on file  Social Connections: Not on file  Intimate Partner Violence: Not on file    FAMILY HISTORY Family History  Problem Relation Age of Onset   Colon polyps Maternal Grandmother    Lung cancer Maternal Grandmother    Liver disease Father        agent orange   Alcoholism Father    Thyroid disease Mother    Diabetes Maternal Grandfather    Heart disease Maternal Grandfather  Hypertension Maternal Grandfather    Colon cancer Maternal Uncle        great uncle   Breast cancer Neg Hx    Esophageal cancer Neg Hx    Stomach cancer Neg Hx    Rectal cancer Neg Hx     ALLERGIES:  is allergic to lyrica [pregabalin], sulfa antibiotics, ciprofloxacin, gabapentin, morphine sulfate, nsaids, prednisone, remicade [infliximab], tolmetin, codeine, humira [adalimumab], morphine and related, and penicillins.  MEDICATIONS:  Current Outpatient Medications  Medication Sig Dispense Refill   abatacept (ORENCIA) 250 MG injection      Apremilast (OTEZLA) 10 & 20 & 30 MG TBPK Take by mouth.     Ascorbic Acid (VITA-C PO) Take by mouth. 3 gummies a day     cephALEXin (KEFLEX) 500 MG capsule Take 1 capsule (500 mg total) by mouth 2 (two) times daily. 14 capsule 0   clonazePAM (KLONOPIN) 1 MG tablet Take 1 tablet (1 mg total) by mouth 3 (three) times daily as needed for anxiety. 90 tablet 3   cromolyn (OPTICROM) 4 % ophthalmic solution Place 1 drop into both eyes 4 (four) times daily. 10 mL 1   cyclobenzaprine (FLEXERIL) 10 MG tablet Take 1-2 tablets (10-20 mg total) by mouth 3 (three) times daily as needed for muscle spasms. 60 tablet 0   diphenoxylate-atropine (LOMOTIL) 2.5-0.025 MG tablet Take 1 tablet by mouth 2  (two) times daily as needed for diarrhea or loose stools. 60 tablet 2   ferrous sulfate 324 (65 Fe) MG TBEC TAKE 1 TABLET(324 MG) BY MOUTH IN THE MORNING AND AT BEDTIME 60 tablet 3   fluticasone (FLONASE) 50 MCG/ACT nasal spray SHAKE LIQUID AND USE 2 SPRAYS IN EACH NOSTRIL EVERY DAY 48 g 0   glucose 4 GM chewable tablet Chew 1 tablet (4 g total) by mouth as needed for low blood sugar. 50 tablet 1   halobetasol (ULTRAVATE) 0.05 % cream Apply topically.     losartan (COZAAR) 100 MG tablet TAKE 1 TABLET(100 MG) BY MOUTH DAILY 30 tablet 5   meclizine (ANTIVERT) 25 MG tablet Take 1 tablet (25 mg total) by mouth 3 (three) times daily as needed for dizziness. 30 tablet 0   mesalamine (LIALDA) 1.2 g EC tablet Take 4 tablets (4.8 g total) by mouth daily. 360 tablet 3   ondansetron (ZOFRAN) 8 MG tablet Take 1 tablet (8 mg total) by mouth every 8 (eight) hours as needed for nausea or vomiting. 20 tablet 0   pantoprazole (PROTONIX) 40 MG tablet TAKE 1 TABLET(40 MG) BY MOUTH TWICE DAILY 180 tablet 1   Potassium 95 MG TABS Take by mouth.     promethazine (PHENERGAN) 25 MG tablet 1 tablet as needed     sertraline (ZOLOFT) 100 MG tablet Take 2 tablets (200 mg total) by mouth daily. 180 tablet 3   topiramate (TOPAMAX) 100 MG tablet TAKE 1 TABLET(100 MG) BY MOUTH TWICE DAILY 180 tablet 1   valACYclovir (VALTREX) 1000 MG tablet Take 2 tablets (2,000 mg total) by mouth as needed. For two doses for cold sores. 20 tablet 0   Vitamin D, Ergocalciferol, (DRISDOL) 1.25 MG (50000 UNIT) CAPS capsule Take 1 capsule (50,000 Units total) by mouth every 7 (seven) days. 12 capsule 3   zinc gluconate 50 MG tablet Take 50 mg by mouth daily.     No current facility-administered medications for this visit.    PHYSICAL EXAMINATION:  ECOG PERFORMANCE STATUS: 1 - Symptomatic but completely ambulatory   There were no vitals  filed for this visit.   There were no vitals filed for this visit.    Physical Exam Vitals and  nursing note reviewed.  Constitutional:      General: She is not in acute distress.    Appearance: Normal appearance. She is obese. She is not ill-appearing, toxic-appearing or diaphoretic.     Comments: Here alone  HENT:     Head: Normocephalic and atraumatic.     Right Ear: External ear normal.     Left Ear: External ear normal.     Nose: Nose normal. No congestion or rhinorrhea.  Eyes:     General: No scleral icterus.    Extraocular Movements: Extraocular movements intact.     Conjunctiva/sclera: Conjunctivae normal.     Pupils: Pupils are equal, round, and reactive to light.  Cardiovascular:     Rate and Rhythm: Normal rate and regular rhythm.     Heart sounds: Normal heart sounds. No murmur heard.    No friction rub. No gallop.  Pulmonary:     Effort: Pulmonary effort is normal. No respiratory distress.     Breath sounds: Normal breath sounds. No stridor. No wheezing or rhonchi.  Abdominal:     General: Bowel sounds are normal.     Palpations: Abdomen is soft.     Tenderness: There is no abdominal tenderness. There is no guarding or rebound.  Musculoskeletal:        General: No swelling, tenderness or deformity.     Cervical back: Normal range of motion and neck supple. No rigidity or tenderness.  Lymphadenopathy:     Head:     Right side of head: No submental, submandibular, tonsillar, preauricular, posterior auricular or occipital adenopathy.     Left side of head: No submental, submandibular, tonsillar, preauricular, posterior auricular or occipital adenopathy.     Cervical: No cervical adenopathy.     Right cervical: No superficial, deep or posterior cervical adenopathy.    Left cervical: No superficial, deep or posterior cervical adenopathy.     Upper Body:     Right upper body: No supraclavicular, axillary, pectoral or epitrochlear adenopathy.     Left upper body: No supraclavicular, axillary, pectoral or epitrochlear adenopathy.  Skin:    General: Skin is warm.      Coloration: Skin is not jaundiced.     Comments: Follicular rash on extensor surfaces of arms. Firm subcutaneous, mobile nodules on dorsum of forearms   Neurological:     General: No focal deficit present.     Mental Status: She is alert and oriented to person, place, and time.     Cranial Nerves: No cranial nerve deficit.  Psychiatric:        Mood and Affect: Mood normal.        Behavior: Behavior normal.        Thought Content: Thought content normal.        Judgment: Judgment normal.     LABORATORY DATA: I have personally reviewed the data as listed:  Appointment on 04/05/2022  Component Date Value Ref Range Status   WBC Count 04/05/2022 9.8  4.0 - 10.5 K/uL Final   RBC 04/05/2022 5.06  3.87 - 5.11 MIL/uL Final   Hemoglobin 04/05/2022 12.9  12.0 - 15.0 g/dL Final   HCT 04/05/2022 39.9  36.0 - 46.0 % Final   MCV 04/05/2022 78.9 (L)  80.0 - 100.0 fL Final   MCH 04/05/2022 25.5 (L)  26.0 - 34.0 pg Final   MCHC 04/05/2022  32.3  30.0 - 36.0 g/dL Final   RDW 04/05/2022 17.2 (H)  11.5 - 15.5 % Final   Platelet Count 04/05/2022 125 (L)  150 - 400 K/uL Final   PLATELET COUNT CONFIRMED BY SMEAR   nRBC 04/05/2022 0.0  0.0 - 0.2 % Final   Neutrophils Relative % 04/05/2022 71  % Final   Neutro Abs 04/05/2022 6.9  1.7 - 7.7 K/uL Final   Lymphocytes Relative 04/05/2022 18  % Final   Lymphs Abs 04/05/2022 1.7  0.7 - 4.0 K/uL Final   Monocytes Relative 04/05/2022 6  % Final   Monocytes Absolute 04/05/2022 0.6  0.1 - 1.0 K/uL Final   Eosinophils Relative 04/05/2022 4  % Final   Eosinophils Absolute 04/05/2022 0.4  0.0 - 0.5 K/uL Final   Basophils Relative 04/05/2022 1  % Final   Basophils Absolute 04/05/2022 0.1  0.0 - 0.1 K/uL Final   Immature Granulocytes 04/05/2022 0  % Final   Abs Immature Granulocytes 04/05/2022 0.04  0.00 - 0.07 K/uL Final   Performed at [facility], [place] [address], [place], [place] [address]   Sodium 04/05/2022 140  135 - 145 mmol/L Final    Potassium 04/05/2022 3.7  3.5 - 5.1 mmol/L Final   Chloride 04/05/2022 110  98 - 111 mmol/L Final   CO2 04/05/2022 23  22 - 32 mmol/L Final   Glucose, Bld 04/05/2022 96  70 - 99 mg/dL Final   Glucose reference range applies only to samples taken after fasting for at least 8 hours.   BUN 04/05/2022 21 (H)  6 - 20 mg/dL Final   Creatinine 04/05/2022 0.69  0.44 - 1.00 mg/dL Final   Calcium 04/05/2022 9.5  8.9 - 10.3 mg/dL Final   Total Protein 04/05/2022 7.1  6.5 - 8.1 g/dL Final   Albumin 04/05/2022 4.3  3.5 - 5.0 g/dL Final   AST 04/05/2022 14 (L)  15 - 41 U/L Final   ALT 04/05/2022 15  0 - 44 U/L Final   Alkaline Phosphatase 04/05/2022 82  38 - 126 U/L Final   Total Bilirubin 04/05/2022 0.4  0.3 - 1.2 mg/dL Final   GFR, Estimated 04/05/2022 >60  >60 mL/min Final   Comment: (NOTE) Calculated using the CKD-EPI Creatinine Equation (2021)    Anion gap 04/05/2022 7  5 - 15 Final   Performed at [facility], [place] [address], [place], [place] [address]   Cold Agglutinin Titer 04/05/2022 Negative  Neg <1:32 Final   Comment: (NOTE) Performed At: [facility] [place], [place] [id number] Rush Farmer MD QQ:[id number]    DAT, complement 04/05/2022 NEG   Final   DAT, IgG 04/05/2022    Final                   Value:NEG Performed at [facility], [place] [address] [place], [place] [address]    Ferritin 04/05/2022 87  11 - 307 ng/mL Final   Performed at [facility], [address] [place], [place] [address]   Folate 04/05/2022 9.9  >5.9 ng/mL Final   Performed at [facility], [place] [address] [place], [place] [address]   Haptoglobin 04/05/2022 199  42 - 296 mg/dL Final   Comment: (NOTE) Performed At: [facility] [place], [place] [id number] Rush Farmer MD ZC:[id number]    Vitamin B-12 04/05/2022 629  180 - 914 pg/mL Final   Comment: (NOTE) This  assay is  not validated for testing neonatal or myeloproliferative syndrome specimens for Vitamin B12 levels. Performed at [facility], [place] [address], [place], [place] [address]    Retic Ct Pct 04/05/2022 1.4  0.4 - 3.1 % Final   RBC. 04/05/2022 5.07  3.87 - 5.11 MIL/uL Final   Retic Count, Absolute 04/05/2022 73.0  19.0 - 186.0 K/uL Final   Immature Retic Fract 04/05/2022 23.2 (H)  2.3 - 15.9 % Final   Performed at [facility], [place] [address], [place], [place] [address]   Kappa free light chain 04/05/2022 17.2  3.3 - 19.4 mg/L Final   Lambda free light chains 04/05/2022 11.4  5.7 - 26.3 mg/L Final   Kappa, lambda light chain ratio 04/05/2022 1.51  0.26 - 1.65 Final   Comment: (NOTE) Performed At: [facility] [place], [place] [id number] Rush Farmer MD [id number]    IgG (Immunoglobin G), Serum 04/05/2022 668  586 - 1,602 mg/dL Final   IgA 04/05/2022 87  87 - 352 mg/dL Final   IgM (Immunoglobulin M), Srm 04/05/2022 113  26 - 217 mg/dL Final   Total Protein ELP 04/05/2022 6.5  6.0 - 8.5 g/dL Corrected   Albumin SerPl Elph-Mcnc 04/05/2022 3.8  2.9 - 4.4 g/dL Corrected   Alpha 1 04/05/2022 0.3  0.0 - 0.4 g/dL Corrected   Alpha2 Glob SerPl Elph-Mcnc 04/05/2022 0.8  0.4 - 1.0 g/dL Corrected   B-Globulin SerPl Elph-Mcnc 04/05/2022 1.0  0.7 - 1.3 g/dL Corrected   Gamma Glob SerPl Elph-Mcnc 04/05/2022 0.6  0.4 - 1.8 g/dL Corrected   M Protein SerPl Elph-Mcnc 04/05/2022 Not Observed  Not Observed g/dL Corrected   Globulin, Total 04/05/2022 2.7  2.2 - 3.9 g/dL Corrected   Albumin/Glob SerPl 04/05/2022 1.5  0.7 - 1.7 Corrected   IFE 1 04/05/2022 Comment   Corrected   Comment: (NOTE) The immunofixation pattern appears unremarkable. Evidence of monoclonal protein is not apparent.    Please Note 04/05/2022 Comment   Corrected   Comment: (NOTE) Protein electrophoresis scan will follow via computer, mail, or courier  delivery. Performed At: [facility] [place], [place] [id number] Rush Farmer MD DJ:[id number]    Hepatitis B Surface Ag 04/05/2022 NON REACTIVE  NON REACTIVE Final   HCV Ab 04/05/2022 NON REACTIVE  NON REACTIVE Final   Comment: (NOTE) Nonreactive HCV antibody screen is consistent with no HCV infections,  unless recent infection is suspected or other evidence exists to indicate HCV infection.     Hep A IgM 04/05/2022 NON REACTIVE  NON REACTIVE Final   Hep B C IgM 04/05/2022 NON REACTIVE  NON REACTIVE Final   Performed at [facility], [place] [address], [place], [place] [address]   HIV Screen 4th Generation wRfx 04/05/2022 Non Reactive  Non Reactive Final   Performed at [facility], [place] [address] [place], [place] [address]   Beta-2 Glyco I IgG 04/05/2022 <9  0 - 20 GPI IgG units Final   Comment: (NOTE) The reference interval reflects a 3SD or 99th percentile interval, which is thought to represent a potentially clinically significant result in accordance with the International Consensus Statement on the classification criteria for definitive antiphospholipid syndrome (APS). J Thromb Haem 2006;4:295-306.    Beta-2-Glycoprotein I IgM 04/05/2022 <9  0 - 32 GPI IgM units Final   Comment: (NOTE) The reference interval reflects a 3SD or 99th percentile interval, which is thought to represent a  potentially clinically significant result in accordance with the International Consensus Statement on the classification criteria for definitive antiphospholipid syndrome (APS). J Thromb Haem 2006;4:295-306. Performed At: [facility] [place], [place] [id number] Rush Farmer MD JK:[id number]    Beta-2-Glycoprotein I IgA 04/05/2022 <9  0 - 25 GPI IgA units Final   Comment: (NOTE) The reference interval reflects a 3SD or 99th percentile interval, which is thought to represent a potentially clinically significant result in accordance  with the International Consensus Statement on the classification criteria for definitive antiphospholipid syndrome (APS). J Thromb Haem 2006;4:295-306.    Anticardiolipin IgG 04/05/2022 <9  0 - 14 GPL U/mL Final   Comment: (NOTE)                          Negative:              <15                          Indeterminate:     15 - 20                          Low-Med Positive: >20 - 80                          High Positive:         >80    Anticardiolipin IgM 04/05/2022 <9  0 - 12 MPL U/mL Final   Comment: (NOTE)                          Negative:              <13                          Indeterminate:     13 - 20                          Low-Med Positive: >20 - 80                          High Positive:         >80 Performed At: [facility] [place], [place] [id number] Rush Farmer MD YB:[id number]    aPTT 04/05/2022 27  24 - 36 seconds Final   Performed at [facility], [place] [address], [place], [place] [address]   Prothrombin Time 04/05/2022 14.5  11.4 - 15.2 seconds Final   INR 04/05/2022 1.1  0.8 - 1.2 Final   Comment: (NOTE) INR goal varies based on device and disease states. Performed at [facility], [place] [address], [place], [place] [address]    WBC MORPHOLOGY 04/05/2022 MORPHOLOGY UNREMARKABLE   Final   RBC MORPHOLOGY 04/05/2022 OVALOCYTES   Final   Plt Morphology 04/05/2022 Normal platelet morphology   Final   Clinical Information 04/05/2022 thrombocytopenia   Final   Performed at [facility], [place] [address] [place], [place] [address]   Fibrinogen 04/05/2022 453  210 - 475 mg/dL Final   Comment: (NOTE) Fibrinogen results may be underestimated in patients receiving thrombolytic therapy. Performed at [facility], [place] [address] [place], [place] [address]  RADIOGRAPHIC STUDIES: I have personally reviewed the radiological images as listed and agree with the  findings in the report  No results found.  ASSESSMENT/PLAN  52 y.o. female with thrombocytopenia Medical history is notable for inflammatory arthritis (lupus, psoriatic arthritis),  back pain, endometriosis, fatty liver, fibromyalgia,  sessile serrated colon polyps  Thrombocytopenia:  Mild and without bleeding complications.  Most likely causes in this patient are autoimmune syndromes, inflammation, splenomegaly, hepatic steatosis.  No need for intervention at this time  Iron deficiency:  Likely secondary to chronic GI blood loss.  Will discuss endoscopy in the future  Skin nodules: Soft tissue U/S performed to evaluate.  Awaiting results     Cancer Staging  No matching staging information was found for the patient.   No problem-specific Assessment & Plan notes found for this encounter.   No orders of the defined types were placed in this encounter.   35  minutes was spent in patient care.  This included time spent preparing to see the patient (e.g., review of tests), obtaining and/or reviewing separately obtained history, counseling and educating the patient, ordering medications, tests/ procedures; documenting clinical information in the electronic or other health record, independently interpreting results and communicating results to the patient as well as coordination of care.        All questions were answered. The patient knows to call the clinic with any problems, questions or concerns.  This note was electronically signed.    Barbee Cough, MD  04/19/2022 8:32 AM

## 2022-04-22 ENCOUNTER — Other Ambulatory Visit: Payer: Self-pay | Admitting: Family Medicine

## 2022-04-24 ENCOUNTER — Telehealth: Payer: Self-pay | Admitting: Family Medicine

## 2022-04-24 DIAGNOSIS — R229 Localized swelling, mass and lump, unspecified: Secondary | ICD-10-CM | POA: Insufficient documentation

## 2022-04-24 DIAGNOSIS — K760 Fatty (change of) liver, not elsewhere classified: Secondary | ICD-10-CM | POA: Insufficient documentation

## 2022-04-24 NOTE — Telephone Encounter (Signed)
LAST APPOINTMENT DATE:  12/25/21  NEXT APPOINTMENT DATE: 05/10/22  MEDICATION: pantoprazole (PROTONIX) 40 MG tablet  losartan (COZAAR) 100 MG tablet  topiramate (TOPAMAX) 100 MG tablet  Vitamin D, Ergocalciferol, (DRISDOL) 1.25 MG (50000 UNIT) CAPS capsule   Is the patient out of medication? Yes  PHARMACY:  [id number] [place], [place] [address] [place], [place] [address] Phone: [phone]  Fax: [phone]    All medications need to be sent as 90 day supplies so they will be covered by insurance

## 2022-04-25 NOTE — Telephone Encounter (Signed)
Can you please add losartan (COZAAR) 100 MG tablet   To the list of med refill and also please change to 90 days for insurance purposes on ALL med refills. Please call pt back with details.

## 2022-05-01 NOTE — Telephone Encounter (Signed)
Rx already in patient medication list

## 2022-05-09 ENCOUNTER — Other Ambulatory Visit: Payer: Self-pay | Admitting: Internal Medicine

## 2022-05-10 ENCOUNTER — Encounter: Payer: Commercial Managed Care - PPO | Admitting: Family Medicine

## 2022-05-22 ENCOUNTER — Ambulatory Visit: Payer: Commercial Managed Care - PPO | Admitting: Physician Assistant

## 2022-05-22 ENCOUNTER — Encounter: Payer: Self-pay | Admitting: Physician Assistant

## 2022-05-22 ENCOUNTER — Other Ambulatory Visit (HOSPITAL_COMMUNITY)
Admission: RE | Admit: 2022-05-22 | Discharge: 2022-05-22 | Disposition: A | Discharge status: Home / Self Care | Payer: Commercial Managed Care - PPO | Source: Ambulatory Visit | Attending: Physician Assistant | Admitting: Physician Assistant

## 2022-05-22 VITALS — BP 120/88 | HR 92 | Resp 16 | Ht 64.0 in | Wt 241.0 lb

## 2022-05-22 DIAGNOSIS — Z202 Contact with and (suspected) exposure to infections with a predominantly sexual mode of transmission: Secondary | ICD-10-CM | POA: Insufficient documentation

## 2022-05-22 NOTE — Progress Notes (Signed)
Subjective:    Patient ID: Melinda Porter, female    DOB: 08-06-1970, 52 y.o.   MRN: [id number]  Chief Complaint  Patient presents with   Exposure to STD    New sexual partner for 4 months. Told a few days ago he is HIV positive, but then yesterday got a phone call that he was drunk and not being truthful    Exposure to STD    Patient is in today for possible exposure to STD. States she has had a new female sexual partner for the last 4 months. Not using condoms or other barriers. Pt told her he was HIV positive, but then said he was drunk and not honest. She has hx of autoimmune issues, would like lab testing done to be sure. No fever, chills, bleeding, vaginal discharge or odor, no other symptoms.   Past Medical History:  Diagnosis Date   Anemia    Anxiety    Arthritis    Back pain    Bilateral swelling of feet    Depression    Endometriosis    Fatty liver    Fibromyalgia    Fibromyalgia    GERD (gastroesophageal reflux disease)    Hiatal hernia    Hyperlipemia    Hypertension    IBS (irritable bowel syndrome)    Joint pain    Lupus (HCC)    Psoriatic arthritis (HCC)    PTSD (post-traumatic stress disorder)    Sjogren's disease ([place])    Sleep-disordered breathing 11/12/2021   Vitamin D deficiency     Past Surgical History:  Procedure Laterality Date   CESAREAN SECTION  1997   FLEXIBLE SIGMOIDOSCOPY N/A 01/06/2017   Procedure: FLEXIBLE SIGMOIDOSCOPY;  Surgeon: Yetta Flock, MD;  Location: [facility];  Service: Gastroenterology;  Laterality: N/A;   OOPHORECTOMY Bilateral    PARTIAL HYSTERECTOMY  07/2009   TEMPOROMANDIBULAR JOINT SURGERY  2009   right side   TONSILLECTOMY  2009    Family History  Problem Relation Age of Onset   Colon polyps Maternal Grandmother    Lung cancer Maternal Grandmother    Liver disease Father        agent orange   Alcoholism Father    Thyroid disease Mother    Diabetes Maternal Grandfather    Heart disease Maternal  Grandfather    Hypertension Maternal Grandfather    Colon cancer Maternal Uncle        great uncle   Breast cancer Neg Hx    Esophageal cancer Neg Hx    Stomach cancer Neg Hx    Rectal cancer Neg Hx     Social History   Tobacco Use   Smoking status: Former    Types: Cigarettes    Quit date: 03/09/1999    Years since quitting: 23.2   Smokeless tobacco: Never  Vaping Use   Vaping Use: Never used  Substance Use Topics   Alcohol use: No    Alcohol/week: 0.0 standard drinks of alcohol   Drug use: No     Allergies  Allergen Reactions   Lyrica [Pregabalin] Shortness Of Breath   Sulfa Antibiotics Rash and Other (See Comments)   Ciprofloxacin Nausea Only   Gabapentin Other (See Comments)    Stroke like symptoms   Morphine Sulfate     Other reaction(s): Unknown   Nsaids Other (See Comments)    Liver problems- can't take tylenol, ibuprofen etc.   Prednisone Other (See Comments)    "I get deathly ill"  Remicade [Infliximab]    Tolmetin     Other reaction(s): Other (See Comments) Liver problems- can't take tylenol, ibuprofen etc.   Codeine Rash    Other reaction(s): Unknown   Humira [Adalimumab] Rash    Worsening of Lupus rash.   Morphine And Related Rash    "Wired up"   Penicillins Rash    Has patient had a PCN reaction causing immediate rash, facial/tongue/throat swelling, SOB or lightheadedness with hypotension: NO Has patient had a PCN reaction causing severe rash involving mucus membranes or skin necrosis: NO Has patient had a PCN reaction that required hospitalization NO Has patient had a PCN reaction occurring within the last 10 years: NO If all of the above answers are "NO", then may proceed with Cephalosporin use.    Review of Systems NEGATIVE UNLESS OTHERWISE INDICATED IN HPI      Objective:     BP 120/88   Pulse 92   Resp 16   Ht '5\' 4"'$  (1.626 m)   Wt 241 lb (109.3 kg)   LMP 06/23/2009   SpO2 96%   BMI 41.37 kg/m   Wt Readings from Last 3  Encounters:  05/22/22 241 lb (109.3 kg)  04/19/22 247 lb 9.6 oz (112.3 kg)  04/05/22 249 lb 4.8 oz (113.1 kg)    BP Readings from Last 3 Encounters:  05/22/22 120/88  04/19/22 (!) 144/64  04/05/22 (!) 144/83     Physical Exam Vitals and nursing note reviewed.  Constitutional:      Appearance: Normal appearance. She is obese.  Cardiovascular:     Rate and Rhythm: Normal rate and regular rhythm.     Pulses: Normal pulses.  Pulmonary:     Effort: Pulmonary effort is normal.     Breath sounds: Normal breath sounds.  Neurological:     General: No focal deficit present.     Mental Status: She is alert.  Psychiatric:        Mood and Affect: Mood normal.        Behavior: Behavior normal.        Assessment & Plan:  STD exposure -     Urine cytology ancillary only -     HIV Antibody (routine testing w rflx) -     RPR -     Hepatitis C antibody   Asymptomatic, will check STD panel, treat pending any abnormal results. Safe sex advised, AVS printed for her. Offered pelvic exam, even though she has hx hysterectomy, talked about need for regular checks for vulvar / vaginal cancers, etc, declined today, but would consider.      Return if symptoms worsen or fail to improve.     Shalena Ezzell M Zadie Deemer, PA-C

## 2022-05-23 LAB — URINE CYTOLOGY ANCILLARY ONLY
Chlamydia: NEGATIVE
Comment: NEGATIVE
Comment: NEGATIVE
Comment: NORMAL
Neisseria Gonorrhea: NEGATIVE
Trichomonas: NEGATIVE

## 2022-05-23 LAB — RPR: RPR Ser Ql: NONREACTIVE

## 2022-05-23 LAB — HEPATITIS C ANTIBODY: Hepatitis C Ab: NONREACTIVE

## 2022-05-23 LAB — HIV ANTIBODY (ROUTINE TESTING W REFLEX): HIV 1&2 Ab, 4th Generation: NONREACTIVE

## 2022-06-11 ENCOUNTER — Ambulatory Visit: Payer: Commercial Managed Care - PPO | Admitting: Family Medicine

## 2022-06-16 ENCOUNTER — Encounter: Payer: Self-pay | Admitting: Oncology

## 2022-07-02 ENCOUNTER — Other Ambulatory Visit: Payer: Self-pay | Admitting: Family Medicine

## 2022-07-08 ENCOUNTER — Encounter: Payer: Self-pay | Admitting: Family

## 2022-07-08 ENCOUNTER — Other Ambulatory Visit: Payer: Self-pay | Admitting: Family Medicine

## 2022-07-08 ENCOUNTER — Ambulatory Visit: Payer: Commercial Managed Care - PPO | Admitting: Family

## 2022-07-08 VITALS — BP 113/78 | HR 81 | Temp 98.2°F | Ht 64.0 in | Wt 245.0 lb

## 2022-07-08 DIAGNOSIS — K219 Gastro-esophageal reflux disease without esophagitis: Secondary | ICD-10-CM | POA: Diagnosis not present

## 2022-07-08 DIAGNOSIS — N6320 Unspecified lump in the left breast, unspecified quadrant: Secondary | ICD-10-CM | POA: Diagnosis not present

## 2022-07-08 DIAGNOSIS — J3489 Other specified disorders of nose and nasal sinuses: Secondary | ICD-10-CM

## 2022-07-08 DIAGNOSIS — I10 Essential (primary) hypertension: Secondary | ICD-10-CM | POA: Diagnosis not present

## 2022-07-08 DIAGNOSIS — Z1231 Encounter for screening mammogram for malignant neoplasm of breast: Secondary | ICD-10-CM

## 2022-07-08 MED ORDER — PANTOPRAZOLE SODIUM 40 MG PO TBEC: 40.0000 mg | DELAYED_RELEASE_TABLET | Freq: Every day | ORAL | 0 refills | Status: DC

## 2022-07-08 MED ORDER — LOSARTAN POTASSIUM 100 MG PO TABS: ORAL_TABLET | ORAL | 0 refills | Status: DC

## 2022-07-08 NOTE — Progress Notes (Signed)
Patient ID: Melinda Porter, female    DOB: [birth year], 52 y.o.   MRN: [id number]  Chief Complaint  Patient presents with   Breast Mass    Pt c/o lump on left breast. Noticed yesterday, Not painful. Lumps all over her body that started 6 months ago.      HPI:      Breast lump:  pt found 2 days ago, non-tender, located in upper left breast about 11-12:00 region, she thinks it is not fixed, as has trouble finding again. Pt reports having multiple (approx. 50 total) of cystic nodules she has found in various parts of her body over the last year and is unsure if this is related to that or something more serious.  GERD:  requesting refill of her Protonix 40mg  qd. States her insurance changed beginning of year and they require 90 day refills vs. 30 day. Pt has been unable to get off work early enough to see PCP for f/u due to last appt at 3:00 qd.  HTN:  requesting refill of Losartan 100mg  qd. States her insurance changed beginning of year and they require 90 day refills vs. 30 day. Pt has been unable to get off work early enough to see PCP for f/u due to last appt at 3:00 qd.      Assessment & Plan:  1. Mass of left breast, unspecified quadrant - 11-12:00 region upper left breast, not able to palpate today.  - US BREAST COMPLETE UNI LEFT INC AXILLA; Future - MM 3D DIAGNOSTIC MAMMOGRAM BILATERAL BREAST; Future  2. Primary hypertension - advised pt to schedule f/u with PCP before next refill.  - losartan (COZAAR) 100 MG tablet; Take 1 tablet daily  Dispense: 90 tablet; Refill: 0  3. Gastroesophageal reflux disease, unspecified whether esophagitis present advised pt to schedule f/u with PCP before next refill.  - pantoprazole (PROTONIX) 40 MG tablet; Take 1 tablet (40 mg total) by mouth daily. Please schedule an appt with Dr. [name] for further refills [phone]  Dispense: 90 tablet; Refill: 0  4. Screening mammogram for breast cancer -  - MM 3D DIAGNOSTIC MAMMOGRAM BILATERAL BREAST;  Future  Subjective:    Outpatient Medications Prior to Visit  Medication Sig Dispense Refill   abatacept (ORENCIA) 250 MG injection      Apremilast (OTEZLA) 10 & 20 & 30 MG TBPK Take by mouth.     Ascorbic Acid (VITA-C PO) Take by mouth. 3 gummies a day     clonazePAM (KLONOPIN) 1 MG tablet Take 1 tablet (1 mg total) by mouth 3 (three) times daily as needed for anxiety. 270 tablet 1   cromolyn (OPTICROM) 4 % ophthalmic solution Place 1 drop into both eyes 4 (four) times daily. 10 mL 1   cyclobenzaprine (FLEXERIL) 10 MG tablet Take 1-2 tablets (10-20 mg total) by mouth 3 (three) times daily as needed for muscle spasms. 60 tablet 0   diphenoxylate-atropine (LOMOTIL) 2.5-0.025 MG tablet Take 1 tablet by mouth 2 (two) times daily as needed for diarrhea or loose stools. 60 tablet 2   ferrous sulfate 324 (65 Fe) MG TBEC TAKE 1 TABLET(324 MG) BY MOUTH IN THE MORNING AND AT BEDTIME 60 tablet 3   fluticasone (FLONASE) 50 MCG/ACT nasal spray SHAKE LIQUID AND USE 2 SPRAYS IN EACH NOSTRIL EVERY DAY 48 g 0   glucose 4 GM chewable tablet Chew 1 tablet (4 g total) by mouth as needed for low blood sugar. 50 tablet 1   halobetasol (ULTRAVATE) 0.05 %  cream Apply topically.     losartan (COZAAR) 100 MG tablet TAKE 1 TABLET(100 MG) BY MOUTH DAILY 30 tablet 5   meclizine (ANTIVERT) 25 MG tablet Take 1 tablet (25 mg total) by mouth 3 (three) times daily as needed for dizziness. 30 tablet 0   mesalamine (LIALDA) 1.2 g EC tablet TAKE 4 TABLETS(4.8 GRAMS) BY MOUTH DAILY 360 tablet 3   ondansetron (ZOFRAN) 8 MG tablet Take 1 tablet (8 mg total) by mouth every 8 (eight) hours as needed for nausea or vomiting. 20 tablet 0   pantoprazole (PROTONIX) 40 MG tablet Take 1 tablet (40 mg total) by mouth 2 (two) times daily. Please schedule an appt with Dr. [name] for further refills [phone] 60 tablet 0   Potassium 95 MG TABS Take by mouth.     promethazine (PHENERGAN) 25 MG tablet 1 tablet as needed     sertraline  (ZOLOFT) 100 MG tablet Take 2 tablets (200 mg total) by mouth daily. 180 tablet 3   topiramate (TOPAMAX) 100 MG tablet TAKE 1 TABLET(100 MG) BY MOUTH TWICE DAILY. Please schedule an appt with Dr. [name] for further refills [phone] 60 tablet 0   valACYclovir (VALTREX) 1000 MG tablet Take 2 tablets (2,000 mg total) by mouth as needed. For two doses for cold sores. 20 tablet 0   Vitamin D, Ergocalciferol, (DRISDOL) 1.25 MG (50000 UNIT) CAPS capsule Take 1 capsule (50,000 Units total) by mouth every 7 (seven) days. 12 capsule 3   zinc gluconate 50 MG tablet Take 50 mg by mouth daily.     cephALEXin (KEFLEX) 500 MG capsule Take 1 capsule (500 mg total) by mouth 2 (two) times daily. 14 capsule 0   No facility-administered medications prior to visit.   Past Medical History:  Diagnosis Date   Anemia    Anxiety    Arthritis    Back pain    Bilateral swelling of feet    Depression    Endometriosis    Fatty liver    Fibromyalgia    Fibromyalgia    GERD (gastroesophageal reflux disease)    Hiatal hernia    Hyperlipemia    Hypertension    IBS (irritable bowel syndrome)    Joint pain    Lupus (HCC)    Psoriatic arthritis (HCC)    PTSD (post-traumatic stress disorder)    Sjogren's disease ([place])    Sleep-disordered breathing 11/12/2021   Vitamin D deficiency    Past Surgical History:  Procedure Laterality Date   CESAREAN SECTION  1997   FLEXIBLE SIGMOIDOSCOPY N/A 01/06/2017   Procedure: FLEXIBLE SIGMOIDOSCOPY;  Surgeon: Yetta Flock, MD;  Location: [facility];  Service: Gastroenterology;  Laterality: N/A;   OOPHORECTOMY Bilateral    PARTIAL HYSTERECTOMY  07/2009   TEMPOROMANDIBULAR JOINT SURGERY  2009   right side   TONSILLECTOMY  2009   Allergies  Allergen Reactions   Lyrica [Pregabalin] Shortness Of Breath   Sulfa Antibiotics Rash and Other (See Comments)   Ciprofloxacin Nausea Only   Gabapentin Other (See Comments)    Stroke like symptoms   Morphine Sulfate      Other reaction(s): Unknown   Nsaids Other (See Comments)    Liver problems- can't take tylenol, ibuprofen etc.   Prednisone Other (See Comments)    "I get deathly ill"   Remicade [Infliximab]    Tolmetin     Other reaction(s): Other (See Comments) Liver problems- can't take tylenol, ibuprofen etc.   Codeine Rash    Other reaction(s):  Unknown   Humira [Adalimumab] Rash    Worsening of Lupus rash.   Morphine And Related Rash    "Wired up"   Penicillins Rash    Has patient had a PCN reaction causing immediate rash, facial/tongue/throat swelling, SOB or lightheadedness with hypotension: NO Has patient had a PCN reaction causing severe rash involving mucus membranes or skin necrosis: NO Has patient had a PCN reaction that required hospitalization NO Has patient had a PCN reaction occurring within the last 10 years: NO If all of the above answers are "NO", then may proceed with Cephalosporin use.      Objective:    Physical Exam Vitals and nursing note reviewed.  Constitutional:      Appearance: Normal appearance. She is obese.  Cardiovascular:     Rate and Rhythm: Normal rate and regular rhythm.  Pulmonary:     Effort: Pulmonary effort is normal.     Breath sounds: Normal breath sounds.  Chest:  Breasts:    Left: Mass (small nodule, non-tender, see diagram) present. No swelling, inverted nipple, skin change or tenderness.    Musculoskeletal:        General: Normal range of motion.  Skin:    General: Skin is warm and dry.  Neurological:     Mental Status: She is alert.  Psychiatric:        Mood and Affect: Mood normal.        Behavior: Behavior normal.    BP 113/78 (BP Location: Left Arm, Patient Position: Sitting, Cuff Size: Large)   Pulse 81   Temp 98.2 F (36.8 C) (Temporal)   Ht 5\' 4"  (1.626 m)   Wt 245 lb (111.1 kg)   LMP 06/23/2009   SpO2 95%   BMI 42.05 kg/m  Wt Readings from Last 3 Encounters:  07/08/22 245 lb (111.1 kg)  05/22/22 241 lb (109.3 kg)   04/19/22 247 lb 9.6 oz (112.3 kg)       Jeanie Sewer, NP

## 2022-07-18 ENCOUNTER — Other Ambulatory Visit: Payer: Self-pay | Admitting: Family

## 2022-07-18 ENCOUNTER — Ambulatory Visit
Admission: RE | Admit: 2022-07-18 | Discharge: 2022-07-18 | Disposition: A | Discharge status: Home / Self Care | Payer: Commercial Managed Care - PPO | Source: Ambulatory Visit | Attending: Family | Admitting: Family

## 2022-07-18 DIAGNOSIS — N6320 Unspecified lump in the left breast, unspecified quadrant: Secondary | ICD-10-CM

## 2022-07-18 DIAGNOSIS — Z1231 Encounter for screening mammogram for malignant neoplasm of breast: Secondary | ICD-10-CM

## 2022-07-19 NOTE — Progress Notes (Signed)
Please let Melinda Porter know her mammogram and ultrasound are negative for any abnormal lumps or tumors. The nodule she was feeling is just normal fatty tissue, good news.

## 2022-07-27 ENCOUNTER — Other Ambulatory Visit: Payer: Self-pay | Admitting: Internal Medicine

## 2022-07-31 ENCOUNTER — Other Ambulatory Visit: Payer: Self-pay | Admitting: Family Medicine

## 2022-08-02 ENCOUNTER — Telehealth: Payer: Self-pay | Admitting: Oncology

## 2022-08-14 ENCOUNTER — Encounter: Payer: Self-pay | Admitting: Internal Medicine

## 2022-08-16 ENCOUNTER — Encounter: Payer: Commercial Managed Care - PPO | Admitting: Family Medicine

## 2022-08-16 ENCOUNTER — Inpatient Hospital Stay: Payer: Commercial Managed Care - PPO

## 2022-08-16 ENCOUNTER — Inpatient Hospital Stay: Payer: Commercial Managed Care - PPO | Admitting: Oncology

## 2022-08-16 ENCOUNTER — Encounter (INDEPENDENT_AMBULATORY_CARE_PROVIDER_SITE_OTHER): Payer: Commercial Managed Care - PPO | Admitting: Ophthalmology

## 2022-08-16 DIAGNOSIS — I10 Essential (primary) hypertension: Secondary | ICD-10-CM | POA: Diagnosis not present

## 2022-08-16 DIAGNOSIS — H35033 Hypertensive retinopathy, bilateral: Secondary | ICD-10-CM | POA: Diagnosis not present

## 2022-08-16 DIAGNOSIS — H353221 Exudative age-related macular degeneration, left eye, with active choroidal neovascularization: Secondary | ICD-10-CM | POA: Diagnosis not present

## 2022-08-16 DIAGNOSIS — H43813 Vitreous degeneration, bilateral: Secondary | ICD-10-CM | POA: Diagnosis not present

## 2022-08-16 DIAGNOSIS — H2513 Age-related nuclear cataract, bilateral: Secondary | ICD-10-CM

## 2022-08-16 NOTE — Progress Notes (Deleted)
Chief Complaint:  Melinda Porter is a 52 y.o. female who presents today for her annual comprehensive physical exam.    Assessment/Plan:  New/Acute Problems: ***  Chronic Problems Addressed Today: No problem-specific Assessment & Plan notes found for this encounter.   There is no height or weight on file to calculate BMI. / ***    Preventative Healthcare: ***  Patient Counseling(The following topics were reviewed and/or handout was given):  -Nutrition: Stressed importance of moderation in sodium/caffeine intake, saturated fat and cholesterol, caloric balance, sufficient intake of fresh fruits, vegetables, and fiber.  -Stressed the importance of regular exercise.   -Substance Abuse: Discussed cessation/primary prevention of tobacco, alcohol, or other drug use; driving or other dangerous activities under the influence; availability of treatment for abuse.   -Injury prevention: Discussed safety belts, safety helmets, smoke detector, smoking near bedding or upholstery.   -Sexuality: Discussed sexually transmitted diseases, partner selection, use of condoms, avoidance of unintended pregnancy and contraceptive alternatives.   -Dental health: Discussed importance of regular tooth brushing, flossing, and dental visits.  -Health maintenance and immunizations reviewed. Please refer to Health maintenance section.  Return to care in 1 year for next preventative visit.     Subjective:  HPI:  She has ***no acute complaints today.   Lifestyle Diet: *** Exercise: ***     07/08/2022    4:28 PM  Depression screen PHQ 2/9  Decreased Interest 0  Down, Depressed, Hopeless 0  PHQ - 2 Score 0  Altered sleeping 0  Tired, decreased energy 0  Change in appetite 0  Feeling bad or failure about yourself  0  Trouble concentrating 0  Moving slowly or fidgety/restless 0  Suicidal thoughts 0  PHQ-9 Score 0  Difficult doing work/chores Not difficult at all    Health Maintenance Due  Topic  Date Due   Zoster Vaccines- Shingrix (1 of 2) Never done     ROS: ***Per HPI, otherwise a complete review of systems was negative.   PMH:  The following were reviewed and entered/updated in epic: Past Medical History:  Diagnosis Date   Anemia    Anxiety    Arthritis    Back pain    Bilateral swelling of feet    Depression    Endometriosis    Fatty liver    Fibromyalgia    Fibromyalgia    GERD (gastroesophageal reflux disease)    Hiatal hernia    Hyperlipemia    Hypertension    IBS (irritable bowel syndrome)    Joint pain    Lupus (HCC)    Psoriatic arthritis (HCC)    PTSD (post-traumatic stress disorder)    Sjogren's disease (HCC)    Sleep-disordered breathing 11/12/2021   Vitamin D deficiency    Patient Active Problem List   Diagnosis Date Noted   Hepatic steatosis 04/24/2022   Multiple skin nodules 04/24/2022   Thrombocytopenia (HCC) 04/05/2022   Splenomegaly 04/05/2022   Sleep-disordered breathing 11/12/2021   Elevated lipase 10/23/2021   Prediabetes 01/01/2021   Morbid obesity (HCC) 07/10/2020   Depression, major, single episode, moderate (HCC) 05/29/2020   Anxiety 05/29/2020   PTSD (post-traumatic stress disorder) 05/29/2020   Vitamin D deficiency 05/29/2020   Iron deficiency anemia 05/29/2020   GERD (gastroesophageal reflux disease) 05/29/2020   Sjogren's syndrome (HCC) 05/29/2020   Seasonal allergies 05/29/2020   Vitamin B12 deficiency 05/29/2020   Cold sore 05/29/2020   Systemic lupus erythematosus (HCC) 12/29/2019   Ulcerative colitis (HCC) 01/05/2017   Persistent headaches  11/05/2014   Facial droop 11/05/2014   Syncope 11/05/2014   Hypertension 11/05/2014   Hyperlipemia 11/05/2014   Psoriatic arthritis (HCC) 11/05/2014   Past Surgical History:  Procedure Laterality Date   CESAREAN SECTION  1997   FLEXIBLE SIGMOIDOSCOPY N/A 01/06/2017   Procedure: FLEXIBLE SIGMOIDOSCOPY;  Surgeon: Benancio Deeds, MD;  Location: [facility];  Service:  Gastroenterology;  Laterality: N/A;   OOPHORECTOMY Bilateral    PARTIAL HYSTERECTOMY  07/2009   TEMPOROMANDIBULAR JOINT SURGERY  2009   right side   TONSILLECTOMY  2009    Family History  Problem Relation Age of Onset   Colon polyps Maternal Grandmother    Lung cancer Maternal Grandmother    Liver disease Father        agent orange   Alcoholism Father    Thyroid disease Mother    Diabetes Maternal Grandfather    Heart disease Maternal Grandfather    Hypertension Maternal Grandfather    Colon cancer Maternal Uncle        great uncle   Breast cancer Neg Hx    Esophageal cancer Neg Hx    Stomach cancer Neg Hx    Rectal cancer Neg Hx     Medications- reviewed and updated Current Outpatient Medications  Medication Sig Dispense Refill   abatacept (ORENCIA) 250 MG injection      Apremilast (OTEZLA) 10 & 20 & 30 MG TBPK Take by mouth.     Ascorbic Acid (VITA-C PO) Take by mouth. 3 gummies a day     clonazePAM (KLONOPIN) 1 MG tablet Take 1 tablet (1 mg total) by mouth 3 (three) times daily as needed for anxiety. 270 tablet 1   cromolyn (OPTICROM) 4 % ophthalmic solution Place 1 drop into both eyes 4 (four) times daily. 10 mL 1   cyclobenzaprine (FLEXERIL) 10 MG tablet Take 1-2 tablets (10-20 mg total) by mouth 3 (three) times daily as needed for muscle spasms. 60 tablet 0   diphenoxylate-atropine (LOMOTIL) 2.5-0.025 MG tablet Take 1 tablet by mouth 2 (two) times daily as needed for diarrhea or loose stools. 60 tablet 2   ferrous sulfate 324 (65 Fe) MG TBEC TAKE 1 TABLET(324 MG) BY MOUTH IN THE MORNING AND AT BEDTIME 180 tablet 1   fluticasone (FLONASE) 50 MCG/ACT nasal spray SHAKE LIQUID AND USE 2 SPRAYS IN EACH NOSTRIL EVERY DAY 48 g 0   glucose 4 GM chewable tablet Chew 1 tablet (4 g total) by mouth as needed for low blood sugar. 50 tablet 1   halobetasol (ULTRAVATE) 0.05 % cream Apply topically.     losartan (COZAAR) 100 MG tablet Take 1 tablet daily 90 tablet 0   meclizine  (ANTIVERT) 25 MG tablet Take 1 tablet (25 mg total) by mouth 3 (three) times daily as needed for dizziness. 30 tablet 0   mesalamine (LIALDA) 1.2 g EC tablet TAKE 4 TABLETS(4.8 GRAMS) BY MOUTH DAILY 360 tablet 3   ondansetron (ZOFRAN) 8 MG tablet Take 1 tablet (8 mg total) by mouth every 8 (eight) hours as needed for nausea or vomiting. 20 tablet 0   pantoprazole (PROTONIX) 40 MG tablet Take 1 tablet (40 mg total) by mouth daily. Please schedule an appt with Dr. Jimmey Ralph for further refills [phone] 90 tablet 0   Potassium 95 MG TABS Take by mouth.     promethazine (PHENERGAN) 25 MG tablet 1 tablet as needed     sertraline (ZOLOFT) 100 MG tablet Take 2 tablets (200 mg total) by mouth daily.  180 tablet 3   topiramate (TOPAMAX) 100 MG tablet TAKE 1 TABLET(100 MG) BY MOUTH TWICE DAILY. NEED APPOINTMENT FOR MORE REFILLS 60 tablet 0   valACYclovir (VALTREX) 1000 MG tablet Take 2 tablets (2,000 mg total) by mouth as needed. For two doses for cold sores. 20 tablet 0   Vitamin D, Ergocalciferol, (DRISDOL) 1.25 MG (50000 UNIT) CAPS capsule Take 1 capsule (50,000 Units total) by mouth every 7 (seven) days. 12 capsule 3   zinc gluconate 50 MG tablet Take 50 mg by mouth daily.     No current facility-administered medications for this visit.    Allergies-reviewed and updated Allergies  Allergen Reactions   Lyrica [Pregabalin] Shortness Of Breath   Sulfa Antibiotics Rash and Other (See Comments)   Ciprofloxacin Nausea Only   Gabapentin Other (See Comments)    Stroke like symptoms   Morphine Sulfate     Other reaction(s): Unknown   Nsaids Other (See Comments)    Liver problems- can't take tylenol, ibuprofen etc.   Prednisone Other (See Comments)    "I get deathly ill"   Remicade [Infliximab]    Tolmetin     Other reaction(s): Other (See Comments) Liver problems- can't take tylenol, ibuprofen etc.   Codeine Rash    Other reaction(s): Unknown   Humira [Adalimumab] Rash    Worsening of Lupus  rash.   Morphine And Related Rash    "Wired up"   Penicillins Rash    Has patient had a PCN reaction causing immediate rash, facial/tongue/throat swelling, SOB or lightheadedness with hypotension: NO Has patient had a PCN reaction causing severe rash involving mucus membranes or skin necrosis: NO Has patient had a PCN reaction that required hospitalization NO Has patient had a PCN reaction occurring within the last 10 years: NO If all of the above answers are "NO", then may proceed with Cephalosporin use.    Social History   Socioeconomic History   Marital status: Widowed    Spouse name: Not on file   Number of children: 1   Years of education: Not on file   Highest education level: Not on file  Occupational History   Occupation: work from home [employer]  Tobacco Use   Smoking status: Former    Types: Cigarettes    Quit date: 03/09/1999    Years since quitting: 23.4   Smokeless tobacco: Never  Vaping Use   Vaping Use: Never used  Substance and Sexual Activity   Alcohol use: No    Alcohol/week: 0.0 standard drinks of alcohol   Drug use: No   Sexual activity: Not Currently    Birth control/protection: Surgical  Other Topics Concern   Not on file  Social History Narrative   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   Social Determinants of Health   Financial Resource Strain: Not on file  Food Insecurity: Not on file  Transportation Needs: Not on file  Physical Activity: Not on file  Stress: Not on file  Social Connections: Not on file        Objective:  Physical Exam: LMP 06/23/2009   There is no height or weight on file to calculate BMI. Wt Readings from Last 3 Encounters:  07/08/22 245 lb (111.1 kg)  05/22/22 241 lb (109.3 kg)  04/19/22 247 lb 9.6 oz (112.3 kg)   Gen: NAD, resting comfortably*** HEENT: TMs normal bilaterally. OP clear. No thyromegaly noted.  CV: RRR with no murmurs appreciated Pulm: NWOB, CTAB with no crackles, wheezes, or rhonchi GI: Normal bowel sounds  present. Soft, Nontender, Nondistended. MSK: no edema, cyanosis, or clubbing noted Skin: warm, dry Neuro: CN2-12 grossly intact. Strength 5/5 in upper and lower extremities. Reflexes symmetric and intact bilaterally.  Psych: Normal affect and thought content     Catelyn Friel M. Jimmey Ralph, MD 08/16/2022 9:46 AM

## 2022-08-19 ENCOUNTER — Telehealth: Payer: Self-pay | Admitting: Internal Medicine

## 2022-08-19 NOTE — Telephone Encounter (Signed)
Hi Dr. Marina Goodell,  Patient called to schedule her recall. Upon scheduling I noticed there are two recall dates in Epic for her. The first is due now in May 2024 and the other says October 2026, I also see a recent recall assessment sheet. I went ahead and scheduled her in June. Please confirm that is correct.     Thanks

## 2022-08-22 ENCOUNTER — Ambulatory Visit (AMBULATORY_SURGERY_CENTER): Payer: Commercial Managed Care - PPO

## 2022-08-22 VITALS — Ht 64.0 in | Wt 240.0 lb

## 2022-08-22 DIAGNOSIS — Z86010 Personal history of colonic polyps: Secondary | ICD-10-CM

## 2022-08-22 MED ORDER — SUTAB 1479-225-188 MG PO TABS: 1.0000 | ORAL_TABLET | ORAL | 0 refills | Status: DC

## 2022-08-22 NOTE — Progress Notes (Signed)
Pre visit completed via phone call; Patient verified name, DOB, and address; Patient reports wt=240 lbs No egg or soy allergy known to patient; patient reports she does not eat eggs, however, she does eat cakes/pies with eggs in them withOUT any issues;  No issues known to pt with past sedation with any surgeries or procedure---other than PONV; Patient denies ever being told they had issues or difficulty with intubation---other than PONV; No FH of Malignant Hyperthermia; Pt is not on diet pills; Pt is not on home 02;  Pt is not on blood thinners;  Pt denies issues with constipation; patient was advised she is not allowed to take Lomotil x 5 days prior to her procedure;   No A fib or A flutter; Have any cardiac testing pending--NO Pt instructed to use Singlecare.com or GoodRx for a price reduction on prep;   Insurance verified during PV appt=[employer]   Patient's chart reviewed by Cathlyn Parsons CNRA prior to previsit and patient appropriate for the [facility].  Previsit completed and red dot placed by patient's name on their procedure day (on provider's schedule).    Patient adamantly requested to have [place] RX instead of Suprep;  Instructions sent via MyChart per patient request;  [facility] [id number]  PCN: [id number]  GROUP: [id number]  MEMBER ID: [id number])

## 2022-08-26 NOTE — Telephone Encounter (Signed)
That looks fine.  Thanks.

## 2022-09-05 ENCOUNTER — Other Ambulatory Visit: Payer: Self-pay | Admitting: Oncology

## 2022-09-05 DIAGNOSIS — K51919 Ulcerative colitis, unspecified with unspecified complications: Secondary | ICD-10-CM

## 2022-09-05 NOTE — Progress Notes (Unsigned)
[facility] Cancer Follow up  Visit:  Patient Care Team: Ardith Dark, MD as PCP - General (Family Medicine) End, Cristal Deer, MD as PCP - Cardiology (Cardiology) Loni Muse, MD as Consulting Physician (Hematology)  CHIEF COMPLAINTS/PURPOSE OF CONSULTATION:  HISTORY OF PRESENTING ILLNESS: Melinda Porter 52 y.o. female is here because of thrombocytopenia Medical history is notable for arthritis, back pain, endometriosis, fatty liver, fibromyalgia, lupus, psoriatic arthritis sessile serrated colon polyps   October 15, 2021 CT abdomen pelvis with contrast.No focal hepatic lesion. Spleen measures 13.0 by 12.6 x 7.7 cm (volume = 660 cm^3). Minimal change from comparison exam. Lipase 269  October 23, 2021: IgG4 1  January 16, 2022: EGD  The esophagus was normal.  A few small inflammatory nodules in the antrum. The stomach was otherwise  normal.  Duodenum was normal. Colonoscopy  The terminal ileum appeared normal. Colon with patchy erythema throughout. Scarring in the region of the hepatic flexure,  again noted. Colon otherwise normal  February 04, 2022: WBC 9.9 hemoglobin 12.7 MCV 78 platelet count 130; 67 segs 22 lymphs 6 monos 4 eos 1 basophil  March 26, 2022: Right upper quadrant abdominal ultrasound.  Hepatic steatosis  April 05 2022:  [facility] Hematology Consult  No history of blood transfusion.  Skin rash on arms and legs.  Hard mobile nodules on arms have emerged over the last 2 months.    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  Tobacco none.  EtOH none  [facility] Mother alive 59 thyroid problems Father died 42's liver disease Sister alive 45 autoimmune disease  WBC 9.8 hemoglobin 12.9 MCV 79 count 125; 71 segs 18 lymphs 6 monos 4 eos 1 basophil morphology confirmed thrombocytopenia but also demonstrated ovalocytes Coombs test negative.  Cold agglutinin titer negative  Haptoglobin 199 SPEP with IEP showed no paraprotein serum free kappa  17.2 lambda 11.4 with a kappa lambda 1.51 IgG 668 IgA 87 IgM 113 INR 1.1 PTT 27 fibrinogen 453 Anticardiolipin antibody negative antibeta-2 glycoprotein negative Ferritin 87 folate 9.9 B12 629 Hepatitis ABC serologies negative HIV negative  CMP notable for AST of 14  April 19 2022:  Scheduled follow up for thrombocytopenia.  Reviewed results of labs with patient.   Patient updated her [facility] Sister alive 8 and has ITP and Sjogren's syndrome.  PLT averages 85K.   Takes iron bid.  Had ice pica but not currently.     U/S of skin nodules Complex cystic appearing lesions in the subcutaneous fat potentially some type of complex ganglion cyst. MRI without and with contrast suggested for further evaluation.  July 18 2022:  Bilateral diagnostic mammogram Birads 1.  U.S left breast showing normal appearing fibrofatty tissue in the area of concern.  Sep 06 2022:  Scheduled follow up for thrombocytopenia.  Reviewed results of imaging with patient.  She would like to be seen at [facility].  Recommended the referral be made by PCP.  No bleeding issues except for gingival bleeding.  Has not seen dentist recently; had appointment but had to reschedule because of work commitment.  (Dental issues not unusual for Sjogrens and likely accounts for gingival bleeding)  Recently diagnosed with macular degeneration L> R and undergoing injections.  Taking oral iron bid and tolerating it despite ulcerative colitis.    Ferritin 84  Review of Systems  Constitutional:  Positive for fatigue. Negative for appetite change, chills and fever.       Has gingival  bleeding. Notes dried blood in AM when wakes up.  Bleeds when brushes teeth.  Last dental visit was "awhile"  Has dry mouth due to Sjogrens syndrome Weight fluctuates  HENT:   Positive for mouth sores and nosebleeds. Negative for hearing loss, lump/mass, sore throat, trouble swallowing and voice change.        Epistaxis not significant enough to  cause her to go to ED  Eyes:  Negative for eye problems and icterus.       Vision changes:  None  Respiratory:  Negative for chest tightness, cough, hemoptysis and wheezing.        DOE:  Walking in a hurry  Cardiovascular:  Positive for leg swelling. Negative for palpitations.       Chest discomfort if cleaning the house  Gastrointestinal:  Positive for diarrhea and nausea. Negative for blood in stool, constipation and vomiting.       Periodic abdominal pain when has ulcerative colitis flare  Endocrine: Negative for hot flashes.       Cold intolerance:  none Heat intolerance:  none  Genitourinary:  Negative for bladder incontinence, difficulty urinating, dysuria, frequency, hematuria and nocturia.   Musculoskeletal:  Positive for arthralgias, back pain and myalgias.  Skin:  Positive for itching and rash. Negative for wound.       Rashes owing to psoriasis and lupus  Neurological:  Positive for dizziness, headaches and numbness. Negative for extremity weakness, light-headedness and speech difficulty.  Hematological:  Positive for adenopathy. Bruises/bleeds easily.  Psychiatric/Behavioral:  Negative for suicidal ideas. The patient is not nervous/anxious.        Does not sleep well at night    MEDICAL HISTORY: Past Medical History:  Diagnosis Date   Anemia    Anxiety    Arthritis    Back pain    Bilateral swelling of feet    Cataract    not a surgical candidate at this time (08/22/2022)   Depression    Endometriosis    Fatty liver    Fibromyalgia    GERD (gastroesophageal reflux disease)    Hiatal hernia    Hyperlipemia    Hypertension    IBS (irritable bowel syndrome)    Joint pain    Lupus (HCC)    Macular degeneration of both eyes    PONV (postoperative nausea and vomiting)    Psoriatic arthritis (HCC)    PTSD (post-traumatic stress disorder)    Seasonal allergies    Sjogren's disease (HCC)    Sleep-disordered breathing 11/12/2021   UC (ulcerative colitis) (HCC)     Vitamin D deficiency     SURGICAL HISTORY: Past Surgical History:  Procedure Laterality Date   CESAREAN SECTION  1997   COLONOSCOPY  2021   JP-MAC-suprep(exc)-patchy erythema otherwise normal - recall 5 years   FLEXIBLE SIGMOIDOSCOPY N/A 01/06/2017   Procedure: FLEXIBLE SIGMOIDOSCOPY;  Surgeon: Benancio Deeds, MD;  Location: [facility] ENDOSCOPY;  Service: Gastroenterology;  Laterality: N/A;   OOPHORECTOMY Bilateral    PARTIAL HYSTERECTOMY  07/2009   TEMPOROMANDIBULAR JOINT SURGERY Right 2009   TONSILLECTOMY  2009    SOCIAL HISTORY: Social History   Socioeconomic History   Marital status: Widowed    Spouse name: Not on file   Number of children: 1   Years of education: Not on file   Highest education level: Not on file  Occupational History   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   Social Determinants of Health   Financial Resource Strain: Not on file  Food Insecurity: Not on file  Transportation Needs: Not on file  Physical Activity: Not on file  Stress: Not on file  Social Connections: Not on file  Intimate Partner Violence: Not on file    FAMILY HISTORY Family History  Problem Relation Age of Onset   Colon polyps Maternal Grandmother    Lung cancer Maternal Grandmother    Liver disease Father        agent orange   Alcoholism Father    Thyroid disease Mother    Diabetes Maternal Grandfather    Heart disease Maternal Grandfather    Hypertension Maternal Grandfather    Colon cancer Maternal Uncle        great  uncle   Breast cancer Neg Hx    Esophageal cancer Neg Hx    Stomach cancer Neg Hx    Rectal cancer Neg Hx     ALLERGIES:  is allergic to lyrica [pregabalin], sulfa antibiotics, ciprofloxacin, gabapentin, morphine sulfate, nsaids, prednisone, remicade [infliximab], tolmetin, codeine, humira [adalimumab], morphine and codeine, and penicillins.  MEDICATIONS:  Current Outpatient Medications  Medication Sig Dispense Refill   Apremilast 30 MG TABS Take 1 tablet by mouth in the morning and at bedtime.     Ascorbic Acid (VITA-C PO) Take 3 tablets by mouth daily at 6 (six) AM. 3 gummies a day     clonazePAM (KLONOPIN) 1 MG tablet Take 1 tablet (1 mg total) by mouth 3 (three) times daily as needed for anxiety. 270 tablet 1   cromolyn (OPTICROM) 4 % ophthalmic solution Place 1 drop into both eyes 4 (four) times daily. (Patient taking differently: Place 1 drop into both eyes 4 (four) times daily as needed.) 10 mL 1   cyclobenzaprine (FLEXERIL) 10 MG tablet Take 1-2 tablets (10-20 mg total) by mouth 3 (three) times daily as needed for muscle spasms. 60 tablet 0   diphenoxylate-atropine (LOMOTIL) 2.5-0.025 MG tablet Take 1 tablet by mouth 2 (two) times daily as needed for diarrhea or loose stools. (Patient not taking: Reported on 08/22/2022) 60 tablet 2   ferrous sulfate 324 (65 Fe) MG TBEC TAKE 1 TABLET(324 MG) BY MOUTH IN THE MORNING AND AT BEDTIME 180 tablet 1   fluticasone (FLONASE) 50 MCG/ACT nasal spray SHAKE LIQUID AND USE 2 SPRAYS IN EACH NOSTRIL EVERY DAY 48 g 0   glucose 4 GM chewable tablet Chew 1 tablet (4 g total) by mouth as needed for low blood sugar. 50 tablet 1   halobetasol (ULTRAVATE) 0.05 % cream Apply 1 Application topically daily as needed.     losartan (COZAAR) 100 MG tablet Take 1 tablet daily 90 tablet 0   meclizine (ANTIVERT) 25 MG tablet Take 1 tablet (25 mg total) by mouth 3 (three) times daily as needed for dizziness. 30 tablet 0   mesalamine (LIALDA) 1.2 g EC tablet TAKE 4  TABLETS(4.8 GRAMS) BY MOUTH DAILY 360 tablet 3   ondansetron (ZOFRAN) 8 MG tablet Take 1 tablet (  8 mg total) by mouth every 8 (eight) hours as needed for nausea or vomiting. 20 tablet 0   ORENCIA CLICKJECT 125 MG/ML SOAJ Inject 1 Syringe into the skin once a week.     pantoprazole (PROTONIX) 40 MG tablet Take 1 tablet (40 mg total) by mouth daily. Please schedule an appt with Dr. Jimmey Ralph for further refills [phone] 90 tablet 0   Potassium 95 MG TABS Take 1 tablet by mouth daily at 6 (six) AM.     promethazine (PHENERGAN) 25 MG tablet 1 tablet as needed     sertraline (ZOLOFT) 100 MG tablet Take 2 tablets (200 mg total) by mouth daily. (Patient taking differently: Take 150 mg by mouth daily.) 180 tablet 3   Sodium Sulfate-Mag Sulfate-KCl (SUTAB) [phone] MG TABS Take 1 tablet by mouth as directed. BIN: [id number]  PCN: 2001  GROUP: [id number]  MEMBER ID: [id number] 24 tablet 0   topiramate (TOPAMAX) 100 MG tablet TAKE 1 TABLET(100 MG) BY MOUTH TWICE DAILY. NEED APPOINTMENT FOR MORE REFILLS (Patient taking differently: Take 100 mg by mouth daily. TAKE 1 TABLET(100 MG) BY MOUTH TWICE DAILY. NEED APPOINTMENT FOR MORE REFILLS) 60 tablet 0   valACYclovir (VALTREX) 1000 MG tablet Take 2 tablets (2,000 mg total) by mouth as needed. For two doses for cold sores. 20 tablet 0   Vitamin D, Ergocalciferol, (DRISDOL) 1.25 MG (50000 UNIT) CAPS capsule Take 1 capsule (50,000 Units total) by mouth every 7 (seven) days. 12 capsule 3   zinc gluconate 50 MG tablet Take 50 mg by mouth daily.     No current facility-administered medications for this visit.    PHYSICAL EXAMINATION:  ECOG PERFORMANCE STATUS: 1 - Symptomatic but completely ambulatory   There were no vitals filed for this visit.   There were no vitals filed for this visit.    Physical Exam Vitals and nursing note reviewed.  Constitutional:      General: She is not in acute distress.    Appearance: Normal appearance. She is obese. She is  not ill-appearing, toxic-appearing or diaphoretic.     Comments: Here alone  HENT:     Head: Normocephalic and atraumatic.     Right Ear: External ear normal.     Left Ear: External ear normal.     Nose: Nose normal. No congestion or rhinorrhea.  Eyes:     General: No scleral icterus.    Extraocular Movements: Extraocular movements intact.     Conjunctiva/sclera: Conjunctivae normal.     Pupils: Pupils are equal, round, and reactive to light.  Cardiovascular:     Rate and Rhythm: Normal rate and regular rhythm.     Heart sounds: Normal heart sounds. No murmur heard.    No friction rub. No gallop.  Pulmonary:     Effort: Pulmonary effort is normal. No respiratory distress.     Breath sounds: Normal breath sounds. No stridor. No wheezing or rhonchi.  Abdominal:     General: Bowel sounds are normal.     Palpations: Abdomen is soft.     Tenderness: There is no abdominal tenderness. There is no guarding or rebound.  Musculoskeletal:        General: No swelling, tenderness or deformity.     Cervical back: Normal range of motion and neck supple. No rigidity or tenderness.  Lymphadenopathy:     Head:     Right side of head: No submental, submandibular, tonsillar, preauricular, posterior auricular or occipital adenopathy.     Left side of  head: No submental, submandibular, tonsillar, preauricular, posterior auricular or occipital adenopathy.     Cervical: No cervical adenopathy.     Right cervical: No superficial, deep or posterior cervical adenopathy.    Left cervical: No superficial, deep or posterior cervical adenopathy.     Upper Body:     Right upper body: No supraclavicular, axillary, pectoral or epitrochlear adenopathy.     Left upper body: No supraclavicular, axillary, pectoral or epitrochlear adenopathy.  Skin:    General: Skin is warm.     Coloration: Skin is not jaundiced.     Comments: Follicular rash on extensor surfaces of arms. Firm subcutaneous, mobile nodules on dorsum  of forearms   Neurological:     General: No focal deficit present.     Mental Status: She is alert and oriented to person, place, and time.     Cranial Nerves: No cranial nerve deficit.  Psychiatric:        Mood and Affect: Mood normal.        Behavior: Behavior normal.        Thought Content: Thought content normal.        Judgment: Judgment normal.     LABORATORY DATA: I have personally reviewed the data as listed:  No visits with results within 1 Month(s) from this visit.  Latest known visit with results is:  Office Visit on 05/22/2022  Component Date Value Ref Range Status   Neisseria Gonorrhea 05/22/2022 Negative   Final   Chlamydia 05/22/2022 Negative   Final   Trichomonas 05/22/2022 Negative   Final   Comment 05/22/2022 Normal Reference Range Trichomonas - Negative   Final   Comment 05/22/2022 Normal Reference Ranger Chlamydia - Negative   Final   Comment 05/22/2022 Normal Reference Range Neisseria Gonorrhea - Negative   Final   HIV 1&2 Ab, 4th Generation 05/22/2022 NON-REACTIVE  NON-REACTIVE Final   Comment: HIV-1 antigen and HIV-1/HIV-2 antibodies were not detected. There is no laboratory evidence of HIV infection. [name] PLEASE NOTE: This information has been disclosed to you from records whose confidentiality may be protected by state law.  If your state requires such protection, then the state law prohibits you from making any further disclosure of the information without the specific written consent of the person to whom it pertains, or as otherwise permitted by law. A general authorization for the release of medical or other information is NOT sufficient for this purpose. . For additional information please refer to http://education.questdiagnostics.com/faq/FAQ106 (This link is being provided for informational/ educational purposes only.) . [name] The performance of this assay has not been clinically validated in patients less than 28 years old. .    RPR Ser Ql  05/22/2022 NON-REACTIVE  NON-REACTIVE Final   Comment: . No laboratory evidence of syphilis. If recent exposure is suspected, submit a new sample in 2-4 weeks. .    Hepatitis C Ab 05/22/2022 NON-REACTIVE  NON-REACTIVE Final   Comment: . HCV antibody was non-reactive. There is no laboratory  evidence of HCV infection. . In most cases, no further action is required. However, if recent HCV exposure is suspected, a test for HCV RNA (test code 19147) is suggested. . For additional information please refer to http://education.quest[facility].com/faq/FAQ22v1 (This link is being provided for informational/ educational purposes only.) .     RADIOGRAPHIC STUDIES: I have personally reviewed the radiological images as listed and agree with the findings in the report  No results found.  ASSESSMENT/PLAN  52 y.o. female with thrombocytopenia Medical history is  notable for inflammatory arthritis (lupus, psoriatic arthritis),  back pain, endometriosis, fatty liver, fibromyalgia,  sessile serrated colon polyps  Thrombocytopenia:  Mild and without bleeding complications.  Most likely causes in this patient are autoimmune syndromes, inflammation, splenomegaly, hepatic steatosis.   Sep 06 2022- Has mild gingival bleeding more likely related to xerostomia from Sjogren's than thrombocytopenia.  Can therefore hold on tranexamic acid or amicar mouthwash  Iron deficiency:  Likely secondary to chronic GI blood loss from ulcerative colitis January 16, 2022: EGD  The esophagus was normal.  A few small inflammatory nodules in the antrum. The stomach was otherwise  normal.  Duodenum was normal. Colonoscopy  The terminal ileum appeared normal. Colon with patchy erythema throughout. Scarring in the region of the hepatic flexure,  again noted. Colon otherwise normal  Sep 06 2022- Tolerating oral iron.  Ferritin adequate.    Skin nodules:  April 05 2022:  U/S of skin nodules Complex cystic appearing  lesions in the subcutaneous fat potentially some type of complex ganglion cyst. MRI without and with contrast suggested for further evaluation.   Sep 06 2022- Since otherwise asymptomatic recommended against further evaluation at this time   Cancer Staging  No matching staging information was found for the patient.   No problem-specific Assessment & Plan notes found for this encounter.   No orders of the defined types were placed in this encounter.   30  minutes was spent in patient care.  This included time spent preparing to see the patient (e.g., review of tests), obtaining and/or reviewing separately obtained history, counseling and educating the patient, ordering medications, tests/ procedures; documenting clinical information in the electronic or other health record, independently interpreting results and communicating results to the patient as well as coordination of care.        All questions were answered. The patient knows to call the clinic with any problems, questions or concerns.  This note was electronically signed.    Loni Muse, MD  09/05/2022 12:02 PM

## 2022-09-06 ENCOUNTER — Inpatient Hospital Stay: Payer: Commercial Managed Care - PPO | Attending: Oncology

## 2022-09-06 ENCOUNTER — Inpatient Hospital Stay: Payer: Commercial Managed Care - PPO | Admitting: Oncology

## 2022-09-06 VITALS — BP 138/72 | HR 80 | Temp 98.2°F | Resp 18 | Ht 64.0 in | Wt 248.6 lb

## 2022-09-06 DIAGNOSIS — Z8719 Personal history of other diseases of the digestive system: Secondary | ICD-10-CM | POA: Diagnosis not present

## 2022-09-06 DIAGNOSIS — Z79899 Other long term (current) drug therapy: Secondary | ICD-10-CM | POA: Diagnosis not present

## 2022-09-06 DIAGNOSIS — Z90721 Acquired absence of ovaries, unilateral: Secondary | ICD-10-CM | POA: Diagnosis not present

## 2022-09-06 DIAGNOSIS — K219 Gastro-esophageal reflux disease without esophagitis: Secondary | ICD-10-CM | POA: Diagnosis not present

## 2022-09-06 DIAGNOSIS — E785 Hyperlipidemia, unspecified: Secondary | ICD-10-CM | POA: Diagnosis not present

## 2022-09-06 DIAGNOSIS — I10 Essential (primary) hypertension: Secondary | ICD-10-CM | POA: Diagnosis not present

## 2022-09-06 DIAGNOSIS — K760 Fatty (change of) liver, not elsewhere classified: Secondary | ICD-10-CM | POA: Insufficient documentation

## 2022-09-06 DIAGNOSIS — Z833 Family history of diabetes mellitus: Secondary | ICD-10-CM | POA: Insufficient documentation

## 2022-09-06 DIAGNOSIS — K51919 Ulcerative colitis, unspecified with unspecified complications: Secondary | ICD-10-CM

## 2022-09-06 DIAGNOSIS — M3500 Sicca syndrome, unspecified: Secondary | ICD-10-CM

## 2022-09-06 DIAGNOSIS — R229 Localized swelling, mass and lump, unspecified: Secondary | ICD-10-CM | POA: Diagnosis not present

## 2022-09-06 DIAGNOSIS — Z880 Allergy status to penicillin: Secondary | ICD-10-CM | POA: Insufficient documentation

## 2022-09-06 DIAGNOSIS — L4050 Arthropathic psoriasis, unspecified: Secondary | ICD-10-CM | POA: Diagnosis not present

## 2022-09-06 DIAGNOSIS — Z881 Allergy status to other antibiotic agents status: Secondary | ICD-10-CM | POA: Diagnosis not present

## 2022-09-06 DIAGNOSIS — R21 Rash and other nonspecific skin eruption: Secondary | ICD-10-CM | POA: Diagnosis not present

## 2022-09-06 DIAGNOSIS — D696 Thrombocytopenia, unspecified: Secondary | ICD-10-CM

## 2022-09-06 DIAGNOSIS — Z800 Family history of malignant neoplasm of digestive organs: Secondary | ICD-10-CM | POA: Insufficient documentation

## 2022-09-06 DIAGNOSIS — K5190 Ulcerative colitis, unspecified, without complications: Secondary | ICD-10-CM | POA: Diagnosis not present

## 2022-09-06 DIAGNOSIS — Z801 Family history of malignant neoplasm of trachea, bronchus and lung: Secondary | ICD-10-CM | POA: Insufficient documentation

## 2022-09-06 DIAGNOSIS — M797 Fibromyalgia: Secondary | ICD-10-CM | POA: Insufficient documentation

## 2022-09-06 DIAGNOSIS — Z888 Allergy status to other drugs, medicaments and biological substances status: Secondary | ICD-10-CM | POA: Diagnosis not present

## 2022-09-06 DIAGNOSIS — D509 Iron deficiency anemia, unspecified: Secondary | ICD-10-CM

## 2022-09-06 DIAGNOSIS — Z87891 Personal history of nicotine dependence: Secondary | ICD-10-CM | POA: Insufficient documentation

## 2022-09-06 DIAGNOSIS — Z882 Allergy status to sulfonamides status: Secondary | ICD-10-CM | POA: Insufficient documentation

## 2022-09-06 DIAGNOSIS — Z886 Allergy status to analgesic agent status: Secondary | ICD-10-CM | POA: Insufficient documentation

## 2022-09-06 DIAGNOSIS — E611 Iron deficiency: Secondary | ICD-10-CM | POA: Insufficient documentation

## 2022-09-06 DIAGNOSIS — K068 Other specified disorders of gingiva and edentulous alveolar ridge: Secondary | ICD-10-CM | POA: Diagnosis not present

## 2022-09-06 DIAGNOSIS — R161 Splenomegaly, not elsewhere classified: Secondary | ICD-10-CM

## 2022-09-06 DIAGNOSIS — Z885 Allergy status to narcotic agent status: Secondary | ICD-10-CM | POA: Diagnosis not present

## 2022-09-06 DIAGNOSIS — Z83719 Family history of colon polyps, unspecified: Secondary | ICD-10-CM | POA: Insufficient documentation

## 2022-09-06 DIAGNOSIS — Z8349 Family history of other endocrine, nutritional and metabolic diseases: Secondary | ICD-10-CM | POA: Insufficient documentation

## 2022-09-06 DIAGNOSIS — F419 Anxiety disorder, unspecified: Secondary | ICD-10-CM | POA: Diagnosis not present

## 2022-09-06 DIAGNOSIS — Z811 Family history of alcohol abuse and dependence: Secondary | ICD-10-CM | POA: Insufficient documentation

## 2022-09-06 DIAGNOSIS — Z8249 Family history of ischemic heart disease and other diseases of the circulatory system: Secondary | ICD-10-CM | POA: Insufficient documentation

## 2022-09-06 DIAGNOSIS — Z8379 Family history of other diseases of the digestive system: Secondary | ICD-10-CM | POA: Insufficient documentation

## 2022-09-06 DIAGNOSIS — L905 Scar conditions and fibrosis of skin: Secondary | ICD-10-CM | POA: Diagnosis not present

## 2022-09-06 LAB — FERRITIN: Ferritin: 84 ng/mL (ref 11–307)

## 2022-09-10 ENCOUNTER — Encounter: Payer: Self-pay | Admitting: Oncology

## 2022-09-13 ENCOUNTER — Encounter (INDEPENDENT_AMBULATORY_CARE_PROVIDER_SITE_OTHER): Payer: Commercial Managed Care - PPO | Admitting: Ophthalmology

## 2022-09-13 DIAGNOSIS — H2513 Age-related nuclear cataract, bilateral: Secondary | ICD-10-CM

## 2022-09-13 DIAGNOSIS — H43813 Vitreous degeneration, bilateral: Secondary | ICD-10-CM

## 2022-09-13 DIAGNOSIS — I10 Essential (primary) hypertension: Secondary | ICD-10-CM

## 2022-09-13 DIAGNOSIS — H35033 Hypertensive retinopathy, bilateral: Secondary | ICD-10-CM | POA: Diagnosis not present

## 2022-09-13 DIAGNOSIS — H353221 Exudative age-related macular degeneration, left eye, with active choroidal neovascularization: Secondary | ICD-10-CM | POA: Diagnosis not present

## 2022-09-20 ENCOUNTER — Encounter: Payer: Self-pay | Admitting: Internal Medicine

## 2022-09-20 ENCOUNTER — Encounter: Payer: Self-pay | Admitting: Family Medicine

## 2022-09-20 ENCOUNTER — Ambulatory Visit (INDEPENDENT_AMBULATORY_CARE_PROVIDER_SITE_OTHER): Payer: Commercial Managed Care - PPO | Admitting: Family Medicine

## 2022-09-20 VITALS — BP 104/99 | HR 88 | Temp 98.0°F | Ht 64.0 in | Wt 245.6 lb

## 2022-09-20 DIAGNOSIS — E538 Deficiency of other specified B group vitamins: Secondary | ICD-10-CM | POA: Diagnosis not present

## 2022-09-20 DIAGNOSIS — K51919 Ulcerative colitis, unspecified with unspecified complications: Secondary | ICD-10-CM

## 2022-09-20 DIAGNOSIS — R7303 Prediabetes: Secondary | ICD-10-CM

## 2022-09-20 DIAGNOSIS — Z0000 Encounter for general adult medical examination without abnormal findings: Secondary | ICD-10-CM

## 2022-09-20 DIAGNOSIS — Z0001 Encounter for general adult medical examination with abnormal findings: Secondary | ICD-10-CM

## 2022-09-20 DIAGNOSIS — J3489 Other specified disorders of nose and nasal sinuses: Secondary | ICD-10-CM

## 2022-09-20 DIAGNOSIS — M545 Low back pain, unspecified: Secondary | ICD-10-CM

## 2022-09-20 DIAGNOSIS — B001 Herpesviral vesicular dermatitis: Secondary | ICD-10-CM

## 2022-09-20 DIAGNOSIS — I10 Essential (primary) hypertension: Secondary | ICD-10-CM

## 2022-09-20 DIAGNOSIS — F321 Major depressive disorder, single episode, moderate: Secondary | ICD-10-CM

## 2022-09-20 DIAGNOSIS — D509 Iron deficiency anemia, unspecified: Secondary | ICD-10-CM

## 2022-09-20 DIAGNOSIS — K219 Gastro-esophageal reflux disease without esophagitis: Secondary | ICD-10-CM

## 2022-09-20 DIAGNOSIS — L4050 Arthropathic psoriasis, unspecified: Secondary | ICD-10-CM

## 2022-09-20 DIAGNOSIS — E559 Vitamin D deficiency, unspecified: Secondary | ICD-10-CM

## 2022-09-20 DIAGNOSIS — F419 Anxiety disorder, unspecified: Secondary | ICD-10-CM

## 2022-09-20 DIAGNOSIS — M3219 Other organ or system involvement in systemic lupus erythematosus: Secondary | ICD-10-CM

## 2022-09-20 DIAGNOSIS — M3500 Sicca syndrome, unspecified: Secondary | ICD-10-CM

## 2022-09-20 DIAGNOSIS — E785 Hyperlipidemia, unspecified: Secondary | ICD-10-CM | POA: Diagnosis not present

## 2022-09-20 LAB — LDL CHOLESTEROL, DIRECT: Direct LDL: 106 mg/dL

## 2022-09-20 LAB — LIPID PANEL
Cholesterol: 190 mg/dL (ref 0–200)
HDL: 30.1 mg/dL — ABNORMAL LOW (ref >39.00)
Total CHOL/HDL Ratio: 6
Triglycerides: 468 mg/dL — ABNORMAL HIGH (ref 0.0–149.0)

## 2022-09-20 LAB — VITAMIN D 25 HYDROXY (VIT D DEFICIENCY, FRACTURES): VITD: 46.84 ng/mL (ref 30.00–100.00)

## 2022-09-20 LAB — VITAMIN B12: Vitamin B-12: 711 pg/mL (ref 211–911)

## 2022-09-20 LAB — TSH: TSH: 3.25 u[IU]/mL (ref 0.35–5.50)

## 2022-09-20 LAB — HEMOGLOBIN A1C: Hgb A1c MFr Bld: 5.9 % (ref 4.6–6.5)

## 2022-09-20 MED ORDER — SERTRALINE HCL 100 MG PO TABS: 150.0000 mg | ORAL_TABLET | Freq: Every day | ORAL | 3 refills | Status: DC

## 2022-09-20 MED ORDER — VALACYCLOVIR HCL 1 G PO TABS
2000.0000 mg | ORAL_TABLET | ORAL | 5 refills | Status: AC | PRN
Start: 2022-09-20 — End: ?

## 2022-09-20 MED ORDER — PANTOPRAZOLE SODIUM 40 MG PO TBEC: 40.0000 mg | DELAYED_RELEASE_TABLET | Freq: Every day | ORAL | 3 refills | Status: DC

## 2022-09-20 MED ORDER — ONDANSETRON HCL 8 MG PO TABS: 8.0000 mg | ORAL_TABLET | Freq: Three times a day (TID) | ORAL | 0 refills | Status: AC | PRN

## 2022-09-20 MED ORDER — TOPIRAMATE 100 MG PO TABS: 100.0000 mg | ORAL_TABLET | Freq: Two times a day (BID) | ORAL | 3 refills | Status: DC

## 2022-09-20 MED ORDER — VITAMIN D (ERGOCALCIFEROL) 1.25 MG (50000 UNIT) PO CAPS: 50000.0000 [IU] | ORAL_CAPSULE | ORAL | 3 refills | Status: DC

## 2022-09-20 MED ORDER — CLONAZEPAM 1 MG PO TABS
1.0000 mg | ORAL_TABLET | Freq: Three times a day (TID) | ORAL | 1 refills | Status: DC | PRN
Start: 2022-09-20 — End: 2023-04-02

## 2022-09-20 MED ORDER — CELECOXIB 200 MG PO CAPS: 200.0000 mg | ORAL_CAPSULE | Freq: Two times a day (BID) | ORAL | 3 refills | Status: DC

## 2022-09-20 MED ORDER — FLUTICASONE PROPIONATE 50 MCG/ACT NA SUSP: 2.0000 | Freq: Every day | NASAL | 0 refills | Status: DC

## 2022-09-20 NOTE — Assessment & Plan Note (Signed)
Check A1c.  Discussed lifestyle modifications. °

## 2022-09-20 NOTE — Assessment & Plan Note (Signed)
She has had mild recurrence of pain recently.  Attributes this due to having to sit in a chair for long periods of time due to her work.  She uses Celebrex as needed.  Will refill today.  No red flags.  Would consider referral to orthopedics if this continues to be an issue though her underlying rheumatologic issues are also likely contributing to this and hopefully will have some improvement with treatment for her underlying autoimmune disorders.

## 2022-09-20 NOTE — Assessment & Plan Note (Signed)
Continue losartan 100 mg daily.  Mildly elevated diastolic reading this morning however she has typically been at goal with this regimen.  She will monitor at home and let us know if persistently elevated.

## 2022-09-20 NOTE — Assessment & Plan Note (Signed)
She is not currently taking vitamin D supplementation that would like to restart 50,000 IUs once weekly.  We will check labs today.  Will refill her vitamin D prescription.

## 2022-09-20 NOTE — Assessment & Plan Note (Signed)
Currently on left 150 mg daily and clonazepam 1 mg 3 times daily as needed.  She has moved to a new location which has helped with her anxiety levels.  She is comfortable with her current medication regimen.  Will refill today.

## 2022-09-20 NOTE — Assessment & Plan Note (Signed)
Check labs.  She is trying to do the best she can with exercise however this is limited due to her rheumatologic issues.  She is working on eating healthy with plenty of fruits and veggies.

## 2022-09-20 NOTE — Assessment & Plan Note (Signed)
Protonix 40 mg daily.  Tolerating well.  Check B12.

## 2022-09-20 NOTE — Progress Notes (Signed)
Chief Complaint:  Melinda Porter is a 52 y.o. female who presents today for her annual comprehensive physical exam.    Assessment/Plan:  Chronic Problems Addressed Today: Hyperlipemia Check labs.  She is trying to do the best she can with exercise however this is limited due to her rheumatologic issues.  She is working on eating healthy with plenty of fruits and veggies.  Anxiety Currently on left 150 mg daily and clonazepam 1 mg 3 times daily as needed.  She has moved to a new location which has helped with her anxiety levels.  She is comfortable with her current medication regimen.  Will refill today.  GERD (gastroesophageal reflux disease) Protonix 40 mg daily.  Tolerating well.  Check B12.  Prediabetes Check A1c.  Discussed lifestyle modifications.  Vitamin D deficiency She is not currently taking vitamin D supplementation that would like to restart 50,000 IUs once weekly.  We will check labs today.  Will refill her vitamin D prescription.  Psoriatic arthritis (HCC) Pain is not controlled.  She has also developed several nodules on her extremities which are rheumatologist thinks may be due to her underlying psoriatic arthritis.  She is interested in being referred to a tertiary care center at this point.  Will place referral.  Will also refill her Celebrex.  She uses this sparingly as needed.  She is aware not to take with other NSAIDs.  Systemic lupus erythematosus (HCC) See above.  Following with dermatology and rheumatology.  She wishes to be referred to tertiary care center at this point as symptoms do not seem to be adequately controlled.  Will place referral today.  Ulcerative colitis (HCC) Follows with GI.  On lialda 4.8g daily.   Sjogren's syndrome (HCC) Overall symptoms are stable.  She is trying to avoid antihistamines or other medications that may exacerbate this.  Will be referring to rheumatology as above.  Vitamin B12 deficiency Check  B12.  Hypertension Continue losartan 100 mg daily.  Mildly elevated diastolic reading this morning however she has typically been at goal with this regimen.  She will monitor at home and let us know if persistently elevated.  Iron deficiency anemia Following with oncology for this.  Her recent iron panel was at goal.  On iron supplementation.  Depression, major, single episode, moderate (HCC) Stable on Zoloft 150 mg daily.  Will refill today.  Recurrent low back pain She has had mild recurrence of pain recently.  Attributes this due to having to sit in a chair for long periods of time due to her work.  She uses Celebrex as needed.  Will refill today.  No red flags.  Would consider referral to orthopedics if this continues to be an issue though her underlying rheumatologic issues are also likely contributing to this and hopefully will have some improvement with treatment for her underlying autoimmune disorders.  Preventative Healthcare: Check labs. UTd on vaccine on screenings.   Patient Counseling(The following topics were reviewed and/or handout was given):  -Nutrition: Stressed importance of moderation in sodium/caffeine intake, saturated fat and cholesterol, caloric balance, sufficient intake of fresh fruits, vegetables, and fiber.  -Stressed the importance of regular exercise.   -Substance Abuse: Discussed cessation/primary prevention of tobacco, alcohol, or other drug use; driving or other dangerous activities under the influence; availability of treatment for abuse.   -Injury prevention: Discussed safety belts, safety helmets, smoke detector, smoking near bedding or upholstery.   -Sexuality: Discussed sexually transmitted diseases, partner selection, use of condoms, avoidance of unintended  pregnancy and contraceptive alternatives.   -Dental health: Discussed importance of regular tooth brushing, flossing, and dental visits.  -Health maintenance and immunizations reviewed. Please refer  to Health maintenance section.  Return to care in 1 year for next preventative visit.     Subjective:  HPI:  See Assessment / plan for status of chronic conditions. No new concerns today.   Lifestyle Diet: None specific.  Exercise: Limited.      09/20/2022    9:12 AM  Depression screen PHQ 2/9  Decreased Interest 0  Down, Depressed, Hopeless 0  PHQ - 2 Score 0    There are no preventive care reminders to display for this patient.   ROS: Per HPI, otherwise a complete review of systems was negative.   PMH:  The following were reviewed and entered/updated in epic: Past Medical History:  Diagnosis Date   Anemia    Anxiety    Arthritis    Back pain    Bilateral swelling of feet    Cataract    not a surgical candidate at this time (08/22/2022)   Depression    Endometriosis    Fatty liver    Fibromyalgia    GERD (gastroesophageal reflux disease)    Hiatal hernia    Hyperlipemia    Hypertension    IBS (irritable bowel syndrome)    Joint pain    Lupus (HCC)    Macular degeneration of both eyes    PONV (postoperative nausea and vomiting)    Psoriatic arthritis (HCC)    PTSD (post-traumatic stress disorder)    Seasonal allergies    Sjogren's disease (HCC)    Sleep-disordered breathing 11/12/2021   UC (ulcerative colitis) (HCC)    Vitamin D deficiency    Patient Active Problem List   Diagnosis Date Noted   Recurrent low back pain 09/20/2022   Hepatic steatosis 04/24/2022   Multiple skin nodules 04/24/2022   Thrombocytopenia (HCC) 04/05/2022   Splenomegaly 04/05/2022   Sleep-disordered breathing 11/12/2021   Elevated lipase 10/23/2021   Prediabetes 01/01/2021   Morbid obesity (HCC) 07/10/2020   Depression, major, single episode, moderate (HCC) 05/29/2020   Anxiety 05/29/2020   PTSD (post-traumatic stress disorder) 05/29/2020   Vitamin D deficiency 05/29/2020   Iron deficiency anemia 05/29/2020   GERD (gastroesophageal reflux disease) 05/29/2020    Sjogren's syndrome (HCC) 05/29/2020   Seasonal allergies 05/29/2020   Vitamin B12 deficiency 05/29/2020   Cold sore 05/29/2020   Systemic lupus erythematosus (HCC) 12/29/2019   Ulcerative colitis (HCC) 01/05/2017   Persistent headaches 11/05/2014   Facial droop 11/05/2014   Syncope 11/05/2014   Hypertension 11/05/2014   Hyperlipemia 11/05/2014   Psoriatic arthritis (HCC) 11/05/2014   Past Surgical History:  Procedure Laterality Date   CESAREAN SECTION  1997   COLONOSCOPY  2021   JP-MAC-suprep(exc)-patchy erythema otherwise normal - recall 5 years   FLEXIBLE SIGMOIDOSCOPY N/A 01/06/2017   Procedure: FLEXIBLE SIGMOIDOSCOPY;  Surgeon: Benancio Deeds, MD;  Location: [facility];  Service: Gastroenterology;  Laterality: N/A;   OOPHORECTOMY Bilateral    PARTIAL HYSTERECTOMY  07/2009   TEMPOROMANDIBULAR JOINT SURGERY Right 2009   TONSILLECTOMY  2009    Family History  Problem Relation Age of Onset   Colon polyps Maternal Grandmother    Lung cancer Maternal Grandmother    Liver disease Father        agent orange   Alcoholism Father    Thyroid disease Mother    Diabetes Maternal Grandfather    Heart disease Maternal  Grandfather    Hypertension Maternal Grandfather    Colon cancer Maternal Uncle        great uncle   Breast cancer Neg Hx    Esophageal cancer Neg Hx    Stomach cancer Neg Hx    Rectal cancer Neg Hx     Medications- reviewed and updated Current Outpatient Medications  Medication Sig Dispense Refill   Apremilast 30 MG TABS Take 1 tablet by mouth in the morning and at bedtime.     Ascorbic Acid (VITA-C PO) Take 3 tablets by mouth daily at 6 (six) AM. 3 gummies a day     celecoxib (CELEBREX) 200 MG capsule Take 1 capsule (200 mg total) by mouth 2 (two) times daily. 60 capsule 3   cromolyn (OPTICROM) 4 % ophthalmic solution Place 1 drop into both eyes 4 (four) times daily. (Patient taking differently: Place 1 drop into both eyes 4 (four) times daily as  needed.) 10 mL 1   cyclobenzaprine (FLEXERIL) 10 MG tablet Take 1-2 tablets (10-20 mg total) by mouth 3 (three) times daily as needed for muscle spasms. 60 tablet 0   diphenoxylate-atropine (LOMOTIL) 2.5-0.025 MG tablet Take 1 tablet by mouth 2 (two) times daily as needed for diarrhea or loose stools. 60 tablet 2   ferrous sulfate 324 (65 Fe) MG TBEC TAKE 1 TABLET(324 MG) BY MOUTH IN THE MORNING AND AT BEDTIME 180 tablet 1   glucose 4 GM chewable tablet Chew 1 tablet (4 g total) by mouth as needed for low blood sugar. 50 tablet 1   halobetasol (ULTRAVATE) 0.05 % cream Apply 1 Application topically daily as needed.     losartan (COZAAR) 100 MG tablet Take 1 tablet daily 90 tablet 0   meclizine (ANTIVERT) 25 MG tablet Take 1 tablet (25 mg total) by mouth 3 (three) times daily as needed for dizziness. 30 tablet 0   mesalamine (LIALDA) 1.2 g EC tablet TAKE 4 TABLETS(4.8 GRAMS) BY MOUTH DAILY 360 tablet 3   ORENCIA CLICKJECT 125 MG/ML SOAJ Inject 1 Syringe into the skin once a week.     Potassium 95 MG TABS Take 1 tablet by mouth daily at 6 (six) AM.     promethazine (PHENERGAN) 25 MG tablet 1 tablet as needed     Sodium Sulfate-Mag Sulfate-KCl (SUTAB) [phone] MG TABS Take 1 tablet by mouth as directed. BIN: [id number]  PCN: [id number]  GROUP: [id number]  MEMBER ID: [id number] 24 tablet 0   zinc gluconate 50 MG tablet Take 50 mg by mouth daily.     clonazePAM (KLONOPIN) 1 MG tablet Take 1 tablet (1 mg total) by mouth 3 (three) times daily as needed for anxiety. 270 tablet 1   fluticasone (FLONASE) 50 MCG/ACT nasal spray Place 2 sprays into both nostrils daily. 48 g 0   ondansetron (ZOFRAN) 8 MG tablet Take 1 tablet (8 mg total) by mouth every 8 (eight) hours as needed for nausea or vomiting. 20 tablet 0   pantoprazole (PROTONIX) 40 MG tablet Take 1 tablet (40 mg total) by mouth daily. 90 tablet 3   sertraline (ZOLOFT) 100 MG tablet Take 1.5 tablets (150 mg total) by mouth daily. 135 tablet 3   topiramate  (TOPAMAX) 100 MG tablet Take 1 tablet (100 mg total) by mouth 2 (two) times daily. TAKE 1 TABLET(100 MG) BY MOUTH TWICE DAILY. 180 tablet 3   valACYclovir (VALTREX) 1000 MG tablet Take 2 tablets (2,000 mg total) by mouth as needed. For two doses for cold sores.  20 tablet 5   Vitamin D, Ergocalciferol, (DRISDOL) 1.25 MG (50000 UNIT) CAPS capsule Take 1 capsule (50,000 Units total) by mouth every 7 (seven) days. 12 capsule 3   No current facility-administered medications for this visit.    Allergies-reviewed and updated Allergies  Allergen Reactions   Lyrica [Pregabalin] Shortness Of Breath   Sulfa Antibiotics Rash and Other (See Comments)   Ciprofloxacin Nausea Only   Gabapentin Other (See Comments)    Stroke like symptoms   Morphine Sulfate     Other reaction(s): Unknown   Nsaids Other (See Comments)    Liver problems- can't take tylenol, ibuprofen etc.   Prednisone Other (See Comments)    "I get deathly ill"   Remicade [Infliximab]    Tolmetin     Other reaction(s): Other (See Comments) Liver problems- can't take tylenol, ibuprofen etc.   Codeine Rash    Other reaction(s): Unknown   Humira [Adalimumab] Rash    Worsening of Lupus rash.   Morphine And Codeine Rash    "Wired up"   Penicillins Rash    Has patient had a PCN reaction causing immediate rash, facial/tongue/throat swelling, SOB or lightheadedness with hypotension: NO Has patient had a PCN reaction causing severe rash involving mucus membranes or skin necrosis: NO Has patient had a PCN reaction that required hospitalization NO Has patient had a PCN reaction occurring within the last 10 years: NO If all of the above answers are "NO", then may proceed with Cephalosporin use.    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   Social Determinants of Health   Financial Resource Strain: Not on file  Food Insecurity: Not on file  Transportation Needs: Not on file  Physical Activity: Not on file  Stress: Not on file  Social Connections: Not on file        Objective:  Physical Exam: BP (!) 104/99   Pulse 88   Temp 98 F (36.7 C) (Temporal)   Ht 5\' 4"  (1.626 m)   Wt 245 lb 9.6 oz (111.4 kg)   LMP 06/23/2009   SpO2 95%   BMI 42.16 kg/m   Body mass index is 42.16 kg/m. Wt Readings from Last 3 Encounters:  09/20/22 245 lb 9.6 oz (111.4 kg)  09/06/22 248 lb 9.6 oz (112.8 kg)  08/22/22 240 lb (108.9 kg)   Gen: NAD, resting comfortably HEENT: TMs normal bilaterally. OP clear. No thyromegaly noted.  CV: RRR with no murmurs appreciated Pulm: NWOB, CTAB with no crackles, wheezes, or rhonchi GI: Normal bowel sounds present. Soft, Nontender, Nondistended. MSK: no edema, cyanosis, or clubbing noted Skin: warm, dry Neuro: CN2-12 grossly intact. Strength 5/5 in upper and lower extremities. Reflexes  symmetric and intact bilaterally.  Psych: Normal affect and thought content     Abrina Petz M. Jimmey Ralph, MD 09/20/2022 9:44 AM

## 2022-09-20 NOTE — Assessment & Plan Note (Signed)
Check B12 

## 2022-09-20 NOTE — Assessment & Plan Note (Signed)
Following with oncology for this.  Her recent iron panel was at goal.  On iron supplementation.

## 2022-09-20 NOTE — Assessment & Plan Note (Signed)
Stable on Zoloft 150 mg daily.  Will refill today. 

## 2022-09-20 NOTE — Assessment & Plan Note (Signed)
Overall symptoms are stable.  She is trying to avoid antihistamines or other medications that may exacerbate this.  Will be referring to rheumatology as above.

## 2022-09-20 NOTE — Assessment & Plan Note (Signed)
See above.  Following with dermatology and rheumatology.  She wishes to be referred to tertiary care center at this point as symptoms do not seem to be adequately controlled.  Will place referral today.

## 2022-09-20 NOTE — Assessment & Plan Note (Signed)
Follows with GI.  On lialda 4.8g daily.

## 2022-09-20 NOTE — Patient Instructions (Addendum)
It was very nice to see you today!  We will check blood work today.  I will refill your medications today.   I will refer you to [facility] rheumatology.   Please continue to monitor your blood pressure and let us know if it is persistently elevated.  Return in about 1 year (around 09/20/2023) for Annual Physical.   Take care, Dr Jimmey Ralph  PLEASE NOTE:  If you had any lab tests, please let us know if you have not heard back within a few days. You may see your results on mychart before we have a chance to review them but we will give you a call once they are reviewed by [place].   If we ordered any referrals today, please let us know if you have not heard from their office within the next week.   If you had any urgent prescriptions sent in today, please check with the pharmacy within an hour of our visit to make sure the prescription was transmitted appropriately.   Please try these tips to maintain a healthy lifestyle:  Eat at least 3 REAL meals and 1-2 snacks per day.  Aim for no more than 5 hours between eating.  If you eat breakfast, please do so within one hour of getting up.   Each meal should contain half fruits/vegetables, one quarter protein, and one quarter carbs (no bigger than a computer mouse)  Cut down on sweet beverages. This includes juice, soda, and sweet tea.   Drink at least 1 glass of water with each meal and aim for at least 8 glasses per day  Exercise at least 150 minutes every week.    Preventive Care 28-21 Years Old, Female Preventive care refers to lifestyle choices and visits with your health care provider that can promote health and wellness. Preventive care visits are also called wellness exams. What can I expect for my preventive care visit? Counseling Your health care provider may ask you questions about your: Medical history, including: Past medical problems. Family medical history. Pregnancy history. Current health, including: Menstrual cycle. Method of  birth control. Emotional well-being. Home life and relationship well-being. Sexual activity and sexual health. Lifestyle, including: Alcohol, nicotine or tobacco, and drug use. Access to firearms. Diet, exercise, and sleep habits. Work and work Astronomer. Sunscreen use. Safety issues such as seatbelt and bike helmet use. Physical exam Your health care provider will check your: Height and weight. These may be used to calculate your BMI (body mass index). BMI is a measurement that tells if you are at a healthy weight. Waist circumference. This measures the distance around your waistline. This measurement also tells if you are at a healthy weight and may help predict your risk of certain diseases, such as type 2 diabetes and high blood pressure. Heart rate and blood pressure. Body temperature. Skin for abnormal spots. What immunizations do I need?  Vaccines are usually given at various ages, according to a schedule. Your health care provider will recommend vaccines for you based on your age, medical history, and lifestyle or other factors, such as travel or where you work. What tests do I need? Screening Your health care provider may recommend screening tests for certain conditions. This may include: Lipid and cholesterol levels. Diabetes screening. This is done by checking your blood sugar (glucose) after you have not eaten for a while (fasting). Pelvic exam and Pap test. Hepatitis B test. Hepatitis C test. HIV (human immunodeficiency virus) test. STI (sexually transmitted infection) testing, if you are  at risk. Lung cancer screening. Colorectal cancer screening. Mammogram. Talk with your health care provider about when you should start having regular mammograms. This may depend on whether you have a family history of breast cancer. BRCA-related cancer screening. This may be done if you have a family history of breast, ovarian, tubal, or peritoneal cancers. Bone density scan. This  is done to screen for osteoporosis. Talk with your health care provider about your test results, treatment options, and if necessary, the need for more tests. Follow these instructions at home: Eating and drinking  Eat a diet that includes fresh fruits and vegetables, whole grains, lean protein, and low-fat dairy products. Take vitamin and mineral supplements as recommended by your health care provider. Do not drink alcohol if: Your health care provider tells you not to drink. You are pregnant, may be pregnant, or are planning to become pregnant. If you drink alcohol: Limit how much you have to 0-1 drink a day. Know how much alcohol is in your drink. In the [place] one drink equals one 12 oz bottle of beer (355 mL), one 5 oz glass of wine (148 mL), or one 1 oz glass of hard liquor (44 mL). Lifestyle Brush your teeth every morning and night with fluoride toothpaste. Floss one time each day. Exercise for at least 30 minutes 5 or more days each week. Do not use any products that contain nicotine or tobacco. These products include cigarettes, chewing tobacco, and vaping devices, such as e-cigarettes. If you need help quitting, ask your health care provider. Do not use drugs. If you are sexually active, practice safe sex. Use a condom or other form of protection to prevent STIs. If you do not wish to become pregnant, use a form of birth control. If you plan to become pregnant, see your health care provider for a prepregnancy visit. Take aspirin only as told by your health care provider. Make sure that you understand how much to take and what form to take. Work with your health care provider to find out whether it is safe and beneficial for you to take aspirin daily. Find healthy ways to manage stress, such as: Meditation, yoga, or listening to music. Journaling. Talking to a trusted person. Spending time with friends and family. Minimize exposure to UV radiation to reduce your risk of skin  cancer. Safety Always wear your seat belt while driving or riding in a vehicle. Do not drive: If you have been drinking alcohol. Do not ride with someone who has been drinking. When you are tired or distracted. While texting. If you have been using any mind-altering substances or drugs. Wear a helmet and other protective equipment during sports activities. If you have firearms in your house, make sure you follow all gun safety procedures. Seek help if you have been physically or sexually abused. What's next? Visit your health care provider once a year for an annual wellness visit. Ask your health care provider how often you should have your eyes and teeth checked. Stay up to date on all vaccines. This information is not intended to replace advice given to you by your health care provider. Make sure you discuss any questions you have with your health care provider. Document Revised: 09/27/2020 Document Reviewed: 09/27/2020 Elsevier Patient Education  2024 [employer].

## 2022-09-20 NOTE — Assessment & Plan Note (Signed)
Pain is not controlled.  She has also developed several nodules on her extremities which are rheumatologist thinks may be due to her underlying psoriatic arthritis.  She is interested in being referred to a tertiary care center at this point.  Will place referral.  Will also refill her Celebrex.  She uses this sparingly as needed.  She is aware not to take with other NSAIDs.

## 2022-09-24 NOTE — Progress Notes (Signed)
Her triglycerides are very high.  Can we please verify that her labs were fasting?   Her A1c is up slightly.  The rest of her labs are all stable.  Do not need to make any changes to her treatment plan at this time.  She should continue to work on diet and exercise and we can recheck everything in a year or so.

## 2022-09-27 NOTE — Progress Notes (Signed)
Noted. If not fasting then we can recheck a fasting level again next time she comes in. Do not need to make any adjustments to her treatment plan. She should continue to work on diet and exercise.  Melinda Porter. Jimmey Ralph, MD 09/27/2022 7:25 AM

## 2022-10-04 ENCOUNTER — Encounter: Payer: Commercial Managed Care - PPO | Admitting: Internal Medicine

## 2022-10-11 ENCOUNTER — Encounter (INDEPENDENT_AMBULATORY_CARE_PROVIDER_SITE_OTHER): Payer: Commercial Managed Care - PPO | Admitting: Ophthalmology

## 2023-01-01 ENCOUNTER — Telehealth: Payer: Self-pay | Admitting: Oncology

## 2023-01-01 NOTE — Telephone Encounter (Signed)
Patient has been scheduled. Aware of appt date and time.

## 2023-01-01 NOTE — Telephone Encounter (Signed)
.  Contacted pt to schedule an appt. Unable to reach via phone, voicemail was left.     Transfer to [employer] w/ [employer] Received: Today [place], Sherren Mocha, [place]; [place], [place] Good morning!  This patient was scheduled to see Dr. Angelene Giovanni here on 9/27. She has decided she would like to keep seeing him there. Whenever you have a chance please reach out to her and see what day/time next week will work for her. Thank you!  Erie Noe

## 2023-01-02 ENCOUNTER — Other Ambulatory Visit: Payer: Self-pay | Admitting: Family Medicine

## 2023-01-02 DIAGNOSIS — I10 Essential (primary) hypertension: Secondary | ICD-10-CM

## 2023-01-10 ENCOUNTER — Other Ambulatory Visit: Payer: Commercial Managed Care - PPO

## 2023-01-10 ENCOUNTER — Inpatient Hospital Stay: Payer: Commercial Managed Care - PPO | Admitting: Oncology

## 2023-01-10 ENCOUNTER — Ambulatory Visit: Payer: Commercial Managed Care - PPO | Admitting: Oncology

## 2023-01-10 ENCOUNTER — Inpatient Hospital Stay: Payer: Commercial Managed Care - PPO

## 2023-01-17 ENCOUNTER — Encounter: Payer: Commercial Managed Care - PPO | Admitting: Internal Medicine

## 2023-01-19 ENCOUNTER — Telehealth: Payer: Commercial Managed Care - PPO | Admitting: Physician Assistant

## 2023-01-19 DIAGNOSIS — J019 Acute sinusitis, unspecified: Secondary | ICD-10-CM

## 2023-01-19 DIAGNOSIS — B9689 Other specified bacterial agents as the cause of diseases classified elsewhere: Secondary | ICD-10-CM

## 2023-01-19 MED ORDER — DOXYCYCLINE HYCLATE 100 MG PO TABS
100.0000 mg | ORAL_TABLET | Freq: Two times a day (BID) | ORAL | 0 refills | Status: DC
Start: 2023-01-19 — End: 2023-07-03

## 2023-01-19 NOTE — Progress Notes (Signed)
E-Visit for Sinus Problems  We are sorry that you are not feeling well.  Here is how we plan to help!  Based on what you have shared with me it looks like you have sinusitis.  Sinusitis is inflammation and infection in the sinus cavities of the head.  Based on your presentation I believe you most likely have Acute Bacterial Sinusitis.  This is an infection caused by bacteria and is treated with antibiotics. I have prescribed Doxycycline 100mg by mouth twice a day for 10 days. You may use an oral decongestant such as Mucinex D or if you have glaucoma or high blood pressure use plain Mucinex. Saline nasal spray help and can safely be used as often as needed for congestion.  If you develop worsening sinus pain, fever or notice severe headache and vision changes, or if symptoms are not better after completion of antibiotic, please schedule an appointment with a health care provider.    Sinus infections are not as easily transmitted as other respiratory infection, however we still recommend that you avoid close contact with loved ones, especially the very young and elderly.  Remember to wash your hands thoroughly throughout the day as this is the number one way to prevent the spread of infection!  Home Care: Only take medications as instructed by your medical team. Complete the entire course of an antibiotic. Do not take these medications with alcohol. A steam or ultrasonic humidifier can help congestion.  You can place a towel over your head and breathe in the steam from hot water coming from a faucet. Avoid close contacts especially the very young and the elderly. Cover your mouth when you cough or sneeze. Always remember to wash your hands.  Get Help Right Away If: You develop worsening fever or sinus pain. You develop a severe head ache or visual changes. Your symptoms persist after you have completed your treatment plan.  Make sure you Understand these instructions. Will watch your  condition. Will get help right away if you are not doing well or get worse.  Thank you for choosing an e-visit.  Your e-visit answers were reviewed by a board certified advanced clinical practitioner to complete your personal care plan. Depending upon the condition, your plan could have included both over the counter or prescription medications.  Please review your pharmacy choice. Make sure the pharmacy is open so you can pick up prescription now. If there is a problem, you may contact your provider through MyChart messaging and have the prescription routed to another pharmacy.  Your safety is important to us. If you have drug allergies check your prescription carefully.   For the next 24 hours you can use MyChart to ask questions about today's visit, request a non-urgent call back, or ask for a work or school excuse. You will get an email in the next two days asking about your experience. I hope that your e-visit has been valuable and will speed your recovery.  I have spent 5 minutes in review of e-visit questionnaire, review and updating patient chart, medical decision making and response to patient.   Shannon Kirkendall M Martika Egler, PA-C  

## 2023-01-20 ENCOUNTER — Ambulatory Visit: Payer: Commercial Managed Care - PPO | Admitting: Family Medicine

## 2023-01-20 VITALS — BP 132/88 | HR 82 | Temp 97.6°F | Ht 64.0 in | Wt 241.0 lb

## 2023-01-20 DIAGNOSIS — M5442 Lumbago with sciatica, left side: Secondary | ICD-10-CM | POA: Diagnosis not present

## 2023-01-20 DIAGNOSIS — M79605 Pain in left leg: Secondary | ICD-10-CM

## 2023-01-20 NOTE — Patient Instructions (Addendum)
As we discussed I think you be leg pain could be related to the back pain and possible pinched nerve.  This could be from some overuse last week, but I do think it will get better with little bit of time.  Since you have tolerated Celebrex previously, I think that would be reasonable to try for this pain as well.  Okay to use heat or ice to your low back if that feels better.  See other information below.  I expect your symptoms to be improving in the next week to 10 days but if any worsening or new symptoms please be seen.  Hang in there!  Sciatica  Sciatica is pain, numbness, weakness, or tingling along the path of the sciatic nerve. The sciatic nerve starts in the lower back and runs down the back of each leg. The nerve controls the muscles in the lower leg and in the back of the knee. It also provides feeling (sensation) to the back of the thigh, the lower leg, and the sole of the foot. Sciatica is a symptom of another medical condition that pinches or puts pressure on the sciatic nerve. Sciatica most often only affects one side of the body. Sciatica usually goes away on its own or with treatment. In some cases, sciatica may come back (recur). What are the causes? This condition is caused by pressure on the sciatic nerve or pinching of the nerve. This may be the result of: A disk in between the bones of the spine bulging out too far (herniated disk). Age-related changes in the spinal disks. A pain disorder that affects a muscle in the buttock. Extra bone growth near the sciatic nerve. A break (fracture) of the pelvis. Pregnancy. Tumor. This is rare. What increases the risk? The following factors may make you more likely to develop this condition: Playing sports that place pressure or stress on the spine. Having poor strength and flexibility. A history of back injury or surgery. Sitting for long periods of time. Doing activities that involve repetitive bending or lifting. Obesity. What are  the signs or symptoms? Symptoms can vary from mild to very severe. They may include: Any of the following problems in the lower back, leg, hip, or buttock: Mild tingling, numbness, or dull aches. Burning sensations. Sharp pains. Numbness in the back of the calf or the sole of the foot. Leg weakness. Severe back pain that makes movement difficult. Symptoms may get worse when you cough, sneeze, or laugh, or when you sit or stand for long periods of time. How is this diagnosed? This condition may be diagnosed based on: Your symptoms and medical history. A physical exam. Blood tests. Imaging tests, such as: X-rays. An MRI. A CT scan. How is this treated? In many cases, this condition improves on its own without treatment. However, treatment may include: Reducing or modifying physical activity. Exercising, including strengthening and stretching. Icing and applying heat to the affected area. Medicines that help to: Relieve pain and swelling. Relax your muscles. Injections of medicines that help to relieve pain and inflammation (steroids) around the sciatic nerve. Surgery. Follow these instructions at home: Medicines Take over-the-counter and prescription medicines only as told by your health care provider. Ask your health care provider if the medicine prescribed to you requires you to avoid driving or using heavy machinery. Managing pain     If directed, put ice on the affected area. To do this: Put ice in a plastic bag. Place a towel between your skin and  the bag. Leave the ice on for 20 minutes, 2-3 times a day. If your skin turns bright red, remove the ice right away to prevent skin damage. The risk of skin damage is higher if you cannot feel pain, heat, or cold. If directed, apply heat to the affected area as often as told by your health care provider. Use the heat source that your health care provider recommends, such as a moist heat pack or a heating pad. Place a towel  between your skin and the heat source. Leave the heat on for 20-30 minutes. If your skin turns bright red, remove the heat right away to prevent burns. The risk of burns is higher if you cannot feel pain, heat, or cold. Activity  Return to your normal activities as told by your health care provider. Ask your health care provider what activities are safe for you. Avoid activities that make your symptoms worse. Take brief periods of rest throughout the day. When you rest for longer periods, mix in some mild activity or stretching between periods of rest. This will help to prevent stiffness and pain. Avoid sitting for long periods of time without moving. Get up and move around at least one time each hour. Exercise and stretch regularly as told by your health care provider. Do not lift anything that is heavier than 10 lb (4.5 kg) until your health care provider says that it is safe. When you do not have symptoms, you should still avoid heavy lifting, especially repetitive heavy lifting. When you lift objects, always use proper lifting technique, which includes: Bending your knees. Keeping the load close to your body. Avoiding twisting. General instructions Maintain a healthy weight. Excess weight puts extra stress on your back. Wear supportive, comfortable shoes. Avoid wearing high heels. Avoid sleeping on a mattress that is too soft or too hard. A mattress that is firm enough to support your back when you sleep may help to reduce your pain. Contact a health care provider if: Your pain is not controlled by medicine. Your pain does not improve or gets worse. Your pain lasts longer than 4 weeks. You have unexplained weight loss. Get help right away if: You are not able to control when you urinate or have bowel movements (incontinence). You have: Weakness in your lower back, pelvis, buttocks, or legs that gets worse. Redness or swelling of your back. A burning sensation when you  urinate. Summary Sciatica is pain, numbness, weakness, or tingling along the path of the sciatic nerve, which may include the lower back, legs, hips, and buttocks. This condition is caused by pressure on the sciatic nerve or pinching of the nerve. Treatment often includes rest, exercise, medicines, and applying ice or heat. This information is not intended to replace advice given to you by your health care provider. Make sure you discuss any questions you have with your health care provider. Document Revised: 07/09/2021 Document Reviewed: 07/09/2021 Else[employer] Patient Education  2024 [employer]   Acute Back Pain, Adult Acute back pain is sudden and usually short-lived. It is often caused by an injury to the muscles and tissues in the back. The injury may result from: A muscle, tendon, or ligament getting overstretched or torn. Ligaments are tissues that connect bones to each other. Lifting something improperly can cause a back strain. Wear and tear (degeneration) of the spinal disks. Spinal disks are circular tissue that provide cushioning between the bones of the spine (vertebrae). Twisting motions, such as while playing sports or doing  yard work. A hit to the back. Arthritis. You may have a physical exam, lab tests, and imaging tests to find the cause of your pain. Acute back pain usually goes away with rest and home care. Follow these instructions at home: Managing pain, stiffness, and swelling Take over-the-counter and prescription medicines only as told by your health care provider. Treatment may include medicines for pain and inflammation that are taken by mouth or applied to the skin, or muscle relaxants. Your health care provider may recommend applying ice during the first 24-48 hours after your pain starts. To do this: Put ice in a plastic bag. Place a towel between your skin and the bag. Leave the ice on for 20 minutes, 2-3 times a day. Remove the ice if your skin turns bright  red. This is very important. If you cannot feel pain, heat, or cold, you have a greater risk of damage to the area. If directed, apply heat to the affected area as often as told by your health care provider. Use the heat source that your health care provider recommends, such as a moist heat pack or a heating pad. Place a towel between your skin and the heat source. Leave the heat on for 20-30 minutes. Remove the heat if your skin turns bright red. This is especially important if you are unable to feel pain, heat, or cold. You have a greater risk of getting burned. Activity  Do not stay in bed. Staying in bed for more than 1-2 days can delay your recovery. Sit up and stand up straight. Avoid leaning forward when you sit or hunching over when you stand. If you work at a desk, sit close to it so you do not need to lean over. Keep your chin tucked in. Keep your neck drawn back, and keep your elbows bent at a 90-degree angle (right angle). Sit high and close to the steering wheel when you drive. Add lower back (lumbar) support to your car seat, if needed. Take short walks on even surfaces as soon as you are able. Try to increase the length of time you walk each day. Do not sit, drive, or stand in one place for more than 30 minutes at a time. Sitting or standing for long periods of time can put stress on your back. Do not drive or use heavy machinery while taking prescription pain medicine. Use proper lifting techniques. When you bend and lift, use positions that put less stress on your back: Toast your knees. Keep the load close to your body. Avoid twisting. Exercise regularly as told by your health care provider. Exercising helps your back heal faster and helps prevent back injuries by keeping muscles strong and flexible. Work with a physical therapist to make a safe exercise program, as recommended by your health care provider. Do any exercises as told by your physical therapist. Lifestyle Maintain  a healthy weight. Extra weight puts stress on your back and makes it difficult to have good posture. Avoid activities or situations that make you feel anxious or stressed. Stress and anxiety increase muscle tension and can make back pain worse. Learn ways to manage anxiety and stress, such as through exercise. General instructions Sleep on a firm mattress in a comfortable position. Try lying on your side with your knees slightly bent. If you lie on your back, put a pillow under your knees. Keep your head and neck in a straight line with your spine (neutral position) when using electronic equipment like smartphones or  pads. To do this: Raise your smartphone or pad to look at it instead of bending your head or neck to look down. Put the smartphone or pad at the level of your face while looking at the screen. Follow your treatment plan as told by your health care provider. This may include: Cognitive or behavioral therapy. Acupuncture or massage therapy. Meditation or yoga. Contact a health care provider if: You have pain that is not relieved with rest or medicine. You have increasing pain going down into your legs or buttocks. Your pain does not improve after 2 weeks. You have pain at night. You lose weight without trying. You have a fever or chills. You develop nausea or vomiting. You develop abdominal pain. Get help right away if: You develop new bowel or bladder control problems. You have unusual weakness or numbness in your arms or legs. You feel faint. These symptoms may represent a serious problem that is an emergency. Do not wait to see if the symptoms will go away. Get medical help right away. Call your local emergency services (911 in the [place] Do not drive yourself to the hospital. Summary Acute back pain is sudden and usually short-lived. Use proper lifting techniques. When you bend and lift, use positions that put less stress on your back. Take over-the-counter and prescription  medicines only as told by your health care provider, and apply heat or ice as told. This information is not intended to replace advice given to you by your health care provider. Make sure you discuss any questions you have with your health care provider. Document Revised: 06/23/2020 Document Reviewed: 06/23/2020 [employer] Patient Education  2024 [employer].

## 2023-01-20 NOTE — Progress Notes (Unsigned)
Subjective:  Patient ID: Melinda Porter, female    DOB: 1970/09/13  Age: 52 y.o. MRN: [id number]  CC:  Chief Complaint  Patient presents with   Leg Pain    Pt notes Lt calf and foot pain starting after carrying a few big cases of water, started as foot arch pain, then spread with muscle aching, causing some lower back pain, notes has not found relief     HPI Melinda Porter presents for   Left leg pain: Noticed left leg pain yesterday. Carried 3 cases of water into home last week. No initial leg pain, but some pain in low back after carrying in water - middle of back. Hx of recurrent back pain, but new pain down left leg yesterday. Pain down back of leg - lower left leg and under foot. Hx of bone spurs in feet.  No attempted treatments. Recently started doxycyline for sinus issues yesterday.  No new leg weakness, No bowel or bladder incontinence, no saddle anesthesia.  DVT after hysterectomy in 2010.  Multiple allergies - including prednisone and nsaids, gabapentin.  Has been prescribed celebrex in the past and tolerated - last took a week or so ago. Has some at home.    History Patient Active Problem List   Diagnosis Date Noted   Recurrent low back pain 09/20/2022   Hepatic steatosis 04/24/2022   Multiple skin nodules 04/24/2022   Thrombocytopenia (HCC) 04/05/2022   Splenomegaly 04/05/2022   Sleep-disordered breathing 11/12/2021   Elevated lipase 10/23/2021   Prediabetes 01/01/2021   Morbid obesity (HCC) 07/10/2020   Depression, major, single episode, moderate (HCC) 05/29/2020   Anxiety 05/29/2020   PTSD (post-traumatic stress disorder) 05/29/2020   Vitamin D deficiency 05/29/2020   Iron deficiency anemia 05/29/2020   GERD (gastroesophageal reflux disease) 05/29/2020   Sjogren's syndrome (HCC) 05/29/2020   Seasonal allergies 05/29/2020   Vitamin B12 deficiency 05/29/2020   Cold sore 05/29/2020   Systemic lupus erythematosus (HCC) 12/29/2019   Ulcerative colitis (HCC)  01/05/2017   Persistent headaches 11/05/2014   Facial droop 11/05/2014   Syncope 11/05/2014   Hypertension 11/05/2014   Hyperlipemia 11/05/2014   Psoriatic arthritis (HCC) 11/05/2014   Past Medical History:  Diagnosis Date   Anemia    Anxiety    Arthritis    Back pain    Bilateral swelling of feet    Cataract    not a surgical candidate at this time (08/22/2022)   Depression    Endometriosis    Fatty liver    Fibromyalgia    GERD (gastroesophageal reflux disease)    Hiatal hernia    Hyperlipemia    Hypertension    IBS (irritable bowel syndrome)    Joint pain    Lupus (HCC)    Macular degeneration of both eyes    PONV (postoperative nausea and vomiting)    Psoriatic arthritis (HCC)    PTSD (post-traumatic stress disorder)    Seasonal allergies    Sjogren's disease (HCC)    Sleep-disordered breathing 11/12/2021   UC (ulcerative colitis) (HCC)    Vitamin D deficiency    Past Surgical History:  Procedure Laterality Date   CESAREAN SECTION  1997   COLONOSCOPY  2021   JP-MAC-suprep(exc)-patchy erythema otherwise normal - recall 5 years   FLEXIBLE SIGMOIDOSCOPY N/A 01/06/2017   Procedure: FLEXIBLE SIGMOIDOSCOPY;  Surgeon: Benancio Deeds, MD;  Location: [facility];  Service: Gastroenterology;  Laterality: N/A;   OOPHORECTOMY Bilateral    PARTIAL HYSTERECTOMY  07/2009  TEMPOROMANDIBULAR JOINT SURGERY Right 2009   TONSILLECTOMY  2009   Allergies  Allergen Reactions   Lyrica [Pregabalin] Shortness Of Breath   Sulfa Antibiotics Rash and Other (See Comments)   Ciprofloxacin Nausea Only   Gabapentin Other (See Comments)    Stroke like symptoms   Morphine Sulfate     Other reaction(s): Unknown   Nsaids Other (See Comments)    Liver problems- can't take tylenol, ibuprofen etc.   Prednisone Other (See Comments)    "I get deathly ill"   Remicade [Infliximab]    Tolmetin     Other reaction(s): Other (See Comments) Liver problems- can't take tylenol, ibuprofen  etc.   Codeine Rash    Other reaction(s): Unknown   Humira [Adalimumab] Rash    Worsening of Lupus rash.   Morphine And Codeine Rash    "Wired up"   Penicillins Rash    Has patient had a PCN reaction causing immediate rash, facial/tongue/throat swelling, SOB or lightheadedness with hypotension: NO Has patient had a PCN reaction causing severe rash involving mucus membranes or skin necrosis: NO Has patient had a PCN reaction that required hospitalization NO Has patient had a PCN reaction occurring within the last 10 years: NO If all of the above answers are "NO", then may proceed with Cephalosporin use.   Prior to Admission medications   Medication Sig Start Date End Date Taking? Authorizing Provider  Apremilast 30 MG TABS Take 1 tablet by mouth in the morning and at bedtime.    [provider]  Ascorbic Acid (VITA-C PO) Take 3 tablets by mouth daily at 6 (six) AM. 3 gummies a day    [provider]  celecoxib (CELEBREX) 200 MG capsule Take 1 capsule (200 mg total) by mouth 2 (two) times daily. 09/20/22   Ardith Dark, MD  clonazePAM (KLONOPIN) 1 MG tablet Take 1 tablet (1 mg total) by mouth 3 (three) times daily as needed for anxiety. 09/20/22   Ardith Dark, MD  cromolyn (OPTICROM) 4 % ophthalmic solution Place 1 drop into both eyes 4 (four) times daily. Patient taking differently: Place 1 drop into both eyes 4 (four) times daily as needed. 02/08/22   Dulce Sellar, NP  cyclobenzaprine (FLEXERIL) 10 MG tablet Take 1-2 tablets (10-20 mg total) by mouth 3 (three) times daily as needed for muscle spasms. 03/16/21   Dulce Sellar, NP  diphenoxylate-atropine (LOMOTIL) 2.5-0.025 MG tablet Take 1 tablet by mouth 2 (two) times daily as needed for diarrhea or loose stools. 04/25/21   Hilarie Fredrickson, MD  doxycycline (VIBRA-TABS) 100 MG tablet Take 1 tablet (100 mg total) by mouth 2 (two) times daily. 01/19/23   Margaretann Loveless, PA-C  ferrous sulfate 324 (65 Fe) MG TBEC  TAKE 1 TABLET(324 MG) BY MOUTH IN THE MORNING AND AT BEDTIME 07/29/22   Hilarie Fredrickson, MD  fluticasone [facility]) 50 MCG/ACT nasal spray Place 2 sprays into both nostrils daily. 09/20/22   Ardith Dark, MD  glucose 4 GM chewable tablet Chew 1 tablet (4 g total) by mouth as needed for low blood sugar. 06/25/21   Jeani Sow, MD  halobetasol (ULTRAVATE) 0.05 % cream Apply 1 Application topically daily as needed. 12/19/21   [provider]  losartan (COZAAR) 100 MG tablet TAKE 1 TABLET(100 MG) BY MOUTH DAILY 01/03/23   Ardith Dark, MD  meclizine (ANTIVERT) 25 MG tablet Take 1 tablet (25 mg total) by mouth 3 (three) times daily as needed for dizziness.  03/08/22   Margaretann Loveless, PA-C  mesalamine (LIALDA) 1.2 g EC tablet TAKE 4 TABLETS(4.8 GRAMS) BY MOUTH DAILY 05/09/22   Hilarie Fredrickson, MD  ondansetron (ZOFRAN) 8 MG tablet Take 1 tablet (8 mg total) by mouth every 8 (eight) hours as needed for nausea or vomiting. 09/20/22   Ardith Dark, MD  ORENCIA CLICKJECT 125 MG/ML SOAJ Inject 1 Syringe into the skin once a week. 07/08/22   [provider]  pantoprazole (PROTONIX) 40 MG tablet Take 1 tablet (40 mg total) by mouth daily. 09/20/22   Ardith Dark, MD  Potassium 95 MG TABS Take 1 tablet by mouth daily at 6 (six) AM.    [provider]  promethazine (PHENERGAN) 25 MG tablet 1 tablet as needed 03/06/18   [provider]  sertraline (ZOLOFT) 100 MG tablet Take 1.5 tablets (150 mg total) by mouth daily. 09/20/22 09/15/23  Ardith Dark, MD  Sodium Sulfate-Mag Sulfate-KCl (SUTAB) [phone] MG TABS Take 1 tablet by mouth as directed. BIN: [id number]  PCN: [id number]  GROUP: [id number]  MEMBER ID: [id number] 08/22/22   Hilarie Fredrickson, MD  topiramate (TOPAMAX) 100 MG tablet Take 1 tablet (100 mg total) by mouth 2 (two) times daily. TAKE 1 TABLET(100 MG) BY MOUTH TWICE DAILY. 09/20/22 09/15/23  Ardith Dark, MD  valACYclovir (VALTREX) 1000 MG tablet Take 2 tablets (2,000 mg total)  by mouth as needed. For two doses for cold sores. 09/20/22   Ardith Dark, MD  Vitamin D, Ergocalciferol, (DRISDOL) 1.25 MG (50000 UNIT) CAPS capsule Take 1 capsule (50,000 Units total) by mouth every 7 (seven) days. 09/20/22   Ardith Dark, MD  zinc gluconate 50 MG tablet Take 50 mg by mouth daily.    [provider]   Social History   Socioeconomic History   Marital status: Widowed    Spouse name: Not on file   Number of children: 1   Years of education: Not on file   Highest education level: Not on file  Occupational History   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   Social Determinants of Health   Financial Resource Strain: Not on file  Food Insecurity: Not on file  Transportation Needs: Not on file  Physical Activity: Not on file  Stress: Not on file  Social Connections: Not on file  Intimate Partner Violence: Not on file    Review of Systems   Objective:   Vitals:   01/20/23 1444  BP: 132/88  Pulse: 82  Temp: 97.6 F (36.4 C)  TempSrc: Temporal  SpO2: 98%  Weight: 241 lb (109.3 kg)  Height: 5\' 4"  (1.626 m)     Physical Exam Constitutional:      General: She is not in acute distress.    Appearance: Normal appearance. She is well-developed.  HENT:     Head: Normocephalic and atraumatic.  Cardiovascular:     Rate  and Rhythm: Normal rate.  Pulmonary:     Effort: Pulmonary effort is normal.  Musculoskeletal:     Comments: Lumbar spine, general discomfort around the  paraspinals into the mid aspect of lower lumbar spine.  No focal bony tenderness.  Negative seated straight leg raise on the right, dysesthesia symptoms into the lower leg with straight leg raise on left.  Ambulating without assistive device.  No appreciable weakness.  Slight discomfort at the medial upper calf bilaterally without cords or focal calf swelling.  Calf circumference was equal measured at 15 cm below patella bilaterally (41 cm).  No rash.  Neurological:     Mental Status: She is alert and oriented to person, place, and time.  Psychiatric:        Mood and Affect: Mood normal.        Assessment & Plan:  Abira Kokal Tigges is a 52 y.o. female . Bilateral low back pain with left-sided sciatica, unspecified chronicity  Left leg pain   No orders of the defined types were placed in this encounter.  Patient Instructions  As we discussed I think you be leg pain could be related to the back pain and possible pinched nerve.  This could be from some overuse last week, but I do think it will get better with little bit of time.  Since you have tolerated Celebrex previously, I think that would be reasonable to try for this pain as well.  Okay to use heat or ice to your low back if that feels better.  See other information below.  I expect your symptoms to be improving in the next week to 10 days but if any worsening or new symptoms please be seen.  Hang in there!  Sciatica  Sciatica is pain, numbness, weakness, or tingling along the path of the sciatic nerve. The sciatic nerve starts in the lower back and runs down the back of each leg. The nerve controls the muscles in the lower leg and in the back of the knee. It also provides feeling (sensation) to the back of the thigh, the lower leg, and the sole of the foot. Sciatica is a symptom of another medical condition that pinches or puts pressure on the sciatic nerve. Sciatica most often only affects one side of the body. Sciatica usually goes  away on its own or with treatment. In some cases, sciatica may come back (recur). What are the causes? This condition is caused by pressure on the sciatic nerve or pinching of the nerve. This may be the result of: A disk in between the bones of the spine bulging out too far (herniated disk). Age-related changes in the spinal disks. A pain disorder that affects a muscle in the buttock. Extra bone growth near the sciatic nerve. A break (fracture) of the pelvis. Pregnancy. Tumor. This is rare. What increases the risk? The following factors may make you more likely to develop this condition: Playing sports that place pressure or stress on the spine. Having poor strength and flexibility. A history of back injury or surgery. Sitting for long periods of time. Doing activities that involve repetitive bending or lifting. Obesity. What are the signs or symptoms? Symptoms can vary from mild to very severe. They may include: Any of the following problems in the lower back, leg, hip, or buttock: Mild tingling, numbness, or dull aches. Burning sensations. Sharp pains. Numbness in the back of the calf or the sole of the foot. Leg weakness. Severe back pain that makes  movement difficult. Symptoms may get worse when you cough, sneeze, or laugh, or when you sit or stand for long periods of time. How is this diagnosed? This condition may be diagnosed based on: Your symptoms and medical history. A physical exam. Blood tests. Imaging tests, such as: X-rays. An MRI. A CT scan. How is this treated? In many cases, this condition improves on its own without treatment. However, treatment may include: Reducing or modifying physical activity. Exercising, including strengthening and stretching. Icing and applying heat to the affected area. Medicines that help to: Relieve pain and swelling. Relax your muscles. Injections of medicines that help to relieve pain and inflammation (steroids) around the  sciatic nerve. Surgery. Follow these instructions at home: Medicines Take over-the-counter and prescription medicines only as told by your health care provider. Ask your health care provider if the medicine prescribed to you requires you to avoid driving or using heavy machinery. Managing pain     If directed, put ice on the affected area. To do this: Put ice in a plastic bag. Place a towel between your skin and the bag. Leave the ice on for 20 minutes, 2-3 times a day. If your skin turns bright red, remove the ice right away to prevent skin damage. The risk of skin damage is higher if you cannot feel pain, heat, or cold. If directed, apply heat to the affected area as often as told by your health care provider. Use the heat source that your health care provider recommends, such as a moist heat pack or a heating pad. Place a towel between your skin and the heat source. Leave the heat on for 20-30 minutes. If your skin turns bright red, remove the heat right away to prevent burns. The risk of burns is higher if you cannot feel pain, heat, or cold. Activity  Return to your normal activities as told by your health care provider. Ask your health care provider what activities are safe for you. Avoid activities that make your symptoms worse. Take brief periods of rest throughout the day. When you rest for longer periods, mix in some mild activity or stretching between periods of rest. This will help to prevent stiffness and pain. Avoid sitting for long periods of time without moving. Get up and move around at least one time each hour. Exercise and stretch regularly as told by your health care provider. Do not lift anything that is heavier than 10 lb (4.5 kg) until your health care provider says that it is safe. When you do not have symptoms, you should still avoid heavy lifting, especially repetitive heavy lifting. When you lift objects, always use proper lifting technique, which  includes: Bending your knees. Keeping the load close to your body. Avoiding twisting. General instructions Maintain a healthy weight. Excess weight puts extra stress on your back. Wear supportive, comfortable shoes. Avoid wearing high heels. Avoid sleeping on a mattress that is too soft or too hard. A mattress that is firm enough to support your back when you sleep may help to reduce your pain. Contact a health care provider if: Your pain is not controlled by medicine. Your pain does not improve or gets worse. Your pain lasts longer than 4 weeks. You have unexplained weight loss. Get help right away if: You are not able to control when you urinate or have bowel movements (incontinence). You have: Weakness in your lower back, pelvis, buttocks, or legs that gets worse. Redness or swelling of your back. A burning sensation when  you urinate. Summary Sciatica is pain, numbness, weakness, or tingling along the path of the sciatic nerve, which may include the lower back, legs, hips, and buttocks. This condition is caused by pressure on the sciatic nerve or pinching of the nerve. Treatment often includes rest, exercise, medicines, and applying ice or heat. This information is not intended to replace advice given to you by your health care provider. Make sure you discuss any questions you have with your health care provider. Document Revised: 07/09/2021 Document Reviewed: 07/09/2021 Elsevier Patient Education  2024 Else[employer] Inc.   Acute Back Pain, Adult Acute back pain is sudden and usually short-lived. It is often caused by an injury to the muscles and tissues in the back. The injury may result from: A muscle, tendon, or ligament getting overstretched or torn. Ligaments are tissues that connect bones to each other. Lifting something improperly can cause a back strain. Wear and tear (degeneration) of the spinal disks. Spinal disks are circular tissue that provide cushioning between the bones  of the spine (vertebrae). Twisting motions, such as while playing sports or doing yard work. A hit to the back. Arthritis. You may have a physical exam, lab tests, and imaging tests to find the cause of your pain. Acute back pain usually goes away with rest and home care. Follow these instructions at home: Managing pain, stiffness, and swelling Take over-the-counter and prescription medicines only as told by your health care provider. Treatment may include medicines for pain and inflammation that are taken by mouth or applied to the skin, or muscle relaxants. Your health care provider may recommend applying ice during the first 24-48 hours after your pain starts. To do this: Put ice in a plastic bag. Place a towel between your skin and the bag. Leave the ice on for 20 minutes, 2-3 times a day. Remove the ice if your skin turns bright red. This is very important. If you cannot feel pain, heat, or cold, you have a greater risk of damage to the area. If directed, apply heat to the affected area as often as told by your health care provider. Use the heat source that your health care provider recommends, such as a moist heat pack or a heating pad. Place a towel between your skin and the heat source. Leave the heat on for 20-30 minutes. Remove the heat if your skin turns bright red. This is especially important if you are unable to feel pain, heat, or cold. You have a greater risk of getting burned. Activity  Do not stay in bed. Staying in bed for more than 1-2 days can delay your recovery. Sit up and stand up straight. Avoid leaning forward when you sit or hunching over when you stand. If you work at a desk, sit close to it so you do not need to lean over. Keep your chin tucked in. Keep your neck drawn back, and keep your elbows bent at a 90-degree angle (right angle). Sit high and close to the steering wheel when you drive. Add lower back (lumbar) support to your car seat, if needed. Take short  walks on even surfaces as soon as you are able. Try to increase the length of time you walk each day. Do not sit, drive, or stand in one place for more than 30 minutes at a time. Sitting or standing for long periods of time can put stress on your back. Do not drive or use heavy machinery while taking prescription pain medicine. Use proper lifting techniques.  When you bend and lift, use positions that put less stress on your back: [place] your knees. Keep the load close to your body. Avoid twisting. Exercise regularly as told by your health care provider. Exercising helps your back heal faster and helps prevent back injuries by keeping muscles strong and flexible. Work with a physical therapist to make a safe exercise program, as recommended by your health care provider. Do any exercises as told by your physical therapist. Lifestyle Maintain a healthy weight. Extra weight puts stress on your back and makes it difficult to have good posture. Avoid activities or situations that make you feel anxious or stressed. Stress and anxiety increase muscle tension and can make back pain worse. Learn ways to manage anxiety and stress, such as through exercise. General instructions Sleep on a firm mattress in a comfortable position. Try lying on your side with your knees slightly bent. If you lie on your back, put a pillow under your knees. Keep your head and neck in a straight line with your spine (neutral position) when using electronic equipment like smartphones or pads. To do this: Raise your smartphone or pad to look at it instead of bending your head or neck to look down. Put the smartphone or pad at the level of your face while looking at the screen. Follow your treatment plan as told by your health care provider. This may include: Cognitive or behavioral therapy. Acupuncture or massage therapy. Meditation or yoga. Contact a health care provider if: You have pain that is not relieved with rest or  medicine. You have increasing pain going down into your legs or buttocks. Your pain does not improve after 2 weeks. You have pain at night. You lose weight without trying. You have a fever or chills. You develop nausea or vomiting. You develop abdominal pain. Get help right away if: You develop new bowel or bladder control problems. You have unusual weakness or numbness in your arms or legs. You feel faint. These symptoms may represent a serious problem that is an emergency. Do not wait to see if the symptoms will go away. Get medical help right away. Call your local emergency services (911 in the [place] Do not drive yourself to the hospital. Summary Acute back pain is sudden and usually short-lived. Use proper lifting techniques. When you bend and lift, use positions that put less stress on your back. Take over-the-counter and prescription medicines only as told by your health care provider, and apply heat or ice as told. This information is not intended to replace advice given to you by your health care provider. Make sure you discuss any questions you have with your health care provider. Document Revised: 06/23/2020 Document Reviewed: 06/23/2020 Elsevier Patient Education  2024 [employer] Inc.     Signed,   Meredith Staggers, MD [facility], [facility] 01/20/23 3:31 PM

## 2023-01-21 ENCOUNTER — Encounter: Payer: Self-pay | Admitting: Family Medicine

## 2023-01-24 ENCOUNTER — Inpatient Hospital Stay: Payer: Commercial Managed Care - PPO | Attending: Oncology | Admitting: Oncology

## 2023-01-24 ENCOUNTER — Encounter: Payer: Self-pay | Admitting: Oncology

## 2023-01-24 ENCOUNTER — Inpatient Hospital Stay: Payer: Commercial Managed Care - PPO

## 2023-01-24 VITALS — BP 145/90 | HR 86 | Temp 98.1°F | Resp 14 | Ht 64.0 in | Wt 245.0 lb

## 2023-01-24 DIAGNOSIS — E162 Hypoglycemia, unspecified: Secondary | ICD-10-CM | POA: Diagnosis not present

## 2023-01-24 DIAGNOSIS — M3500 Sicca syndrome, unspecified: Secondary | ICD-10-CM | POA: Insufficient documentation

## 2023-01-24 DIAGNOSIS — Z801 Family history of malignant neoplasm of trachea, bronchus and lung: Secondary | ICD-10-CM | POA: Insufficient documentation

## 2023-01-24 DIAGNOSIS — Z8249 Family history of ischemic heart disease and other diseases of the circulatory system: Secondary | ICD-10-CM | POA: Insufficient documentation

## 2023-01-24 DIAGNOSIS — D509 Iron deficiency anemia, unspecified: Secondary | ICD-10-CM | POA: Diagnosis not present

## 2023-01-24 DIAGNOSIS — Z888 Allergy status to other drugs, medicaments and biological substances status: Secondary | ICD-10-CM | POA: Insufficient documentation

## 2023-01-24 DIAGNOSIS — K5190 Ulcerative colitis, unspecified, without complications: Secondary | ICD-10-CM | POA: Insufficient documentation

## 2023-01-24 DIAGNOSIS — Z90722 Acquired absence of ovaries, bilateral: Secondary | ICD-10-CM | POA: Insufficient documentation

## 2023-01-24 DIAGNOSIS — E538 Deficiency of other specified B group vitamins: Secondary | ICD-10-CM

## 2023-01-24 DIAGNOSIS — Z882 Allergy status to sulfonamides status: Secondary | ICD-10-CM | POA: Insufficient documentation

## 2023-01-24 DIAGNOSIS — E611 Iron deficiency: Secondary | ICD-10-CM | POA: Diagnosis not present

## 2023-01-24 DIAGNOSIS — F32A Depression, unspecified: Secondary | ICD-10-CM | POA: Insufficient documentation

## 2023-01-24 DIAGNOSIS — I10 Essential (primary) hypertension: Secondary | ICD-10-CM | POA: Insufficient documentation

## 2023-01-24 DIAGNOSIS — Z8349 Family history of other endocrine, nutritional and metabolic diseases: Secondary | ICD-10-CM | POA: Insufficient documentation

## 2023-01-24 DIAGNOSIS — E785 Hyperlipidemia, unspecified: Secondary | ICD-10-CM | POA: Diagnosis not present

## 2023-01-24 DIAGNOSIS — D696 Thrombocytopenia, unspecified: Secondary | ICD-10-CM | POA: Diagnosis not present

## 2023-01-24 DIAGNOSIS — Z79899 Other long term (current) drug therapy: Secondary | ICD-10-CM | POA: Insufficient documentation

## 2023-01-24 DIAGNOSIS — R21 Rash and other nonspecific skin eruption: Secondary | ICD-10-CM | POA: Diagnosis not present

## 2023-01-24 DIAGNOSIS — Z885 Allergy status to narcotic agent status: Secondary | ICD-10-CM | POA: Insufficient documentation

## 2023-01-24 DIAGNOSIS — Z860100 Personal history of colon polyps, unspecified: Secondary | ICD-10-CM | POA: Diagnosis not present

## 2023-01-24 DIAGNOSIS — R92313 Mammographic fatty tissue density, bilateral breasts: Secondary | ICD-10-CM | POA: Diagnosis not present

## 2023-01-24 DIAGNOSIS — L4050 Arthropathic psoriasis, unspecified: Secondary | ICD-10-CM | POA: Insufficient documentation

## 2023-01-24 DIAGNOSIS — L905 Scar conditions and fibrosis of skin: Secondary | ICD-10-CM | POA: Diagnosis not present

## 2023-01-24 DIAGNOSIS — R229 Localized swelling, mass and lump, unspecified: Secondary | ICD-10-CM

## 2023-01-24 DIAGNOSIS — M2550 Pain in unspecified joint: Secondary | ICD-10-CM | POA: Diagnosis not present

## 2023-01-24 DIAGNOSIS — M797 Fibromyalgia: Secondary | ICD-10-CM | POA: Insufficient documentation

## 2023-01-24 DIAGNOSIS — K760 Fatty (change of) liver, not elsewhere classified: Secondary | ICD-10-CM | POA: Insufficient documentation

## 2023-01-24 DIAGNOSIS — K068 Other specified disorders of gingiva and edentulous alveolar ridge: Secondary | ICD-10-CM | POA: Diagnosis not present

## 2023-01-24 DIAGNOSIS — M7989 Other specified soft tissue disorders: Secondary | ICD-10-CM | POA: Insufficient documentation

## 2023-01-24 DIAGNOSIS — Z833 Family history of diabetes mellitus: Secondary | ICD-10-CM | POA: Insufficient documentation

## 2023-01-24 DIAGNOSIS — R519 Headache, unspecified: Secondary | ICD-10-CM | POA: Insufficient documentation

## 2023-01-24 DIAGNOSIS — Z83719 Family history of colon polyps, unspecified: Secondary | ICD-10-CM | POA: Insufficient documentation

## 2023-01-24 DIAGNOSIS — Z886 Allergy status to analgesic agent status: Secondary | ICD-10-CM | POA: Insufficient documentation

## 2023-01-24 DIAGNOSIS — R5383 Other fatigue: Secondary | ICD-10-CM | POA: Insufficient documentation

## 2023-01-24 DIAGNOSIS — K219 Gastro-esophageal reflux disease without esophagitis: Secondary | ICD-10-CM | POA: Insufficient documentation

## 2023-01-24 DIAGNOSIS — Z880 Allergy status to penicillin: Secondary | ICD-10-CM | POA: Insufficient documentation

## 2023-01-24 DIAGNOSIS — Z800 Family history of malignant neoplasm of digestive organs: Secondary | ICD-10-CM | POA: Insufficient documentation

## 2023-01-24 DIAGNOSIS — Z9089 Acquired absence of other organs: Secondary | ICD-10-CM | POA: Insufficient documentation

## 2023-01-24 DIAGNOSIS — F419 Anxiety disorder, unspecified: Secondary | ICD-10-CM | POA: Insufficient documentation

## 2023-01-24 DIAGNOSIS — Z87891 Personal history of nicotine dependence: Secondary | ICD-10-CM | POA: Insufficient documentation

## 2023-01-24 DIAGNOSIS — R200 Anesthesia of skin: Secondary | ICD-10-CM | POA: Insufficient documentation

## 2023-01-24 DIAGNOSIS — R599 Enlarged lymph nodes, unspecified: Secondary | ICD-10-CM | POA: Diagnosis not present

## 2023-01-24 DIAGNOSIS — Z881 Allergy status to other antibiotic agents status: Secondary | ICD-10-CM | POA: Insufficient documentation

## 2023-01-24 LAB — COMPREHENSIVE METABOLIC PANEL
ALT: 21 U/L (ref 0–44)
AST: 15 U/L (ref 15–41)
Albumin: 4 g/dL (ref 3.5–5.0)
Alkaline Phosphatase: 86 U/L (ref 38–126)
Anion gap: 10 (ref 5–15)
BUN: 17 mg/dL (ref 6–20)
CO2: 20 mmol/L — ABNORMAL LOW (ref 22–32)
Calcium: 8.4 mg/dL — ABNORMAL LOW (ref 8.9–10.3)
Chloride: 107 mmol/L (ref 98–111)
Creatinine, Ser: 0.65 mg/dL (ref 0.44–1.00)
GFR, Estimated: >60 mL/min (ref >60)
Glucose, Bld: 158 mg/dL — ABNORMAL HIGH (ref 70–99)
Potassium: 3.6 mmol/L (ref 3.5–5.1)
Sodium: 137 mmol/L (ref 135–145)
Total Bilirubin: 0.4 mg/dL (ref 0.3–1.2)
Total Protein: 7 g/dL (ref 6.5–8.1)

## 2023-01-24 LAB — CBC WITH DIFFERENTIAL/PLATELET
Abs Immature Granulocytes: 0.07 10*3/uL (ref 0.00–0.07)
Basophils Absolute: 0.1 10*3/uL (ref 0.0–0.1)
Basophils Relative: 1 %
Eosinophils Absolute: 0.3 10*3/uL (ref 0.0–0.5)
Eosinophils Relative: 3 %
HCT: 42.7 % (ref 36.0–46.0)
Hemoglobin: 13.1 g/dL (ref 12.0–15.0)
Immature Granulocytes: 1 %
Lymphocytes Relative: 20 %
Lymphs Abs: 1.9 10*3/uL (ref 0.7–4.0)
MCH: 25.6 pg — ABNORMAL LOW (ref 26.0–34.0)
MCHC: 30.7 g/dL (ref 30.0–36.0)
MCV: 83.4 fL (ref 80.0–100.0)
Monocytes Absolute: 0.4 10*3/uL (ref 0.1–1.0)
Monocytes Relative: 5 %
Neutro Abs: 6.6 10*3/uL (ref 1.7–7.7)
Neutrophils Relative %: 70 %
Platelets: 151 10*3/uL (ref 150–400)
RBC: 5.12 MIL/uL — ABNORMAL HIGH (ref 3.87–5.11)
RDW: 16.9 % — ABNORMAL HIGH (ref 11.5–15.5)
WBC: 9.4 10*3/uL (ref 4.0–10.5)
nRBC: 0 % (ref 0.0–0.2)

## 2023-01-24 LAB — FOLATE: Folate: 5.5 ng/mL — ABNORMAL LOW (ref >5.9)

## 2023-01-24 LAB — VITAMIN B12: Vitamin B-12: 650 pg/mL (ref 180–914)

## 2023-01-24 LAB — FERRITIN: Ferritin: 136 ng/mL (ref 11–307)

## 2023-01-24 NOTE — Progress Notes (Signed)
[facility] Cancer Follow up  Visit:  Patient Care Team: Ardith Dark, MD as PCP - General (Family Medicine) End, Cristal Deer, MD as PCP - Cardiology (Cardiology) Loni Muse, MD as Consulting Physician (Hematology)  CHIEF COMPLAINTS/PURPOSE OF CONSULTATION:  HISTORY OF PRESENTING ILLNESS: Melinda Porter 52 y.o. female is here because of thrombocytopenia Medical history is notable for arthritis, back pain, endometriosis, fatty liver, fibromyalgia, lupus, psoriatic arthritis sessile serrated colon polyps   October 15, 2021 CT abdomen pelvis with contrast.No focal hepatic lesion. Spleen measures 13.0 by 12.6 x 7.7 cm (volume = 660 cm^3). Minimal change from comparison exam. Lipase 269  October 23, 2021: IgG4 1  January 16, 2022: EGD  The esophagus was normal.  A few small inflammatory nodules in the antrum. The stomach was otherwise  normal.  Duodenum was normal. Colonoscopy  The terminal ileum appeared normal. Colon with patchy erythema throughout. Scarring in the region of the hepatic flexure,  again noted. Colon otherwise normal  February 04, 2022: WBC 9.9 hemoglobin 12.7 MCV 78 platelet count 130; 67 segs 22 lymphs 6 monos 4 eos 1 basophil  March 26, 2022: Right upper quadrant abdominal ultrasound.  Hepatic steatosis  April 05 2022:  [facility] Hematology Consult  No history of blood transfusion.  Skin rash on arms and legs.  Hard mobile nodules on arms have emerged over the last 2 months.    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  Tobacco none.  EtOH none  FMH Mother alive 40 thyroid problems Father died 46's liver disease Sister alive 56 autoimmune disease  WBC 9.8 hemoglobin 12.9 MCV 79 count 125; 71 segs 18 lymphs 6 monos 4 eos 1 basophil morphology confirmed thrombocytopenia but also demonstrated ovalocytes Coombs test negative.  Cold agglutinin titer negative  Haptoglobin 199 SPEP with IEP showed no paraprotein serum free kappa  17.2 lambda 11.4 with a kappa lambda 1.51 IgG 668 IgA 87 IgM 113 INR 1.1 PTT 27 fibrinogen 453 Anticardiolipin antibody negative antibeta-2 glycoprotein negative Ferritin 87 folate 9.9 B12 629 Hepatitis ABC serologies negative HIV negative  CMP notable for AST of 14  April 19 2022:  Scheduled follow up for thrombocytopenia.  Reviewed results of labs with patient.   Patient updated her [facility] Sister alive 62 and has ITP and Sjogren's syndrome.  PLT averages 85K.   Takes iron bid.  Had ice pica but not currently.     U/S of skin nodules Complex cystic appearing lesions in the subcutaneous fat potentially some type of complex ganglion cyst. MRI without and with contrast suggested for further evaluation.  July 18 2022:  Bilateral diagnostic mammogram Birads 1.  U.S left breast showing normal appearing fibrofatty tissue in the area of concern.  Sep 06 2022:    Reviewed results of imaging with patient.  She would like to be seen at [facility] Rheumatology.  Recommended the referral be made by PCP.  No bleeding issues except for gingival bleeding.  Has not seen dentist recently; had appointment but had to reschedule because of work commitment.  (Dental issues not unusual for Sjogrens and likely accounts for gingival bleeding)  Recently diagnosed with macular degeneration L> R and undergoing injections.  Taking oral iron bid and tolerating it despite ulcerative colitis.    Ferritin 84    January 24 2023:  Scheduled follow up for thrombocytopenia.  No weight change.  Notes that skin nodules have improved since last visit.  To see  Rheumatology at [facility].   Having issues with hypoglycemia (has been reported with Sjogren's syndrome.  Taking oral iron bid.  UC is at baseline- has diarrheal stools about 4 to 5 times daily.  Does not note that diarrhea is worsened by oral iron.   Ferritin 136.  Folate 5.5 B12 650 hemoglobin 13.1   Review of Systems  Constitutional:  Positive for fatigue.  Negative for appetite change, chills and fever.       Has gingival bleeding. Notes dried blood in AM when wakes up.  Bleeds when brushes teeth.  Last dental visit was "awhile"  Has dry mouth due to Sjogrens syndrome Weight fluctuates  HENT:   Positive for mouth sores and nosebleeds. Negative for hearing loss, lump/mass, sore throat, trouble swallowing and voice change.        Epistaxis not significant enough to cause her to go to ED  Eyes:  Negative for eye problems and icterus.       Vision changes:  None  Respiratory:  Negative for chest tightness, cough, hemoptysis and wheezing.        DOE:  Walking in a hurry  Cardiovascular:  Positive for leg swelling. Negative for palpitations.       Chest discomfort if cleaning the house  Gastrointestinal:  Positive for diarrhea and nausea. Negative for blood in stool, constipation and vomiting.       Periodic abdominal pain when has ulcerative colitis flare  Endocrine: Negative for hot flashes.       Cold intolerance:  none Heat intolerance:  none  Genitourinary:  Negative for bladder incontinence, difficulty urinating, dysuria, frequency, hematuria and nocturia.   Musculoskeletal:  Positive for arthralgias, back pain and myalgias.  Skin:  Positive for itching and rash. Negative for wound.       Rashes owing to psoriasis and lupus  Neurological:  Positive for dizziness, headaches and numbness. Negative for extremity weakness, light-headedness and speech difficulty.  Hematological:  Positive for adenopathy. Bruises/bleeds easily.  Psychiatric/Behavioral:  Negative for suicidal ideas. The patient is not nervous/anxious.        Does not sleep well at night    MEDICAL HISTORY: Past Medical History:  Diagnosis Date   Anemia    Anxiety    Arthritis    Back pain    Bilateral swelling of feet    Cataract    not a surgical candidate at this time (08/22/2022)   Depression    Endometriosis    Fatty liver    Fibromyalgia    GERD (gastroesophageal  reflux disease)    Hiatal hernia    Hyperlipemia    Hypertension    IBS (irritable bowel syndrome)    Joint pain    Lupus    Macular degeneration of both eyes    PONV (postoperative nausea and vomiting)    Psoriatic arthritis (HCC)    PTSD (post-traumatic stress disorder)    Seasonal allergies    Sjogren's disease (HCC)    Sleep-disordered breathing 11/12/2021   UC (ulcerative colitis) (HCC)    Vitamin D deficiency     SURGICAL HISTORY: Past Surgical History:  Procedure Laterality Date   CESAREAN SECTION  1997   COLONOSCOPY  2021   JP-MAC-suprep(exc)-patchy erythema otherwise normal - recall 5 years   FLEXIBLE SIGMOIDOSCOPY N/A 01/06/2017   Procedure: FLEXIBLE SIGMOIDOSCOPY;  Surgeon: Benancio Deeds, MD;  Location: [facility] ENDOSCOPY;  Service: Gastroenterology;  Laterality: N/A;   MOLE REMOVAL     Right forearm  OOPHORECTOMY Bilateral    PARTIAL HYSTERECTOMY  07/2009   TEMPOROMANDIBULAR JOINT SURGERY Right 2009   TONSILLECTOMY  2009    SOCIAL HISTORY: Social History   Socioeconomic History   Marital status: Widowed    Spouse name: Not on file   Number of children: 1   Years of education: Not on file   Highest education level: Not on file  Occupational History   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   Social Determinants of Health   Financial Resource Strain: Not on file  Food Insecurity: Not on file  Transportation Needs: Not on file  Physical Activity: Not on file  Stress: Not  on file  Social Connections: Not on file  Intimate Partner Violence: Not on file    FAMILY HISTORY Family History  Problem Relation Age of Onset   Colon polyps Maternal Grandmother    Lung cancer Maternal Grandmother    Liver disease Father        agent orange   Alcoholism Father    Thyroid disease Mother    Diabetes Maternal Grandfather    Heart disease Maternal Grandfather    Hypertension Maternal Grandfather    Colon cancer Maternal Uncle        great uncle   Breast cancer Neg Hx    Esophageal cancer Neg Hx    Stomach cancer Neg Hx    Rectal cancer Neg Hx     ALLERGIES:  is allergic to lyrica [pregabalin], sulfa antibiotics, ciprofloxacin, gabapentin, morphine sulfate, nsaids, prednisone, remicade [infliximab], tolmetin, codeine, humira [adalimumab], morphine and codeine, and penicillins.  MEDICATIONS:  Current Outpatient Medications  Medication Sig Dispense Refill   Apremilast 30 MG TABS Take 1 tablet by mouth in the morning and at bedtime.     Ascorbic Acid (VITA-C PO) Take 3 tablets by mouth daily at 6 (six) AM. 3 gummies a day     celecoxib (CELEBREX) 200 MG capsule Take 1 capsule (200 mg total) by mouth 2 (two) times daily. 60 capsule 3   clonazePAM (KLONOPIN) 1 MG tablet Take 1 tablet (1 mg total) by mouth 3 (three) times daily as needed for anxiety. 270 tablet 1   cromolyn (OPTICROM) 4 % ophthalmic solution Place 1 drop into both eyes 4 (four) times daily. (Patient taking differently: Place 1 drop into both eyes 4 (four) times daily as needed.) 10 mL 1   cyclobenzaprine (FLEXERIL) 10 MG tablet Take 1-2 tablets (10-20 mg total) by mouth 3 (three) times daily as needed for muscle spasms. 60 tablet 0   diphenoxylate-atropine (LOMOTIL) 2.5-0.025 MG tablet Take 1 tablet by mouth 2 (two) times daily as needed for diarrhea or loose stools. 60 tablet 2   doxycycline (VIBRA-TABS) 100 MG tablet Take 1 tablet (100 mg total) by mouth 2 (two) times daily. 20 tablet 0   ferrous  sulfate 324 (65 Fe) MG TBEC TAKE 1 TABLET(324 MG) BY MOUTH IN THE  MORNING AND AT BEDTIME 180 tablet 1   fluticasone (FLONASE) 50 MCG/ACT nasal spray Place 2 sprays into both nostrils daily. 48 g 0   glucose 4 GM chewable tablet Chew 1 tablet (4 g total) by mouth as needed for low blood sugar. 50 tablet 1   halobetasol (ULTRAVATE) 0.05 % cream Apply 1 Application topically daily as needed.     losartan (COZAAR) 100 MG tablet TAKE 1 TABLET(100 MG) BY MOUTH DAILY 30 tablet 1   meclizine (ANTIVERT) 25 MG tablet Take 1 tablet (25 mg total) by mouth 3 (three) times daily as needed for dizziness. 30 tablet 0   mesalamine (LIALDA) 1.2 g EC tablet TAKE 4 TABLETS(4.8 GRAMS) BY MOUTH DAILY 360 tablet 3   ondansetron (ZOFRAN) 8 MG tablet Take 1 tablet (8 mg total) by mouth every 8 (eight) hours as needed for nausea or vomiting. 20 tablet 0   ORENCIA CLICKJECT 125 MG/ML SOAJ Inject 1 Syringe into the skin once a week.     pantoprazole (PROTONIX) 40 MG tablet Take 1 tablet (40 mg total) by mouth daily. 90 tablet 3   Potassium 95 MG TABS Take 1 tablet by mouth daily at 6 (six) AM.     promethazine (PHENERGAN) 25 MG tablet 1 tablet as needed     sertraline (ZOLOFT) 100 MG tablet Take 1.5 tablets (150 mg total) by mouth daily. 135 tablet 3   Sodium Sulfate-Mag Sulfate-KCl (SUTAB) 805 108 6796 MG TABS Take 1 tablet by mouth as directed. BIN: [id number]  PCN: [id number]  GROUP: [id number]  MEMBER ID: [id number] 24 tablet 0   topiramate (TOPAMAX) 100 MG tablet Take 1 tablet (100 mg total) by mouth 2 (two) times daily. TAKE 1 TABLET(100 MG) BY MOUTH TWICE DAILY. 180 tablet 3   valACYclovir (VALTREX) 1000 MG tablet Take 2 tablets (2,000 mg total) by mouth as needed. For two doses for cold sores. 20 tablet 5   Vitamin D, Ergocalciferol, (DRISDOL) 1.25 MG (50000 UNIT) CAPS capsule Take 1 capsule (50,000 Units total) by mouth every 7 (seven) days. 12 capsule 3   zinc gluconate 50 MG tablet Take 50 mg by mouth daily.     No current  facility-administered medications for this visit.    PHYSICAL EXAMINATION:  ECOG PERFORMANCE STATUS: 1 - Symptomatic but completely ambulatory   Vitals:   01/24/23 1114  BP: (!) 145/90  Pulse: 86  Resp: 14  Temp: 98.1 F (36.7 C)  SpO2: 98%    Filed Weights   01/24/23 1114  Weight: 245 lb (111.1 kg)     Physical Exam Vitals and nursing note reviewed.  Constitutional:      General: She is not in acute distress.    Appearance: Normal appearance. She is obese. She is not ill-appearing, toxic-appearing or diaphoretic.     Comments: Here alone  HENT:     Head: Normocephalic and atraumatic.     Right Ear: External ear normal.     Left Ear: External ear normal.     Nose: Nose normal. No congestion or rhinorrhea.  Eyes:     General: No scleral icterus.    Extraocular Movements: Extraocular movements intact.     Conjunctiva/sclera: Conjunctivae normal.     Pupils: Pupils are equal, round, and reactive to light.  Cardiovascular:     Rate and Rhythm: Normal rate and regular rhythm.     Heart sounds: Normal heart sounds. No murmur heard.    No friction rub. No gallop.  Pulmonary:  Effort: Pulmonary effort is normal. No respiratory distress.     Breath sounds: Normal breath sounds. No stridor. No wheezing or rhonchi.  Abdominal:     General: Bowel sounds are normal.     Palpations: Abdomen is soft.     Tenderness: There is no abdominal tenderness. There is no guarding or rebound.  Musculoskeletal:        General: No swelling, tenderness or deformity.     Cervical back: Normal range of motion and neck supple. No rigidity or tenderness.  Lymphadenopathy:     Head:     Right side of head: No submental, submandibular, tonsillar, preauricular, posterior auricular or occipital adenopathy.     Left side of head: No submental, submandibular, tonsillar, preauricular, posterior auricular or occipital adenopathy.     Cervical: No cervical adenopathy.     Right cervical: No  superficial, deep or posterior cervical adenopathy.    Left cervical: No superficial, deep or posterior cervical adenopathy.     Upper Body:     Right upper body: No supraclavicular, axillary, pectoral or epitrochlear adenopathy.     Left upper body: No supraclavicular, axillary, pectoral or epitrochlear adenopathy.  Skin:    General: Skin is warm.     Coloration: Skin is not jaundiced.     Comments: Follicular rash on extensor surfaces of arms. Firm subcutaneous, mobile nodules on dorsum of forearms   Neurological:     General: No focal deficit present.     Mental Status: She is alert and oriented to person, place, and time.     Cranial Nerves: No cranial nerve deficit.  Psychiatric:        Mood and Affect: Mood normal.        Behavior: Behavior normal.        Thought Content: Thought content normal.        Judgment: Judgment normal.     LABORATORY DATA: I have personally reviewed the data as listed:  No visits with results within 1 Month(s) from this visit.  Latest known visit with results is:  Office Visit on 09/20/2022  Component Date Value Ref Range Status   Hgb A1c MFr Bld 09/20/2022 5.9  4.6 - 6.5 % Final   Glycemic Control Guidelines for People with Diabetes:Non Diabetic:  <6%Goal of Therapy: <7%Additional Action Suggested:  >8%    Cholesterol 09/20/2022 190  0 - 200 mg/dL Final   ATP III Classification       Desirable:  < 200 mg/dL               Borderline High:  200 - 239 mg/dL          High:  > = 161 mg/dL   Triglycerides 09/60/4540 468.0 Triglyceride is over 400; calculations on Lipids are invalid. (H)  0.0 - 149.0 mg/dL Final   Normal:  <981 mg/dLBorderline High:  150 - 199 mg/dL   HDL 19/14/7829 56.21 (L)  >30.86 mg/dL Final   Total CHOL/HDL Ratio 09/20/2022 6   Final                  Men          Women1/2 Average Risk     3.4          3.3Average Risk          5.0          4.42X Average Risk          9.6  7.13X Average Risk          15.0          11.0                        TSH 09/20/2022 3.25  0.35 - 5.50 uIU/mL Final   Vitamin B-12 09/20/2022 711  211 - 911 pg/mL Final   VITD 09/20/2022 46.84  30.00 - 100.00 ng/mL Final   Direct LDL 09/20/2022 106.0  mg/dL Final   Optimal:  <161 mg/dLNear or Above Optimal:  100-129 mg/dLBorderline High:  130-159 mg/dLHigh:  160-189 mg/dLVery High:  >190 mg/dL    RADIOGRAPHIC STUDIES: I have personally reviewed the radiological images as listed and agree with the findings in the report  No results found.  ASSESSMENT/PLAN  52 y.o. female with thrombocytopenia Medical history is notable for inflammatory arthritis (lupus, psoriatic arthritis),  back pain, endometriosis, fatty liver, fibromyalgia,  sessile serrated colon polyps  Thrombocytopenia:  Mild and without bleeding complications.  Most likely causes in this patient are autoimmune syndromes, inflammation, splenomegaly, hepatic steatosis.   Sep 06 2022- Has mild gingival bleeding more likely related to xerostomia from Sjogren's than thrombocytopenia.  Can therefore hold on tranexamic acid or amicar mouthwash January 24 2023- PLT 151  Iron deficiency:  Likely secondary to chronic GI blood loss from ulcerative colitis January 16, 2022: EGD  The esophagus was normal.  A few small inflammatory nodules in the antrum. The stomach was otherwise  normal.  Duodenum was normal. Colonoscopy  The terminal ileum appeared normal. Colon with patchy erythema throughout. Scarring in the region of the hepatic flexure,  again noted. Colon otherwise normal  Sep 06 2022- Tolerating oral iron.  Ferritin adequate.   January 24 2023- Ferritin 136. Folate 5.5 B12 650 hemoglobin 13.1.  No need for IV iron.  Tolerating oral iron bid.  To begin folic acid  Skin nodules:  April 05 2022:  U/S of skin nodules Complex cystic appearing lesions in the subcutaneous fat potentially some type of complex ganglion cyst. MRI without and with contrast suggested for further evaluation.   Sep 06 2022- Since otherwise asymptomatic recommended against further evaluation at this time January 24 2023- Improved since last visit  Sjogren syndrome  January 24 2023- Has upcoming visit at [facility].  Hypoglycemia has been reported with Sjogren's    Cancer Staging  No matching staging information was found for the patient.    No problem-specific Assessment & Plan notes found for this encounter.   No orders of the defined types were placed in this encounter.   30  minutes was spent in patient care.  This included time spent preparing to see the patient (e.g., review of tests), obtaining and/or reviewing separately obtained history, counseling and educating the patient, ordering medications, tests/ procedures; documenting clinical information in the electronic or other health record, independently interpreting results and communicating results to the patient as well as coordination of care.        All questions were answered. The patient knows to call the clinic with any problems, questions or concerns.  This note was electronically signed.    Loni Muse, MD  01/24/2023 11:19 AM

## 2023-02-03 ENCOUNTER — Telehealth: Payer: Self-pay | Admitting: Family Medicine

## 2023-02-03 ENCOUNTER — Other Ambulatory Visit: Payer: Self-pay | Admitting: *Deleted

## 2023-02-03 ENCOUNTER — Other Ambulatory Visit: Payer: Self-pay

## 2023-02-03 DIAGNOSIS — I10 Essential (primary) hypertension: Secondary | ICD-10-CM

## 2023-02-03 MED ORDER — LOSARTAN POTASSIUM 100 MG PO TABS: ORAL_TABLET | ORAL | 1 refills | Status: DC

## 2023-02-03 MED ORDER — FOLIC ACID 1 MG PO TABS: 1.0000 mg | ORAL_TABLET | Freq: Every day | ORAL | Status: AC

## 2023-02-03 NOTE — Telephone Encounter (Signed)
ALL MEDS SHOULD BE 90 DAYS    Prescription Request  02/03/2023  LOV: 09/20/2022  What is the name of the medication or equipment?  losartan (COZAAR) 100 MG tablet    Have you contacted your pharmacy to request a refill? No   Which pharmacy would you like this sent to?  [id number] [place], [place] - [address] AT [facility] OF [facility] [address] & [facility] [address] [place] [address] Phone: [phone] Fax: [phone]    Patient notified that their request is being sent to the clinical staff for review and that they should receive a response within 2 business days.   Please advise at Mobile [phone] (mobile)

## 2023-02-03 NOTE — Telephone Encounter (Signed)
Rx send to pharmacy  

## 2023-02-11 ENCOUNTER — Ambulatory Visit: Payer: Commercial Managed Care - PPO

## 2023-02-11 ENCOUNTER — Other Ambulatory Visit: Payer: Commercial Managed Care - PPO

## 2023-02-21 ENCOUNTER — Ambulatory Visit (AMBULATORY_SURGERY_CENTER): Payer: Commercial Managed Care - PPO

## 2023-02-21 ENCOUNTER — Other Ambulatory Visit: Payer: Self-pay | Admitting: *Deleted

## 2023-02-21 ENCOUNTER — Ambulatory Visit: Payer: Commercial Managed Care - PPO | Admitting: Family

## 2023-02-21 ENCOUNTER — Encounter: Payer: Self-pay | Admitting: Family

## 2023-02-21 VITALS — Ht 64.0 in | Wt 244.0 lb

## 2023-02-21 VITALS — BP 119/78 | HR 102 | Temp 98.0°F | Ht 64.0 in | Wt 247.0 lb

## 2023-02-21 DIAGNOSIS — M3219 Other organ or system involvement in systemic lupus erythematosus: Secondary | ICD-10-CM

## 2023-02-21 DIAGNOSIS — K51919 Ulcerative colitis, unspecified with unspecified complications: Secondary | ICD-10-CM

## 2023-02-21 DIAGNOSIS — L4050 Arthropathic psoriasis, unspecified: Secondary | ICD-10-CM

## 2023-02-21 DIAGNOSIS — J309 Allergic rhinitis, unspecified: Secondary | ICD-10-CM

## 2023-02-21 DIAGNOSIS — Z860100 Personal history of colon polyps, unspecified: Secondary | ICD-10-CM

## 2023-02-21 DIAGNOSIS — L739 Follicular disorder, unspecified: Secondary | ICD-10-CM | POA: Diagnosis not present

## 2023-02-21 MED ORDER — TRIAMCINOLONE ACETONIDE 55 MCG/ACT NA AERO
1.0000 | INHALATION_SPRAY | Freq: Every day | NASAL | 2 refills | Status: AC
Start: 2023-02-21 — End: ?

## 2023-02-21 MED ORDER — MUPIROCIN CALCIUM 2 % EX CREA
1.0000 | TOPICAL_CREAM | Freq: Two times a day (BID) | CUTANEOUS | 0 refills | Status: DC
Start: 2023-02-21 — End: 2023-03-11

## 2023-02-21 NOTE — Progress Notes (Signed)
No egg or soy allergy known to patient  PONV per patient scope patch is typically given prior to sedation  Patient denies ever being told they had issues or difficulty with intubation  No FH of Malignant Hyperthermia Pt is not on diet pills Pt is not on  home 02  Pt is not on blood thinners  Pt denies issues with constipation  No A fib or A flutter Have any cardiac testing pending--no  LOA: independent  Prep: sutab   Patient's chart reviewed by Cathlyn Parsons CNRA prior to previsit and patient appropriate for the [facility].  Previsit completed and red dot placed by patient's name on their procedure day (on provider's schedule).     PV competed with patient. Prep instructions sent via mychart and home address. Patient has sutab rx at home.

## 2023-02-21 NOTE — Progress Notes (Signed)
Patient ID: Melinda Porter, female    DOB: 1970/12/17, 52 y.o.   MRN: [id number]  Chief Complaint  Patient presents with   Pressure Ulcer    Pt c/o pressure sore, Present off and on for 2 months that has been getting worse. Pt states she sits at work for over 10 hours a day.    Dizziness    Pt c/o dizziness, nasal congestion and slight headache. Present for a month. Pt was given abx on 10/6 for sinus infection, which did not help sx.      Discussed the use of AI scribe software for clinical note transcription with the patient, who gave verbal consent to proceed.  History of Present Illness   The patient, with a history of sinus issues, presents with ongoing sinus pressure and congestion, vertigo, and ear pressure. She reports that these symptoms have been ongoing for at least a month and have not improved despite a 10-day course of antibiotics. She also uses Flonase twice daily, which she has been on for a couple of years. She reports that the vertigo episodes usually last for a day and are often triggered by changes in the atmosphere.  Lastly, she reports discomfort in her buttocks, which she attributes to sitting for long periods due to her work. She has tried various cushions and pillows to alleviate the discomfort, but with limited success. She also has a sore in the buttock area, which is tender, denies any burning and is unsure if skin is broken or not.        Assessment & Plan:     Chronic Sinusitis - Persistent sinus pressure and congestion for over a month, unresponsive to a 10-day course of antibiotics. Symptoms include ear pressure and vertigo, exacerbated by weather changes. Current treatment with fluticasone has been ineffective. Discussed switching to triamcinolone nasal spray. Pt has Sjogren's and can't use antihistamines. Discussed using naproxen (pt has at home) for its anti-inflammatory effects to alleviate pressure as well. - Discontinue fluticasone. - Prescribing  triamcinolone nasal spray, use 1 spray each side bid. - Recommend naproxen for anti-inflammatory effects, 1-2 tabs bid for next 3-4d. - Advised on use of a humidifier at night  Folliculitis - Exam revealed 3 small pimples in cluster on right inner buttock.  Advised pt to clean area daily with soap & water, dry well, then apply a thin layer of RX ointment bid.  - Prescribe mupirocin ointment 2%, use bid. - Advise daily cleaning with soap and water, followed by thorough drying - Apply a thin layer of mupirocin ointment to avoid excessive moisture on the skin. - RTO precautions given.  Sacral Pain - Experiences discomfort from prolonged sitting due to work requirements. Sacral pain noted with palpation. Discussed using a horseshoe-shaped cushion for sacral support and the importance of taking breaks every hour to stretch and move. Suggested visiting a medical store to explore cushion options, as these are for medical pressure relief vs just comfort. - Recommend use of a horseshoe-shaped cushion for sacral support - Encourage taking breaks every hour to stretch and move - Suggest visiting a medical store to explore additional cushion options     Subjective:    Outpatient Medications Prior to Visit  Medication Sig Dispense Refill   Apremilast 30 MG TABS Take 1 tablet by mouth in the morning and at bedtime.     Ascorbic Acid (VITA-C PO) Take 3 tablets by mouth daily at 6 (six) AM. 3 gummies a day  celecoxib (CELEBREX) 200 MG capsule Take 1 capsule (200 mg total) by mouth 2 (two) times daily. 60 capsule 3   clonazePAM (KLONOPIN) 1 MG tablet Take 1 tablet (1 mg total) by mouth 3 (three) times daily as needed for anxiety. 270 tablet 1   cromolyn (OPTICROM) 4 % ophthalmic solution Place 1 drop into both eyes 4 (four) times daily. 10 mL 1   cyclobenzaprine (FLEXERIL) 10 MG tablet Take 1-2 tablets (10-20 mg total) by mouth 3 (three) times daily as needed for muscle spasms. 60 tablet 0    diphenoxylate-atropine (LOMOTIL) 2.5-0.025 MG tablet Take 1 tablet by mouth 2 (two) times daily as needed for diarrhea or loose stools. 60 tablet 2   ferrous sulfate 324 (65 Fe) MG TBEC TAKE 1 TABLET(324 MG) BY MOUTH IN THE MORNING AND AT BEDTIME 180 tablet 1   fluticasone (FLONASE) 50 MCG/ACT nasal spray Place 2 sprays into both nostrils daily. 48 g 0   folic acid (FOLVITE) 1 MG tablet Take 1 tablet (1 mg total) by mouth daily.     glucose 4 GM chewable tablet Chew 1 tablet (4 g total) by mouth as needed for low blood sugar. 50 tablet 1   halobetasol (ULTRAVATE) 0.05 % cream Apply 1 Application topically daily as needed.     losartan (COZAAR) 100 MG tablet TAKE 1 TABLET(100 MG) BY MOUTH DAILY 90 tablet 1   meclizine (ANTIVERT) 25 MG tablet Take 1 tablet (25 mg total) by mouth 3 (three) times daily as needed for dizziness. 30 tablet 0   mesalamine (LIALDA) 1.2 g EC tablet TAKE 4 TABLETS(4.8 GRAMS) BY MOUTH DAILY 360 tablet 3   ondansetron (ZOFRAN) 8 MG tablet Take 1 tablet (8 mg total) by mouth every 8 (eight) hours as needed for nausea or vomiting. 20 tablet 0   ORENCIA CLICKJECT 125 MG/ML SOAJ Inject 1 Syringe into the skin once a week.     pantoprazole (PROTONIX) 40 MG tablet Take 1 tablet (40 mg total) by mouth daily. 90 tablet 3   Potassium 95 MG TABS Take 1 tablet by mouth daily at 6 (six) AM.     promethazine (PHENERGAN) 25 MG tablet      sertraline (ZOLOFT) 100 MG tablet Take 1.5 tablets (150 mg total) by mouth daily. 135 tablet 3   Sodium Sulfate-Mag Sulfate-KCl (SUTAB) [phone] MG TABS Take 1 tablet by mouth as directed. BIN: [id number]  PCN: [id number]  GROUP: [id number]  MEMBER ID: [id number] 24 tablet 0   topiramate (TOPAMAX) 100 MG tablet Take 1 tablet (100 mg total) by mouth 2 (two) times daily. TAKE 1 TABLET(100 MG) BY MOUTH TWICE DAILY. 180 tablet 3   valACYclovir (VALTREX) 1000 MG tablet Take 2 tablets (2,000 mg total) by mouth as needed. For two doses for cold sores. 20 tablet 5    Vitamin D, Ergocalciferol, (DRISDOL) 1.25 MG (50000 UNIT) CAPS capsule Take 1 capsule (50,000 Units total) by mouth every 7 (seven) days. 12 capsule 3   zinc gluconate 50 MG tablet Take 50 mg by mouth daily.     doxycycline (VIBRA-TABS) 100 MG tablet Take 1 tablet (100 mg total) by mouth 2 (two) times daily. (Patient not taking: Reported on 02/21/2023) 20 tablet 0   No facility-administered medications prior to visit.   Past Medical History:  Diagnosis Date   Anemia    Anxiety    Arthritis    Back pain    Bilateral swelling of feet    Cataract    not  a surgical candidate at this time (08/22/2022)   Depression    Endometriosis    Fatty liver    Fibromyalgia    GERD (gastroesophageal reflux disease)    Hiatal hernia    Hyperlipemia    Hypertension    IBS (irritable bowel syndrome)    Joint pain    Lupus    Macular degeneration of both eyes    PONV (postoperative nausea and vomiting)    Psoriatic arthritis (HCC)    PTSD (post-traumatic stress disorder)    Seasonal allergies    Sjogren's disease (HCC)    Sleep-disordered breathing 11/12/2021   UC (ulcerative colitis) (HCC)    Vitamin D deficiency    Past Surgical History:  Procedure Laterality Date   CESAREAN SECTION  1997   COLONOSCOPY  2021   JP-MAC-suprep(exc)-patchy erythema otherwise normal - recall 5 years   FLEXIBLE SIGMOIDOSCOPY N/A 01/06/2017   Procedure: FLEXIBLE SIGMOIDOSCOPY;  Surgeon: Benancio Deeds, MD;  Location: [facility];  Service: Gastroenterology;  Laterality: N/A;   MOLE REMOVAL     Right forearm   OOPHORECTOMY Bilateral    PARTIAL HYSTERECTOMY  07/2009   TEMPOROMANDIBULAR JOINT SURGERY Right 2009   TONSILLECTOMY  2009   Allergies  Allergen Reactions   Lyrica [Pregabalin] Shortness Of Breath   Sulfa Antibiotics Rash and Other (See Comments)   Ciprofloxacin Nausea Only   Gabapentin Other (See Comments)    Stroke like symptoms   Morphine Sulfate     Other reaction(s): Unknown   Nsaids  Other (See Comments)    Liver problems- can't take tylenol, ibuprofen etc.   Prednisone Other (See Comments)    "I get deathly ill"   Remicade [Infliximab]    Tolmetin     Other reaction(s): Other (See Comments) Liver problems- can't take tylenol, ibuprofen etc.   Codeine Rash    Other reaction(s): Unknown   Humira [Adalimumab] Rash    Worsening of Lupus rash.   Morphine And Codeine Rash    "Wired up"   Penicillins Rash    Has patient had a PCN reaction causing immediate rash, facial/tongue/throat swelling, SOB or lightheadedness with hypotension: NO Has patient had a PCN reaction causing severe rash involving mucus membranes or skin necrosis: NO Has patient had a PCN reaction that required hospitalization NO Has patient had a PCN reaction occurring within the last 10 years: NO If all of the above answers are "NO", then may proceed with Cephalosporin use.      Objective:    Physical Exam Vitals and nursing note reviewed.  Constitutional:      Appearance: Normal appearance. She is ill-appearing.     Interventions: Face mask in place.  HENT:     Right Ear: Tympanic membrane and ear canal normal.     Left Ear: Tympanic membrane and ear canal normal.     Nose: Congestion present.     Right Sinus: Frontal sinus tenderness present.     Left Sinus: Frontal sinus tenderness present.     Mouth/Throat:     Mouth: Mucous membranes are moist.     Pharynx: No pharyngeal swelling, oropharyngeal exudate, posterior oropharyngeal erythema or uvula swelling.     Tonsils: No tonsillar exudate or tonsillar abscesses.  Cardiovascular:     Rate and Rhythm: Normal rate and regular rhythm.  Pulmonary:     Effort: Pulmonary effort is normal.     Breath sounds: Normal breath sounds.  Musculoskeletal:        General: Normal range  of motion.     Lumbar back: Bony tenderness (on coccyx and sacral region) present.  Lymphadenopathy:     Head:     Right side of head: No preauricular or posterior  auricular adenopathy.     Left side of head: No preauricular or posterior auricular adenopathy.     Cervical: No cervical adenopathy.  Skin:    General: Skin is warm and dry.     Findings: Rash present. Pustular rash: cluster of 3 small pus-filled pimples on right inner buttock, non-inflamed.  Neurological:     Mental Status: She is alert.  Psychiatric:        Mood and Affect: Mood normal.        Behavior: Behavior normal.    BP 119/78 (BP Location: Left Arm, Patient Position: Sitting, Cuff Size: Normal)   Pulse (!) 102   Temp 98 F (36.7 C) (Temporal)   Ht 5\' 4"  (1.626 m)   Wt 247 lb (112 kg)   LMP 06/23/2009   SpO2 95%   BMI 42.40 kg/m  Wt Readings from Last 3 Encounters:  02/21/23 247 lb (112 kg)  02/21/23 244 lb (110.7 kg)  01/24/23 245 lb (111.1 kg)      Dulce Sellar, NP

## 2023-03-11 ENCOUNTER — Telehealth: Payer: Self-pay

## 2023-03-11 ENCOUNTER — Other Ambulatory Visit (HOSPITAL_COMMUNITY): Payer: Self-pay

## 2023-03-11 DIAGNOSIS — L739 Follicular disorder, unspecified: Secondary | ICD-10-CM

## 2023-03-11 MED ORDER — MUPIROCIN 2 % EX OINT: 1.0000 | TOPICAL_OINTMENT | Freq: Two times a day (BID) | CUTANEOUS | 0 refills | Status: AC

## 2023-03-11 NOTE — Telephone Encounter (Signed)
Pharmacy Patient Advocate Encounter   Received notification from CoverMyMeds that prior authorization for Mupirocin Calcium 2% cream is required/requested.   Insurance verification completed.   The patient is insured through [employer] .   Per test claim:  mupirocin ointment is preferred by the insurance.  If suggested medication is appropriate, Please send in a new RX and discontinue this one. If not, please advise as to why it's not appropriate so that we may request a Prior Authorization. Please note, some preferred medications may still require a PA    Key: [id number]

## 2023-03-11 NOTE — Telephone Encounter (Signed)
I have resent the RX for the ointment

## 2023-03-21 ENCOUNTER — Encounter: Payer: Commercial Managed Care - PPO | Admitting: Internal Medicine

## 2023-03-28 ENCOUNTER — Telehealth: Payer: Self-pay | Admitting: Oncology

## 2023-03-28 NOTE — Telephone Encounter (Signed)
Patient has been scheduled for a follow up to see Dr. Angelene Giovanni.. Aware of appt date, time and location.

## 2023-04-01 ENCOUNTER — Other Ambulatory Visit: Payer: Self-pay | Admitting: Family Medicine

## 2023-04-01 DIAGNOSIS — F419 Anxiety disorder, unspecified: Secondary | ICD-10-CM

## 2023-04-11 ENCOUNTER — Inpatient Hospital Stay: Payer: Commercial Managed Care - PPO | Attending: Oncology | Admitting: Oncology

## 2023-04-11 ENCOUNTER — Inpatient Hospital Stay: Payer: Commercial Managed Care - PPO

## 2023-04-11 VITALS — BP 154/102 | HR 76 | Temp 98.1°F | Resp 18 | Ht 64.0 in | Wt 251.3 lb

## 2023-04-11 DIAGNOSIS — R21 Rash and other nonspecific skin eruption: Secondary | ICD-10-CM | POA: Diagnosis not present

## 2023-04-11 DIAGNOSIS — Z885 Allergy status to narcotic agent status: Secondary | ICD-10-CM | POA: Insufficient documentation

## 2023-04-11 DIAGNOSIS — R92313 Mammographic fatty tissue density, bilateral breasts: Secondary | ICD-10-CM | POA: Diagnosis not present

## 2023-04-11 DIAGNOSIS — E162 Hypoglycemia, unspecified: Secondary | ICD-10-CM | POA: Insufficient documentation

## 2023-04-11 DIAGNOSIS — K219 Gastro-esophageal reflux disease without esophagitis: Secondary | ICD-10-CM | POA: Diagnosis not present

## 2023-04-11 DIAGNOSIS — Z886 Allergy status to analgesic agent status: Secondary | ICD-10-CM | POA: Insufficient documentation

## 2023-04-11 DIAGNOSIS — M2550 Pain in unspecified joint: Secondary | ICD-10-CM | POA: Insufficient documentation

## 2023-04-11 DIAGNOSIS — F32A Depression, unspecified: Secondary | ICD-10-CM | POA: Insufficient documentation

## 2023-04-11 DIAGNOSIS — R42 Dizziness and giddiness: Secondary | ICD-10-CM | POA: Insufficient documentation

## 2023-04-11 DIAGNOSIS — E611 Iron deficiency: Secondary | ICD-10-CM | POA: Diagnosis not present

## 2023-04-11 DIAGNOSIS — D696 Thrombocytopenia, unspecified: Secondary | ICD-10-CM | POA: Insufficient documentation

## 2023-04-11 DIAGNOSIS — Z9089 Acquired absence of other organs: Secondary | ICD-10-CM | POA: Insufficient documentation

## 2023-04-11 DIAGNOSIS — Z800 Family history of malignant neoplasm of digestive organs: Secondary | ICD-10-CM | POA: Insufficient documentation

## 2023-04-11 DIAGNOSIS — M791 Myalgia, unspecified site: Secondary | ICD-10-CM | POA: Insufficient documentation

## 2023-04-11 DIAGNOSIS — Z882 Allergy status to sulfonamides status: Secondary | ICD-10-CM | POA: Insufficient documentation

## 2023-04-11 DIAGNOSIS — Z79899 Other long term (current) drug therapy: Secondary | ICD-10-CM | POA: Insufficient documentation

## 2023-04-11 DIAGNOSIS — Z8249 Family history of ischemic heart disease and other diseases of the circulatory system: Secondary | ICD-10-CM | POA: Insufficient documentation

## 2023-04-11 DIAGNOSIS — Z801 Family history of malignant neoplasm of trachea, bronchus and lung: Secondary | ICD-10-CM | POA: Insufficient documentation

## 2023-04-11 DIAGNOSIS — K760 Fatty (change of) liver, not elsewhere classified: Secondary | ICD-10-CM | POA: Diagnosis not present

## 2023-04-11 DIAGNOSIS — L4050 Arthropathic psoriasis, unspecified: Secondary | ICD-10-CM | POA: Insufficient documentation

## 2023-04-11 DIAGNOSIS — R200 Anesthesia of skin: Secondary | ICD-10-CM | POA: Insufficient documentation

## 2023-04-11 DIAGNOSIS — F419 Anxiety disorder, unspecified: Secondary | ICD-10-CM | POA: Insufficient documentation

## 2023-04-11 DIAGNOSIS — E785 Hyperlipidemia, unspecified: Secondary | ICD-10-CM | POA: Insufficient documentation

## 2023-04-11 DIAGNOSIS — R519 Headache, unspecified: Secondary | ICD-10-CM | POA: Insufficient documentation

## 2023-04-11 DIAGNOSIS — M329 Systemic lupus erythematosus, unspecified: Secondary | ICD-10-CM | POA: Insufficient documentation

## 2023-04-11 DIAGNOSIS — Z87891 Personal history of nicotine dependence: Secondary | ICD-10-CM | POA: Insufficient documentation

## 2023-04-11 DIAGNOSIS — Z832 Family history of diseases of the blood and blood-forming organs and certain disorders involving the immune mechanism: Secondary | ICD-10-CM | POA: Insufficient documentation

## 2023-04-11 DIAGNOSIS — D509 Iron deficiency anemia, unspecified: Secondary | ICD-10-CM

## 2023-04-11 DIAGNOSIS — Z8379 Family history of other diseases of the digestive system: Secondary | ICD-10-CM | POA: Insufficient documentation

## 2023-04-11 DIAGNOSIS — R110 Nausea: Secondary | ICD-10-CM | POA: Insufficient documentation

## 2023-04-11 DIAGNOSIS — Z881 Allergy status to other antibiotic agents status: Secondary | ICD-10-CM | POA: Insufficient documentation

## 2023-04-11 DIAGNOSIS — M549 Dorsalgia, unspecified: Secondary | ICD-10-CM | POA: Insufficient documentation

## 2023-04-11 DIAGNOSIS — Z5986 Financial insecurity: Secondary | ICD-10-CM | POA: Insufficient documentation

## 2023-04-11 DIAGNOSIS — Z8349 Family history of other endocrine, nutritional and metabolic diseases: Secondary | ICD-10-CM | POA: Insufficient documentation

## 2023-04-11 DIAGNOSIS — Z860100 Personal history of colon polyps, unspecified: Secondary | ICD-10-CM | POA: Insufficient documentation

## 2023-04-11 DIAGNOSIS — R197 Diarrhea, unspecified: Secondary | ICD-10-CM | POA: Insufficient documentation

## 2023-04-11 DIAGNOSIS — R5383 Other fatigue: Secondary | ICD-10-CM | POA: Insufficient documentation

## 2023-04-11 DIAGNOSIS — Z833 Family history of diabetes mellitus: Secondary | ICD-10-CM | POA: Insufficient documentation

## 2023-04-11 DIAGNOSIS — Z83719 Family history of colon polyps, unspecified: Secondary | ICD-10-CM | POA: Insufficient documentation

## 2023-04-11 DIAGNOSIS — Z888 Allergy status to other drugs, medicaments and biological substances status: Secondary | ICD-10-CM | POA: Insufficient documentation

## 2023-04-11 DIAGNOSIS — Z90722 Acquired absence of ovaries, bilateral: Secondary | ICD-10-CM | POA: Insufficient documentation

## 2023-04-11 DIAGNOSIS — I10 Essential (primary) hypertension: Secondary | ICD-10-CM | POA: Insufficient documentation

## 2023-04-11 DIAGNOSIS — Z880 Allergy status to penicillin: Secondary | ICD-10-CM | POA: Insufficient documentation

## 2023-04-11 DIAGNOSIS — M797 Fibromyalgia: Secondary | ICD-10-CM | POA: Diagnosis not present

## 2023-04-11 DIAGNOSIS — M3500 Sicca syndrome, unspecified: Secondary | ICD-10-CM | POA: Diagnosis not present

## 2023-04-11 LAB — VITAMIN B12: Vitamin B-12: 682 pg/mL (ref 180–914)

## 2023-04-11 LAB — CBC WITH DIFFERENTIAL/PLATELET
Abs Immature Granulocytes: 0.05 10*3/uL (ref 0.00–0.07)
Basophils Absolute: 0.2 10*3/uL — ABNORMAL HIGH (ref 0.0–0.1)
Basophils Relative: 2 %
Eosinophils Absolute: 0.3 10*3/uL (ref 0.0–0.5)
Eosinophils Relative: 4 %
HCT: 41.3 % (ref 36.0–46.0)
Hemoglobin: 13 g/dL (ref 12.0–15.0)
Immature Granulocytes: 1 %
Lymphocytes Relative: 23 %
Lymphs Abs: 2.2 10*3/uL (ref 0.7–4.0)
MCH: 25.2 pg — ABNORMAL LOW (ref 26.0–34.0)
MCHC: 31.5 g/dL (ref 30.0–36.0)
MCV: 80 fL (ref 80.0–100.0)
Monocytes Absolute: 0.4 10*3/uL (ref 0.1–1.0)
Monocytes Relative: 5 %
Neutro Abs: 6.2 10*3/uL (ref 1.7–7.7)
Neutrophils Relative %: 65 %
Platelets: 141 10*3/uL — ABNORMAL LOW (ref 150–400)
RBC: 5.16 MIL/uL — ABNORMAL HIGH (ref 3.87–5.11)
RDW: 16.3 % — ABNORMAL HIGH (ref 11.5–15.5)
WBC: 9.4 10*3/uL (ref 4.0–10.5)
nRBC: 0 % (ref 0.0–0.2)
nRBC: 0 /100{WBCs}

## 2023-04-11 LAB — FERRITIN: Ferritin: 133 ng/mL (ref 11–307)

## 2023-04-11 LAB — FOLATE: Folate: 14.5 ng/mL (ref >5.9)

## 2023-04-11 NOTE — Progress Notes (Signed)
[facility] Center Cancer Follow up  Visit:  Patient Care Team: Ardith Dark, MD as PCP - General (Family Medicine) End, Cristal Deer, MD as PCP - Cardiology (Cardiology) Loni Muse, MD as Consulting Physician (Hematology)  CHIEF COMPLAINTS/PURPOSE OF CONSULTATION:  HISTORY OF PRESENTING ILLNESS: Melinda Porter 52 y.o. female is here because of thrombocytopenia Medical history is notable for arthritis, back pain, endometriosis, fatty liver, fibromyalgia, lupus, psoriatic arthritis sessile serrated colon polyps   October 15, 2021 CT abdomen pelvis with contrast.No focal hepatic lesion. Spleen measures 13.0 by 12.6 x 7.7 cm (volume = 660 cm^3). Minimal change from comparison exam. Lipase 269  October 23, 2021: IgG4 1  January 16, 2022: EGD  The esophagus was normal.  A few small inflammatory nodules in the antrum. The stomach was otherwise  normal.  Duodenum was normal. Colonoscopy  The terminal ileum appeared normal. Colon with patchy erythema throughout. Scarring in the region of the hepatic flexure,  again noted. Colon otherwise normal  February 04, 2022: WBC 9.9 hemoglobin 12.7 MCV 78 platelet count 130; 67 segs 22 lymphs 6 monos 4 eos 1 basophil  March 26, 2022: Right upper quadrant abdominal ultrasound.  Hepatic steatosis  April 05 2022:  [facility] Hematology Consult  No history of blood transfusion.  Skin rash on arms and legs.  Hard mobile nodules on arms have emerged over the last 2 months.    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  Tobacco none.  EtOH none  FMH Mother alive 17 thyroid problems Father died 35's liver disease Sister alive 69 autoimmune disease  WBC 9.8 hemoglobin 12.9 MCV 79 count 125; 71 segs 18 lymphs 6 monos 4 eos 1 basophil morphology confirmed thrombocytopenia but also demonstrated ovalocytes Coombs test negative.  Cold agglutinin titer negative  Haptoglobin 199 SPEP with IEP showed no paraprotein serum free kappa  17.2 lambda 11.4 with a kappa lambda 1.51 IgG 668 IgA 87 IgM 113 INR 1.1 PTT 27 fibrinogen 453 Anticardiolipin antibody negative antibeta-2 glycoprotein negative Ferritin 87 folate 9.9 B12 629 Hepatitis ABC serologies negative HIV negative  CMP notable for AST of 14  April 19 2022:  Scheduled follow up for thrombocytopenia.  Reviewed results of labs with patient.   Patient updated her [facility] Sister alive 57 and has ITP and Sjogren's syndrome.  PLT averages 85K.   Takes iron bid.  Had ice pica but not currently.     U/S of skin nodules Complex cystic appearing lesions in the subcutaneous fat potentially some type of complex ganglion cyst. MRI without and with contrast suggested for further evaluation.  July 18 2022:  Bilateral diagnostic mammogram Birads 1.  U.S left breast showing normal appearing fibrofatty tissue in the area of concern.  Sep 06 2022:    Reviewed results of imaging with patient.  She would like to be seen at [facility] Rheumatology.  Recommended the referral be made by PCP.  No bleeding issues except for gingival bleeding.  Has not seen dentist recently; had appointment but had to reschedule because of work commitment.  (Dental issues not unusual for Sjogrens and likely accounts for gingival bleeding)  Recently diagnosed with macular degeneration L> R and undergoing injections.  Taking oral iron bid and tolerating it despite ulcerative colitis.    Ferritin 84    January 24 2023:    No weight change.  Notes that skin nodules have improved since last visit.  To see Rheumatology at [facility].   Having issues with hypoglycemia (has been reported with Sjogren's syndrome.  Taking oral iron bid.  UC is at baseline- has diarrheal stools about 4 to 5 times daily.  Does not note that diarrhea is worsened by oral iron.   Ferritin 136.  Folate 5.5 B12 650 hemoglobin 13.1  PLT 151   April 11 2023:  Scheduled follow up for thrombocytopenia.  Has not seen Rheumatology in  [place] or [place] since last visit.  States that FSBG's have been good and has experienced some lows when forgets to eat.  New facial rash she attributes to gluten allergy.  Patient concerned that she may have experienced an infusion Rxn to Orencia in the form of right sided HA and facial pain.  No change in UC symptoms.  Taking oral iron bid.  No bleeding issues except for mild epistaxis  Hgb 13.0  Review of Systems  Constitutional:  Positive for fatigue. Negative for appetite change, chills and fever.       Has gingival bleeding. Notes dried blood in AM when wakes up.  Bleeds when brushes teeth.  Last dental visit was "awhile"  Has dry mouth due to Sjogrens syndrome Weight fluctuates  HENT:   Positive for mouth sores and nosebleeds. Negative for hearing loss, lump/mass, sore throat, trouble swallowing and voice change.        Epistaxis not significant enough to cause her to go to ED  Eyes:  Negative for eye problems and icterus.       Vision changes:  None  Respiratory:  Negative for chest tightness, cough, hemoptysis and wheezing.        DOE:  Walking in a hurry  Cardiovascular:  Positive for leg swelling. Negative for palpitations.       Chest discomfort if cleaning the house  Gastrointestinal:  Positive for diarrhea and nausea. Negative for blood in stool, constipation and vomiting.       Periodic abdominal pain when has ulcerative colitis flare  Endocrine: Negative for hot flashes.       Cold intolerance:  none Heat intolerance:  none  Genitourinary:  Negative for bladder incontinence, difficulty urinating, dysuria, frequency, hematuria and nocturia.   Musculoskeletal:  Positive for arthralgias, back pain and myalgias.  Skin:  Positive for itching and rash. Negative for wound.       Rashes owing to psoriasis and lupus  Neurological:  Positive for dizziness, headaches and numbness. Negative for extremity weakness, light-headedness and speech difficulty.  Hematological:  Positive  for adenopathy. Bruises/bleeds easily.  Psychiatric/Behavioral:  Negative for suicidal ideas. The patient is not nervous/anxious.        Does not sleep well at night    MEDICAL HISTORY: Past Medical History:  Diagnosis Date   Anemia    Anxiety    Arthritis    Back pain    Bilateral swelling of feet    Cataract    not a surgical candidate at this time (08/22/2022)   Depression    Endometriosis    Fatty liver    Fibromyalgia    GERD (gastroesophageal reflux disease)    Hiatal hernia    Hyperlipemia    Hypertension    IBS (irritable bowel syndrome)    Joint pain    Lupus    Macular degeneration of both eyes    PONV (postoperative nausea and vomiting)    Psoriatic arthritis (HCC)    PTSD (post-traumatic stress disorder)    Seasonal allergies    Sjogren's  disease (HCC)    Sleep-disordered breathing 11/12/2021   UC (ulcerative colitis) (HCC)    Vitamin D deficiency     SURGICAL HISTORY: Past Surgical History:  Procedure Laterality Date   CESAREAN SECTION  1997   COLONOSCOPY  2021   JP-MAC-suprep(exc)-patchy erythema otherwise normal - recall 5 years   FLEXIBLE SIGMOIDOSCOPY N/A 01/06/2017   Procedure: FLEXIBLE SIGMOIDOSCOPY;  Surgeon: Benancio Deeds, MD;  Location: [facility] ENDOSCOPY;  Service: Gastroenterology;  Laterality: N/A;   MOLE REMOVAL     Right forearm   OOPHORECTOMY Bilateral    PARTIAL HYSTERECTOMY  07/2009   TEMPOROMANDIBULAR JOINT SURGERY Right 2009   TONSILLECTOMY  2009    SOCIAL HISTORY: Social History   Socioeconomic History   Marital status: Widowed    Spouse name: Not on file   Number of children: 1   Years of education: Not on file   Highest education level: 10th grade  Occupational History   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   Social Drivers of Health   Financial Resource Strain: Medium Risk (02/20/2023)   Overall Financial Resource Strain (CARDIA)    Difficulty of Paying Living Expenses: Somewhat hard  Food Insecurity: Food Insecurity Present (02/20/2023)   Hunger Vital Sign    Worried About Running Out of Food in the Last Year: Sometimes true    Ran Out of Food in the Last Year: Never true  Transportation Needs: No Transportation Needs (02/20/2023)   PRAPARE - Administrator, Civil Service (Medical): No    Lack of Transportation (Non-Medical): No  Physical Activity: Unknown (02/20/2023)   Exercise Vital Sign    Days of Exercise per Week: 0 days    Minutes of Exercise per Session: Not on file  Stress: Stress Concern Present (02/20/2023)   [employer] of Occupational Health - Occupational Stress Questionnaire    Feeling of Stress : Rather much  Social Connections: Moderately Isolated (02/20/2023)   Social Connection and Isolation Panel [NHANES]    Frequency of Communication with Friends and Family: More than three times a week    Frequency of Social Gatherings with Friends and Family: Once a week    Attends Religious Services: 1 to 4 times per year    Active Member of [employer] or Organizations: No    Attends Banker Meetings: Not on file    Marital Status: Widowed  Catering manager Violence: Not on file    FAMILY HISTORY Family History  Problem Relation Age of Onset   Colon polyps Maternal Grandmother    Lung cancer Maternal Grandmother    Liver disease Father        agent orange   Alcoholism Father    Thyroid disease Mother    Diabetes Maternal Grandfather    Heart disease Maternal Grandfather     Hypertension Maternal Grandfather    Colon cancer Maternal Uncle  great uncle   Breast cancer Neg Hx    Esophageal cancer Neg Hx    Stomach cancer Neg Hx    Rectal cancer Neg Hx     ALLERGIES:  is allergic to lyrica [pregabalin], sulfa antibiotics, ciprofloxacin, gabapentin, morphine sulfate, nsaids, prednisone, remicade [infliximab], tolmetin, codeine, humira [adalimumab], morphine and codeine, and penicillins.  MEDICATIONS:  Current Outpatient Medications  Medication Sig Dispense Refill   Apremilast 30 MG TABS Take 1 tablet by mouth in the morning and at bedtime.     Ascorbic Acid (VITA-C PO) Take 3 tablets by mouth daily at 6 (six) AM. 3 gummies a day     celecoxib (CELEBREX) 200 MG capsule Take 1 capsule (200 mg total) by mouth 2 (two) times daily. 60 capsule 3   clonazePAM (KLONOPIN) 1 MG tablet TAKE 1 TABLET(1 MG) BY MOUTH THREE TIMES DAILY AS NEEDED FOR ANXIETY 270 tablet 1   cromolyn (OPTICROM) 4 % ophthalmic solution Place 1 drop into both eyes 4 (four) times daily. 10 mL 1   cyclobenzaprine (FLEXERIL) 10 MG tablet Take 1-2 tablets (10-20 mg total) by mouth 3 (three) times daily as needed for muscle spasms. 60 tablet 0   diphenoxylate-atropine (LOMOTIL) 2.5-0.025 MG tablet Take 1 tablet by mouth 2 (two) times daily as needed for diarrhea or loose stools. 60 tablet 2   doxycycline (VIBRA-TABS) 100 MG tablet Take 1 tablet (100 mg total) by mouth 2 (two) times daily. 20 tablet 0   ferrous sulfate 324 (65 Fe) MG TBEC TAKE 1 TABLET(324 MG) BY MOUTH IN THE MORNING AND AT BEDTIME 180 tablet 1   folic acid (FOLVITE) 1 MG tablet Take 1 tablet (1 mg total) by mouth daily.     glucose 4 GM chewable tablet Chew 1 tablet (4 g total) by mouth as needed for low blood sugar. 50 tablet 1   halobetasol (ULTRAVATE) 0.05 % cream Apply 1 Application topically daily as needed.     losartan (COZAAR) 100 MG tablet TAKE 1 TABLET(100 MG) BY MOUTH DAILY 90 tablet 1   meclizine (ANTIVERT) 25 MG  tablet Take 1 tablet (25 mg total) by mouth 3 (three) times daily as needed for dizziness. 30 tablet 0   mesalamine (LIALDA) 1.2 g EC tablet TAKE 4 TABLETS(4.8 GRAMS) BY MOUTH DAILY 360 tablet 3   mupirocin ointment (BACTROBAN) 2 % Apply 1 Application topically 2 (two) times daily. 22 g 0   ondansetron (ZOFRAN) 8 MG tablet Take 1 tablet (8 mg total) by mouth every 8 (eight) hours as needed for nausea or vomiting. 20 tablet 0   pantoprazole (PROTONIX) 40 MG tablet Take 1 tablet (40 mg total) by mouth daily. 90 tablet 3   Potassium 95 MG TABS Take 1 tablet by mouth daily at 6 (six) AM.     promethazine (PHENERGAN) 25 MG tablet      sertraline (ZOLOFT) 100 MG tablet Take 1.5 tablets (150 mg total) by mouth daily. 135 tablet 3   Sodium Sulfate-Mag Sulfate-KCl (SUTAB) [phone] MG TABS Take 1 tablet by mouth as directed. BIN: [id number]  PCN: [id number]  GROUP: [id number]  MEMBER ID: [id number] 24 tablet 0   topiramate (TOPAMAX) 100 MG tablet Take 1 tablet (100 mg total) by mouth 2 (two) times daily. TAKE 1 TABLET(100 MG) BY MOUTH TWICE DAILY. 180 tablet 3   triamcinolone (NASACORT) 55 MCG/ACT AERO nasal inhaler Place 1 spray into the nose daily. Start with 1 spray each side twice a day for 3 days, then reduce  to daily. 1 each 2   valACYclovir (VALTREX) 1000 MG tablet Take 2 tablets (2,000 mg total) by mouth as needed. For two doses for cold sores. 20 tablet 5   Vitamin D, Ergocalciferol, (DRISDOL) 1.25 MG (50000 UNIT) CAPS capsule Take 1 capsule (50,000 Units total) by mouth every 7 (seven) days. 12 capsule 3   zinc gluconate 50 MG tablet Take 50 mg by mouth daily.     ORENCIA CLICKJECT 125 MG/ML SOAJ Inject 1 Syringe into the skin once a week.     No current facility-administered medications for this visit.    PHYSICAL EXAMINATION:  ECOG PERFORMANCE STATUS: 1 - Symptomatic but completely ambulatory   Vitals:   04/11/23 0902  BP: (!) 154/102  Pulse: 76  Resp: 18  Temp: 98.1 F (36.7 C)  SpO2: 99%     Filed Weights   04/11/23 0902  Weight: 251 lb 4.8 oz (114 kg)     Physical Exam Vitals and nursing note reviewed.  Constitutional:      General: She is not in acute distress.    Appearance: Normal appearance. She is obese. She is not ill-appearing, toxic-appearing or diaphoretic.     Comments: Here alone  HENT:     Head: Normocephalic and atraumatic.     Right Ear: External ear normal.     Left Ear: External ear normal.     Nose: Nose normal. No congestion or rhinorrhea.  Eyes:     General: No scleral icterus.    Extraocular Movements: Extraocular movements intact.     Conjunctiva/sclera: Conjunctivae normal.     Pupils: Pupils are equal, round, and reactive to light.  Cardiovascular:     Rate and Rhythm: Normal rate and regular rhythm.     Heart sounds: Normal heart sounds. No murmur heard.    No friction rub. No gallop.  Pulmonary:     Effort: Pulmonary effort is normal. No respiratory distress.     Breath sounds: Normal breath sounds. No stridor. No wheezing or rhonchi.  Abdominal:     General: Bowel sounds are normal.     Palpations: Abdomen is soft.     Tenderness: There is no abdominal tenderness. There is no guarding or rebound.  Musculoskeletal:        General: No swelling, tenderness or deformity.     Cervical back: Normal range of motion and neck supple. No rigidity or tenderness.  Lymphadenopathy:     Head:     Right side of head: No submental, submandibular, tonsillar, preauricular, posterior auricular or occipital adenopathy.     Left side of head: No submental, submandibular, tonsillar, preauricular, posterior auricular or occipital adenopathy.     Cervical: No cervical adenopathy.     Right cervical: No superficial, deep or posterior cervical adenopathy.    Left cervical: No superficial, deep or posterior cervical adenopathy.     Upper Body:     Right upper body: No supraclavicular, axillary, pectoral or epitrochlear adenopathy.     Left upper body:  No supraclavicular, axillary, pectoral or epitrochlear adenopathy.  Skin:    General: Skin is warm.     Coloration: Skin is not jaundiced.     Comments: Follicular rash on extensor surfaces of arms. Firm subcutaneous, mobile nodules on dorsum of forearms   Neurological:     General: No focal deficit present.     Mental Status: She is alert and oriented to person, place, and time.     Cranial Nerves: No cranial nerve  deficit.  Psychiatric:        Mood and Affect: Mood normal.        Behavior: Behavior normal.        Thought Content: Thought content normal.        Judgment: Judgment normal.     LABORATORY DATA: I have personally reviewed the data as listed:  No visits with results within 1 Month(s) from this visit.  Latest known visit with results is:  Office Visit on 01/24/2023  Component Date Value Ref Range Status   Vitamin B-12 01/24/2023 650  180 - 914 pg/mL Final   Comment: (NOTE) This assay is not validated for testing neonatal or myeloproliferative syndrome specimens for Vitamin B12 levels. Performed at [facility], [address], [place], [place] [address]    Folate 01/24/2023 5.5 (L)  >5.9 ng/mL Final   Performed at [facility], [address] [place], [place] [address]   Ferritin 01/24/2023 136  11 - 307 ng/mL Final   Performed at [facility], [address], [place], [place] [address]   Sodium 01/24/2023 137  135 - 145 mmol/L Final   Potassium 01/24/2023 3.6  3.5 - 5.1 mmol/L Final   Chloride 01/24/2023 107  98 - 111 mmol/L Final   CO2 01/24/2023 20 (L)  22 - 32 mmol/L Final   Glucose, Bld 01/24/2023 158 (H)  70 - 99 mg/dL Final   Glucose reference range applies only to samples taken after fasting for at least 8 hours.   BUN 01/24/2023 17  6 - 20 mg/dL Final   Creatinine, Ser 01/24/2023 0.65  0.44 - 1.00 mg/dL Final   Calcium 91/47/8295 8.4 (L)  8.9 - 10.3 mg/dL Final   Total Protein 62/13/0865 7.0   6.5 - 8.1 g/dL Final   Albumin 78/46/9629 4.0  3.5 - 5.0 g/dL Final   AST 52/84/1324 15  15 - 41 U/L Final   ALT 01/24/2023 21  0 - 44 U/L Final   Alkaline Phosphatase 01/24/2023 86  38 - 126 U/L Final   Total Bilirubin 01/24/2023 0.4  0.3 - 1.2 mg/dL Final   GFR, Estimated 01/24/2023 >60  >60 mL/min Final   Comment: (NOTE) Calculated using the CKD-EPI Creatinine Equation (2021)    Anion gap 01/24/2023 10  5 - 15 Final   Performed at [facility], [address], [place], [place] [address]   WBC 01/24/2023 9.4  4.0 - 10.5 K/uL Final   RBC 01/24/2023 5.12 (H)  3.87 - 5.11 MIL/uL Final   Hemoglobin 01/24/2023 13.1  12.0 - 15.0 g/dL Final   HCT 72/53/6644 42.7  36.0 - 46.0 % Final   MCV 01/24/2023 83.4  80.0 - 100.0 fL Final   MCH 01/24/2023 25.6 (L)  26.0 - 34.0 pg Final   MCHC 01/24/2023 30.7  30.0 - 36.0 g/dL Final   RDW 03/47/4259 16.9 (H)  11.5 - 15.5 % Final   Platelets 01/24/2023 151  150 - 400 K/uL Final   nRBC 01/24/2023 0.0  0.0 - 0.2 % Final   Neutrophils Relative % 01/24/2023 70  % Final   Neutro Abs 01/24/2023 6.6  1.7 - 7.7 K/uL Final   Lymphocytes Relative 01/24/2023 20  % Final   Lymphs Abs 01/24/2023 1.9  0.7 - 4.0 K/uL Final   Monocytes Relative 01/24/2023 5  % Final   Monocytes Absolute 01/24/2023 0.4  0.1 - 1.0 K/uL Final   Eosinophils Relative 01/24/2023 3  %  Final   Eosinophils Absolute 01/24/2023 0.3  0.0 - 0.5 K/uL Final   Basophils Relative 01/24/2023 1  % Final   Basophils Absolute 01/24/2023 0.1  0.0 - 0.1 K/uL Final   Immature Granulocytes 01/24/2023 1  % Final   Abs Immature Granulocytes 01/24/2023 0.07  0.00 - 0.07 K/uL Final   Performed at [facility], [address], [place], [place] [address]    RADIOGRAPHIC STUDIES: I have personally reviewed the radiological images as listed and agree with the findings in the report  No results found.  ASSESSMENT/PLAN  52 y.o. female with thrombocytopenia Medical  history is notable for inflammatory arthritis (lupus, psoriatic arthritis),  back pain, endometriosis, fatty liver, fibromyalgia,  sessile serrated colon polyps  Thrombocytopenia:  Mild and without bleeding complications.  Most likely causes in this patient are autoimmune syndromes, inflammation, splenomegaly, hepatic steatosis.   Sep 06 2022- Has mild gingival bleeding more likely related to xerostomia from Sjogren's than thrombocytopenia.  Can therefore hold on tranexamic acid or amicar mouthwash January 24 2023- PLT 151 April 11 2023- PLT 141  Iron deficiency:  Likely secondary to chronic GI blood loss from ulcerative colitis January 16, 2022: EGD  The esophagus was normal.  A few small inflammatory nodules in the antrum. The stomach was otherwise  normal.  Duodenum was normal. Colonoscopy  The terminal ileum appeared normal. Colon with patchy erythema throughout. Scarring in the region of the hepatic flexure,  again noted. Colon otherwise normal  Sep 06 2022- Tolerating oral iron.  Ferritin adequate.   January 24 2023- Ferritin 136. Folate 5.5 B12 650 hemoglobin 13.1.  No need for IV iron.  Tolerating oral iron bid.  To begin folic acid April 11 2023- Hgb 13.0 (stable) ferritin, B12, folate pending  Skin nodules:  April 05 2022:  U/S of skin nodules Complex cystic appearing lesions in the subcutaneous fat potentially some type of complex ganglion cyst. MRI without and with contrast suggested for further evaluation.   Sep 06 2022- Since otherwise asymptomatic recommended against further evaluation at this time January 24 2023- Improved since last visit  Sjogren syndrome  January 24 2023- Has upcoming visit at [facility].  Hypoglycemia has been reported with Sjogren's   April 11 2023- Encouraged patient to report side effects from Orencia to Rheuatology   Cancer Staging  No matching staging information was found for the patient.    No problem-specific Assessment & Plan  notes found for this encounter.   No orders of the defined types were placed in this encounter.   20  minutes was spent in patient care.  This included time spent preparing to see the patient (e.g., review of tests), obtaining and/or reviewing separately obtained history, counseling and educating the patient, ordering medications, tests/ procedures; documenting clinical information in the electronic or other health record, independently interpreting results and communicating results to the patient as well as coordination of care.        All questions were answered. The patient knows to call the clinic with any problems, questions or concerns.  This note was electronically signed.    Loni Muse, MD  04/11/2023 9:20 AM

## 2023-04-14 ENCOUNTER — Telehealth: Payer: Self-pay | Admitting: Family Medicine

## 2023-04-15 NOTE — Telephone Encounter (Signed)
Spoke with patient, no form received Advise to call [facility] to resend forms to [place] again Patient verbalized understanding

## 2023-04-15 NOTE — Telephone Encounter (Signed)
 Copied from CRM [id number]. Topic: General - Other >> Apr 15, 2023  1:45 PM Elizebeth Brooking wrote: Reason for CRM: Patient called in returning missed called from nurse, Requesting a callback

## 2023-04-15 NOTE — Telephone Encounter (Signed)
 Left message to return call to our office at their convenience.

## 2023-04-25 ENCOUNTER — Telehealth: Payer: Self-pay | Admitting: Family Medicine

## 2023-04-25 NOTE — Telephone Encounter (Unsigned)
 Copied from CRM [id number]. Topic: Referral - Status >> Apr 25, 2023  9:51 AM Pinkey ORN wrote: Reason for CRM: Patient called to follow up on paperwork that needs to be sent to [facility] >> Apr 25, 2023  9:53 AM Pinkey ORN wrote: Patient states it is URGENT that they receive that paperwork, she's unable to get an appointment without it.

## 2023-04-28 ENCOUNTER — Other Ambulatory Visit: Payer: Self-pay | Admitting: Internal Medicine

## 2023-05-06 ENCOUNTER — Telehealth: Payer: Self-pay | Admitting: Family Medicine

## 2023-05-06 NOTE — Telephone Encounter (Signed)
Copied from CRM [id number]. Topic: Referral - Status >> May 06, 2023  3:39 PM Almira Coaster wrote: Reason for CRM: Patient is calling to follow up on a referral that [facility] is stating has not been received. She would like for [place] to call them directly to see what's the issue because we tell her we sent it and they tell her they haven't received anything. She is frustrated. Their phone number is [phone].

## 2023-05-12 NOTE — Telephone Encounter (Signed)
Send information to [facility] referral Coordinator

## 2023-05-14 ENCOUNTER — Other Ambulatory Visit: Payer: Self-pay

## 2023-05-14 ENCOUNTER — Other Ambulatory Visit: Payer: Self-pay | Admitting: Internal Medicine

## 2023-05-14 MED ORDER — MESALAMINE 1.2 G PO TBEC: 4.8000 g | DELAYED_RELEASE_TABLET | Freq: Every day | ORAL | Status: DC

## 2023-06-03 ENCOUNTER — Telehealth: Payer: Self-pay | Admitting: Internal Medicine

## 2023-06-03 NOTE — Telephone Encounter (Signed)
Patient called and stated that she lives in [place] and if it snows she will not be able to come to do her procedure. Patient rescheduled for April the 7 th. Please advise.

## 2023-06-03 NOTE — Telephone Encounter (Signed)
Change noted.

## 2023-06-06 ENCOUNTER — Encounter: Payer: Commercial Managed Care - PPO | Admitting: Internal Medicine

## 2023-06-18 ENCOUNTER — Other Ambulatory Visit: Payer: Self-pay | Admitting: Family Medicine

## 2023-06-18 DIAGNOSIS — Z0000 Encounter for general adult medical examination without abnormal findings: Secondary | ICD-10-CM

## 2023-07-01 ENCOUNTER — Telehealth: Payer: Self-pay | Admitting: Family Medicine

## 2023-07-01 NOTE — Telephone Encounter (Signed)
 Referral form printed and placed to be reviewed in PCP office

## 2023-07-01 NOTE — Telephone Encounter (Unsigned)
 Copied from CRM [id number]. Topic: Referral - Question >> Jul 01, 2023 10:02 AM Desma Mcgregor wrote: Reason for CRM: [facility] called in regarding the referral that as started back on 02/21/23. Sheryle Hail stated that they still have yet to receive the 2 page referral form that is required to get the patient scheduled for an appointment. They have requested this 4 times, last sent 05/01/23. The forms that they are receiving from the office are not the correct forms. Their fax# is [phone]. She did advise that it would be easier to get the form from the website: medicine.http://gray.org/ > Divisions > Rheumatology and Immunology > Scroll down to Patient Care and go to Refer a Patient > Download the [facility] Patient Referral Form This is needed as soon as possible as the patient has been waiting a long while for this to be resolved.

## 2023-07-01 NOTE — Telephone Encounter (Signed)
 Spoke with [facility] referral coordinator, will check on this and let us know what it need to be done

## 2023-07-03 ENCOUNTER — Ambulatory Visit (AMBULATORY_SURGERY_CENTER): Payer: Commercial Managed Care - HMO

## 2023-07-03 VITALS — Ht 64.0 in | Wt 245.0 lb

## 2023-07-03 DIAGNOSIS — K51919 Ulcerative colitis, unspecified with unspecified complications: Secondary | ICD-10-CM

## 2023-07-03 DIAGNOSIS — Z860100 Personal history of colon polyps, unspecified: Secondary | ICD-10-CM

## 2023-07-03 NOTE — Progress Notes (Signed)
 No egg or soy allergy known to patient  No issues known to pt with past sedation with any surgeries or procedures Patient denies ever being told they had issues or difficulty with intubation  No FH of Malignant Hyperthermia Pt is not on diet pills Pt is not on home 02  Pt is not on blood thinners  Pt denies issues with chronic constipation  No A fib or A flutter Have any cardiac testing pending--no Ambulates independently

## 2023-07-10 ENCOUNTER — Other Ambulatory Visit: Payer: Self-pay

## 2023-07-10 DIAGNOSIS — D696 Thrombocytopenia, unspecified: Secondary | ICD-10-CM

## 2023-07-11 ENCOUNTER — Ambulatory Visit: Payer: Commercial Managed Care - PPO | Admitting: Oncology

## 2023-07-11 ENCOUNTER — Other Ambulatory Visit: Payer: Self-pay | Admitting: Oncology

## 2023-07-11 ENCOUNTER — Telehealth: Payer: Self-pay | Admitting: Oncology

## 2023-07-11 ENCOUNTER — Inpatient Hospital Stay: Payer: Self-pay | Attending: Oncology

## 2023-07-11 ENCOUNTER — Other Ambulatory Visit: Payer: Commercial Managed Care - PPO

## 2023-07-11 ENCOUNTER — Inpatient Hospital Stay: Payer: Self-pay | Admitting: Oncology

## 2023-07-11 VITALS — BP 147/99 | HR 84 | Temp 98.7°F | Resp 16 | Ht 64.0 in | Wt 249.9 lb

## 2023-07-11 DIAGNOSIS — M3500 Sicca syndrome, unspecified: Secondary | ICD-10-CM | POA: Insufficient documentation

## 2023-07-11 DIAGNOSIS — K760 Fatty (change of) liver, not elsewhere classified: Secondary | ICD-10-CM | POA: Diagnosis not present

## 2023-07-11 DIAGNOSIS — R161 Splenomegaly, not elsewhere classified: Secondary | ICD-10-CM | POA: Diagnosis not present

## 2023-07-11 DIAGNOSIS — D696 Thrombocytopenia, unspecified: Secondary | ICD-10-CM | POA: Insufficient documentation

## 2023-07-11 DIAGNOSIS — L4050 Arthropathic psoriasis, unspecified: Secondary | ICD-10-CM | POA: Insufficient documentation

## 2023-07-11 DIAGNOSIS — Z79899 Other long term (current) drug therapy: Secondary | ICD-10-CM | POA: Diagnosis not present

## 2023-07-11 LAB — CBC WITH DIFFERENTIAL (CANCER CENTER ONLY)
Abs Immature Granulocytes: 0.04 10*3/uL (ref 0.00–0.07)
Basophils Absolute: 0.2 10*3/uL — ABNORMAL HIGH (ref 0.0–0.1)
Basophils Relative: 2 %
Eosinophils Absolute: 0.5 10*3/uL (ref 0.0–0.5)
Eosinophils Relative: 5 %
HCT: 42.7 % (ref 36.0–46.0)
Hemoglobin: 13.6 g/dL (ref 12.0–15.0)
Immature Granulocytes: 0 %
Lymphocytes Relative: 24 %
Lymphs Abs: 2.6 10*3/uL (ref 0.7–4.0)
MCH: 25.6 pg — ABNORMAL LOW (ref 26.0–34.0)
MCHC: 31.9 g/dL (ref 30.0–36.0)
MCV: 80.3 fL (ref 80.0–100.0)
Monocytes Absolute: 0.6 10*3/uL (ref 0.1–1.0)
Monocytes Relative: 5 %
Neutro Abs: 7 10*3/uL (ref 1.7–7.7)
Neutrophils Relative %: 64 %
Platelet Count: 154 10*3/uL (ref 150–400)
RBC: 5.32 MIL/uL — ABNORMAL HIGH (ref 3.87–5.11)
RDW: 17.7 % — ABNORMAL HIGH (ref 11.5–15.5)
WBC Count: 10.9 10*3/uL — ABNORMAL HIGH (ref 4.0–10.5)
nRBC: 0 % (ref 0.0–0.2)
nRBC: 0 /100{WBCs}

## 2023-07-11 NOTE — Progress Notes (Signed)
 [facility]  [address] [place],  [place]  [address] [phone]  Clinic Day:  07/13/2023  Referring physician: Ardith Dark, MD   HISTORY OF PRESENT ILLNESS:  The patient is a 53 y.o. female with mild thrombocytopenia.  He comes in today for routine follow-up.  Of note, she carries multiple rheumatologic diagnoses, including lupus, sjogren's syndrome and psoriatic arthritis.  She also has fatty liver disease and secondary splenomegaly.  Since her last visit, the patient has been doing fairly well.  As it pertains to her thrombocytopenia, she denies having any subcutaneous bleeding/bruising issues which have concerned her for her platelet count dropping precipitously.    PHYSICAL EXAM:  Blood pressure (!) 147/99, pulse 84, temperature 98.7 F (37.1 C), temperature source Oral, resp. rate 16, height 5\' 4"  (1.626 m), weight 249 lb 14.4 oz (113.4 kg), last menstrual period 06/23/2009, SpO2 96%. Wt Readings from Last 3 Encounters:  07/11/23 249 lb 14.4 oz (113.4 kg)  07/03/23 245 lb (111.1 kg)  04/11/23 251 lb 4.8 oz (114 kg)   Body mass index is 42.9 kg/m. Performance status (ECOG): 0 - Asymptomatic Physical Exam Constitutional:      Appearance: Normal appearance. She is not ill-appearing.  HENT:     Mouth/Throat:     Mouth: Mucous membranes are moist.     Pharynx: Oropharynx is clear. No oropharyngeal exudate or posterior oropharyngeal erythema.  Cardiovascular:     Rate and Rhythm: Normal rate and regular rhythm.     Heart sounds: No murmur heard.    No friction rub. No gallop.  Pulmonary:     Effort: Pulmonary effort is normal. No respiratory distress.     Breath sounds: Normal breath sounds. No wheezing, rhonchi or rales.  Abdominal:     General: Bowel sounds are normal. There is no distension.     Palpations: Abdomen is soft. There is no mass.     Tenderness: There is no abdominal tenderness.  Musculoskeletal:        General: No  swelling.     Right lower leg: No edema.     Left lower leg: No edema.  Lymphadenopathy:     Cervical: No cervical adenopathy.     Upper Body:     Right upper body: No supraclavicular or axillary adenopathy.     Left upper body: No supraclavicular or axillary adenopathy.     Lower Body: No right inguinal adenopathy. No left inguinal adenopathy.  Skin:    General: Skin is warm.     Coloration: Skin is not jaundiced.     Findings: No lesion or rash.  Neurological:     General: No focal deficit present.     Mental Status: She is alert and oriented to person, place, and time. Mental status is at baseline.  Psychiatric:        Mood and Affect: Mood normal.        Behavior: Behavior normal.        Thought Content: Thought content normal.    LABS:      Latest Ref Rng & Units 07/11/2023    1:54 PM 04/11/2023    9:48 AM 01/24/2023   11:46 AM  CBC  WBC 4.0 - 10.5 K/uL 10.9  9.4  9.4   Hemoglobin 12.0 - 15.0 g/dL 91.4  78.2  95.6   Hematocrit 36.0 - 46.0 % 42.7  41.3  42.7   Platelets 150 - 400 K/uL 154  141  151  Latest Ref Rng & Units 01/24/2023   11:46 AM 04/05/2022   12:28 PM 02/04/2022   10:21 AM  CMP  Glucose 70 - 99 mg/dL 478  96  295   BUN 6 - 20 mg/dL 17  21  17    Creatinine 0.44 - 1.00 mg/dL 6.21  3.08  6.57   Sodium 135 - 145 mmol/L 137  140  141   Potassium 3.5 - 5.1 mmol/L 3.6  3.7  3.7   Chloride 98 - 111 mmol/L 107  110  109   CO2 22 - 32 mmol/L 20  23  23    Calcium 8.9 - 10.3 mg/dL 8.4  9.5  9.1   Total Protein 6.5 - 8.1 g/dL 7.0  7.1  6.5   Total Bilirubin 0.3 - 1.2 mg/dL 0.4  0.4  0.3   Alkaline Phos 38 - 126 U/L 86  82  66   AST 15 - 41 U/L 15  14  17    ALT 0 - 44 U/L 21  15  18     ASSESSMENT & PLAN:   Assessment/Plan:  A 53 y.o. female with thrombocytopenia.  However, her labs today show a completely normal platelet count.  Over the past few visits, her platelets have not been particularly low.  I do believe her liver disease and secondary  splenomegaly are likely the major reasons behind her borderline low platelets.  However, at their current level, I do anticipate her to have any subcutaneous bleeding/bruising issues.  I do feel comfortable seeing her back in 1 month for repeat clinical assessment.  The patient understands all the plans discussed today and is in agreement with them.    Jelina Paulsen Kirby Funk, MD

## 2023-07-11 NOTE — Telephone Encounter (Signed)
 07/11/23 Spoke with patient and confirmed next appts.

## 2023-07-18 ENCOUNTER — Encounter: Payer: Self-pay | Admitting: Internal Medicine

## 2023-07-18 ENCOUNTER — Ambulatory Visit
Admission: RE | Admit: 2023-07-18 | Discharge: 2023-07-18 | Disposition: A | Discharge status: Home / Self Care | Payer: Commercial Managed Care - HMO | Source: Ambulatory Visit | Attending: Family Medicine | Admitting: Family Medicine

## 2023-07-18 DIAGNOSIS — Z0000 Encounter for general adult medical examination without abnormal findings: Secondary | ICD-10-CM

## 2023-07-21 ENCOUNTER — Ambulatory Visit: Payer: Commercial Managed Care - PPO | Admitting: Internal Medicine

## 2023-07-21 ENCOUNTER — Encounter: Payer: Self-pay | Admitting: Internal Medicine

## 2023-07-21 VITALS — BP 116/77 | HR 69 | Temp 98.0°F | Resp 12 | Ht 64.0 in | Wt 245.0 lb

## 2023-07-21 DIAGNOSIS — Z1211 Encounter for screening for malignant neoplasm of colon: Secondary | ICD-10-CM

## 2023-07-21 DIAGNOSIS — K529 Noninfective gastroenteritis and colitis, unspecified: Secondary | ICD-10-CM

## 2023-07-21 DIAGNOSIS — Z860100 Personal history of colon polyps, unspecified: Secondary | ICD-10-CM

## 2023-07-21 DIAGNOSIS — Z860101 Personal history of adenomatous and serrated colon polyps: Secondary | ICD-10-CM | POA: Diagnosis not present

## 2023-07-21 DIAGNOSIS — K51919 Ulcerative colitis, unspecified with unspecified complications: Secondary | ICD-10-CM

## 2023-07-21 MED ORDER — SODIUM CHLORIDE 0.9 % IV SOLN
500.0000 mL | Freq: Once | INTRAVENOUS | Status: DC
Start: 2023-07-21 — End: 2023-07-21

## 2023-07-21 NOTE — Progress Notes (Signed)
 Called to room to assist during endoscopic procedure.  Patient ID and intended procedure confirmed with present staff. Received instructions for my participation in the procedure from the performing physician.

## 2023-07-21 NOTE — Op Note (Signed)
 [facility] Patient Name: Melinda Porter Procedure Date: 07/21/2023 7:11 AM MRN: [id number] Endoscopist: Wilhemina Bonito. Marina Goodell , MD, [id number] Age: 53 Referring MD:  Date of Birth: 1970/10/14 Gender: Female Account #: [id number] Procedure:                Colonoscopy with biopsies Indications:              High risk colon cancer surveillance: Personal                            history of sessile serrated colon polyp (less than                            10 mm in size) with no dysplasia (2019); mild                            colitis on colonoscopy 2021. Has been on Lialda.                            Currently without lower GI complaints Medicines:                Monitored Anesthesia Care Procedure:                Pre-Anesthesia Assessment:                           - Prior to the procedure, a History and Physical                            was performed, and patient medications and                            allergies were reviewed. The patient's tolerance of                            previous anesthesia was also reviewed. The risks                            and benefits of the procedure and the sedation                            options and risks were discussed with the patient.                            All questions were answered, and informed consent                            was obtained. Prior Anticoagulants: The patient has                            taken no anticoagulant or antiplatelet agents. ASA                            Grade Assessment: II - A patient with mild systemic  disease. After reviewing the risks and benefits,                            the patient was deemed in satisfactory condition to                            undergo the procedure.                           After obtaining informed consent, the colonoscope                            was passed under direct vision. Throughout the                            procedure, the  patient's blood pressure, pulse, and                            oxygen saturations were monitored continuously. The                            Olympus Scope [id number] was introduced through the                            anus and advanced to the the cecum, identified by                            appendiceal orifice and ileocecal valve. The                            ileocecal valve, appendiceal orifice, and rectum                            were photographed. The quality of the bowel                            preparation was excellent. The colonoscopy was                            performed without difficulty. The patient tolerated                            the procedure well. The bowel preparation used was                            SUPREP via split dose instruction. Scope In: 8:18:14 AM Scope Out: 8:33:30 AM Scope Withdrawal Time: 0 hours 10 minutes 12 seconds  Total Procedure Duration: 0 hours 15 minutes 16 seconds  Findings:                 The entire examined colon appeared normal on direct                            and retroflexion views. Biopsies were taken  throughout the colon with a cold forceps for                            histology. Complications:            No immediate complications. Estimated blood loss:                            None. Estimated Blood Loss:     Estimated blood loss: none. Impression:               - The entire examined colon is normal on direct and                            retroflexion views. Recommendation:           - Repeat colonoscopy in 5 years for surveillance.                           - Patient has a contact number available for                            emergencies. The signs and symptoms of potential                            delayed complications were discussed with the                            patient. Return to normal activities tomorrow.                            Written discharge instructions were  provided to the                            patient.                           - Resume previous diet.                           - Continue present medications.                           - Await pathology results. Wilhemina Bonito. Marina Goodell, MD 07/21/2023 8:47:57 AM This report has been signed electronically.

## 2023-07-21 NOTE — Progress Notes (Signed)
 HISTORY OF PRESENT ILLNESS:  Melinda Porter is a 53 y.o. female with a history of sessile serrated polyp and mild colitis on Lialda who presents today for surveillance colonoscopy  REVIEW OF SYSTEMS:  All non-GI ROS negative except for  Past Medical History:  Diagnosis Date   Allergy    Anemia    Anxiety    Arthritis    Back pain    Bilateral swelling of feet    Cataract    not a surgical candidate at this time (08/22/2022)   Depression    Endometriosis    Fatty liver    Fibromyalgia    GERD (gastroesophageal reflux disease)    Hiatal hernia    Hyperlipemia    Hypertension    IBS (irritable bowel syndrome)    Joint pain    Lupus    Macular degeneration of both eyes    PONV (postoperative nausea and vomiting)    Psoriatic arthritis (HCC)    PTSD (post-traumatic stress disorder)    Seasonal allergies    Sjogren's disease (HCC)    Sleep-disordered breathing 11/12/2021   Stroke (HCC) 2016   UC (ulcerative colitis) (HCC)    Vitamin D deficiency     Past Surgical History:  Procedure Laterality Date   CESAREAN SECTION  1997   COLONOSCOPY  2021   JP-MAC-suprep(exc)-patchy erythema otherwise normal - recall 5 years   FLEXIBLE SIGMOIDOSCOPY N/A 01/06/2017   Procedure: FLEXIBLE SIGMOIDOSCOPY;  Surgeon: Benancio Deeds, MD;  Location: [facility];  Service: Gastroenterology;  Laterality: N/A;   MOLE REMOVAL     Right forearm   OOPHORECTOMY Bilateral    PARTIAL HYSTERECTOMY  07/2009   TEMPOROMANDIBULAR JOINT SURGERY Right 2009   TONSILLECTOMY  2009    Social History Melinda Porter  reports that she quit smoking about 24 years ago. Her smoking use included cigarettes. She has never used smokeless tobacco. She reports that she does not drink alcohol and does not use drugs.  family history includes Alcoholism in her father; Colon cancer in her maternal uncle; Colon polyps in her maternal grandmother; Diabetes in her maternal grandfather; Heart disease in her maternal  grandfather; Hypertension in her maternal grandfather; Liver disease in her father; Lung cancer in her maternal grandmother; Thyroid disease in her mother.  Allergies  Allergen Reactions   Gabapentin Other (See Comments)    Stroke like symptoms   Lyrica [Pregabalin] Shortness Of Breath   Prednisone Other (See Comments)    "I get deathly ill"   Remicade [Infliximab] Anaphylaxis   Ciprofloxacin Nausea Only   Morphine Sulfate Itching    Other reaction(s): Unknown   Nsaids Other (See Comments)    Liver problems- can't take tylenol, ibuprofen etc; MD instructed patient to take Aleve?; patient states she tries to take Cymbalta instead if she is "really hurting"   Sulfa Antibiotics Rash and Other (See Comments)   Tolmetin Other (See Comments)    Other reaction(s): Other (See Comments) Liver problems- can't take tylenol, ibuprofen etc.   Codeine Rash    Other reaction(s): Unknown   Humira [Adalimumab] Rash    Worsening of Lupus rash.   Morphine And Codeine Rash    "Wired up"   Penicillins Rash    Has patient had a PCN reaction causing immediate rash, facial/tongue/throat swelling, SOB or lightheadedness with hypotension: NO Has patient had a PCN reaction causing severe rash involving mucus membranes or skin necrosis: NO Has patient had a PCN reaction that required hospitalization NO Has  patient had a PCN reaction occurring within the last 10 years: NO If all of the above answers are "NO", then may proceed with Cephalosporin use.       PHYSICAL EXAMINATION: Vital signs: BP (!) 171/98   Pulse 73   Temp 98 F (36.7 C) (Temporal)   Resp 12   Ht 5\' 4"  (1.626 m)   Wt 245 lb (111.1 kg)   LMP 06/23/2009   SpO2 97%   BMI 42.05 kg/m  General: Well-developed, well-nourished, no acute distress HEENT: Sclerae are anicteric, conjunctiva pink. Oral mucosa intact Lungs: Clear Heart: Regular Abdomen: soft, nontender, nondistended, no obvious ascites, no peritoneal signs, normal bowel  sounds. No organomegaly. Extremities: No edema Psychiatric: alert and oriented x3. Cooperative     ASSESSMENT:  History of SSP and mild colitis   PLAN:   Surveillance colonoscopy

## 2023-07-21 NOTE — Patient Instructions (Signed)
    Await biopsy results   Continue previous diet & medications   YOU HAD AN ENDOSCOPIC PROCEDURE TODAY AT THE [facility]:   Refer to the procedure report that was given to you for any specific questions about what was found during the examination.  If the procedure report does not answer your questions, please call your gastroenterologist to clarify.  If you requested that your care partner not be given the details of your procedure findings, then the procedure report has been included in a sealed envelope for you to review at your convenience later.  YOU SHOULD EXPECT: Some feelings of bloating in the abdomen. Passage of more gas than usual.  Walking can help get rid of the air that was put into your GI tract during the procedure and reduce the bloating. If you had a lower endoscopy (such as a colonoscopy or flexible sigmoidoscopy) you may notice spotting of blood in your stool or on the toilet paper. If you underwent a bowel prep for your procedure, you may not have a normal bowel movement for a few days.  Please Note:  You might notice some irritation and congestion in your nose or some drainage.  This is from the oxygen used during your procedure.  There is no need for concern and it should clear up in a day or so.  SYMPTOMS TO REPORT IMMEDIATELY:  Following lower endoscopy (colonoscopy or flexible sigmoidoscopy):  Excessive amounts of blood in the stool  Significant tenderness or worsening of abdominal pains  Swelling of the abdomen that is new, acute  Fever of 100F or higher   For urgent or emergent issues, a gastroenterologist can be reached at any hour by calling [phone]. Do not use MyChart messaging for urgent concerns.    DIET:  We do recommend a small meal at first, but then you may proceed to your regular diet.  Drink plenty of fluids but you should avoid alcoholic beverages for 24 hours.  ACTIVITY:  You should plan to take it easy for the rest of today  and you should NOT DRIVE or use heavy machinery until tomorrow (because of the sedation medicines used during the test).    FOLLOW UP: Our staff will call the number listed on your records the next business day following your procedure.  We will call around 7:15- 8:00 am to check on you and address any questions or concerns that you may have regarding the information given to you following your procedure. If we do not reach you, we will leave a message.     If any biopsies were taken you will be contacted by phone or by letter within the next 1-3 weeks.  Please call us at [phone] if you have not heard about the biopsies in 3 weeks.    SIGNATURES/CONFIDENTIALITY: You and/or your care partner have signed paperwork which will be entered into your electronic medical record.  These signatures attest to the fact that that the information above on your After Visit Summary has been reviewed and is understood.  Full responsibility of the confidentiality of this discharge information lies with you and/or your care-partner.

## 2023-07-21 NOTE — Progress Notes (Signed)
 Pt's states no medical or surgical changes since previsit or office visit.

## 2023-07-21 NOTE — Progress Notes (Signed)
 Pt resting comfortably. VSS. Airway intact. SBAR complete to RN. All questions answered.

## 2023-07-22 ENCOUNTER — Telehealth: Payer: Self-pay

## 2023-07-22 NOTE — Telephone Encounter (Signed)
  Follow up Call-     07/21/2023    7:14 AM  Call back number  Post procedure Call Back phone  # [phone]  Permission to leave phone message Yes     Patient questions:  Do you have a fever, pain , or abdominal swelling? No. Pain Score  0 *  Have you tolerated food without any problems? Yes.    Have you been able to return to your normal activities? No.  Do you have any questions about your discharge instructions: Diet   No. Medications  No. Follow up visit  No.  Do you have questions or concerns about your Care? No.  Actions: * If pain score is 4 or above: No action needed, pain <4.

## 2023-07-28 ENCOUNTER — Encounter: Payer: Self-pay | Admitting: Internal Medicine

## 2023-07-28 LAB — SURGICAL PATHOLOGY

## 2023-08-07 ENCOUNTER — Telehealth: Payer: Self-pay | Admitting: Internal Medicine

## 2023-08-07 NOTE — Telephone Encounter (Signed)
 Inbound call from patient, states pharmacy is stating mesalamine  needs a prior authorization due to changing insurance at the beginning of the year

## 2023-08-08 ENCOUNTER — Other Ambulatory Visit (HOSPITAL_COMMUNITY): Payer: Self-pay

## 2023-08-08 ENCOUNTER — Telehealth: Payer: Self-pay

## 2023-08-08 NOTE — Telephone Encounter (Signed)
 Pharmacy Patient Advocate Encounter   Received notification from Pt Calls Messages that prior authorization for Mesalamine  1.2GM dr tablets is required/requested.   Insurance verification completed.   The patient is insured through [employer] .   Per test claim: Prior Authorization form/request asks a question that requires your assistance. Please see the question below and advise accordingly. The PA will not be submitted until the necessary information is received.

## 2023-08-11 NOTE — Telephone Encounter (Signed)
 Insurance is requiring that patient has tried mesalamine  800mg  (Asacol ) or Balsalazide before they will approve Lialda .  She has been on Lialda  since diagnosed in 2021 and I don't think she has ever taken anything else.  Please advise.

## 2023-08-12 NOTE — Telephone Encounter (Signed)
 Asacol  2.4 g daily (three 800 mg), would be acceptable alternative. Please make sure the patient is aware. Thanks, Dr. Elvin Hammer

## 2023-08-13 ENCOUNTER — Other Ambulatory Visit (HOSPITAL_COMMUNITY): Payer: Self-pay

## 2023-08-13 ENCOUNTER — Telehealth: Payer: Self-pay | Admitting: Family Medicine

## 2023-08-13 ENCOUNTER — Ambulatory Visit: Admitting: Family Medicine

## 2023-08-13 MED ORDER — MESALAMINE 800 MG PO TBEC: 3.0000 | DELAYED_RELEASE_TABLET | Freq: Every day | ORAL | 2 refills | Status: DC

## 2023-08-13 NOTE — Telephone Encounter (Signed)
 Patient dropped off document FMLA, to be filled out by provider. Patient requested to send it back via Fax within 5-days. Document is located in providers tray at front office.Please advise at Mobile [phone] (mobile)

## 2023-08-13 NOTE — Telephone Encounter (Signed)
 Sent in Asacol  - which is supposed to be one of the ones covered by [place].  Hopefully this works.

## 2023-08-13 NOTE — Telephone Encounter (Signed)
 Inbound call from patient requesting a call to discuss if prior authorization for Mesalamine  had been approved. Patient states she will be running out of medication and is wanting to know next steps. Please advise.

## 2023-08-13 NOTE — Addendum Note (Signed)
 Addended by: Philicia Heyne K on: 08/13/2023 02:42 PM   Modules accepted: Orders

## 2023-08-14 NOTE — Telephone Encounter (Signed)
 Patient need OV  Appt Schedule on 08/15/2023 with PCP

## 2023-08-15 ENCOUNTER — Ambulatory Visit (INDEPENDENT_AMBULATORY_CARE_PROVIDER_SITE_OTHER): Admitting: Family Medicine

## 2023-08-15 ENCOUNTER — Encounter: Payer: Self-pay | Admitting: Family Medicine

## 2023-08-15 VITALS — BP 118/76 | HR 82 | Temp 97.2°F | Ht 64.0 in | Wt 245.2 lb

## 2023-08-15 DIAGNOSIS — H353290 Exudative age-related macular degeneration, unspecified eye, stage unspecified: Secondary | ICD-10-CM

## 2023-08-15 DIAGNOSIS — F321 Major depressive disorder, single episode, moderate: Secondary | ICD-10-CM | POA: Diagnosis not present

## 2023-08-15 DIAGNOSIS — J302 Other seasonal allergic rhinitis: Secondary | ICD-10-CM

## 2023-08-15 DIAGNOSIS — F419 Anxiety disorder, unspecified: Secondary | ICD-10-CM | POA: Diagnosis not present

## 2023-08-15 DIAGNOSIS — M3219 Other organ or system involvement in systemic lupus erythematosus: Secondary | ICD-10-CM | POA: Diagnosis not present

## 2023-08-15 DIAGNOSIS — R600 Localized edema: Secondary | ICD-10-CM | POA: Diagnosis not present

## 2023-08-15 DIAGNOSIS — I10 Essential (primary) hypertension: Secondary | ICD-10-CM

## 2023-08-15 DIAGNOSIS — Z0279 Encounter for issue of other medical certificate: Secondary | ICD-10-CM

## 2023-08-15 LAB — COMPREHENSIVE METABOLIC PANEL WITH GFR
ALT: 23 U/L (ref 0–35)
AST: 20 U/L (ref 0–37)
Albumin: 4.4 g/dL (ref 3.5–5.2)
Alkaline Phosphatase: 65 U/L (ref 39–117)
BUN: 13 mg/dL (ref 6–23)
CO2: 23 meq/L (ref 19–32)
Calcium: 9 mg/dL (ref 8.4–10.5)
Chloride: 108 meq/L (ref 96–112)
Creatinine, Ser: 0.89 mg/dL (ref 0.40–1.20)
GFR: 75.24 mL/min (ref >60.00)
Glucose, Bld: 117 mg/dL — ABNORMAL HIGH (ref 70–99)
Potassium: 3.9 meq/L (ref 3.5–5.1)
Sodium: 140 meq/L (ref 135–145)
Total Bilirubin: 0.4 mg/dL (ref 0.2–1.2)
Total Protein: 6.9 g/dL (ref 6.0–8.3)

## 2023-08-15 LAB — URINALYSIS, ROUTINE W REFLEX MICROSCOPIC
Bilirubin Urine: NEGATIVE
Hgb urine dipstick: NEGATIVE
Ketones, ur: NEGATIVE
Nitrite: NEGATIVE
Specific Gravity, Urine: 1.025 (ref 1.000–1.030)
Total Protein, Urine: NEGATIVE
Urine Glucose: NEGATIVE
Urobilinogen, UA: 0.2 (ref 0.0–1.0)
pH: 6 (ref 5.0–8.0)

## 2023-08-15 LAB — TSH: TSH: 1.94 u[IU]/mL (ref 0.35–5.50)

## 2023-08-15 LAB — BRAIN NATRIURETIC PEPTIDE: Pro B Natriuretic peptide (BNP): 9 pg/mL (ref 0.0–100.0)

## 2023-08-15 NOTE — Assessment & Plan Note (Signed)
 She has been under more duress recently however symptoms are overall manageable.  She has elevated PHQ and GAD score today.  We did discuss changing medications or referral to see a therapist however she declined.  She will continue with Zoloft  150 mg daily.

## 2023-08-15 NOTE — Assessment & Plan Note (Signed)
 Symptoms are not rolled.  She has to avoid antihistamines due to her Sjogren's syndrome.  She is not interested in further treatment for this at this point though will let us  know if she needs referral to see an allergist.

## 2023-08-15 NOTE — Patient Instructions (Addendum)
 It was very nice to see you today!  We will check blood work today to look for causes of your swelling.   Please try to keep your legs elevated.   I will refer you to see the ophthalmologist and also rheumatologist.  Return if symptoms worsen or fail to improve.   Take care, Dr Daneil Dunker  PLEASE NOTE:  If you had any lab tests, please let us  know if you have not heard back within a few days. You may see your results on mychart before we have a chance to review them but we will give you a call once they are reviewed by us .   If we ordered any referrals today, please let us  know if you have not heard from their office within the next week.   If you had any urgent prescriptions sent in today, please check with the pharmacy within an hour of our visit to make sure the prescription was transmitted appropriately.   Please try these tips to maintain a healthy lifestyle:  Eat at least 3 REAL meals and 1-2 snacks per day.  Aim for no more than 5 hours between eating.  If you eat breakfast, please do so within one hour of getting up.   Each meal should contain half fruits/vegetables, one quarter protein, and one quarter carbs (no bigger than a computer mouse)  Cut down on sweet beverages. This includes juice, soda, and sweet tea.   Drink at least 1 glass of water with each meal and aim for at least 8 glasses per day  Exercise at least 150 minutes every week.

## 2023-08-15 NOTE — Assessment & Plan Note (Signed)
 She is in the process of changing her rheumatologist and is waiting to be seen at [facility] however they would not be able to see her for another 15 months.  She is not currently on any medications for her lupus.  Will check labs today as above.  I will place a referral for her to establish care locally before her appointment with [facility] next year.

## 2023-08-15 NOTE — Assessment & Plan Note (Signed)
 Will place referral for her to establish with a new ophthalmologist.

## 2023-08-15 NOTE — Assessment & Plan Note (Signed)
 Blood pressure at goal today on losartan  100 mg daily.  Check labs today.

## 2023-08-15 NOTE — Telephone Encounter (Signed)
 FMLA form faxed to [phone] Copy placed to be scan in patient chart  Mail to patient to address provider in chart

## 2023-08-15 NOTE — Assessment & Plan Note (Addendum)
 See anxiety A/P.  Not fully controlled though symptoms are manageable on Zoloft  150 mg daily and clonazepam  1 mg 3 times daily as needed.

## 2023-08-15 NOTE — Progress Notes (Signed)
 Melinda Porter is a 53 y.o. female who presents today for an office visit.  Assessment/Plan:  New/Acute Problems: Peripheral edema No red flags.  Overall reassuring exam.  Discussed with patient that this may be related to her underlying autoimmune condition including lupus however we will check labs today to further evaluate.  Overall low suspicion for DVT given her low Wells score.  Low suspicion for CHF as well.  Will check labs including CBC, c-Met, TSH, D-dimer, BNP, and urinalysis.  We did discuss conservative measures including salt avoidance and limb elevation.  In light of her complex medical history would consider echocardiogram if symptoms persist and above workup is negative.  We discussed reasons to return to care and seek emergent care.  Chronic Problems Addressed Today: Systemic lupus erythematosus (HCC) She is in the process of changing her rheumatologist and is waiting to be seen at [facility] rheumatology however they would not be able to see her for another 15 months.  She is not currently on any medications for her lupus.  Will check labs today as above.  I will place a referral for her to establish care locally before her appointment with [facility] next year.  Seasonal allergies Symptoms are not rolled.  She has to avoid antihistamines due to her Sjogren's syndrome.  She is not interested in further treatment for this at this point though will let us  know if she needs referral to see an allergist.  Depression, major, single episode, moderate (HCC) She has been under more duress recently however symptoms are overall manageable.  She has elevated PHQ and GAD score today.  We did discuss changing medications or referral to see a therapist however she declined.  She will continue with Zoloft  150 mg daily.  Anxiety See anxiety A/P.  Not fully controlled though symptoms are manageable on Zoloft  150 mg daily and clonazepam  1 mg 3 times daily as needed.  Hypertension Blood pressure at goal  today on losartan  100 mg daily.  Check labs today.  Macular degeneration, wet (HCC) Will place referral for her to establish with a new ophthalmologist.     Subjective:  HPI:  See Assessment / plan for status of chronic conditions.  Patient was last here about a year ago.  She has a few issues that she would like to discuss today.  Primary concern today is peripheral edema.  This started several days ago.  Predominately located in her right foot though she has also noticed more swelling in her right hand as well.  No obvious injuries or precipitating events.  No treatments tried.  She is felt a little "off" however no other recent illnesses.  No fevers or chills.  She has not had any nausea.  She has had occasional diarrhea.  No bleeding.  No change in medications.  No change in diet recently.  No shortness of breath.  No orthopnea.  No cough.  Since our last visit she has been following with hematology for thrombocytopenia.  At our last visit we referred her to see rheumatology at tertiary care center at [facility] however they would not be able to see her until next year.  She is not currently being treated for her lupus.  She has been a few different medications in the past with varying success but typically has to discontinue due to side effects.  She would like for us  to complete FMLA paperwork for her lupus today.       Objective:  Physical Exam: BP 118/76  Pulse 82   Temp (!) 97.2 F (36.2 C) (Temporal)   Ht 5\' 4"  (1.626 m)   Wt 245 lb 3.2 oz (111.2 kg)   LMP 06/23/2009   SpO2 97%   BMI 42.09 kg/m   Gen: No acute distress, resting comfortably CV: Regular rate and rhythm with no murmurs appreciated Pulm: Normal work of breathing, clear to auscultation bilaterally with no crackles, wheezes, or rhonchi MUSCULOSKELETAL: 1+ pitting edema to midshin bilaterally with right worse than left nonpitting edema noted in upper extremities right worse than left. Neuro: Grossly normal, moves all  extremities Psych: Normal affect and thought content  Time Spent: 45 minutes of total time was spent on the date of the encounter performing the following actions: chart review prior to seeing the patient, obtaining history, performing a medically necessary exam, completing FMLA paperwork, counseling on the treatment plan, placing orders, and documenting in our EHR.        Jinny Mounts. Daneil Dunker, MD 08/15/2023 11:04 AM

## 2023-08-16 LAB — CBC WITH DIFFERENTIAL/PLATELET
Basophils Absolute: 0.1 10*3/uL (ref 0.0–0.1)
Basophils Relative: 1.4 % (ref 0.0–3.0)
Eosinophils Absolute: 0.3 10*3/uL (ref 0.0–0.7)
Eosinophils Relative: 4.5 % (ref 0.0–5.0)
HCT: 41.4 % (ref 36.0–46.0)
Hemoglobin: 13.2 g/dL (ref 12.0–15.0)
Lymphocytes Relative: 24.2 % (ref 12.0–46.0)
Lymphs Abs: 1.8 10*3/uL (ref 0.7–4.0)
MCHC: 32 g/dL (ref 30.0–36.0)
MCV: 80.5 fl (ref 78.0–100.0)
Monocytes Absolute: 0.4 10*3/uL (ref 0.1–1.0)
Monocytes Relative: 5.5 % (ref 3.0–12.0)
Neutro Abs: 4.9 10*3/uL (ref 1.4–7.7)
Neutrophils Relative %: 64.4 % (ref 43.0–77.0)
Platelets: 127 10*3/uL — ABNORMAL LOW (ref 150.0–400.0)
RBC: 5.14 Mil/uL — ABNORMAL HIGH (ref 3.87–5.11)
RDW: 18.3 % — ABNORMAL HIGH (ref 11.5–15.5)
WBC: 7.6 10*3/uL (ref 4.0–10.5)

## 2023-08-16 LAB — D-DIMER, QUANTITATIVE: D-Dimer, Quant: 0.47 ug{FEU}/mL (ref <0.50)

## 2023-08-18 ENCOUNTER — Encounter: Payer: Self-pay | Admitting: Family Medicine

## 2023-08-18 ENCOUNTER — Ambulatory Visit: Payer: Self-pay

## 2023-08-18 NOTE — Progress Notes (Signed)
 No obvious reason for her leg swelling on her labs.  Her test for blood clot was negative.  Her heart failure test was also normal.  Her sugar is up a little bit but not into the diabetic range.  Her platelets are low but stable compared to her previous values.  Looks like she did have a little inflammation in her urine however no other abnormalities.  Recommend she come back for urine culture if she is having any frequency or dysuria.  Overall her labs are all reassuring.  She should let us  know if the leg swelling is not improving and we can discuss next steps in evaluation.

## 2023-08-18 NOTE — Telephone Encounter (Signed)
 Copied from CRM [id number]. Topic: Clinical - Lab/Test Results >> Aug 18, 2023  8:21 AM Rosaria Common wrote: Reason for CRM: Pt calling in regards to test results. Callback number is [phone]. Leave a VM if pt does not answer.  Called pt and explained at present time the provider has not entered any information into her chart for us  to provider/discuss with her regarding lab results.  Informed pt to call back later or staff will call as soon as the provider provides information to be relied to patient.

## 2023-08-21 NOTE — Telephone Encounter (Signed)
 See note

## 2023-08-27 ENCOUNTER — Other Ambulatory Visit: Payer: Self-pay | Admitting: *Deleted

## 2023-08-27 DIAGNOSIS — I10 Essential (primary) hypertension: Secondary | ICD-10-CM

## 2023-08-27 DIAGNOSIS — K219 Gastro-esophageal reflux disease without esophagitis: Secondary | ICD-10-CM

## 2023-08-27 MED ORDER — TOPIRAMATE 100 MG PO TABS: 100.0000 mg | ORAL_TABLET | Freq: Two times a day (BID) | ORAL | 3 refills | Status: DC

## 2023-08-27 MED ORDER — LOSARTAN POTASSIUM 100 MG PO TABS: ORAL_TABLET | ORAL | 1 refills | Status: DC

## 2023-08-27 MED ORDER — PANTOPRAZOLE SODIUM 40 MG PO TBEC: 40.0000 mg | DELAYED_RELEASE_TABLET | Freq: Every day | ORAL | 3 refills | Status: DC

## 2023-08-27 MED ORDER — SERTRALINE HCL 100 MG PO TABS: 150.0000 mg | ORAL_TABLET | Freq: Every day | ORAL | 3 refills | Status: DC

## 2023-08-28 ENCOUNTER — Telehealth: Payer: Self-pay | Admitting: *Deleted

## 2023-08-28 NOTE — Telephone Encounter (Unsigned)
 Copied from CRM [id number]. Topic: Referral - Status >> Aug 28, 2023 10:56 AM Howard Macho wrote: Reason for CRM: patient called stating the doctor put in two referrals and she has not heard anything back regarding them. Patient stated it was rheumatology with [facility] and a retina specialist  CB [phone]

## 2023-08-29 ENCOUNTER — Ambulatory Visit: Admitting: Family Medicine

## 2023-08-29 ENCOUNTER — Ambulatory Visit (INDEPENDENT_AMBULATORY_CARE_PROVIDER_SITE_OTHER): Admitting: Family Medicine

## 2023-08-29 VITALS — BP 121/84 | HR 80 | Temp 96.8°F | Ht 64.0 in | Wt 243.0 lb

## 2023-08-29 DIAGNOSIS — R600 Localized edema: Secondary | ICD-10-CM | POA: Diagnosis not present

## 2023-08-29 DIAGNOSIS — I10 Essential (primary) hypertension: Secondary | ICD-10-CM

## 2023-08-29 DIAGNOSIS — N390 Urinary tract infection, site not specified: Secondary | ICD-10-CM | POA: Diagnosis not present

## 2023-08-29 LAB — POCT URINALYSIS DIPSTICK
Bilirubin, UA: NEGATIVE
Blood, UA: NEGATIVE
Glucose, UA: NEGATIVE
Ketones, UA: NEGATIVE
Nitrite, UA: NEGATIVE
Protein, UA: NEGATIVE
Spec Grav, UA: 1.025 (ref 1.010–1.025)
Urobilinogen, UA: 0.2 U/dL
pH, UA: 5 (ref 5.0–8.0)

## 2023-08-29 MED ORDER — FUROSEMIDE 20 MG PO TABS: 20.0000 mg | ORAL_TABLET | Freq: Every day | ORAL | 3 refills | Status: DC | PRN

## 2023-08-29 MED ORDER — NITROFURANTOIN MONOHYD MACRO 100 MG PO CAPS
100.0000 mg | ORAL_CAPSULE | Freq: Two times a day (BID) | ORAL | 0 refills | Status: DC
Start: 2023-08-29 — End: 2023-09-01

## 2023-08-29 NOTE — Assessment & Plan Note (Signed)
 At goal today on losartan 100 mg daily.

## 2023-08-29 NOTE — Assessment & Plan Note (Signed)
 Likely secondary to venous insufficiency.  Labs a couple weeks ago were negative.  No orthopnea or other red flags concerning for volume overload.  In light of her complex medical history we will check an echocardiogram to rule out CHF though overall low suspicion for this as well.  Symptoms are still bothersome to her despite conservative measures.  Will start low-dose Lasix 20 mg daily as needed.  Instructed patient to take an extra fluid pill on the days that she takes Lasix.  She will follow-up with us  in 1 to 2 weeks.  If still does not have any improvement with this would consider referral to see vascular for further evaluation.  We discussed reasons to return to care.

## 2023-08-29 NOTE — Progress Notes (Signed)
   Melinda Porter is a 53 y.o. female who presents today for an office visit.  Assessment/Plan:  New/Acute Problems: Urinary frequency UA with positive leukocytes.  History consistent with previous UTIs.  Will start Macrobid  empirically while we await culture results.  No red flags or signs of systemic infection.  Encouraged hydration.  We discussed reasons to return to care.  Chronic Problems Addressed Today: Peripheral edema Likely secondary to venous insufficiency.  Labs a couple weeks ago were negative.  No orthopnea or other red flags concerning for volume overload.  In light of her complex medical history we will check an echocardiogram to rule out CHF though overall low suspicion for this as well.  Symptoms are still bothersome to her despite conservative measures.  Will start low-dose Lasix 20 mg daily as needed.  Instructed patient to take an extra fluid pill on the days that she takes Lasix.  She will follow-up with us  in 1 to 2 weeks.  If still does not have any improvement with this would consider referral to see vascular for further evaluation.  We discussed reasons to return to care.  Hypertension At goal today on losartan  100 mg daily.    Subjective:  HPI:  See A/P for status of chronic conditions.  Patient is here for follow-up.  I saw her 2 weeks ago.  We check labs at that time including CBC, c-Met, TSH, BNP, and D-dimer which were all negative.  She does have persistent leg edema since our last visit.  She has been trying to keep her leg elevated.  Has not made much success with this.  She is also watching sodium intake as well.  No shortness of breath.  No orthopnea.  No cough.  She is also concerned about UTI.  Symptoms started a few days ago with increased frequency and mild irritation with urination.  No dysuria.       Objective:  Physical Exam: BP 121/84   Pulse 80   Temp (!) 96.8 F (36 C) (Temporal)   Ht 5\' 4"  (1.626 m)   Wt 243 lb (110.2 kg)   LMP  06/23/2009   SpO2 96%   BMI 41.71 kg/m   Gen: No acute distress, resting comfortably CV: Regular rate and rhythm with no murmurs appreciated Pulm: Normal work of breathing, clear to auscultation bilaterally with no crackles, wheezes, or rhonchi MUSCULOSKELETAL: 1+ pitting edema to right leg.  A few scattered varicosities noted. Neuro: Grossly normal, moves all extremities Psych: Normal affect and thought content      Melinda Porter M. Daneil Dunker, MD 08/29/2023 1:25 PM

## 2023-08-29 NOTE — Telephone Encounter (Signed)
 Left message on personal voicemail that referral have been placed. The Rheumatology referral says pending review. If you do not hear from anyone by next Wednesday please give us  a call. Any questions call office.

## 2023-08-31 LAB — URINE CULTURE
MICRO NUMBER:: 16466064
SPECIMEN QUALITY:: ADEQUATE

## 2023-09-01 ENCOUNTER — Ambulatory Visit: Payer: Self-pay | Admitting: Family Medicine

## 2023-09-01 ENCOUNTER — Telehealth: Payer: Self-pay | Admitting: *Deleted

## 2023-09-01 ENCOUNTER — Other Ambulatory Visit: Payer: Self-pay | Admitting: *Deleted

## 2023-09-01 MED ORDER — FUROSEMIDE 20 MG PO TABS: 20.0000 mg | ORAL_TABLET | Freq: Every day | ORAL | 3 refills | Status: DC | PRN

## 2023-09-01 MED ORDER — NITROFURANTOIN MONOHYD MACRO 100 MG PO CAPS: 100.0000 mg | ORAL_CAPSULE | Freq: Two times a day (BID) | ORAL | 0 refills | Status: DC

## 2023-09-01 NOTE — Telephone Encounter (Signed)
 Ok with me. Please place any necessary orders.

## 2023-09-01 NOTE — Progress Notes (Signed)
 Her urine culture confirms UTI.  The antibiotic we have her on should treat this.  She should let us  know if her symptoms are not improving.

## 2023-09-01 NOTE — Telephone Encounter (Signed)
 Copied from CRM [id number]. Topic: Clinical - Prescription Issue >> Sep 01, 2023  9:42 AM Clydene Darner H wrote: Reason for CRM: Patient called stating that [facility] was unable to fill Lasix  and  Macrobid  medications, as a 90-day supply is required. Patient is [id number] [place], [place] - [address] [facility] [place] [address] Phone: [phone] Fax: [phone]    [facility]  [facility]

## 2023-09-02 ENCOUNTER — Other Ambulatory Visit: Payer: Self-pay | Admitting: *Deleted

## 2023-09-02 ENCOUNTER — Telehealth: Payer: Self-pay | Admitting: *Deleted

## 2023-09-02 ENCOUNTER — Encounter: Payer: Self-pay | Admitting: Family Medicine

## 2023-09-02 NOTE — Telephone Encounter (Signed)
 See note

## 2023-09-02 NOTE — Telephone Encounter (Signed)
 [facility] was [facility]

## 2023-09-02 NOTE — Telephone Encounter (Signed)
 Copied from CRM [id number]. Topic: Referral - Question >> Sep 02, 2023  8:39 AM Turkey A wrote: Reason for CRM: Patient would like to know what is the name of the Retina Specialist so she can make appointment -please contact   LVM with  CONTACT INFO for ophthalmology referral    [address], [address] [place], [place] [address]/ [phone] Horald Lyme

## 2023-09-03 ENCOUNTER — Telehealth: Payer: Self-pay | Admitting: Family Medicine

## 2023-09-03 NOTE — Telephone Encounter (Signed)
 Form re-faxed

## 2023-09-03 NOTE — Telephone Encounter (Signed)
 Melinda Porter

## 2023-09-03 NOTE — Telephone Encounter (Signed)
 Ok to addend her ADA form.

## 2023-09-03 NOTE — Telephone Encounter (Signed)
 Copied from CRM [id number]. Topic: General - Other >> Sep 03, 2023  2:46 PM Albertha Alosa wrote: Reason for CRM: [facility] called in regarding referral they are not bale to scheduled the patient, would need to send referral to another facility

## 2023-09-04 ENCOUNTER — Other Ambulatory Visit: Payer: Self-pay

## 2023-09-04 ENCOUNTER — Other Ambulatory Visit: Payer: Self-pay | Admitting: Family Medicine

## 2023-09-04 DIAGNOSIS — F419 Anxiety disorder, unspecified: Secondary | ICD-10-CM

## 2023-09-04 MED ORDER — CLONAZEPAM 1 MG PO TABS: 1.0000 mg | ORAL_TABLET | Freq: Three times a day (TID) | ORAL | 1 refills | Status: DC | PRN

## 2023-09-04 MED ORDER — MESALAMINE 800 MG PO TBEC: 3.0000 | DELAYED_RELEASE_TABLET | Freq: Every day | ORAL | 1 refills | Status: AC

## 2023-09-04 NOTE — Telephone Encounter (Signed)
 Pt requesting medication be resent to [facility]   Copied from CRM [id number]. Topic: Clinical - Prescription Issue >> Sep 04, 2023  1:25 PM Kita Perish H wrote: Reason for CRM: Patient called in for a refill request for her clonazePAM  (KLONOPIN ) 1 MG tablet, request asks for refill to be sent to [facility] and medication was sent to [facility]. Please send medication to [facility], thanks.  Chayse [phone]

## 2023-09-04 NOTE — Telephone Encounter (Signed)
 Copied from CRM [id number]. Topic: Clinical - Medication Refill >> Sep 04, 2023  9:44 AM Bambi Bonine D wrote: Medication: clonazePAM  (KLONOPIN ) 1 MG tablet  Has the patient contacted their pharmacy? Yes (Agent: If no, request that the patient contact the pharmacy for the refill. If patient does not wish to contact the pharmacy document the reason why and proceed with request.) (Agent: If yes, when and what did the pharmacy advise?)  This is the patient's preferred pharmacy:  [facility] - [place], [place] - [address] [place] [address] Phone: [phone] Fax: [phone]   Is this the correct pharmacy for this prescription? Yes If no, delete pharmacy and type the correct one.   Has the prescription been filled recently? No  Is the patient out of the medication? No  Has the patient been seen for an appointment in the last year OR does the patient have an upcoming appointment? Yes  Can we respond through MyChart? Yes  Agent: Please be advised that Rx refills may take up to 3 business days. We ask that you follow-up with your pharmacy.

## 2023-09-05 MED ORDER — CLONAZEPAM 1 MG PO TABS: 1.0000 mg | ORAL_TABLET | Freq: Three times a day (TID) | ORAL | 1 refills | Status: DC | PRN

## 2023-09-11 ENCOUNTER — Ambulatory Visit (INDEPENDENT_AMBULATORY_CARE_PROVIDER_SITE_OTHER): Admitting: Family Medicine

## 2023-09-11 ENCOUNTER — Encounter: Payer: Self-pay | Admitting: Family Medicine

## 2023-09-11 VITALS — BP 131/82 | HR 75 | Temp 97.6°F | Ht 64.0 in | Wt 245.2 lb

## 2023-09-11 DIAGNOSIS — I10 Essential (primary) hypertension: Secondary | ICD-10-CM

## 2023-09-11 DIAGNOSIS — L03213 Periorbital cellulitis: Secondary | ICD-10-CM | POA: Diagnosis not present

## 2023-09-11 DIAGNOSIS — M3500 Sicca syndrome, unspecified: Secondary | ICD-10-CM

## 2023-09-11 MED ORDER — DOXYCYCLINE HYCLATE 100 MG PO TABS: 100.0000 mg | ORAL_TABLET | Freq: Two times a day (BID) | ORAL | 0 refills | Status: DC

## 2023-09-11 MED ORDER — POLYMYXIN B-TRIMETHOPRIM 10000-0.1 UNIT/ML-% OP SOLN: 2.0000 [drp] | OPHTHALMIC | 0 refills | Status: AC

## 2023-09-11 NOTE — Assessment & Plan Note (Signed)
 May be contributing to her above recurrent styes.  Recommended gentle washing of her eyelids daily to prevent recurrence.

## 2023-09-11 NOTE — Progress Notes (Signed)
   Melinda Porter is a 53 y.o. female who presents today for an office visit.  Assessment/Plan:  New/Acute Problems: Preseptal cellulitis No red flags.  No signs of orbital cellulitis or systemic infection.  Likely started as a stye several days ago that worsened due to abrasion from her rubbing the area at home she has extensive allergies to antibiotics.  We will start doxycycline  to cover for MRSA.  Also start Polytrim drops.  She cannot tolerate penicillins or sulfa .  She will let us  know if not improving in the next few days.  We discussed reasons to return to care.  Chronic Problems Addressed Today: Hypertension At goal today on losartan  100 mg daily.  Sjogren's syndrome [facility]) May be contributing to her above recurrent styes.  Recommended gentle washing of her eyelids daily to prevent recurrence.     Subjective:  HPI:  See Assessment / plan for status of chronic conditions. Patient here with left eyelid swelling. This started about 4 days ago. Prior to this she was getting some irritation in her left eyelid rubbing the area a lot. She used an OTC eyewash to help with irritation.  The next day woke up with swelling on her left upper eyelid.  This is continued to worsen over the last few days.  She has some pain and irritation of the area.  No treatment tried.  No fevers or chills.  Eyelid is covering her eye partially obscuring her vision.        Objective:  Physical Exam: BP 131/82   Pulse 75   Temp 97.6 F (36.4 C) (Temporal)   Ht 5\' 4"  (1.626 m)   Wt 245 lb 3.2 oz (111.2 kg)   LMP 06/23/2009   SpO2 97%   BMI 42.09 kg/m   Gen: No acute distress, resting comfortably HEENT: Left upper eyelid with edema and erythema.  Extraocular eye movements intact without pain.  No conjunctival erythema.  Stye noted along upper eyelid CV: Regular rate and rhythm with no murmurs appreciated Pulm: Normal work of breathing, clear to auscultation bilaterally with no crackles, wheezes, or  rhonchi Neuro: Grossly normal, moves all extremities Psych: Normal affect and thought content      Demyah Smyre M. Daneil Dunker, MD 09/11/2023 9:12 AM

## 2023-09-11 NOTE — Patient Instructions (Signed)
 It was very nice to see you today!  You have an infection in your eyelid.  This probably started as a stye.  Start the doxycycline  and eyedrops.  Let us  know if not improving by next week.  Return if symptoms worsen or fail to improve.   Take care, Dr Daneil Dunker  PLEASE NOTE:  If you had any lab tests, please let us  know if you have not heard back within a few days. You may see your results on mychart before we have a chance to review them but we will give you a call once they are reviewed by us .   If we ordered any referrals today, please let us  know if you have not heard from their office within the next week.   If you had any urgent prescriptions sent in today, please check with the pharmacy within an hour of our visit to make sure the prescription was transmitted appropriately.   Please try these tips to maintain a healthy lifestyle:  Eat at least 3 REAL meals and 1-2 snacks per day.  Aim for no more than 5 hours between eating.  If you eat breakfast, please do so within one hour of getting up.   Each meal should contain half fruits/vegetables, one quarter protein, and one quarter carbs (no bigger than a computer mouse)  Cut down on sweet beverages. This includes juice, soda, and sweet tea.   Drink at least 1 glass of water with each meal and aim for at least 8 glasses per day  Exercise at least 150 minutes every week.

## 2023-09-11 NOTE — Assessment & Plan Note (Signed)
 At goal today on losartan 100 mg daily.

## 2023-09-19 ENCOUNTER — Encounter: Payer: Self-pay | Admitting: Physician Assistant

## 2023-09-19 ENCOUNTER — Ambulatory Visit: Payer: Self-pay

## 2023-09-19 ENCOUNTER — Telehealth: Payer: Self-pay | Admitting: *Deleted

## 2023-09-19 ENCOUNTER — Ambulatory Visit: Admitting: Physician Assistant

## 2023-09-19 VITALS — BP 130/80 | HR 80 | Temp 98.2°F | Ht 64.0 in | Wt 243.0 lb

## 2023-09-19 DIAGNOSIS — T887XXA Unspecified adverse effect of drug or medicament, initial encounter: Secondary | ICD-10-CM

## 2023-09-19 DIAGNOSIS — Z0289 Encounter for other administrative examinations: Secondary | ICD-10-CM

## 2023-09-19 DIAGNOSIS — L03213 Periorbital cellulitis: Secondary | ICD-10-CM | POA: Diagnosis not present

## 2023-09-19 DIAGNOSIS — H353290 Exudative age-related macular degeneration, unspecified eye, stage unspecified: Secondary | ICD-10-CM

## 2023-09-19 NOTE — Patient Instructions (Addendum)
 It was great to see you!  Continue doxycycline  for another week  We will place new referral to eye doctor  We will hold off on blood work since you have improved  If worsening over weekend, go to the ER  Take care,  Maryjo Ragon PA-C

## 2023-09-19 NOTE — Progress Notes (Signed)
 Melinda Porter is a 53 y.o. female here for a follow up of a pre-existing problem.  History of Present Illness:   Chief Complaint  Patient presents with   Eye Problem    Pt still have problems with left eye, still itching and some swelling. Took last abx last night.    Eye Problem     Macular Degeneration She was getting injections for macular degeneration in her eyes but this became unaffordable She was referred to Dr Ardis Krebs office to re-establish but she cannot return there until she pays her outstanding bills there which she cannot afford She is wanting to see a new provider that is compatible with her insurance   ADA form She has form to be completed today She takes Orencia  weekly and it causes severe fatigue, headache(s) and nausea requiring her to miss work She is requesting that her schedule be such that she works 4 consecutive days in a row so she can have 3 days off in a row to recover from her illness She is planning to see new rheumatologist in a few months   Preseptal cellulitis Saw Primary Care Provider (PCP) on 09/11/23 for this  Was given doxycycline  and polymixin drops She has completed oral antibiotic(s) and is still using polymixin drops She has had improvement of symptom(s) but has not had resolution so she is concerned she may need additional antibiotic(s) Denies: fever, chills, painful eye movement, malaise    Past Medical History:  Diagnosis Date   Allergy     Anemia    Anxiety    Arthritis    Back pain    Bilateral swelling of feet    Cataract    not a surgical candidate at this time (08/22/2022)   Depression    Endometriosis    Fatty liver    Fibromyalgia    GERD (gastroesophageal reflux disease)    Hiatal hernia    Hyperlipemia    Hypertension    IBS (irritable bowel syndrome)    Joint pain    Lupus    Macular degeneration of both eyes    PONV (postoperative nausea and vomiting)    Psoriatic arthritis (HCC)    PTSD (post-traumatic  stress disorder)    Seasonal allergies    Sjogren's disease (HCC)    Sleep-disordered breathing 11/12/2021   Stroke (HCC) 2016   UC (ulcerative colitis) (HCC)    Vitamin D  deficiency      Social History   Tobacco Use   Smoking status: Former    Current packs/day: 0.00    Types: Cigarettes    Quit date: 03/09/1999    Years since quitting: 24.5   Smokeless tobacco: Never  Vaping Use   Vaping status: Never Used  Substance Use Topics   Alcohol use: No    Alcohol/week: 0.0 standard drinks of alcohol   Drug use: No    Past Surgical History:  Procedure Laterality Date   CESAREAN SECTION  1997   COLONOSCOPY  2021   JP-MAC-suprep(exc)-patchy erythema otherwise normal - recall 5 years   FLEXIBLE SIGMOIDOSCOPY N/A 01/06/2017   Procedure: FLEXIBLE SIGMOIDOSCOPY;  Surgeon: Ace Holder, MD;  Location: [facility];  Service: Gastroenterology;  Laterality: N/A;   MOLE REMOVAL     Right forearm   OOPHORECTOMY Bilateral    PARTIAL HYSTERECTOMY  07/2009   TEMPOROMANDIBULAR JOINT SURGERY Right 2009   TONSILLECTOMY  2009    Family History  Problem Relation Age of Onset   Colon polyps Maternal Grandmother  Lung cancer Maternal Grandmother    Liver disease Father        agent orange   Alcoholism Father    Thyroid  disease Mother    Diabetes Maternal Grandfather    Heart disease Maternal Grandfather    Hypertension Maternal Grandfather    Colon cancer Maternal Uncle        great uncle   Breast cancer Neg Hx    Esophageal cancer Neg Hx    Stomach cancer Neg Hx    Rectal cancer Neg Hx     Allergies  Allergen Reactions   Gabapentin  Other (See Comments)    Stroke like symptoms   Lyrica [Pregabalin] Shortness Of Breath   Prednisone  Other (See Comments)    "I get deathly ill"   Remicade  [Infliximab ] Anaphylaxis   Ciprofloxacin  Nausea Only   Morphine Sulfate Itching    Other reaction(s): Unknown   Nsaids Other (See Comments)    Liver problems- can't take tylenol ,  ibuprofen  etc; MD instructed patient to take Aleve?; patient states she tries to take Cymbalta  instead if she is "really hurting"   Sulfa  Antibiotics Rash and Other (See Comments)   Tolmetin Other (See Comments)    Other reaction(s): Other (See Comments) Liver problems- can't take tylenol , ibuprofen  etc.   Codeine Rash    Other reaction(s): Unknown   Humira [Adalimumab] Rash    Worsening of Lupus rash.   Morphine And Codeine Rash    "Wired up"   Penicillins Rash    Has patient had a PCN reaction causing immediate rash, facial/tongue/throat swelling, SOB or lightheadedness with hypotension: NO Has patient had a PCN reaction causing severe rash involving mucus membranes or skin necrosis: NO Has patient had a PCN reaction that required hospitalization NO Has patient had a PCN reaction occurring within the last 10 years: NO If all of the above answers are "NO", then may proceed with Cephalosporin use.    Current Medications:   Current Outpatient Medications:    Apremilast 30 MG TABS, Take 1 tablet by mouth in the morning and at bedtime., Disp: , Rfl:    Ascorbic Acid (VITA-C PO), Take 3 tablets by mouth daily at 6 (six) AM. 3 gummies a day, Disp: , Rfl:    celecoxib  (CELEBREX ) 200 MG capsule, Take 1 capsule (200 mg total) by mouth 2 (two) times daily. (Patient taking differently: Take 200 mg by mouth 2 (two) times daily as needed for moderate pain (pain score 4-6).), Disp: 60 capsule, Rfl: 3   clonazePAM  (KLONOPIN ) 1 MG tablet, Take 1 tablet (1 mg total) by mouth 3 (three) times daily as needed for anxiety., Disp: 270 tablet, Rfl: 1   cromolyn  (OPTICROM ) 4 % ophthalmic solution, Place 1 drop into both eyes 4 (four) times daily. (Patient taking differently: Place 1 drop into both eyes 4 (four) times daily as needed.), Disp: 10 mL, Rfl: 1   cyclobenzaprine  (FLEXERIL ) 10 MG tablet, Take 1-2 tablets (10-20 mg total) by mouth 3 (three) times daily as needed for muscle spasms., Disp: 60 tablet, Rfl:  0   diphenoxylate -atropine  (LOMOTIL ) 2.5-0.025 MG tablet, Take 1 tablet by mouth 2 (two) times daily as needed for diarrhea or loose stools., Disp: 60 tablet, Rfl: 2   ferrous sulfate  324 (65 Fe) MG TBEC, TAKE 1 TABLET(324 MG) BY MOUTH IN THE MORNING AND AT BEDTIME, Disp: 180 tablet, Rfl: 1   folic acid  (FOLVITE ) 1 MG tablet, Take 1 tablet (1 mg total) by mouth daily., Disp: , Rfl:  furosemide  (LASIX ) 20 MG tablet, Take 1 tablet (20 mg total) by mouth daily as needed for edema or fluid., Disp: 30 tablet, Rfl: 3   glucose 4 GM chewable tablet, Chew 1 tablet (4 g total) by mouth as needed for low blood sugar., Disp: 50 tablet, Rfl: 1   halobetasol (ULTRAVATE) 0.05 % cream, Apply 1 Application topically daily as needed., Disp: , Rfl:    losartan  (COZAAR ) 100 MG tablet, TAKE 1 TABLET(100 MG) BY MOUTH DAILY, Disp: 90 tablet, Rfl: 1   meclizine  (ANTIVERT ) 25 MG tablet, Take 1 tablet (25 mg total) by mouth 3 (three) times daily as needed for dizziness., Disp: 30 tablet, Rfl: 0   Mesalamine  (ASACOL  HD) 800 MG TBEC, Take 3 tablets (2,400 mg total) by mouth daily., Disp: 180 tablet, Rfl: 1   mupirocin  ointment (BACTROBAN ) 2 %, Apply 1 Application topically 2 (two) times daily., Disp: 22 g, Rfl: 0   ondansetron  (ZOFRAN ) 8 MG tablet, Take 1 tablet (8 mg total) by mouth every 8 (eight) hours as needed for nausea or vomiting., Disp: 20 tablet, Rfl: 0   pantoprazole  (PROTONIX ) 40 MG tablet, Take 1 tablet (40 mg total) by mouth daily., Disp: 90 tablet, Rfl: 3   Potassium 95 MG TABS, Take 1 tablet by mouth daily at 6 (six) AM., Disp: , Rfl:    sertraline  (ZOLOFT ) 100 MG tablet, Take 1.5 tablets (150 mg total) by mouth daily., Disp: 135 tablet, Rfl: 3   topiramate  (TOPAMAX ) 100 MG tablet, Take 1 tablet (100 mg total) by mouth 2 (two) times daily. TAKE 1 TABLET(100 MG) BY MOUTH TWICE DAILY., Disp: 180 tablet, Rfl: 3   triamcinolone  (NASACORT ) 55 MCG/ACT AERO nasal inhaler, Place 1 spray into the nose daily. Start  with 1 spray each side twice a day for 3 days, then reduce to daily., Disp: 1 each, Rfl: 2   trimethoprim -polymyxin b  (POLYTRIM ) ophthalmic solution, Place 2 drops into the left eye every 4 (four) hours., Disp: 10 mL, Rfl: 0   valACYclovir  (VALTREX ) 1000 MG tablet, Take 2 tablets (2,000 mg total) by mouth as needed. For two doses for cold sores., Disp: 20 tablet, Rfl: 5   Vitamin D , Ergocalciferol , (DRISDOL ) 1.25 MG (50000 UNIT) CAPS capsule, Take 1 capsule (50,000 Units total) by mouth every 7 (seven) days., Disp: 12 capsule, Rfl: 3   zinc gluconate 50 MG tablet, Take 50 mg by mouth daily., Disp: , Rfl:    Review of Systems:   Negative unless otherwise specified per HPI.  Vitals:   Vitals:   09/19/23 1143  BP: 130/80  Pulse: 80  Temp: 98.2 F (36.8 C)  TempSrc: Temporal  SpO2: 97%  Weight: 243 lb (110.2 kg)  Height: 5\' 4"  (1.626 m)     Body mass index is 41.71 kg/m.  Physical Exam:   Physical Exam Vitals and nursing note reviewed.  Constitutional:      General: She is not in acute distress.    Appearance: She is well-developed. She is not ill-appearing or toxic-appearing.  Eyes:     Extraocular Movements: Extraocular movements intact.     Conjunctiva/sclera:     Right eye: Right conjunctiva is injected.     Comments: Mild erythema and swelling to middle aspect of left upper eyelid (greatly improved from picture she showed me of initial symptom(s))  Cardiovascular:     Rate and Rhythm: Normal rate and regular rhythm.     Pulses: Normal pulses.     Heart sounds: Normal heart sounds,  S1 normal and S2 normal.  Pulmonary:     Effort: Pulmonary effort is normal.     Breath sounds: Normal breath sounds.  Skin:    General: Skin is warm and dry.  Neurological:     Mental Status: She is alert.     GCS: GCS eye subscore is 4. GCS verbal subscore is 5. GCS motor subscore is 6.  Psychiatric:        Speech: Speech normal.        Behavior: Behavior normal. Behavior is  cooperative.      Assessment and Plan:   1. Exudative age-related macular degeneration, unspecified laterality, unspecified stage (HCC) (Primary) - Ambulatory referral to Ophthalmology  Will place new referral per patient request  2. Encounter for completion of form with patient  We have completed form today to help substantiate need for schedule change She has taken form with her to submit to her employer  3. Medication side effects  She plans to follow up with her rheumatology new provider appointment to discuss ongoing side effect(s) and management of Orencia   4. Preseptal cellulitis   Improved Will extend doxycycline  for her ongoing symptom(s) She actually brought in a prior script to the appointment today that is still in date and has 16 tablets -- I advised her to take this for a full week Continue drops if helpful If worsening symptom(s), needs to go to the ER as she has multiple medication allergies that limits outpt management   I spent a total of 42 minutes on this visit, today 09/19/23, which included reviewing previous notes from PCP, ordering tests, discussing plan of care with patient and using shared-decision making on next steps, completing paperwork, and documenting the findings in the note.   [name], PA-C

## 2023-09-19 NOTE — Telephone Encounter (Signed)
 Appt today

## 2023-09-19 NOTE — Telephone Encounter (Signed)
 Spoke to Melinda Porter, can you call Melinda Porter -- let her know that I talked with Melinda Porter and if her eye is not improving, she probably actually needs to go to the ER for evaluation -- will likely need imaging and possibly IV abx since she has so many allergies to oral abx. Melinda Porter said it is improving just doesn't know if she needs a couple more days of antibiotics. Told Melinda Porter to hang on put on hold, came back. Told Melinda Porter discussed with Melinda Porter and she said okay keep appt and we will evaluate then. Melinda Porter verbalized understanding.

## 2023-09-19 NOTE — Telephone Encounter (Signed)
 FYI Only or Action Required?: FYI only for provider  Patient was last seen in primary care on 09/11/2023 by Melinda Clamp, MD. Called Nurse Triage reporting Eye Problem. Symptoms began 09/07/2023. Interventions attempted: Prescription medications: oral antibiotics, ophthalmic antibiotic solution and Rest, hydration, or home remedies. Symptoms are: gradually improving.  Triage Disposition: See Physician Within 24 Hours  Patient/caregiver understands and will follow disposition?: Yes        Appointment made for today 09/19/2023 at 11:20 AM with Melinda Iba, PA.                 Copied from CRM [id number]. Topic: Clinical - Red Word Triage >> Sep 19, 2023  7:47 AM Melinda Porter wrote: Red Word that prompted transfer to Nurse Triage: Patient is calling about eye infection she is stating that she was placed on antibiotics and took last of meds last night and it is still the same a little bettr. Reason for Disposition  Eyelid is red and painful (or tender to touch)  Answer Assessment - Initial Assessment Questions 1. ONSET: "When did the pain start?" (e.g., minutes, hours, days)     09/07/23 2. TIMING: "Does the pain come and go, or has it been constant since it started?" (e.g., constant, intermittent, fleeting)     If touched, there is pain 3. SEVERITY: "How bad is the pain?"   (Scale 1-10; mild, moderate or severe)   - MILD (1-3): doesn't interfere with normal activities    - MODERATE (4-7): interferes with normal activities or awakens from sleep    - SEVERE (8-10): excruciating pain and patient unable to do normal activities     Hurts if touched 4. LOCATION: "Where does it hurt?"  (e.g., eyelid, eye, cheekbone)     Left eyelid 5. CAUSE: "What do you think is causing the pain?"     infection 6. VISION: "Do you have blurred vision or changes in your vision?"      "Seems like a film over the eye" 7. EYE DISCHARGE: "Is there any discharge (pus) from the eye(s)?"  If Yes, ask:  "What color is it?"      "Yucky goops up especially in the morning" 8. FEVER: "Do you have a fever?" If Yes, ask: "What is it, how was it measured, and when did it start?"      "I don't think so" 9. OTHER SYMPTOMS: "Do you have any other symptoms?" (e.g., headache, nasal discharge, facial rash)     Itching  Patient finished with antibiotics Eye still not completely better Left Eye  Answer Assessment - Initial Assessment Questions 1. ONSET: "When did the swelling start?" (e.g., minutes, hours, days)     09/07/23 2. LOCATION: "What part of the eyelids is swollen?"     Left eyelid 3. SEVERITY: "How swollen is it?"     Not as bad as previous visit at the office for this issue last week and given antibiotics 4. ITCHING: "Is there any itching?" If Yes, ask: "How much?"   (Scale 1-10; mild, moderate or severe)     Yes itching is present 5. PAIN: "Is the swelling painful to touch?" If Yes, ask: "How painful is it?"   (Scale 1-10; mild, moderate or severe)     "It hurts if you touch it but it's just itching mostly" 6. FEVER: "Do you have a fever?" If Yes, ask: "What is it, how was it measured, and when did it start?"      no 7. CAUSE: "What  do you think is causing the swelling?"     Diagnosed with infection at office visit on 09/11/2023 with Dr Melinda Porter 8. RECURRENT SYMPTOM: "Have you had eyelid swelling before?" If Yes, ask: "When was the last time?" "What happened that time?"     ----- 9. OTHER SYMPTOMS: "Do you have any other symptoms?" (e.g., blurred vision, eye discharge, rash, runny nose)     Some drainage---no color to it, "a film over eye"  Protocols used: Eye Pain and Other Symptoms-A-AH, Eye - Swelling-A-AH

## 2023-09-26 ENCOUNTER — Encounter: Payer: Commercial Managed Care - PPO | Admitting: Family Medicine

## 2023-09-30 ENCOUNTER — Other Ambulatory Visit: Payer: Self-pay | Admitting: Family Medicine

## 2023-09-30 DIAGNOSIS — I10 Essential (primary) hypertension: Secondary | ICD-10-CM

## 2023-09-30 DIAGNOSIS — K219 Gastro-esophageal reflux disease without esophagitis: Secondary | ICD-10-CM

## 2023-09-30 MED ORDER — TOPIRAMATE 100 MG PO TABS: 100.0000 mg | ORAL_TABLET | Freq: Two times a day (BID) | ORAL | 3 refills | Status: DC

## 2023-09-30 MED ORDER — LOSARTAN POTASSIUM 100 MG PO TABS: ORAL_TABLET | ORAL | 1 refills | Status: DC

## 2023-09-30 MED ORDER — SERTRALINE HCL 100 MG PO TABS: 150.0000 mg | ORAL_TABLET | Freq: Every day | ORAL | 3 refills | Status: DC

## 2023-09-30 MED ORDER — PANTOPRAZOLE SODIUM 40 MG PO TBEC: 40.0000 mg | DELAYED_RELEASE_TABLET | Freq: Every day | ORAL | 3 refills | Status: DC

## 2023-09-30 NOTE — Telephone Encounter (Signed)
 Copied from CRM [id number]. Topic: Clinical - Medication Refill >> Sep 30, 2023 11:35 AM Trula Gable C wrote: Medication: pantoprazole  (PROTONIX ) 40 MG tablet losartan  (COZAAR ) 100 MG tablet sertraline  (ZOLOFT ) 100 MG tablet topiramate  (TOPAMAX ) 100 MG tablet   Has the patient contacted their pharmacy? Yes (Agent: If no, request that the patient contact the pharmacy for the refill. If patient does not wish to contact the pharmacy document the reason why and proceed with request.) (Agent: If yes, when and what did the pharmacy advise?)  This is the patient's preferred pharmacy:    [id number] [place], [place] - [address] AT [facility] OF [facility] [address] [facility] [address] [place] [address] Phone: [phone] Fax: [phone]  Is this the correct pharmacy for this prescription? Yes If no, delete pharmacy and type the correct one.   Has the prescription been filled recently? No  Is the patient out of the medication? Yes  Has the patient been seen for an appointment in the last year OR does the patient have an upcoming appointment? Yes  Can we respond through MyChart? Yes  Agent: Please be advised that Rx refills may take up to 3 business days. We ask that you follow-up with your pharmacy.

## 2023-10-01 ENCOUNTER — Other Ambulatory Visit: Payer: Self-pay | Admitting: *Deleted

## 2023-10-01 ENCOUNTER — Telehealth: Payer: Self-pay | Admitting: Family Medicine

## 2023-10-01 DIAGNOSIS — H353290 Exudative age-related macular degeneration, unspecified eye, stage unspecified: Secondary | ICD-10-CM

## 2023-10-01 NOTE — Telephone Encounter (Signed)
 New referral placed for Ophthalmology

## 2023-10-01 NOTE — Telephone Encounter (Signed)
 Copied from CRM [id number]. Topic: Referral - Status >> Oct 01, 2023 12:35 PM Juleen Oakland F wrote: Reason for CRM: Patient called to request that a new referral to be put in to Opthamology since cannot go to Dr. Ardis Krebs office due to a past due balance. She would like the new referral sent to [facility] and [facility] - Dr. Seward Dao [phone].

## 2023-10-03 ENCOUNTER — Ambulatory Visit (HOSPITAL_BASED_OUTPATIENT_CLINIC_OR_DEPARTMENT_OTHER)

## 2023-10-03 DIAGNOSIS — R600 Localized edema: Secondary | ICD-10-CM | POA: Diagnosis not present

## 2023-10-03 LAB — ECHOCARDIOGRAM COMPLETE
AR max vel: 1.63 cm2
AV Area VTI: 1.69 cm2
AV Area mean vel: 1.6 cm2
AV Mean grad: 4 mmHg
AV Peak grad: 8.5 mmHg
Ao pk vel: 1.46 m/s
Area-P 1/2: 3.01 cm2
S' Lateral: 3.15 cm

## 2023-10-06 NOTE — Telephone Encounter (Signed)
 Referral faxed to Dr. Elner per patient request.

## 2023-10-07 NOTE — Progress Notes (Signed)
 Echocardiogram shows normal pumping function.  This is a reassuring echocardiogram.  She should let us  know if she still having ongoing issues with swelling in her feet.

## 2023-10-09 ENCOUNTER — Telehealth: Payer: Self-pay | Admitting: *Deleted

## 2023-10-09 NOTE — Telephone Encounter (Signed)
 Copied from CRM [id number]. Topic: Referral - Status >> Oct 08, 2023  1:34 PM Ernestene P wrote: Reason for CRM: Pt advise she contacted [facility] office and they advise they do not have the referral- It would need to be resent.

## 2023-10-09 NOTE — Telephone Encounter (Signed)
 Send information to [place], referral coordination to resend referral

## 2023-10-09 NOTE — Telephone Encounter (Signed)
 LVM to patient notified referral was send on 10/01/2023 [facility] - Dr. Elner [phone].

## 2023-10-09 NOTE — Telephone Encounter (Unsigned)
 Copied from CRM [id number]. Topic: Referral - Status >> Oct 08, 2023  1:34 PM Ernestene P wrote: Reason for CRM: Pt advise she contacted [facility] office and they advise they do not have the referral- It would need to be resent. >> Oct 09, 2023  3:17 PM Thersia C wrote: Patient called in stated they can't get it through [facility], needs it faxed to them

## 2023-10-20 NOTE — Telephone Encounter (Signed)
 Olam,  Referral has been faxed to Dr. Georgette office.   He is no longer an internal [facility] provider. All referral going to is office must be faxed

## 2023-10-24 ENCOUNTER — Encounter: Admitting: Family Medicine

## 2023-11-02 ENCOUNTER — Telehealth: Admitting: Family

## 2023-11-02 DIAGNOSIS — B9689 Other specified bacterial agents as the cause of diseases classified elsewhere: Secondary | ICD-10-CM

## 2023-11-02 DIAGNOSIS — J019 Acute sinusitis, unspecified: Secondary | ICD-10-CM

## 2023-11-02 DIAGNOSIS — J069 Acute upper respiratory infection, unspecified: Secondary | ICD-10-CM

## 2023-11-02 MED ORDER — FLUTICASONE PROPIONATE 50 MCG/ACT NA SUSP
2.0000 | Freq: Every day | NASAL | 6 refills | Status: AC
Start: 2023-11-02 — End: ?

## 2023-11-02 MED ORDER — DOXYCYCLINE HYCLATE 100 MG PO TABS: 100.0000 mg | ORAL_TABLET | Freq: Two times a day (BID) | ORAL | 0 refills | Status: DC

## 2023-11-02 MED ORDER — BENZONATATE 100 MG PO CAPS: 100.0000 mg | ORAL_CAPSULE | Freq: Three times a day (TID) | ORAL | 0 refills | Status: DC | PRN

## 2023-11-02 NOTE — Progress Notes (Signed)
 Virtual Visit Consent   Melinda Porter, you are scheduled for a virtual visit with a [facility] provider today. Just as with appointments in the office, your consent must be obtained to participate. Your consent will be active for this visit and any virtual visit you may have with one of our providers in the next 365 days. If you have a MyChart account, a copy of this consent can be sent to you electronically.  As this is a virtual visit, video technology does not allow for your provider to perform a traditional examination. This may limit your provider's ability to fully assess your condition. If your provider identifies any concerns that need to be evaluated in person or the need to arrange testing (such as labs, EKG, etc.), we will make arrangements to do so. Although advances in technology are sophisticated, we cannot ensure that it will always work on either your end or our end. If the connection with a video visit is poor, the visit may have to be switched to a telephone visit. With either a video or telephone visit, we are not always able to ensure that we have a secure connection.  By engaging in this virtual visit, you consent to the provision of healthcare and authorize for your insurance to be billed (if applicable) for the services provided during this visit. Depending on your insurance coverage, you may receive a charge related to this service.  I need to obtain your verbal consent now. Are you willing to proceed with your visit today? Melinda Porter has provided verbal consent on 11/02/2023 for a virtual visit (video or telephone). Bari Learn, FNP  Date: 11/02/2023 7:17 PM   Virtual Visit via Video Note   I, Bari Learn, connected with  Melinda Porter  ([id number], November 15, 1970) on 11/02/23 at  7:15 PM EDT by a video-enabled telemedicine application and verified that I am speaking with the correct person using two identifiers.  Location: Patient: Virtual Visit Location Patient:  Home Provider: Virtual Visit Location Provider: Home Office   I discussed the limitations of evaluation and management by telemedicine and the availability of in person appointments. The patient expressed understanding and agreed to proceed.    History of Present Illness: Melinda Porter is a 53 y.o. who identifies as a female who was assigned female at birth, and is being seen today for sinus congestion and pain. Took a COVID test that was negative.   HPI: Sinusitis This is a new problem. The current episode started in the past 7 days. The problem has been gradually worsening since onset. There has been no fever. Her pain is at a severity of 10/10. The pain is moderate. Associated symptoms include headaches, sinus pressure and sneezing. Pertinent negatives include no congestion, coughing, ear pain, hoarse voice, shortness of breath, sore throat or swollen glands. Past treatments include saline nose sprays. The treatment provided mild relief.    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   Recurrent low back pain 09/20/2022   Hepatic steatosis 04/24/2022   Multiple skin nodules 04/24/2022   Thrombocytopenia (HCC) 04/05/2022   Splenomegaly 04/05/2022   Sleep-disordered breathing 11/12/2021   Elevated lipase 10/23/2021   Prediabetes 01/01/2021   Morbid obesity (HCC) 07/10/2020   Depression, major, single episode, moderate (HCC) 05/29/2020   Anxiety 05/29/2020   PTSD (post-traumatic stress disorder) 05/29/2020   Vitamin D  deficiency 05/29/2020   Iron  deficiency anemia 05/29/2020   GERD (gastroesophageal reflux disease) 05/29/2020   Sjogren's syndrome (HCC) 05/29/2020   Seasonal allergies 05/29/2020   Vitamin B12 deficiency 05/29/2020   Cold sore 05/29/2020   Systemic lupus erythematosus (HCC) 12/29/2019   Ulcerative colitis (HCC) 01/05/2017   Persistent headaches 11/05/2014   Facial droop  11/05/2014   Syncope 11/05/2014   Hypertension 11/05/2014   Hyperlipemia 11/05/2014   Psoriatic arthritis (HCC) 11/05/2014   Hypoglycemia 04/13/2014    Allergies:  Allergies  Allergen Reactions   Gabapentin  Other (See Comments)    Stroke like symptoms   Lyrica [Pregabalin] Shortness Of Breath   Prednisone  Other (See Comments)    I get deathly ill   Remicade  [Infliximab ] Anaphylaxis   Ciprofloxacin  Nausea Only   Morphine Sulfate Itching    Other reaction(s): Unknown   Nsaids Other (See Comments)    Liver problems- can't take tylenol , ibuprofen  etc; MD instructed patient to take Aleve?; patient states she tries to take Cymbalta  instead if she is really hurting   Sulfa  Antibiotics Rash and Other (See Comments)   Tolmetin Other (See Comments)    Other reaction(s): Other (See Comments) Liver problems- can't take tylenol , ibuprofen  etc.   Codeine Rash    Other reaction(s): Unknown   Humira [Adalimumab] Rash    Worsening of Lupus rash.   Morphine And Codeine Rash    Wired up   Penicillins Rash    Has patient had a PCN reaction causing immediate rash, facial/tongue/throat swelling, SOB or lightheadedness with hypotension: NO Has patient had a PCN reaction causing severe rash involving mucus membranes or skin necrosis: NO Has patient had a PCN reaction that required hospitalization NO Has patient had a PCN reaction occurring within the last 10 years: NO If all of the above answers are NO, then may proceed with Cephalosporin use.   Medications:  Current Outpatient Medications:    doxycycline  (VIBRA -TABS) 100 MG tablet, Take 1 tablet (100 mg total) by mouth 2 (two) times daily., Disp: 20 tablet, Rfl: 0   Apremilast 30 MG TABS, Take 1 tablet by mouth in the morning and at bedtime., Disp: , Rfl:    Ascorbic Acid (VITA-C PO), Take 3 tablets by mouth daily at 6 (six) AM. 3 gummies a day, Disp: , Rfl:    benzonatate  (TESSALON  PERLES) 100 MG capsule, Take 1 capsule (100 mg total) by  mouth 3 (three) times daily as needed., Disp: 20 capsule, Rfl: 0   celecoxib  (CELEBREX ) 200 MG capsule, Take 1 capsule (200 mg total) by mouth 2 (two) times daily. (Patient taking differently: Take 200 mg by mouth 2 (two) times daily as needed for moderate pain (pain score 4-6).), Disp: 60 capsule, Rfl: 3   clonazePAM  (KLONOPIN ) 1 MG tablet, Take 1 tablet (1 mg total) by mouth 3 (three) times daily as needed for anxiety., Disp: 270 tablet, Rfl: 1   cromolyn  (OPTICROM ) 4 % ophthalmic solution, Place 1 drop into both eyes 4 (four) times daily. (Patient taking differently: Place 1 drop into both eyes 4 (four) times daily as needed.), Disp: 10 mL, Rfl: 1   cyclobenzaprine  (FLEXERIL ) 10 MG tablet, Take 1-2 tablets (10-20 mg total) by mouth 3 (three) times daily as needed for muscle spasms., Disp: 60 tablet, Rfl: 0   diphenoxylate -atropine  (LOMOTIL ) 2.5-0.025 MG tablet, Take 1 tablet by mouth 2 (two) times daily as needed for diarrhea or loose stools., Disp: 60 tablet, Rfl: 2   ferrous sulfate  324 (65 Fe) MG TBEC, TAKE 1 TABLET(324 MG)  BY MOUTH IN THE MORNING AND AT BEDTIME, Disp: 180 tablet, Rfl: 1   fluticasone  (FLONASE ) 50 MCG/ACT nasal spray, Place 2 sprays into both nostrils daily., Disp: 16 g, Rfl: 6   folic acid  (FOLVITE ) 1 MG tablet, Take 1 tablet (1 mg total) by mouth daily., Disp: , Rfl:    furosemide  (LASIX ) 20 MG tablet, Take 1 tablet (20 mg total) by mouth daily as needed for edema or fluid., Disp: 30 tablet, Rfl: 3   glucose 4 GM chewable tablet, Chew 1 tablet (4 g total) by mouth as needed for low blood sugar., Disp: 50 tablet, Rfl: 1   halobetasol (ULTRAVATE) 0.05 % cream, Apply 1 Application topically daily as needed., Disp: , Rfl:    losartan  (COZAAR ) 100 MG tablet, TAKE 1 TABLET(100 MG) BY MOUTH DAILY, Disp: 90 tablet, Rfl: 1   meclizine  (ANTIVERT ) 25 MG tablet, Take 1 tablet (25 mg total) by mouth 3 (three) times daily as needed for dizziness., Disp: 30 tablet, Rfl: 0   Mesalamine  (ASACOL   HD) 800 MG TBEC, Take 3 tablets (2,400 mg total) by mouth daily., Disp: 180 tablet, Rfl: 1   mupirocin  ointment (BACTROBAN ) 2 %, Apply 1 Application topically 2 (two) times daily., Disp: 22 g, Rfl: 0   ondansetron  (ZOFRAN ) 8 MG tablet, Take 1 tablet (8 mg total) by mouth every 8 (eight) hours as needed for nausea or vomiting., Disp: 20 tablet, Rfl: 0   pantoprazole  (PROTONIX ) 40 MG tablet, Take 1 tablet (40 mg total) by mouth daily., Disp: 90 tablet, Rfl: 3   Potassium 95 MG TABS, Take 1 tablet by mouth daily at 6 (six) AM., Disp: , Rfl:    sertraline  (ZOLOFT ) 100 MG tablet, Take 1.5 tablets (150 mg total) by mouth daily., Disp: 135 tablet, Rfl: 3   topiramate  (TOPAMAX ) 100 MG tablet, Take 1 tablet (100 mg total) by mouth 2 (two) times daily. TAKE 1 TABLET(100 MG) BY MOUTH TWICE DAILY., Disp: 180 tablet, Rfl: 3   triamcinolone  (NASACORT ) 55 MCG/ACT AERO nasal inhaler, Place 1 spray into the nose daily. Start with 1 spray each side twice a day for 3 days, then reduce to daily., Disp: 1 each, Rfl: 2   trimethoprim -polymyxin b  (POLYTRIM ) ophthalmic solution, Place 2 drops into the left eye every 4 (four) hours., Disp: 10 mL, Rfl: 0   valACYclovir  (VALTREX ) 1000 MG tablet, Take 2 tablets (2,000 mg total) by mouth as needed. For two doses for cold sores., Disp: 20 tablet, Rfl: 5   Vitamin D , Ergocalciferol , (DRISDOL ) 1.25 MG (50000 UNIT) CAPS capsule, Take 1 capsule (50,000 Units total) by mouth every 7 (seven) days., Disp: 12 capsule, Rfl: 3   zinc gluconate 50 MG tablet, Take 50 mg by mouth daily., Disp: , Rfl:   Observations/Objective: Patient is well-developed, well-nourished in no acute distress.  Resting comfortably  at home.  Head is normocephalic, atraumatic.  No labored breathing.  Speech is clear and coherent with logical content.  Patient is alert and oriented at baseline.  Maxillary pain    Assessment and Plan: 1. Acute bacterial sinusitis (Primary) - doxycycline  (VIBRA -TABS) 100 MG  tablet; Take 1 tablet (100 mg total) by mouth 2 (two) times daily.  Dispense: 20 tablet; Refill: 0  - Take meds as prescribed - Use a cool mist humidifier  -Use saline nose sprays frequently -Force fluids -For any cough or congestion  Use plain Mucinex- regular strength or max strength is fine -For fever or aces or pains- take tylenol  or ibuprofen . -  Throat lozenges if help -Follow up if symptoms worsen or do not improve  -Recommend follow up with allergen for testing   Follow Up Instructions: I discussed the assessment and treatment plan with the patient. The patient was provided an opportunity to ask questions and all were answered. The patient agreed with the plan and demonstrated an understanding of the instructions.  A copy of instructions were sent to the patient via MyChart unless otherwise noted below.     The patient was advised to call back or seek an in-person evaluation if the symptoms worsen or if the condition fails to improve as anticipated.    Bari Learn, FNP

## 2023-11-02 NOTE — Progress Notes (Signed)

## 2023-11-13 ENCOUNTER — Telehealth: Payer: Self-pay | Admitting: Family Medicine

## 2023-11-13 NOTE — Telephone Encounter (Signed)
 Spoke with patient, patient notified FMLA form re-faxed to [phone]

## 2023-11-13 NOTE — Telephone Encounter (Unsigned)
 Copied from CRM [id number]. Topic: General - Other >> Nov 13, 2023  8:06 AM Willma SAUNDERS wrote: Reason for CRM: Patient states she was in around the end of April beginning of May and had intermittent FMLA paperwork filled out. She was just contacted by her company and advised the paperwork was filled out incorrectly.  Paperwork is supposed to say she is permitted 3 episodes per month but it states 3 episodes per every 3 month. Patient is requesting the paperwork be updated. If new paperwork is needed patient can send.  Patient can be reached at [phone]

## 2023-11-20 ENCOUNTER — Telehealth: Payer: Self-pay | Admitting: *Deleted

## 2023-11-20 NOTE — Telephone Encounter (Signed)
 Copied from CRM [id number]. Topic: Referral - Question >> Nov 20, 2023 11:24 AM Rea BROCKS wrote: Reason for CRM: Patient was referred to the office [facility]. The clinic called and stated that they are out of network, and another referral would need to be placed for patients care. A voicemail was left to the patient as well.  Spoke with patient advise to call insurance for specialist that cover her insurance  Verbalized understanding,  [facility]

## 2023-12-12 ENCOUNTER — Other Ambulatory Visit: Payer: Self-pay | Admitting: Family Medicine

## 2023-12-12 ENCOUNTER — Telehealth: Payer: Self-pay | Admitting: Family Medicine

## 2023-12-12 DIAGNOSIS — F419 Anxiety disorder, unspecified: Secondary | ICD-10-CM

## 2023-12-12 DIAGNOSIS — E559 Vitamin D deficiency, unspecified: Secondary | ICD-10-CM

## 2023-12-12 MED ORDER — CLONAZEPAM 1 MG PO TABS: 1.0000 mg | ORAL_TABLET | Freq: Three times a day (TID) | ORAL | 1 refills | Status: DC | PRN

## 2023-12-12 MED ORDER — CLONAZEPAM 1 MG PO TABS
1.0000 mg | ORAL_TABLET | Freq: Three times a day (TID) | ORAL | 1 refills | Status: DC | PRN
Start: 2023-12-12 — End: 2023-12-12

## 2023-12-12 MED ORDER — VITAMIN D (ERGOCALCIFEROL) 1.25 MG (50000 UNIT) PO CAPS: 50000.0000 [IU] | ORAL_CAPSULE | ORAL | 3 refills | Status: DC

## 2023-12-12 MED ORDER — CELECOXIB 200 MG PO CAPS: 200.0000 mg | ORAL_CAPSULE | Freq: Two times a day (BID) | ORAL | 3 refills | Status: DC | PRN

## 2023-12-12 NOTE — Telephone Encounter (Signed)
 Copied from CRM [id number]. Topic: Clinical - Medication Refill >> Dec 12, 2023 12:30 PM Lauren C wrote: Medication: celecoxib  (CELEBREX ) 200 MG capsule  Has the patient contacted their pharmacy? No (Agent: If no, request that the patient contact the pharmacy for the refill. If patient does not wish to contact the pharmacy document the reason why and proceed with request.) (Agent: If yes, when and what did the pharmacy advise?)  This is the patient's preferred pharmacy:  [facility] - [place] - [address] [place] [address] Phone: [phone] Fax: [phone]   Is this the correct pharmacy for this prescription? Yes If no, delete pharmacy and type the correct one.   Has the prescription been filled recently? Yes  Is the patient out of the medication? Yes  Has the patient been seen for an appointment in the last year OR does the patient have an upcoming appointment? Yes  Can we respond through MyChart? No  Agent: Please be advised that Rx refills may take up to 3 business days. We ask that you follow-up with your pharmacy.

## 2023-12-12 NOTE — Telephone Encounter (Signed)
 Copied from CRM [id number]. Topic: Clinical - Medication Refill >> Dec 12, 2023 11:09 AM Carlyon D wrote: Medication: Vitamin D , Ergocalciferol , (DRISDOL ) 1.25 MG (50000 UNIT) CAPS capsule  clonazePAM  (KLONOPIN ) 1 MG tablet   Has the patient contacted their pharmacy? No (Agent: If no, request that the patient contact the pharmacy for the refill. If patient does not wish to contact the pharmacy document the reason why and proceed with request.) (Agent: If yes, when and what did the pharmacy advise?)  This is the patient's preferred pharmacy:  [facility] - [place], [place] - [address] [place] [address] Phone: [phone] Fax: [phone]   Is this the correct pharmacy for this prescription? Yes If no, delete pharmacy and type the correct one.   Has the prescription been filled recently? No  Is the patient out of the medication? Yes  Has the patient been seen for an appointment in the last year OR does the patient have an upcoming appointment? Yes  Can we respond through MyChart? Yes  Agent: Please be advised that Rx refills may take up to 3 business days. We ask that you follow-up with your pharmacy.

## 2023-12-16 ENCOUNTER — Telehealth: Payer: Self-pay | Admitting: Internal Medicine

## 2023-12-16 ENCOUNTER — Other Ambulatory Visit: Payer: Self-pay | Admitting: *Deleted

## 2023-12-16 ENCOUNTER — Telehealth: Payer: Self-pay | Admitting: *Deleted

## 2023-12-16 MED ORDER — FERROUS SULFATE 324 (65 FE) MG PO TBEC: DELAYED_RELEASE_TABLET | ORAL | 0 refills | Status: DC

## 2023-12-16 MED ORDER — CELECOXIB 200 MG PO CAPS: 200.0000 mg | ORAL_CAPSULE | Freq: Two times a day (BID) | ORAL | 0 refills | Status: DC | PRN

## 2023-12-16 NOTE — Telephone Encounter (Signed)
Prescription sent to patient's mail order pharmacy.  

## 2023-12-16 NOTE — Telephone Encounter (Signed)
 Copied from CRM [id number]. Topic: Clinical - Prescription Issue >> Dec 16, 2023  8:39 AM Melinda Porter wrote: Reason for CRM: Patient called in stating celecoxib  (CELEBREX ) 200 MG capsule needs to be the 90 day supply to [facility] in order for her insurance to cover, but only a 30 day supply was sent.   New prescription send to pharmacy  [facility]

## 2023-12-16 NOTE — Telephone Encounter (Signed)
 Inbound call from pt requesting a refill on her medication for Ferrous Sulfate  324 (65 FE). Patient stated that she is [facility] not [facility] anything less then that. Please advise.

## 2023-12-23 ENCOUNTER — Telehealth: Payer: Self-pay | Admitting: *Deleted

## 2023-12-23 NOTE — Telephone Encounter (Signed)
 Copied from CRM [id number]. Topic: Clinical - Prescription Issue >> Dec 22, 2023 10:09 AM Aleatha BROCKS wrote: Reason for CRM:  Patient called in stating celecoxib  (CELEBREX ) 200 MG capsule and [facility] Dr Kennyth to see why patient is taking it before they can send it they're information for further [facility] - [place], [place] - [address] [place] [address] Phone: [phone] Fax: [phone]

## 2023-12-23 NOTE — Telephone Encounter (Signed)
 She is taking it for back and joint pain due to psoriatic arthritis.

## 2023-12-24 NOTE — Telephone Encounter (Signed)
 Prescription was send in on 12/16/2023

## 2024-01-01 ENCOUNTER — Encounter: Payer: Self-pay | Admitting: Family Medicine

## 2024-01-01 ENCOUNTER — Ambulatory Visit: Admitting: Family Medicine

## 2024-01-01 VITALS — BP 118/82 | HR 90 | Temp 98.8°F | Ht 64.0 in | Wt 247.0 lb

## 2024-01-01 DIAGNOSIS — Z0001 Encounter for general adult medical examination with abnormal findings: Secondary | ICD-10-CM

## 2024-01-01 DIAGNOSIS — R600 Localized edema: Secondary | ICD-10-CM

## 2024-01-01 DIAGNOSIS — D509 Iron deficiency anemia, unspecified: Secondary | ICD-10-CM

## 2024-01-01 DIAGNOSIS — M545 Low back pain, unspecified: Secondary | ICD-10-CM

## 2024-01-01 DIAGNOSIS — F419 Anxiety disorder, unspecified: Secondary | ICD-10-CM | POA: Diagnosis not present

## 2024-01-01 DIAGNOSIS — Z0000 Encounter for general adult medical examination without abnormal findings: Secondary | ICD-10-CM

## 2024-01-01 DIAGNOSIS — Z0100 Encounter for examination of eyes and vision without abnormal findings: Secondary | ICD-10-CM

## 2024-01-01 DIAGNOSIS — R7303 Prediabetes: Secondary | ICD-10-CM | POA: Diagnosis not present

## 2024-01-01 DIAGNOSIS — Z131 Encounter for screening for diabetes mellitus: Secondary | ICD-10-CM

## 2024-01-01 DIAGNOSIS — E785 Hyperlipidemia, unspecified: Secondary | ICD-10-CM

## 2024-01-01 DIAGNOSIS — E559 Vitamin D deficiency, unspecified: Secondary | ICD-10-CM

## 2024-01-01 DIAGNOSIS — I10 Essential (primary) hypertension: Secondary | ICD-10-CM

## 2024-01-01 DIAGNOSIS — F321 Major depressive disorder, single episode, moderate: Secondary | ICD-10-CM

## 2024-01-01 DIAGNOSIS — M3219 Other organ or system involvement in systemic lupus erythematosus: Secondary | ICD-10-CM

## 2024-01-01 DIAGNOSIS — Z23 Encounter for immunization: Secondary | ICD-10-CM | POA: Diagnosis not present

## 2024-01-01 DIAGNOSIS — M62838 Other muscle spasm: Secondary | ICD-10-CM

## 2024-01-01 DIAGNOSIS — L4050 Arthropathic psoriasis, unspecified: Secondary | ICD-10-CM

## 2024-01-01 DIAGNOSIS — M3500 Sicca syndrome, unspecified: Secondary | ICD-10-CM

## 2024-01-01 DIAGNOSIS — E538 Deficiency of other specified B group vitamins: Secondary | ICD-10-CM

## 2024-01-01 DIAGNOSIS — K51919 Ulcerative colitis, unspecified with unspecified complications: Secondary | ICD-10-CM

## 2024-01-01 LAB — COMPREHENSIVE METABOLIC PANEL WITH GFR
ALT: 25 U/L (ref 0–35)
AST: 23 U/L (ref 0–37)
Albumin: 4.4 g/dL (ref 3.5–5.2)
Alkaline Phosphatase: 82 U/L (ref 39–117)
BUN: 16 mg/dL (ref 6–23)
CO2: 22 meq/L (ref 19–32)
Calcium: 9 mg/dL (ref 8.4–10.5)
Chloride: 106 meq/L (ref 96–112)
Creatinine, Ser: 0.65 mg/dL (ref 0.40–1.20)
GFR: 101.91 mL/min (ref >60.00)
Glucose, Bld: 142 mg/dL — ABNORMAL HIGH (ref 70–99)
Potassium: 4 meq/L (ref 3.5–5.1)
Sodium: 137 meq/L (ref 135–145)
Total Bilirubin: 0.4 mg/dL (ref 0.2–1.2)
Total Protein: 6.6 g/dL (ref 6.0–8.3)

## 2024-01-01 LAB — SEDIMENTATION RATE: Sed Rate: 15 mm/h (ref 0–30)

## 2024-01-01 LAB — CBC
HCT: 41.8 % (ref 36.0–46.0)
Hemoglobin: 13.5 g/dL (ref 12.0–15.0)
MCHC: 32.4 g/dL (ref 30.0–36.0)
MCV: 79 fl (ref 78.0–100.0)
Platelets: 117 K/uL — ABNORMAL LOW (ref 150.0–400.0)
RBC: 5.28 Mil/uL — ABNORMAL HIGH (ref 3.87–5.11)
RDW: 17.4 % — ABNORMAL HIGH (ref 11.5–15.5)
WBC: 8.5 K/uL (ref 4.0–10.5)

## 2024-01-01 LAB — IBC + FERRITIN
Ferritin: 267.6 ng/mL (ref 10.0–291.0)
Iron: 47 ug/dL (ref 42–145)
Saturation Ratios: 14.1 % — ABNORMAL LOW (ref 20.0–50.0)
TIBC: 333.2 ug/dL (ref 250.0–450.0)
Transferrin: 238 mg/dL (ref 212.0–360.0)

## 2024-01-01 LAB — LIPID PANEL
Cholesterol: 193 mg/dL (ref 0–200)
HDL: 28.5 mg/dL — ABNORMAL LOW (ref >39.00)
NonHDL: 164.05
Total CHOL/HDL Ratio: 7
Triglycerides: 612 mg/dL — ABNORMAL HIGH (ref 0.0–149.0)
VLDL: 122.4 mg/dL — ABNORMAL HIGH (ref 0.0–40.0)

## 2024-01-01 LAB — VITAMIN D 25 HYDROXY (VIT D DEFICIENCY, FRACTURES): VITD: 42.96 ng/mL (ref 30.00–100.00)

## 2024-01-01 LAB — TSH: TSH: 2.86 u[IU]/mL (ref 0.35–5.50)

## 2024-01-01 LAB — C-REACTIVE PROTEIN: CRP: 1.3 mg/dL (ref 0.5–20.0)

## 2024-01-01 LAB — VITAMIN B12: Vitamin B-12: 645 pg/mL (ref 211–911)

## 2024-01-01 LAB — CK: Total CK: 40 U/L (ref 17–177)

## 2024-01-01 LAB — LDL CHOLESTEROL, DIRECT: Direct LDL: 106 mg/dL

## 2024-01-01 LAB — FOLATE: Folate: >22.9 ng/mL (ref >5.9)

## 2024-01-01 LAB — HEMOGLOBIN A1C: Hgb A1c MFr Bld: 6.3 % (ref 4.6–6.5)

## 2024-01-01 MED ORDER — CYCLOBENZAPRINE HCL 10 MG PO TABS: 10.0000 mg | ORAL_TABLET | Freq: Three times a day (TID) | ORAL | 0 refills | Status: AC | PRN

## 2024-01-01 NOTE — Assessment & Plan Note (Signed)
 Stable on Zoloft  150 mg daily and clonazepam  3 times daily as needed.

## 2024-01-01 NOTE — Assessment & Plan Note (Signed)
 Has upcoming appoint with rheumatology pending.

## 2024-01-01 NOTE — Progress Notes (Signed)
 Chief Complaint:  Melinda Porter is a 53 y.o. female who presents today for her annual comprehensive physical exam.    Assessment/Plan:  New/Acute Problems: Rash  She is followed with dermatology.  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  We are checking labs today.  She has not done well with systemic steroids in the past we will hold off on starting this today.  She will follow-up with dermatology as previously planned and also rheumatology soon.   Chronic Problems Addressed Today: Systemic lupus erythematosus (HCC) She has been referred to see rheumatology however not able to see them for another few weeks.  She is not currently on any medication for her lupus.  Concerned that she is dealing with a flareup given her musculoskeletal pain and rashes.  Will check labs today and defer further management to rheumatology.  She has not tolerated systemic steroids in the past.  Psoriatic arthritis [facility]) Has upcoming appoint with rheumatology pending.  Ulcerative colitis (HCC) Continue management per GI.  She is on mesalamine  2.4 g daily.  Depression, major, single episode, moderate (HCC) Stable on Zoloft  150 mg daily.  Anxiety Stable on Zoloft  150 mg daily and clonazepam  3 times daily as needed.  Vitamin D  deficiency Check vitamin D .  Hypertension Blood pressure at goal today on losartan  100 mg daily.  Hyperlipemia Check lites.  Iron deficiency anemia Checks BC and iron panel.  Vitamin B12 deficiency Check B12.  Prediabetes Check A1c.  Sjogren's syndrome [facility]) Referral to rheumatology is pending.  Recurrent low back pain Likely related to her underlying psoriatic arthritis and lupus.  Will refill her Flexeril  today as she has done well with this in the past.  Also has Celebrex  to use as needed.  Hopefully will have some improvement with treatment for her above issues so if this  continues to be an issue would consider referral to orthopedics.   Preventative Healthcare: Flu shot given today.Check labs.  Due for colonoscopy in 2030.  Patient Counseling(The following topics were reviewed and/or handout was given):  -Nutrition: Stressed importance of moderation in sodium/caffeine intake, saturated fat and cholesterol, caloric balance, sufficient intake of fresh fruits, vegetables, and fiber.  -Stressed the importance of regular exercise.   -Substance Abuse: Discussed cessation/primary prevention of tobacco, alcohol, or other drug use; driving or other dangerous activities under the influence; availability of treatment for abuse.   -Injury prevention: Discussed safety belts, safety helmets, smoke detector, smoking near bedding or upholstery.   -Sexuality: Discussed sexually transmitted diseases, partner selection, use of condoms, avoidance of unintended pregnancy and contraceptive alternatives.   -Dental health: Discussed importance of regular tooth brushing, flossing, and dental visits.  -Health maintenance and immunizations reviewed. Please refer to Health maintenance section.  Return to care in 1 year for next preventative visit.     Subjective:  HPI:  Patient is here today for her  annual physical.  See assessment / plan for status of chronic conditions.  Discussed the use of AI scribe software for clinical note transcription with the patient, who gave verbal consent to proceed.  History of Present Illness Melinda Porter is a 53 year old female with lupus who presents for an annual physical exam.  She has a persistent rash on her back, which was previously biopsied by a dermatologist; she was told the biopsy showed an allergy , but no specific allergen was identified. She is hesitant to undergo further biopsies due to  pain and prolonged healing time and seeks a referral to an allergy  specialist for further evaluation.  She experiences worsening vision, which she  attributes to not receiving necessary injections. She has been unable to find an eye doctor who accepts her insurance and is in the process of obtaining a referral.  She has swelling in her foot persisting for several months. She has been prescribed a diuretic but does not take it regularly. She is scheduled to see a rheumatologist in October. No improvement in foot swelling with diuretics.  She describes severe fatigue, stating she feels as if 'somebody has stuck a vacuum hose in my mouth and sucked every bit of life out.' She also reports unexplained bruising and scars.  She has a history of lupus and describes nerve pain in her face, which is alleviated by applying a cold compress. She experiences intense pain if the compress is not cold.  She reports back pain that radiates to her chest, describing it as feeling like 'somebody was sticking their hand up inside my back and just grabbing a hold of it and twist it.' This pain has been longstanding, but the chest involvement is new. She has been taking Aleve for pain relief.  She mentions a history of psoriasis, with lesions present on her hands and behind her ears. She also notes new lesions on her legs, which she attributes to her autoimmune condition.  She is not currently taking her prescribed shots for lupus due to a lack of access to a rheumatologist. She takes zinc, potassium, and iron supplements but finds the iron supplement unpalatable.  She works in a job that requires wearing a headset for ten hours a day.        09/11/2023    8:50 AM  Depression screen PHQ 2/9  Decreased Interest 0  Down, Depressed, Hopeless 0  PHQ - 2 Score 0  Altered sleeping 0  Tired, decreased energy 0  Change in appetite 0  Feeling bad or failure about yourself  0  Trouble concentrating 0  Moving slowly or fidgety/restless 0  Suicidal thoughts 0  PHQ-9 Score 0  Difficult doing work/chores Not difficult at all    Health Maintenance Due  Topic Date  Due   Pneumococcal Vaccine: 50+ Years (2 of 2 - PCV20 or PCV21) 07/14/2022   DTaP/Tdap/Td (2 - Td or Tdap) 07/22/2023   Influenza Vaccine  11/14/2023     ROS: Per HPI, otherwise a complete review of systems was negative.   PMH:  The following were reviewed and entered/updated in epic: Past Medical History:  Diagnosis Date   Allergy     Anemia    Anxiety    Arthritis    Back pain    Bilateral swelling of feet    Cataract    not a surgical candidate at this time (08/22/2022)   Depression    Endometriosis    Fatty liver    Fibromyalgia    GERD (gastroesophageal reflux disease)    Hiatal hernia    Hyperlipemia    Hypertension    IBS (irritable bowel syndrome)    Joint pain    Lupus    Macular degeneration of both eyes    PONV (postoperative nausea and vomiting)    Psoriatic arthritis (HCC)    PTSD (post-traumatic stress disorder)    Seasonal allergies    Sjogren's disease (HCC)    Sleep-disordered breathing 11/12/2021   Stroke (HCC) 2016   UC (ulcerative colitis) (HCC)    Vitamin  D deficiency    Patient Active Problem List   Diagnosis Date Noted   Peripheral edema 08/29/2023   Macular degeneration, 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/2023   Recurrent low back pain 09/20/2022   Hepatic steatosis 04/24/2022   Multiple skin nodules 04/24/2022   Thrombocytopenia (HCC) 04/05/2022   Splenomegaly 04/05/2022   Sleep-disordered breathing 11/12/2021   Elevated lipase 10/23/2021   Prediabetes 01/01/2021   Morbid obesity (HCC) 07/10/2020   Depression, major, single episode, moderate (HCC) 05/29/2020   Anxiety 05/29/2020   PTSD (post-traumatic stress disorder) 05/29/2020   Vitamin D  deficiency 05/29/2020   Iron deficiency anemia 05/29/2020   GERD (gastroesophageal reflux disease) 05/29/2020   Sjogren's syndrome (HCC) 05/29/2020   Seasonal allergies 05/29/2020   Vitamin B12 deficiency 05/29/2020   Cold sore 05/29/2020   Systemic lupus erythematosus (HCC) 12/29/2019   Ulcerative colitis  (HCC) 01/05/2017   Persistent headaches 11/05/2014   Facial droop 11/05/2014   Syncope 11/05/2014   Hypertension 11/05/2014   Hyperlipemia 11/05/2014   Psoriatic arthritis (HCC) 11/05/2014   Hypoglycemia 04/13/2014   Past Surgical History:  Procedure Laterality Date   CESAREAN SECTION  1997   COLONOSCOPY  2021   JP-MAC-suprep(exc)-patchy erythema otherwise normal - recall 5 years   FLEXIBLE SIGMOIDOSCOPY N/A 01/06/2017   Procedure: FLEXIBLE SIGMOIDOSCOPY;  Surgeon: Leigh Elspeth SQUIBB, MD;  Location: [facility];  Service: Gastroenterology;  Laterality: N/A;   MOLE REMOVAL     Right forearm   OOPHORECTOMY Bilateral    PARTIAL HYSTERECTOMY  07/2009   TEMPOROMANDIBULAR JOINT SURGERY Right 2009   TONSILLECTOMY  2009    Family History  Problem Relation Age of Onset   Colon polyps Maternal Grandmother    Lung cancer Maternal Grandmother    Liver disease Father        agent orange   Alcoholism Father    Thyroid  disease Mother    Diabetes Maternal Grandfather    Heart disease Maternal Grandfather    Hypertension Maternal Grandfather    Colon cancer Maternal Uncle        great uncle   Breast cancer Neg Hx    Esophageal cancer Neg Hx    Stomach cancer Neg Hx    Rectal cancer Neg Hx     Medications- reviewed and updated Current Outpatient Medications  Medication Sig Dispense Refill   Apremilast 30 MG TABS Take 1 tablet by mouth in the morning and at bedtime.     Ascorbic Acid (VITA-C PO) Take 3 tablets by mouth daily at 6 (six) AM. 3 gummies a day     celecoxib  (CELEBREX ) 200 MG capsule Take 1 capsule (200 mg total) by mouth 2 (two) times daily as needed for moderate pain (pain score 4-6). 180 capsule 0   clonazePAM  (KLONOPIN ) 1 MG tablet Take 1 tablet (1 mg total) by mouth 3 (three) times daily as needed for anxiety. 270 tablet 1   cromolyn  (OPTICROM ) 4 % ophthalmic solution Place 1 drop into both eyes 4 (four) times daily. (Patient taking differently: Place 1 drop into  both eyes 4 (four) times daily as needed.) 10 mL 1   diphenoxylate -atropine  (LOMOTIL ) 2.5-0.025 MG tablet Take 1 tablet by mouth 2 (two) times daily as needed for diarrhea or loose stools. 60 tablet 2   fluticasone  (FLONASE ) 50 MCG/ACT nasal spray Place 2 sprays into both nostrils daily. 16 g 6   folic acid  (FOLVITE ) 1 MG tablet Take 1 tablet (1 mg total) by mouth daily.     furosemide  (LASIX ) 20 MG  tablet Take 1 tablet (20 mg total) by mouth daily as needed for edema or fluid. 30 tablet 3   glucose 4 GM chewable tablet Chew 1 tablet (4 g total) by mouth as needed for low blood sugar. 50 tablet 1   halobetasol (ULTRAVATE) 0.05 % cream Apply 1 Application topically daily as needed.     losartan  (COZAAR ) 100 MG tablet TAKE 1 TABLET(100 MG) BY MOUTH DAILY 90 tablet 1   meclizine  (ANTIVERT ) 25 MG tablet Take 1 tablet (25 mg total) by mouth 3 (three) times daily as needed for dizziness. 30 tablet 0   Mesalamine  (ASACOL  HD) 800 MG TBEC Take 3 tablets (2,400 mg total) by mouth daily. 180 tablet 1   metroNIDAZOLE  (METROGEL ) 0.75 % gel Apply 1 Application topically at bedtime.     mupirocin  ointment (BACTROBAN ) 2 % Apply 1 Application topically 2 (two) times daily. 22 g 0   ondansetron  (ZOFRAN ) 8 MG tablet Take 1 tablet (8 mg total) by mouth every 8 (eight) hours as needed for nausea or vomiting. 20 tablet 0   pantoprazole  (PROTONIX ) 40 MG tablet Take 1 tablet (40 mg total) by mouth daily. 90 tablet 3   Potassium 95 MG TABS Take 1 tablet by mouth daily at 6 (six) AM.     sertraline  (ZOLOFT ) 100 MG tablet Take 1.5 tablets (150 mg total) by mouth daily. 135 tablet 3   topiramate  (TOPAMAX ) 100 MG tablet Take 1 tablet (100 mg total) by mouth 2 (two) times daily. TAKE 1 TABLET(100 MG) BY MOUTH TWICE DAILY. 180 tablet 3   triamcinolone  (NASACORT ) 55 MCG/ACT AERO nasal inhaler Place 1 spray into the nose daily. Start with 1 spray each side twice a day for 3 days, then reduce to daily. 1 each 2    trimethoprim -polymyxin b  (POLYTRIM ) ophthalmic solution Place 2 drops into the left eye every 4 (four) hours. 10 mL 0   valACYclovir  (VALTREX ) 1000 MG tablet Take 2 tablets (2,000 mg total) by mouth as needed. For two doses for cold sores. 20 tablet 5   Vitamin D , Ergocalciferol , (DRISDOL ) 1.25 MG (50000 UNIT) CAPS capsule Take 1 capsule (50,000 Units total) by mouth every 7 (seven) days. 12 capsule 3   zinc gluconate 50 MG tablet Take 50 mg by mouth daily.     cyclobenzaprine  (FLEXERIL ) 10 MG tablet Take 1 tablet (10 mg total) by mouth 3 (three) times daily as needed for muscle spasms. 60 tablet 0   No current facility-administered medications for this visit.    Allergies-reviewed and updated Allergies  Allergen Reactions   Gabapentin  Other (See Comments)    Stroke like symptoms   Lyrica [Pregabalin] Shortness Of Breath   Prednisone  Other (See Comments)    I get deathly ill   Remicade  [Infliximab ] Anaphylaxis   Ciprofloxacin  Nausea Only   Morphine Sulfate Itching    Other reaction(s): Unknown   Nsaids Other (See Comments)    Liver problems- can't take tylenol , ibuprofen  etc; MD instructed patient to take Aleve?; patient states she tries to take Cymbalta  instead if she is really hurting   Sulfa  Antibiotics Rash and Other (See Comments)   Tolmetin Other (See Comments)    Other reaction(s): Other (See Comments) Liver problems- can't take tylenol , ibuprofen  etc.   Codeine Rash    Other reaction(s): Unknown   Humira [Adalimumab] Rash    Worsening of Lupus rash.   Morphine And Codeine Rash    Wired up   Penicillins Rash    Has patient  had a PCN reaction causing immediate rash, facial/tongue/throat swelling, SOB or lightheadedness with hypotension: NO Has patient had a PCN reaction causing severe rash involving mucus membranes or skin necrosis: NO Has patient had a PCN reaction that required hospitalization NO Has patient had a PCN reaction occurring within the last 10 years:  NO If all of the above answers are NO, then may proceed with Cephalosporin use.    Social History   Socioeconomic History   Marital status: Widowed    Spouse name: Not on file   Number of children: 1   Years of education: Not on file   Highest education level: 12th grade  Occupational History   Occupation: work from home [employer]  Tobacco Use   Smoking status: Former    Current packs/day: 0.00    Types: Cigarettes    Quit date: 03/09/1999    Years since quitting: 24.8   Smokeless tobacco: Never  Vaping Use   Vaping status: Never Used  Substance and Sexual Activity   Alcohol use: No    Alcohol/week: 0.0 standard drinks of alcohol   Drug use: No   Sexual activity: Not Currently    Birth control/protection: Surgical    Comment: Hysterectomy  Other Topics Concern   Not on file  Social History Narrative   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   Social Drivers of Health   Financial Resource Strain: Medium Risk (12/30/2023)   Overall Financial Resource Strain (CARDIA)    Difficulty of Paying Living Expenses: Somewhat hard  Food Insecurity: Food Insecurity Present (12/30/2023)   Hunger Vital Sign    Worried About Running Out of Food in the Last Year: Sometimes true    Ran Out of Food in the Last Year: Never true  Transportation Needs: No Transportation Needs (12/30/2023)   PRAPARE - Administrator, Civil Service (Medical): No    Lack of Transportation (Non-Medical): No  Physical Activity: Inactive (12/30/2023)   Exercise Vital Sign    Days of Exercise per Week: 0 days    Minutes of Exercise per Session: Not on file  Stress: Stress Concern Present (12/30/2023)   [employer] of Occupational Health - Occupational Stress Questionnaire    Feeling of Stress: Rather much  Social Connections: Socially Isolated (12/30/2023)   Social Connection and Isolation Panel    Frequency of Communication with Friends and Family: More than three times a week    Frequency of Social Gatherings  with Friends and Family: Once a week    Attends Religious Services: Patient declined    Database administrator or Organizations: No    Attends Banker Meetings: Not on file    Marital Status: Widowed        Objective:  Physical Exam: BP 118/82   Pulse 90   Temp 98.8 F (37.1 C) (Temporal)   Ht 5' 4 (1.626 m)   Wt 247 lb (112 kg)   LMP 06/23/2009   SpO2 96%   BMI 42.40 kg/m   Body mass index is 42.4 kg/m. Wt Readings from Last 3 Encounters:  01/01/24 247 lb (112 kg)  09/19/23 243 lb (110.2 kg)  09/11/23 245 lb 3.2 oz (111.2 kg)   Gen: NAD, resting comfortably HEENT: TMs normal bilaterally. OP clear. No thyromegaly noted.  CV: RRR with no murmurs appreciated Pulm: NWOB, CTAB with no crackles, wheezes, or rhonchi GI: Normal bowel sounds present. Soft, Nontender, Nondistended. MSK: no edema, cyanosis, or clubbing noted Skin:   Extremities with scattered petechia  and small 2 to 3 mm erythematous lesions Neuro: CN2-12 grossly intact. Strength 5/5 in upper and lower extremities. Reflexes symmetric and intact bilaterally.  Psych: Normal affect and thought content     Beck Cofer M. Kennyth, MD 01/01/2024 11:26 AM

## 2024-01-01 NOTE — Assessment & Plan Note (Signed)
Stable on Zoloft 150 mg daily. 

## 2024-01-01 NOTE — Patient Instructions (Signed)
 It was very nice to see you today!  VISIT SUMMARY: You came in for your annual physical exam and discussed several ongoing health issues, including lupus, vision problems, foot swelling, fatigue, back pain, and psoriasis. We have made plans for further evaluations and treatments to help manage these conditions.  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 The rash might be related to lupus, but the biopsy showed an allergy . Your facial pain improves with a cold compress. -We will do blood work to check your inflammation and lupus activity. -You have a referral to see Dr. Lonni Ester, a rheumatologist, for further management. -We discussed the potential use of muscle relaxers for pain management.  CHRONIC BACK PAIN: Your chronic back pain has worsened and now radiates to your chest. Aleve has provided some relief. -We have prescribed Flexeril  to help manage your back pain.  CHRONIC FOOT SWELLING: Your foot swelling has not improved with diuretics and may be related to your autoimmune condition. -You are scheduled to see a rheumatologist in October for further evaluation.  CHRONIC FATIGUE: You are experiencing severe fatigue. -We will do blood work to check your vitamin levels, including B12, folate, vitamin D , and iron.  VISION IMPAIRMENT: Your vision is worsening because you have not received necessary injections. -We are processing a referral to an eye specialist for you.  PSORIASIS: You have psoriasis lesions on your hands, behind your ears, and new lesions on your legs. -We will process a referral to a dermatologist for further evaluation.  ADULT WELLNESS VISIT: This was your routine adult wellness visit. -flu shot today  Return in about 1 year (around 12/31/2024) for Annual Physical.   Take care, Dr Kennyth  PLEASE NOTE:  If you had any  lab tests, please let us  know if you have not heard back within a few days. You may see your results on mychart before we have a chance to review them but we will give you a call once they are reviewed by us .   If we ordered any referrals today, please let us  know if you have not heard from their office within the next week.   If you had any urgent prescriptions sent in today, please check with the pharmacy within an hour of our visit to make sure the prescription was transmitted appropriately.   Please try these tips to maintain a healthy lifestyle:  Eat at least 3 REAL meals and 1-2 snacks per day.  Aim for no more than 5 hours between eating.  If you eat breakfast, please do so within one hour of getting up.   Each meal should contain half fruits/vegetables, one quarter protein, and one quarter carbs (no bigger than a computer mouse)  Cut down on sweet beverages. This includes juice, soda, and sweet tea.   Drink at least 1 glass of water with each meal and aim for at least 8 glasses per day  Exercise at least 150 minutes every week.    Preventive Care 11-65 Years Old, Female Preventive care refers to lifestyle choices and visits with your health care provider that can promote health and wellness. Preventive care visits are also called wellness exams. What can I expect for my preventive care visit? Counseling Your health care provider may ask you questions about your: Medical history, including: Past medical problems. Family medical history. Pregnancy history. Current health, including: Menstrual cycle. Method of  birth control. Emotional well-being. Home life and relationship well-being. Sexual activity and sexual health. Lifestyle, including: Alcohol, nicotine or tobacco, and drug use. Access to firearms. Diet, exercise, and sleep habits. Work and work Astronomer. Sunscreen use. Safety issues such as seatbelt and bike helmet use. Physical exam Your health care provider  will check your: Height and weight. These may be used to calculate your BMI (body mass index). BMI is a measurement that tells if you are at a healthy weight. Waist circumference. This measures the distance around your waistline. This measurement also tells if you are at a healthy weight and may help predict your risk of certain diseases, such as type 2 diabetes and high blood pressure. Heart rate and blood pressure. Body temperature. Skin for abnormal spots. What immunizations do I need?  Vaccines are usually given at various ages, according to a schedule. Your health care provider will recommend vaccines for you based on your age, medical history, and lifestyle or other factors, such as travel or where you work. What tests do I need? Screening Your health care provider may recommend screening tests for certain conditions. This may include: Lipid and cholesterol levels. Diabetes screening. This is done by checking your blood sugar (glucose) after you have not eaten for a while (fasting). Pelvic exam and Pap test. Hepatitis B test. Hepatitis C test. HIV (human immunodeficiency virus) test. STI (sexually transmitted infection) testing, if you are at risk. Lung cancer screening. Colorectal cancer screening. Mammogram. Talk with your health care provider about when you should start having regular mammograms. This may depend on whether you have a family history of breast cancer. BRCA-related cancer screening. This may be done if you have a family history of breast, ovarian, tubal, or peritoneal cancers. Bone density scan. This is done to screen for osteoporosis. Talk with your health care provider about your test results, treatment options, and if necessary, the need for more tests. Follow these instructions at home: Eating and drinking  Eat a diet that includes fresh fruits and vegetables, whole grains, lean protein, and low-fat dairy products. Take vitamin and mineral supplements as  recommended by your health care provider. Do not drink alcohol if: Your health care provider tells you not to drink. You are pregnant, may be pregnant, or are planning to become pregnant. If you drink alcohol: Limit how much you have to 0-1 drink a day. Know how much alcohol is in your drink. In the [place] one drink equals one 12 oz bottle of beer (355 mL), one 5 oz glass of wine (148 mL), or one 1 oz glass of hard liquor (44 mL). Lifestyle Brush your teeth every morning and night with fluoride toothpaste. Floss one time each day. Exercise for at least 30 minutes 5 or more days each week. Do not use any products that contain nicotine or tobacco. These products include cigarettes, chewing tobacco, and vaping devices, such as e-cigarettes. If you need help quitting, ask your health care provider. Do not use drugs. If you are sexually active, practice safe sex. Use a condom or other form of protection to prevent STIs. If you do not wish to become pregnant, use a form of birth control. If you plan to become pregnant, see your health care provider for a prepregnancy visit. Take aspirin only as told by your health care provider. Make sure that you understand how much to take and what form to take. Work with your health care provider to find out whether it is safe and beneficial  for you to take aspirin daily. Find healthy ways to manage stress, such as: Meditation, yoga, or listening to music. Journaling. Talking to a trusted person. Spending time with friends and family. Minimize exposure to UV radiation to reduce your risk of skin cancer. Safety Always wear your seat belt while driving or riding in a vehicle. Do not drive: If you have been drinking alcohol. Do not ride with someone who has been drinking. When you are tired or distracted. While texting. If you have been using any mind-altering substances or drugs. Wear a helmet and other protective equipment during sports activities. If you  have firearms in your house, make sure you follow all gun safety procedures. Seek help if you have been physically or sexually abused. What's next? Visit your health care provider once a year for an annual wellness visit. Ask your health care provider how often you should have your eyes and teeth checked. Stay up to date on all vaccines. This information is not intended to replace advice given to you by your health care provider. Make sure you discuss any questions you have with your health care provider. Document Revised: 09/27/2020 Document Reviewed: 09/27/2020 Elsevier Patient Education  2024 [employer].

## 2024-01-01 NOTE — Assessment & Plan Note (Signed)
Blood pressure at goal today on losartan 100 mg daily. 

## 2024-01-01 NOTE — Assessment & Plan Note (Signed)
 Likely related to her underlying psoriatic arthritis and lupus.  Will refill her Flexeril  today as she has done well with this in the past.  Also has Celebrex  to use as needed.  Hopefully will have some improvement with treatment for her above issues so if this continues to be an issue would consider referral to orthopedics.

## 2024-01-01 NOTE — Assessment & Plan Note (Signed)
 Check vitamin D.

## 2024-01-01 NOTE — Assessment & Plan Note (Signed)
 Check lites.

## 2024-01-01 NOTE — Assessment & Plan Note (Signed)
Referral to rheumatology is pending.

## 2024-01-01 NOTE — Assessment & Plan Note (Signed)
 Checks BC and iron panel.

## 2024-01-01 NOTE — Assessment & Plan Note (Signed)
 Check B12

## 2024-01-01 NOTE — Assessment & Plan Note (Signed)
 Check A1c.

## 2024-01-01 NOTE — Assessment & Plan Note (Signed)
 She has been referred to see rheumatology however not able to see them for another few weeks.  She is not currently on any medication for her lupus.  Concerned that she is dealing with a flareup given her musculoskeletal pain and rashes.  Will check labs today and defer further management to rheumatology.  She has not tolerated systemic steroids in the past.

## 2024-01-01 NOTE — Assessment & Plan Note (Signed)
 Continue management per GI.  She is on mesalamine  2.4 g daily.

## 2024-01-02 ENCOUNTER — Ambulatory Visit: Payer: Self-pay | Admitting: Family Medicine

## 2024-01-02 LAB — C4 COMPLEMENT: C4 Complement: 24 mg/dL (ref 15–57)

## 2024-01-02 LAB — C3 COMPLEMENT: C3 Complement: 176 mg/dL (ref 83–193)

## 2024-01-02 NOTE — Telephone Encounter (Signed)
**Note De-identified  [place] Obfuscation** Please advise 

## 2024-01-02 NOTE — Progress Notes (Signed)
 Her platelets are little low.  We should recheck this again in a few months.  This is probably due to her lupus and autoimmune conditions.  Her triglycerides are elevated but her other cholesterol levels are stable.  We can recheck this again in 3 to 6 months.  It is important she continue to work on healthy diet with high-fiber and plenty of fruits and vegetables.  Her A1c is elevated into the prediabetic range.  Do not need to start meds for this but she should continue to work on diet and exercise and we can recheck this again in a year or so.  All of her other labs are stable and we can recheck in a year.

## 2024-01-05 NOTE — Telephone Encounter (Signed)
 Like we discussed at her office visit, I am concerned that the rash on her legs is related to her lupus.  We discussed starting prednisone  however she has had bad reactions to this in the past.  Recommend she discuss this further with rheumatology however we can try a topical steroid cream if she wishes.  It is ok for her to get her vaccines at the pharmacy.

## 2024-01-12 ENCOUNTER — Telehealth: Payer: Self-pay | Admitting: *Deleted

## 2024-01-12 DIAGNOSIS — T7840XS Allergy, unspecified, sequela: Secondary | ICD-10-CM

## 2024-01-12 DIAGNOSIS — R21 Rash and other nonspecific skin eruption: Secondary | ICD-10-CM

## 2024-01-12 NOTE — Telephone Encounter (Signed)
 Ok to placed allergy  referral?  Dermatology referral placed

## 2024-01-12 NOTE — Telephone Encounter (Signed)
 Copied from CRM [id number]. Topic: General - Other >> Jan 09, 2024  2:25 PM Thersia BROCKS wrote: Reason for CRM: Patient called in regarding her referrals for the Derm, Eye Doctor and Allergy  Doctor would like for someone to give her a callback to followup with this

## 2024-01-12 NOTE — Telephone Encounter (Signed)
 Ok with me. Please place any necessary orders.

## 2024-01-12 NOTE — Telephone Encounter (Signed)
 Copied from CRM [id number]. Topic: General - Other >> Jan 09, 2024  2:25 PM Thersia BROCKS wrote: Reason for CRM: Patient called in regarding her referrals for the Derm, Eye Doctor and Allergy  Doctor would like for someone to give her a callback to followup with this   .Left message to return call to our office at their convenience.  Aahil Fredin,RMA

## 2024-01-12 NOTE — Telephone Encounter (Signed)
 Referral placed.

## 2024-01-12 NOTE — Telephone Encounter (Signed)
 Copied from CRM [id number]. Topic: General - Other >> Jan 12, 2024  2:40 PM Charlet HERO wrote: Reason for CRM: patient returning call to Dr Carroll nurse [place]. Please call the patient back or sen message on my chart.

## 2024-01-20 ENCOUNTER — Encounter: Payer: Self-pay | Admitting: Family Medicine

## 2024-01-20 NOTE — Telephone Encounter (Signed)
 Spoke with patient, advise to give them a call for appt  Let us  know if referral needed  Verbalized understanding

## 2024-01-20 NOTE — Telephone Encounter (Signed)
 Left message to return call to our office at their convenience.

## 2024-01-20 NOTE — Telephone Encounter (Signed)
 Pt returned call to [facility]. Just wants to make sure the referral is corrected according to previous msg.

## 2024-02-10 ENCOUNTER — Encounter: Payer: Self-pay | Admitting: Internal Medicine

## 2024-02-10 ENCOUNTER — Ambulatory Visit: Attending: Internal Medicine | Admitting: Internal Medicine

## 2024-02-10 VITALS — BP 153/54 | HR 77 | Temp 98.2°F | Resp 16 | Ht 63.0 in | Wt 252.0 lb

## 2024-02-10 DIAGNOSIS — L4050 Arthropathic psoriasis, unspecified: Secondary | ICD-10-CM

## 2024-02-10 DIAGNOSIS — K51919 Ulcerative colitis, unspecified with unspecified complications: Secondary | ICD-10-CM

## 2024-02-10 DIAGNOSIS — M3500 Sicca syndrome, unspecified: Secondary | ICD-10-CM | POA: Diagnosis not present

## 2024-02-10 DIAGNOSIS — M3219 Other organ or system involvement in systemic lupus erythematosus: Secondary | ICD-10-CM

## 2024-02-10 DIAGNOSIS — Z79899 Other long term (current) drug therapy: Secondary | ICD-10-CM | POA: Insufficient documentation
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
## 2024-02-12 LAB — QUANTIFERON-TB GOLD PLUS
Mitogen-NIL: >10 [IU]/mL
NIL: 0.02 [IU]/mL
QuantiFERON-TB Gold Plus: NEGATIVE
TB1-NIL: 0 [IU]/mL
TB2-NIL: 0 [IU]/mL

## 2024-02-12 LAB — CBC WITH DIFFERENTIAL/PLATELET
Absolute Lymphocytes: 1809 {cells}/uL (ref 850–3900)
Absolute Monocytes: 395 {cells}/uL (ref 200–950)
Basophils Absolute: 87 {cells}/uL (ref 0–200)
Basophils Relative: 1.1 %
Eosinophils Absolute: 348 {cells}/uL (ref 15–500)
Eosinophils Relative: 4.4 %
HCT: 41.6 % (ref 35.0–45.0)
Hemoglobin: 13.3 g/dL (ref 11.7–15.5)
MCH: 26 pg — ABNORMAL LOW (ref 27.0–33.0)
MCHC: 32 g/dL (ref 32.0–36.0)
MCV: 81.4 fL (ref 80.0–100.0)
MPV: 11.7 fL (ref 7.5–12.5)
Monocytes Relative: 5 %
Neutro Abs: 5261 {cells}/uL (ref 1500–7800)
Neutrophils Relative %: 66.6 %
Platelets: 102 Thousand/uL — ABNORMAL LOW (ref 140–400)
RBC: 5.11 Million/uL — ABNORMAL HIGH (ref 3.80–5.10)
RDW: 16.1 % — ABNORMAL HIGH (ref 11.0–15.0)
Total Lymphocyte: 22.9 %
WBC: 7.9 Thousand/uL (ref 3.8–10.8)

## 2024-02-12 LAB — SJOGRENS SYNDROME-B EXTRACTABLE NUCLEAR ANTIBODY: SSB (La) (ENA) Antibody, IgG: <1 AI

## 2024-02-12 LAB — C3 AND C4
C3 Complement: 165 mg/dL (ref 83–193)
C4 Complement: 22 mg/dL (ref 15–57)

## 2024-02-12 LAB — SJOGRENS SYNDROME-A EXTRACTABLE NUCLEAR ANTIBODY: SSA (Ro) (ENA) Antibody, IgG: >8 AI — AB

## 2024-02-12 LAB — ANTI-DNA ANTIBODY, DOUBLE-STRANDED: ds DNA Ab: <1 [IU]/mL

## 2024-02-12 LAB — SEDIMENTATION RATE: Sed Rate: 9 mm/h (ref 0–30)

## 2024-02-13 ENCOUNTER — Ambulatory Visit: Payer: Self-pay | Admitting: Internal Medicine

## 2024-02-19 ENCOUNTER — Encounter: Admitting: Family Medicine

## 2024-02-19 ENCOUNTER — Other Ambulatory Visit: Payer: Self-pay | Admitting: Family Medicine

## 2024-02-19 DIAGNOSIS — I10 Essential (primary) hypertension: Secondary | ICD-10-CM

## 2024-02-23 ENCOUNTER — Encounter: Payer: Self-pay | Admitting: Internal Medicine

## 2024-02-24 ENCOUNTER — Other Ambulatory Visit: Payer: Self-pay

## 2024-02-24 MED ORDER — FERROUS SULFATE 325 (65 FE) MG PO TABS: 325.0000 mg | ORAL_TABLET | Freq: Every day | ORAL | 1 refills | Status: AC

## 2024-02-25 ENCOUNTER — Encounter: Payer: Self-pay | Admitting: Family Medicine

## 2024-02-25 NOTE — Telephone Encounter (Signed)
Please advse 

## 2024-02-26 ENCOUNTER — Ambulatory Visit: Admitting: Family Medicine

## 2024-02-26 NOTE — Telephone Encounter (Signed)
 Patient is scheduled to come in the twenty first of November.

## 2024-02-26 NOTE — Telephone Encounter (Signed)
 Can we have her come in for an office visit?

## 2024-02-26 NOTE — Telephone Encounter (Signed)
 Called and left a message for patient to call our office back to inform her of Dr. Carroll note and to schedule her an appointment with Dr. Kennyth. MyChart message also sent informing her of the same. Patient will be called once again if no response.

## 2024-03-05 ENCOUNTER — Ambulatory Visit: Admitting: Family Medicine

## 2024-03-05 NOTE — Telephone Encounter (Signed)
 Please advise on sending referrals for patient. Please and thank you.

## 2024-03-08 NOTE — Telephone Encounter (Signed)
 Ok to place referral to ophthalmology

## 2024-03-08 NOTE — Telephone Encounter (Signed)
 Patient notified of referral approval via MyChart.  Can we please send patient to ophthalmology per patient request and approval from Dr. Kennyth. Please and thank you.

## 2024-03-08 NOTE — Progress Notes (Deleted)
 Melinda Porter

## 2024-03-15 ENCOUNTER — Telehealth: Payer: Self-pay | Admitting: Family Medicine

## 2024-03-15 NOTE — Telephone Encounter (Signed)
 Copied from CRM [id number]. Topic: Referral - Question >> Mar 15, 2024 10:53 AM Delon DASEN wrote: Reason for CRM: Patient sent information on Dr Hester who is in network for ophthalmology, please call to advise- [phone]

## 2024-03-17 ENCOUNTER — Ambulatory Visit: Admitting: Internal Medicine

## 2024-03-17 ENCOUNTER — Other Ambulatory Visit: Payer: Self-pay | Admitting: *Deleted

## 2024-03-17 DIAGNOSIS — H353290 Exudative age-related macular degeneration, unspecified eye, stage unspecified: Secondary | ICD-10-CM

## 2024-03-17 NOTE — Telephone Encounter (Signed)
 Dr Hester office Faxed [phone] Referral faxed

## 2024-03-19 ENCOUNTER — Telehealth: Admitting: Family Medicine

## 2024-03-19 ENCOUNTER — Ambulatory Visit: Admitting: Family Medicine

## 2024-03-19 ENCOUNTER — Ambulatory Visit: Payer: Self-pay | Admitting: Internal Medicine

## 2024-03-19 ENCOUNTER — Encounter: Payer: Self-pay | Admitting: Family Medicine

## 2024-03-19 VITALS — Wt 252.0 lb

## 2024-03-19 DIAGNOSIS — F419 Anxiety disorder, unspecified: Secondary | ICD-10-CM | POA: Diagnosis not present

## 2024-03-19 DIAGNOSIS — F321 Major depressive disorder, single episode, moderate: Secondary | ICD-10-CM

## 2024-03-19 MED ORDER — SERTRALINE HCL 100 MG PO TABS: 200.0000 mg | ORAL_TABLET | Freq: Every day | ORAL | 3 refills | Status: DC

## 2024-03-19 MED ORDER — CLONAZEPAM 2 MG PO TABS: 1.0000 mg | ORAL_TABLET | Freq: Three times a day (TID) | ORAL | 3 refills | Status: AC | PRN

## 2024-03-19 NOTE — Assessment & Plan Note (Signed)
 Patient with significant worsening of depression symptoms over the last several weeks.  She has had multiple life stressors as well as physical health stressors as well.  We discussed treatment options.  She is not interested in seeing a therapist.  She has had previous bad experiences and does not wish to revisit this.  We discussed medication options as well.  Will increase her Zoloft  to 200 mg daily.  We also addressing her anxiety as below.  She will follow-up with us  in a few weeks via MyChart.

## 2024-03-19 NOTE — Progress Notes (Signed)
 Melinda Porter is a 53 y.o. female who presents today for a virtual office visit.  Assessment/Plan:  Chronic Problems Addressed Today: Depression, major, single episode, moderate (HCC) Patient with significant worsening of depression symptoms over the last several weeks.  She has had multiple life stressors as well as physical health stressors as well.  We discussed treatment options.  She is not interested in seeing a therapist.  She has had previous bad experiences and does not wish to revisit this.  We discussed medication options as well.  Will increase her Zoloft  to 200 mg daily.  We also addressing her anxiety as below.  She will follow-up with us  in a few weeks via MyChart.  Anxiety See depression A/P.  Symptoms are not controlled and have worsened is clear the last several weeks mainly due to several life stressors.  We are increasing her Zoloft  to 200 mg daily.  We are also including her clonazepam  to 1 to 2 mg 3 times daily as needed.  She will follow-up with us  in a few weeks via MyChart on can adjust as needed.    Subjective:  HPI:  See Assessment / plan for status of chronic conditions.   Discussed the use of AI scribe software for clinical note transcription with the patient, who gave verbal consent to proceed.  History of Present Illness Melinda Porter is a 54 year old female with depression and anxiety who presents with worsening symptoms.  She has been experiencing a worsening of her depression and anxiety symptoms, particularly during this time of year. 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 She feels tired and overwhelmed by various stressors, including financial pressures and family dynamics.  She has increased anxiety attacks, especially when driving at night, which she avoids due to poor night vision. Recent visits to the lupus clinic  at [facility] have exacerbated her anxiety due to the travel involved. She takes alternative routes to feel safer and avoid panic attacks.  Her current medication regimen includes Klonopin , which she has been taking since 2009, and Zoloft . She sometimes takes more than her prescribed dose of Klonopin  during panic attacks, which she finds effective in calming her down. She is reluctant to try new medications due to concerns about side effects.  She reports a history of a nervous breakdown in the fifth grade and is concerned about experiencing a similar episode. Her blood sugar levels have been elevated, with a recent high of 284, which she attributes to stress.  Family dynamics are contributing to her stress, including pressure from her sister to move in with her mother and the recent breakup of her son's engagement, which has been emotionally taxing for her.         Objective/Observations  Physical Exam: Gen: NAD, resting comfortably Pulm: Normal work of breathing Neuro: Grossly normal, moves all extremities Psych: Normal affect and thought content  Virtual Visit via Video   I connected with Melinda Porter on 03/19/24 at  8:00 AM EST by a video enabled telemedicine application and verified that I am speaking with the correct person using two identifiers. The limitations of evaluation and management by telemedicine and the availability of in person appointments were discussed. The patient expressed understanding and agreed to proceed.   Patient location: Home Provider location: [place] [employer] Persons participating in the virtual visit: Myself and Patient  Time Spent: 40 minutes of total time was spent on the date of the encounter performing the following actions: chart review prior to seeing the patient, obtaining history, performing a medically necessary exam, counseling on the treatment plan, placing orders, and documenting in our EHR.       Worth HERO. Kennyth, MD 03/19/2024  8:39 AM

## 2024-03-19 NOTE — Assessment & Plan Note (Signed)
 See depression A/P.  Symptoms are not controlled and have worsened is clear the last several weeks mainly due to several life stressors.  We are increasing her Zoloft  to 200 mg daily.  We are also including her clonazepam  to 1 to 2 mg 3 times daily as needed.  She will follow-up with us  in a few weeks via MyChart on can adjust as needed.

## 2024-03-22 ENCOUNTER — Other Ambulatory Visit: Payer: Self-pay | Admitting: Family Medicine

## 2024-03-22 NOTE — Telephone Encounter (Signed)
 Please advise on sending referral. Please and thank you.   Copied from CRM #[id number]. Topic: Referral - Status >> Mar 19, 2024 11:08 AM Laymon HERO wrote: Reason for CRM: Dr Hester office still has not received the referral. I see a referral was placed back in October thus it was for a different provider.

## 2024-03-22 NOTE — Telephone Encounter (Signed)
 Ok with me. Please place any necessary orders.

## 2024-03-22 NOTE — Telephone Encounter (Addendum)
 Can we please send a referral to Dr. Hester for dermatology. Provider and patient both in agreement. Please and thank you.  Patient notified of referral request via MyChart.

## 2024-03-23 NOTE — Addendum Note (Signed)
 Addended by: FRANCIS ROULEAU A on: 03/23/2024 04:36 PM   Modules accepted: Orders

## 2024-03-24 ENCOUNTER — Telehealth: Payer: Self-pay | Admitting: *Deleted

## 2024-03-24 NOTE — Telephone Encounter (Signed)
 Copied from CRM #[id number]. Topic: Referral - Status >> Mar 24, 2024 10:37 AM Dedra NOVAK wrote: Reason for CRM: Pt wants ophthalmology referral sent to [facility] to the attn of [facility]. Fax number is [phone]. Pls call pt if any questions.   LVM we send multiple referral to opthalmology, please give them a call for appt  [facility]

## 2024-03-25 NOTE — Telephone Encounter (Signed)
 Left message to return call to our office at their convenience.

## 2024-03-29 ENCOUNTER — Telehealth: Admitting: Physician Assistant

## 2024-03-29 DIAGNOSIS — R3989 Other symptoms and signs involving the genitourinary system: Secondary | ICD-10-CM | POA: Diagnosis not present

## 2024-03-29 MED ORDER — NITROFURANTOIN MONOHYD MACRO 100 MG PO CAPS: 100.0000 mg | ORAL_CAPSULE | Freq: Two times a day (BID) | ORAL | 0 refills | Status: AC

## 2024-03-29 NOTE — Progress Notes (Signed)

## 2024-03-30 ENCOUNTER — Telehealth: Payer: Self-pay | Admitting: *Deleted

## 2024-03-30 NOTE — Telephone Encounter (Signed)
 Copied from CRM #[id number]. Topic: General - Call Back - No Documentation >> Mar 25, 2024  4:18 PM Roselie BROCKS wrote: Reason for CRM: Patient states she is returning a call to the clinic,patient requesting a return call  LVM, returned called, LVM schedule a OV for preferred ophthalmology most office do not required referral. Let us  know if you need further assistant  [facility]

## 2024-04-10 ENCOUNTER — Encounter: Payer: Self-pay | Admitting: Family Medicine

## 2024-04-10 DIAGNOSIS — F419 Anxiety disorder, unspecified: Secondary | ICD-10-CM

## 2024-04-12 MED ORDER — FUROSEMIDE 20 MG PO TABS: 20.0000 mg | ORAL_TABLET | Freq: Every day | ORAL | 3 refills | Status: DC | PRN

## 2024-04-12 NOTE — Telephone Encounter (Signed)
 Please advise

## 2024-04-26 ENCOUNTER — Encounter: Payer: Self-pay | Admitting: Internal Medicine

## 2024-04-28 ENCOUNTER — Telehealth: Payer: Self-pay | Admitting: *Deleted

## 2024-04-28 ENCOUNTER — Other Ambulatory Visit: Payer: Self-pay | Admitting: *Deleted

## 2024-04-28 DIAGNOSIS — E559 Vitamin D deficiency, unspecified: Secondary | ICD-10-CM

## 2024-04-28 DIAGNOSIS — I10 Essential (primary) hypertension: Secondary | ICD-10-CM

## 2024-04-28 DIAGNOSIS — K219 Gastro-esophageal reflux disease without esophagitis: Secondary | ICD-10-CM

## 2024-04-28 MED ORDER — TOPIRAMATE 100 MG PO TABS: 100.0000 mg | ORAL_TABLET | Freq: Two times a day (BID) | ORAL | 1 refills | Status: AC

## 2024-04-28 MED ORDER — SERTRALINE HCL 100 MG PO TABS: 200.0000 mg | ORAL_TABLET | Freq: Every day | ORAL | 1 refills | Status: AC

## 2024-04-28 MED ORDER — VITAMIN D (ERGOCALCIFEROL) 1.25 MG (50000 UNIT) PO CAPS: 50000.0000 [IU] | ORAL_CAPSULE | ORAL | 0 refills | Status: AC

## 2024-04-28 MED ORDER — PANTOPRAZOLE SODIUM 40 MG PO TBEC: 40.0000 mg | DELAYED_RELEASE_TABLET | Freq: Every day | ORAL | 1 refills | Status: AC

## 2024-04-28 MED ORDER — FUROSEMIDE 20 MG PO TABS: 20.0000 mg | ORAL_TABLET | Freq: Every day | ORAL | 1 refills | Status: AC | PRN

## 2024-04-28 MED ORDER — LOSARTAN POTASSIUM 100 MG PO TABS: 100.0000 mg | ORAL_TABLET | Freq: Every day | ORAL | 1 refills | Status: AC

## 2024-04-28 NOTE — Telephone Encounter (Signed)
 Spoke with patient, notified pharmacy change in mychart  Pt unsure of medication refills needing at this time  Will let us  know via mychart when refills needed

## 2024-04-28 NOTE — Telephone Encounter (Signed)
 Copied from CRM #[id number]. Topic: Clinical - Prescription Issue >> Apr 28, 2024 10:42 AM Lonell PEDLAR wrote: Reason for CRM: Patient needs prescriptions for  furosemide  (LASIX ) 20 MG tablet Vitamin D , Ergocalciferol , (DRISDOL ) 1.25 MG (50000 UNIT) CAPS capsule losartan  (COZAAR ) 100 MG tablet topiramate  (TOPAMAX ) 100 MG tablet sertraline  (ZOLOFT ) 100 MG tablet pantoprazole  (PROTONIX ) 40 MG tablet  to be transferred from [facility] [address]. Please review and advise.   [facility]  [facility]

## 2024-07-09 ENCOUNTER — Other Ambulatory Visit

## 2024-07-09 ENCOUNTER — Ambulatory Visit: Admitting: Oncology

## 2024-08-18 ENCOUNTER — Ambulatory Visit: Admitting: Physician Assistant

## 2025-01-07 ENCOUNTER — Encounter: Admitting: Family Medicine
